# Patient Record
Sex: Female | Born: 1940 | Race: White | Hispanic: No | Marital: Married | Smoking: Former smoker
Health system: Southern US, Community
[De-identification: ages and names within clinical notes are randomized; demographics above are authoritative.]

## PROBLEM LIST (undated history)

## (undated) DIAGNOSIS — K219 Gastro-esophageal reflux disease without esophagitis: Secondary | ICD-10-CM

## (undated) DIAGNOSIS — I10 Essential (primary) hypertension: Secondary | ICD-10-CM

## (undated) DIAGNOSIS — J189 Pneumonia, unspecified organism: Secondary | ICD-10-CM

## (undated) DIAGNOSIS — M1990 Unspecified osteoarthritis, unspecified site: Secondary | ICD-10-CM

## (undated) DIAGNOSIS — D6859 Other primary thrombophilia: Secondary | ICD-10-CM

## (undated) DIAGNOSIS — K559 Vascular disorder of intestine, unspecified: Secondary | ICD-10-CM

## (undated) DIAGNOSIS — M818 Other osteoporosis without current pathological fracture: Principal | ICD-10-CM

## (undated) DIAGNOSIS — R112 Nausea with vomiting, unspecified: Secondary | ICD-10-CM

## (undated) DIAGNOSIS — T386X5A Adverse effect of antigonadotrophins, antiestrogens, antiandrogens, not elsewhere classified, initial encounter: Principal | ICD-10-CM

## (undated) DIAGNOSIS — S40029A Contusion of unspecified upper arm, initial encounter: Secondary | ICD-10-CM

## (undated) DIAGNOSIS — E785 Hyperlipidemia, unspecified: Secondary | ICD-10-CM

## (undated) DIAGNOSIS — R0600 Dyspnea, unspecified: Secondary | ICD-10-CM

## (undated) DIAGNOSIS — Z9889 Other specified postprocedural states: Secondary | ICD-10-CM

## (undated) DIAGNOSIS — Z86718 Personal history of other venous thrombosis and embolism: Secondary | ICD-10-CM

## (undated) DIAGNOSIS — Z7901 Long term (current) use of anticoagulants: Secondary | ICD-10-CM

## (undated) DIAGNOSIS — Z803 Family history of malignant neoplasm of breast: Secondary | ICD-10-CM

## (undated) DIAGNOSIS — C50919 Malignant neoplasm of unspecified site of unspecified female breast: Secondary | ICD-10-CM

## (undated) HISTORY — DX: Essential (primary) hypertension: I10

## (undated) HISTORY — DX: Personal history of other venous thrombosis and embolism: Z86.718

## (undated) HISTORY — PX: EYE SURGERY: SHX253

## (undated) HISTORY — DX: Dyspnea, unspecified: R06.00

## (undated) HISTORY — DX: Contusion of unspecified upper arm, initial encounter: S40.029A

## (undated) HISTORY — DX: Adverse effect of antigonadotrophins, antiestrogens, antiandrogens, not elsewhere classified, initial encounter: T38.6X5A

## (undated) HISTORY — DX: Hyperlipidemia, unspecified: E78.5

## (undated) HISTORY — PX: BREAST SURGERY: SHX581

## (undated) HISTORY — DX: Other primary thrombophilia: D68.59

## (undated) HISTORY — DX: Family history of malignant neoplasm of breast: Z80.3

## (undated) HISTORY — DX: Malignant neoplasm of unspecified site of unspecified female breast: C50.919

## (undated) HISTORY — DX: Long term (current) use of anticoagulants: Z79.01

## (undated) HISTORY — DX: Other osteoporosis without current pathological fracture: M81.8

---

## 1998-03-20 ENCOUNTER — Other Ambulatory Visit: Admission: RE | Admit: 1998-03-20 | Discharge: 1998-03-20 | Payer: Self-pay | Admitting: Obstetrics and Gynecology

## 1999-06-11 ENCOUNTER — Other Ambulatory Visit: Admission: RE | Admit: 1999-06-11 | Discharge: 1999-06-11 | Payer: Self-pay | Admitting: Obstetrics and Gynecology

## 2000-10-15 ENCOUNTER — Other Ambulatory Visit: Admission: RE | Admit: 2000-10-15 | Discharge: 2000-10-15 | Payer: Self-pay | Admitting: *Deleted

## 2001-10-18 ENCOUNTER — Other Ambulatory Visit: Admission: RE | Admit: 2001-10-18 | Discharge: 2001-10-18 | Payer: Self-pay | Admitting: *Deleted

## 2002-03-30 ENCOUNTER — Encounter: Admission: RE | Admit: 2002-03-30 | Discharge: 2002-03-30 | Payer: Self-pay | Admitting: Orthopaedic Surgery

## 2002-03-30 ENCOUNTER — Encounter: Payer: Self-pay | Admitting: Orthopaedic Surgery

## 2002-04-11 ENCOUNTER — Encounter: Admission: RE | Admit: 2002-04-11 | Discharge: 2002-04-11 | Payer: Self-pay | Admitting: Orthopaedic Surgery

## 2002-04-11 ENCOUNTER — Encounter: Payer: Self-pay | Admitting: Orthopaedic Surgery

## 2002-05-09 ENCOUNTER — Encounter: Admission: RE | Admit: 2002-05-09 | Discharge: 2002-05-09 | Payer: Self-pay | Admitting: Orthopaedic Surgery

## 2002-05-09 ENCOUNTER — Encounter: Payer: Self-pay | Admitting: Orthopaedic Surgery

## 2002-06-03 ENCOUNTER — Encounter (INDEPENDENT_AMBULATORY_CARE_PROVIDER_SITE_OTHER): Payer: Self-pay | Admitting: Specialist

## 2002-06-03 ENCOUNTER — Ambulatory Visit (HOSPITAL_COMMUNITY): Admission: RE | Admit: 2002-06-03 | Discharge: 2002-06-03 | Payer: Self-pay | Admitting: Gastroenterology

## 2002-10-24 ENCOUNTER — Other Ambulatory Visit: Admission: RE | Admit: 2002-10-24 | Discharge: 2002-10-24 | Payer: Self-pay | Admitting: *Deleted

## 2003-10-25 ENCOUNTER — Other Ambulatory Visit: Admission: RE | Admit: 2003-10-25 | Discharge: 2003-10-25 | Payer: Self-pay | Admitting: *Deleted

## 2004-03-31 HISTORY — PX: LUMBAR DISC SURGERY: SHX700

## 2005-08-15 ENCOUNTER — Ambulatory Visit (HOSPITAL_COMMUNITY): Admission: RE | Admit: 2005-08-15 | Discharge: 2005-08-16 | Payer: Self-pay | Admitting: Neurosurgery

## 2005-10-22 ENCOUNTER — Encounter: Admission: RE | Admit: 2005-10-22 | Discharge: 2005-10-22 | Payer: Self-pay | Admitting: Gastroenterology

## 2005-10-23 ENCOUNTER — Encounter: Admission: RE | Admit: 2005-10-23 | Discharge: 2005-10-23 | Payer: Self-pay | Admitting: Gastroenterology

## 2005-10-27 ENCOUNTER — Other Ambulatory Visit: Admission: RE | Admit: 2005-10-27 | Discharge: 2005-10-27 | Payer: Self-pay | Admitting: *Deleted

## 2006-11-03 ENCOUNTER — Other Ambulatory Visit: Admission: RE | Admit: 2006-11-03 | Discharge: 2006-11-03 | Payer: Self-pay | Admitting: *Deleted

## 2007-11-09 ENCOUNTER — Other Ambulatory Visit: Admission: RE | Admit: 2007-11-09 | Discharge: 2007-11-09 | Payer: Self-pay | Admitting: Gynecology

## 2008-04-07 ENCOUNTER — Encounter: Payer: Self-pay | Admitting: Neurosurgery

## 2008-04-08 ENCOUNTER — Inpatient Hospital Stay (HOSPITAL_COMMUNITY): Admission: EM | Admit: 2008-04-08 | Discharge: 2008-04-19 | Payer: Self-pay | Admitting: Emergency Medicine

## 2008-04-09 ENCOUNTER — Ambulatory Visit: Payer: Self-pay | Admitting: Infectious Diseases

## 2008-04-11 ENCOUNTER — Encounter (INDEPENDENT_AMBULATORY_CARE_PROVIDER_SITE_OTHER): Payer: Self-pay | Admitting: General Surgery

## 2008-04-11 HISTORY — PX: COLECTOMY: SHX59

## 2008-04-17 ENCOUNTER — Encounter (INDEPENDENT_AMBULATORY_CARE_PROVIDER_SITE_OTHER): Payer: Self-pay | Admitting: Internal Medicine

## 2008-04-18 ENCOUNTER — Ambulatory Visit: Payer: Self-pay | Admitting: Oncology

## 2008-04-20 ENCOUNTER — Ambulatory Visit: Payer: Self-pay | Admitting: Oncology

## 2008-05-08 ENCOUNTER — Emergency Department (HOSPITAL_COMMUNITY): Admission: EM | Admit: 2008-05-08 | Discharge: 2008-05-09 | Payer: Self-pay | Admitting: Emergency Medicine

## 2008-05-23 ENCOUNTER — Ambulatory Visit (HOSPITAL_COMMUNITY): Admission: RE | Admit: 2008-05-23 | Discharge: 2008-05-24 | Payer: Self-pay | Admitting: Neurosurgery

## 2008-05-23 HISTORY — PX: CERVICAL SPINE SURGERY: SHX589

## 2008-05-31 LAB — CBC WITH DIFFERENTIAL/PLATELET
BASO%: 0.6 % (ref 0.0–2.0)
Basophils Absolute: 0.1 10*3/uL (ref 0.0–0.1)
EOS%: 2.2 % (ref 0.0–7.0)
Eosinophils Absolute: 0.3 10*3/uL (ref 0.0–0.5)
HCT: 41.4 % (ref 34.8–46.6)
HGB: 14 g/dL (ref 11.6–15.9)
LYMPH%: 15.3 % (ref 14.0–49.7)
MCH: 28.1 pg (ref 25.1–34.0)
MCHC: 33.8 g/dL (ref 31.5–36.0)
MCV: 83.1 fL (ref 79.5–101.0)
MONO#: 0.3 10*3/uL (ref 0.1–0.9)
MONO%: 2.3 % (ref 0.0–14.0)
NEUT#: 9.7 10*3/uL — ABNORMAL HIGH (ref 1.5–6.5)
NEUT%: 79.6 % — ABNORMAL HIGH (ref 38.4–76.8)
Platelets: 589 10*3/uL — ABNORMAL HIGH (ref 145–400)
RBC: 4.98 10*6/uL (ref 3.70–5.45)
RDW: 14.8 % — ABNORMAL HIGH (ref 11.2–14.5)
WBC: 12.2 10*3/uL — ABNORMAL HIGH (ref 3.9–10.3)
lymph#: 1.9 10*3/uL (ref 0.9–3.3)

## 2008-05-31 LAB — MORPHOLOGY: PLT EST: INCREASED

## 2008-05-31 LAB — CHCC SMEAR

## 2008-05-31 LAB — ERYTHROCYTE SEDIMENTATION RATE: Sed Rate: 30 mm/hr (ref 0–30)

## 2008-06-02 LAB — PROTEIN S ACTIVITY: Protein S Activity: 108 % (ref 69–129)

## 2008-06-02 LAB — PROTEIN S, TOTAL: Protein S Ag, Total: 131 % (ref 70–140)

## 2008-06-02 LAB — PROTEIN S, ANTIGEN, FREE: Protein S Ag, Free: 109 % normal (ref 50–147)

## 2008-06-02 LAB — ANA: Anti Nuclear Antibody(ANA): NEGATIVE

## 2008-06-05 ENCOUNTER — Ambulatory Visit: Payer: Self-pay | Admitting: Oncology

## 2008-06-13 LAB — JAK2 GENOTYPR: JAK2 GenotypR: DETECTED

## 2008-06-19 LAB — CBC WITH DIFFERENTIAL/PLATELET
BASO%: 0.6 % (ref 0.0–2.0)
Basophils Absolute: 0.1 10*3/uL (ref 0.0–0.1)
EOS%: 2.2 % (ref 0.0–7.0)
Eosinophils Absolute: 0.2 10*3/uL (ref 0.0–0.5)
HCT: 40.6 % (ref 34.8–46.6)
HGB: 13.3 g/dL (ref 11.6–15.9)
LYMPH%: 24.9 % (ref 14.0–49.7)
MCH: 27 pg (ref 25.1–34.0)
MCHC: 32.8 g/dL (ref 31.5–36.0)
MCV: 82.4 fL (ref 79.5–101.0)
MONO#: 0.5 10*3/uL (ref 0.1–0.9)
MONO%: 5.9 % (ref 0.0–14.0)
NEUT#: 5.1 10*3/uL (ref 1.5–6.5)
NEUT%: 66.4 % (ref 38.4–76.8)
Platelets: 516 10*3/uL — ABNORMAL HIGH (ref 145–400)
RBC: 4.93 10*6/uL (ref 3.70–5.45)
RDW: 15.4 % — ABNORMAL HIGH (ref 11.2–14.5)
WBC: 7.8 10*3/uL (ref 3.9–10.3)
lymph#: 1.9 10*3/uL (ref 0.9–3.3)
nRBC: 0 % (ref 0–0)

## 2008-06-19 LAB — PROTIME-INR
INR: 2.2 (ref 2.00–3.50)
Protime: 26.4 Seconds — ABNORMAL HIGH (ref 10.6–13.4)

## 2008-06-26 LAB — PROTIME-INR
INR: 3.8 — ABNORMAL HIGH (ref 2.00–3.50)
Protime: 45.6 Seconds — ABNORMAL HIGH (ref 10.6–13.4)

## 2008-07-03 LAB — CBC WITH DIFFERENTIAL/PLATELET
BASO%: 0.9 % (ref 0.0–2.0)
Basophils Absolute: 0.1 10*3/uL (ref 0.0–0.1)
EOS%: 2.8 % (ref 0.0–7.0)
Eosinophils Absolute: 0.2 10*3/uL (ref 0.0–0.5)
HCT: 40.8 % (ref 34.8–46.6)
HGB: 13.7 g/dL (ref 11.6–15.9)
LYMPH%: 21.8 % (ref 14.0–49.7)
MCH: 28.1 pg (ref 25.1–34.0)
MCHC: 33.6 g/dL (ref 31.5–36.0)
MCV: 83.6 fL (ref 79.5–101.0)
MONO#: 0.5 10*3/uL (ref 0.1–0.9)
MONO%: 5.6 % (ref 0.0–14.0)
NEUT#: 5.5 10*3/uL (ref 1.5–6.5)
NEUT%: 68.9 % (ref 38.4–76.8)
Platelets: 439 10*3/uL — ABNORMAL HIGH (ref 145–400)
RBC: 4.88 10*6/uL (ref 3.70–5.45)
RDW: 15.5 % — ABNORMAL HIGH (ref 11.2–14.5)
WBC: 8 10*3/uL (ref 3.9–10.3)
lymph#: 1.7 10*3/uL (ref 0.9–3.3)
nRBC: 0 % (ref 0–0)

## 2008-07-03 LAB — PROTIME-INR
INR: 4.1 — ABNORMAL HIGH (ref 2.00–3.50)
Protime: 49.2 Seconds — ABNORMAL HIGH (ref 10.6–13.4)

## 2008-07-04 ENCOUNTER — Ambulatory Visit (HOSPITAL_COMMUNITY): Admission: RE | Admit: 2008-07-04 | Discharge: 2008-07-04 | Payer: Self-pay | Admitting: General Surgery

## 2008-07-07 LAB — PROTIME-INR
INR: 1.4 — ABNORMAL LOW (ref 2.00–3.50)
Protime: 16.8 Seconds — ABNORMAL HIGH (ref 10.6–13.4)

## 2008-07-11 LAB — PROTIME-INR
INR: 2.9 (ref 2.00–3.50)
Protime: 34.8 Seconds — ABNORMAL HIGH (ref 10.6–13.4)

## 2008-07-13 ENCOUNTER — Ambulatory Visit: Payer: Self-pay | Admitting: Oncology

## 2008-07-17 LAB — PROTIME-INR
INR: 1.7 — ABNORMAL LOW (ref 2.00–3.50)
Protime: 20.4 Seconds — ABNORMAL HIGH (ref 10.6–13.4)

## 2008-07-24 LAB — CBC WITH DIFFERENTIAL/PLATELET
BASO%: 0.4 % (ref 0.0–2.0)
Basophils Absolute: 0 10*3/uL (ref 0.0–0.1)
EOS%: 2.4 % (ref 0.0–7.0)
Eosinophils Absolute: 0.2 10*3/uL (ref 0.0–0.5)
HCT: 43 % (ref 34.8–46.6)
HGB: 14.4 g/dL (ref 11.6–15.9)
LYMPH%: 17.8 % (ref 14.0–49.7)
MCH: 28.1 pg (ref 25.1–34.0)
MCHC: 33.5 g/dL (ref 31.5–36.0)
MCV: 83.8 fL (ref 79.5–101.0)
MONO#: 0.3 10*3/uL (ref 0.1–0.9)
MONO%: 3.6 % (ref 0.0–14.0)
NEUT#: 6.1 10*3/uL (ref 1.5–6.5)
NEUT%: 75.8 % (ref 38.4–76.8)
Platelets: 494 10*3/uL — ABNORMAL HIGH (ref 145–400)
RBC: 5.13 10*6/uL (ref 3.70–5.45)
RDW: 14.6 % — ABNORMAL HIGH (ref 11.2–14.5)
WBC: 8.1 10*3/uL (ref 3.9–10.3)
lymph#: 1.4 10*3/uL (ref 0.9–3.3)

## 2008-07-24 LAB — MORPHOLOGY: PLT EST: ADEQUATE

## 2008-07-24 LAB — PROTIME-INR
INR: 1.8 — ABNORMAL LOW (ref 2.00–3.50)
Protime: 21.6 Seconds — ABNORMAL HIGH (ref 10.6–13.4)

## 2008-07-25 LAB — SEDIMENTATION RATE: Sed Rate: 3 mm/hr (ref 0–22)

## 2008-07-25 LAB — LEUKOCYTE ALKALINE PHOS: Leukocyte Alkaline  Phos Stain: 72 (ref 33–149)

## 2008-08-01 ENCOUNTER — Ambulatory Visit (HOSPITAL_COMMUNITY): Admission: RE | Admit: 2008-08-01 | Discharge: 2008-08-01 | Payer: Self-pay | Admitting: Gastroenterology

## 2008-08-07 LAB — PROTIME-INR
INR: 1.8 — ABNORMAL LOW (ref 2.00–3.50)
Protime: 21.6 Seconds — ABNORMAL HIGH (ref 10.6–13.4)

## 2008-08-11 LAB — CBC WITH DIFFERENTIAL/PLATELET
BASO%: 1.5 % (ref 0.0–2.0)
Basophils Absolute: 0.1 10*3/uL (ref 0.0–0.1)
EOS%: 3.6 % (ref 0.0–7.0)
Eosinophils Absolute: 0.2 10*3/uL (ref 0.0–0.5)
HCT: 42 % (ref 34.8–46.6)
HGB: 14 g/dL (ref 11.6–15.9)
LYMPH%: 27 % (ref 14.0–49.7)
MCH: 27.7 pg (ref 25.1–34.0)
MCHC: 33.3 g/dL (ref 31.5–36.0)
MCV: 83.2 fL (ref 79.5–101.0)
MONO#: 0.5 10*3/uL (ref 0.1–0.9)
MONO%: 7.4 % (ref 0.0–14.0)
NEUT#: 4 10*3/uL (ref 1.5–6.5)
NEUT%: 60.5 % (ref 38.4–76.8)
Platelets: 412 10*3/uL — ABNORMAL HIGH (ref 145–400)
RBC: 5.05 10*6/uL (ref 3.70–5.45)
RDW: 14.7 % — ABNORMAL HIGH (ref 11.2–14.5)
WBC: 6.6 10*3/uL (ref 3.9–10.3)
lymph#: 1.8 10*3/uL (ref 0.9–3.3)
nRBC: 0 % (ref 0–0)

## 2008-08-11 LAB — PROTIME-INR
INR: 1.3 — ABNORMAL LOW (ref 2.00–3.50)
Protime: 15.6 Seconds — ABNORMAL HIGH (ref 10.6–13.4)

## 2008-08-14 ENCOUNTER — Inpatient Hospital Stay (HOSPITAL_COMMUNITY): Admission: RE | Admit: 2008-08-14 | Discharge: 2008-08-22 | Payer: Self-pay | Admitting: General Surgery

## 2008-08-14 HISTORY — PX: COLOSTOMY TAKEDOWN: SHX5258

## 2008-08-15 ENCOUNTER — Ambulatory Visit: Payer: Self-pay | Admitting: Oncology

## 2008-08-25 LAB — CBC WITH DIFFERENTIAL/PLATELET
BASO%: 0.9 % (ref 0.0–2.0)
Basophils Absolute: 0.2 10*3/uL — ABNORMAL HIGH (ref 0.0–0.1)
EOS%: 2.5 % (ref 0.0–7.0)
Eosinophils Absolute: 0.4 10*3/uL (ref 0.0–0.5)
HCT: 37.1 % (ref 34.8–46.6)
HGB: 12.3 g/dL (ref 11.6–15.9)
LYMPH%: 13.2 % — ABNORMAL LOW (ref 14.0–49.7)
MCH: 27 pg (ref 25.1–34.0)
MCHC: 33.2 g/dL (ref 31.5–36.0)
MCV: 81.4 fL (ref 79.5–101.0)
MONO#: 0.9 10*3/uL (ref 0.1–0.9)
MONO%: 5.2 % (ref 0.0–14.0)
NEUT#: 13.4 10*3/uL — ABNORMAL HIGH (ref 1.5–6.5)
NEUT%: 78.2 % — ABNORMAL HIGH (ref 38.4–76.8)
Platelets: 728 10*3/uL — ABNORMAL HIGH (ref 145–400)
RBC: 4.56 10*6/uL (ref 3.70–5.45)
RDW: 14.3 % (ref 11.2–14.5)
WBC: 17.1 10*3/uL — ABNORMAL HIGH (ref 3.9–10.3)
lymph#: 2.3 10*3/uL (ref 0.9–3.3)
nRBC: 0 % (ref 0–0)

## 2008-08-25 LAB — PROTIME-INR
INR: 2.5 (ref 2.00–3.50)
Protime: 30 Seconds — ABNORMAL HIGH (ref 10.6–13.4)

## 2008-08-27 ENCOUNTER — Inpatient Hospital Stay (HOSPITAL_COMMUNITY): Admission: EM | Admit: 2008-08-27 | Discharge: 2008-08-28 | Payer: Self-pay | Admitting: Emergency Medicine

## 2008-08-29 ENCOUNTER — Ambulatory Visit: Payer: Self-pay | Admitting: Oncology

## 2008-08-29 LAB — CBC WITH DIFFERENTIAL/PLATELET
BASO%: 0.4 % (ref 0.0–2.0)
Basophils Absolute: 0.1 10*3/uL (ref 0.0–0.1)
EOS%: 3.7 % (ref 0.0–7.0)
Eosinophils Absolute: 0.4 10*3/uL (ref 0.0–0.5)
HCT: 33.4 % — ABNORMAL LOW (ref 34.8–46.6)
HGB: 11 g/dL — ABNORMAL LOW (ref 11.6–15.9)
LYMPH%: 16.7 % (ref 14.0–49.7)
MCH: 26.9 pg (ref 25.1–34.0)
MCHC: 32.9 g/dL (ref 31.5–36.0)
MCV: 81.7 fL (ref 79.5–101.0)
MONO#: 0.6 10*3/uL (ref 0.1–0.9)
MONO%: 5.7 % (ref 0.0–14.0)
NEUT#: 8.2 10*3/uL — ABNORMAL HIGH (ref 1.5–6.5)
NEUT%: 73.5 % (ref 38.4–76.8)
Platelets: 640 10*3/uL — ABNORMAL HIGH (ref 145–400)
RBC: 4.09 10*6/uL (ref 3.70–5.45)
RDW: 14.8 % — ABNORMAL HIGH (ref 11.2–14.5)
WBC: 11.2 10*3/uL — ABNORMAL HIGH (ref 3.9–10.3)
lymph#: 1.9 10*3/uL (ref 0.9–3.3)
nRBC: 0 % (ref 0–0)

## 2008-08-29 LAB — PROTIME-INR
INR: 3 (ref 2.00–3.50)
Protime: 36 Seconds — ABNORMAL HIGH (ref 10.6–13.4)

## 2008-09-01 LAB — PROTIME-INR
INR: 2.1 (ref 2.00–3.50)
Protime: 25.2 Seconds — ABNORMAL HIGH (ref 10.6–13.4)

## 2008-09-07 LAB — CBC WITH DIFFERENTIAL/PLATELET
BASO%: 0.6 % (ref 0.0–2.0)
Basophils Absolute: 0.1 10*3/uL (ref 0.0–0.1)
EOS%: 4.8 % (ref 0.0–7.0)
Eosinophils Absolute: 0.6 10*3/uL — ABNORMAL HIGH (ref 0.0–0.5)
HCT: 36.3 % (ref 34.8–46.6)
HGB: 11.8 g/dL (ref 11.6–15.9)
LYMPH%: 17.4 % (ref 14.0–49.7)
MCH: 27.3 pg (ref 25.1–34.0)
MCHC: 32.5 g/dL (ref 31.5–36.0)
MCV: 83.8 fL (ref 79.5–101.0)
MONO#: 0.7 10*3/uL (ref 0.1–0.9)
MONO%: 5.7 % (ref 0.0–14.0)
NEUT#: 8.4 10*3/uL — ABNORMAL HIGH (ref 1.5–6.5)
NEUT%: 71.5 % (ref 38.4–76.8)
Platelets: 570 10*3/uL — ABNORMAL HIGH (ref 145–400)
RBC: 4.33 10*6/uL (ref 3.70–5.45)
RDW: 15.5 % — ABNORMAL HIGH (ref 11.2–14.5)
WBC: 11.8 10*3/uL — ABNORMAL HIGH (ref 3.9–10.3)
lymph#: 2.1 10*3/uL (ref 0.9–3.3)
nRBC: 0 % (ref 0–0)

## 2008-09-07 LAB — PROTIME-INR
INR: 2.8 (ref 2.00–3.50)
Protime: 33.6 Seconds — ABNORMAL HIGH (ref 10.6–13.4)

## 2008-09-14 LAB — PROTIME-INR
INR: 3.5 (ref 2.00–3.50)
Protime: 42 Seconds — ABNORMAL HIGH (ref 10.6–13.4)

## 2008-09-25 LAB — PROTIME-INR
INR: 2 (ref 2.00–3.50)
Protime: 24 Seconds — ABNORMAL HIGH (ref 10.6–13.4)

## 2008-09-29 ENCOUNTER — Ambulatory Visit: Payer: Self-pay | Admitting: Oncology

## 2008-10-03 LAB — PROTIME-INR
INR: 2.2 (ref 2.00–3.50)
Protime: 26.4 Seconds — ABNORMAL HIGH (ref 10.6–13.4)

## 2008-10-12 LAB — PROTIME-INR
INR: 2.6 (ref 2.00–3.50)
Protime: 31.2 Seconds — ABNORMAL HIGH (ref 10.6–13.4)

## 2008-10-31 ENCOUNTER — Ambulatory Visit: Payer: Self-pay | Admitting: Oncology

## 2008-10-31 LAB — PROTIME-INR
INR: 2.9 (ref 2.00–3.50)
Protime: 34.8 Seconds — ABNORMAL HIGH (ref 10.6–13.4)

## 2008-11-30 ENCOUNTER — Ambulatory Visit: Payer: Self-pay | Admitting: Oncology

## 2008-11-30 LAB — CBC WITH DIFFERENTIAL/PLATELET
BASO%: 2 % (ref 0.0–2.0)
Basophils Absolute: 0.2 10*3/uL — ABNORMAL HIGH (ref 0.0–0.1)
EOS%: 2.2 % (ref 0.0–7.0)
Eosinophils Absolute: 0.2 10*3/uL (ref 0.0–0.5)
HCT: 38.5 % (ref 34.8–46.6)
HGB: 12.6 g/dL (ref 11.6–15.9)
LYMPH%: 24.4 % (ref 14.0–49.7)
MCH: 24.8 pg — ABNORMAL LOW (ref 25.1–34.0)
MCHC: 32.8 g/dL (ref 31.5–36.0)
MCV: 75.8 fL — ABNORMAL LOW (ref 79.5–101.0)
MONO#: 0.2 10*3/uL (ref 0.1–0.9)
MONO%: 2.5 % (ref 0.0–14.0)
NEUT#: 5.7 10*3/uL (ref 1.5–6.5)
NEUT%: 68.9 % (ref 38.4–76.8)
Platelets: 500 10*3/uL — ABNORMAL HIGH (ref 145–400)
RBC: 5.09 10*6/uL (ref 3.70–5.45)
RDW: 14.9 % — ABNORMAL HIGH (ref 11.2–14.5)
WBC: 8.3 10*3/uL (ref 3.9–10.3)
lymph#: 2 10*3/uL (ref 0.9–3.3)

## 2008-11-30 LAB — PROTIME-INR
INR: 1.7 — ABNORMAL LOW (ref 2.00–3.50)
Protime: 20.4 Seconds — ABNORMAL HIGH (ref 10.6–13.4)

## 2008-12-11 ENCOUNTER — Encounter: Admission: RE | Admit: 2008-12-11 | Discharge: 2008-12-11 | Payer: Self-pay | Admitting: General Surgery

## 2008-12-11 LAB — PROTIME-INR
INR: 2 (ref 2.00–3.50)
Protime: 24 Seconds — ABNORMAL HIGH (ref 10.6–13.4)

## 2008-12-12 ENCOUNTER — Ambulatory Visit: Payer: Self-pay | Admitting: Genetic Counselor

## 2008-12-13 LAB — CBC WITH DIFFERENTIAL/PLATELET
BASO%: 0.8 % (ref 0.0–2.0)
Basophils Absolute: 0.1 10*3/uL (ref 0.0–0.1)
EOS%: 2 % (ref 0.0–7.0)
Eosinophils Absolute: 0.2 10*3/uL (ref 0.0–0.5)
HCT: 39 % (ref 34.8–46.6)
HGB: 12.5 g/dL (ref 11.6–15.9)
LYMPH%: 26.3 % (ref 14.0–49.7)
MCH: 23.9 pg — ABNORMAL LOW (ref 25.1–34.0)
MCHC: 32.1 g/dL (ref 31.5–36.0)
MCV: 74.4 fL — ABNORMAL LOW (ref 79.5–101.0)
MONO#: 0.6 10*3/uL (ref 0.1–0.9)
MONO%: 7.9 % (ref 0.0–14.0)
NEUT#: 4.9 10*3/uL (ref 1.5–6.5)
NEUT%: 63 % (ref 38.4–76.8)
Platelets: 466 10*3/uL — ABNORMAL HIGH (ref 145–400)
RBC: 5.24 10*6/uL (ref 3.70–5.45)
RDW: 14.5 % (ref 11.2–14.5)
WBC: 7.7 10*3/uL (ref 3.9–10.3)
lymph#: 2 10*3/uL (ref 0.9–3.3)
nRBC: 0 % (ref 0–0)

## 2008-12-13 LAB — COMPREHENSIVE METABOLIC PANEL
ALT: 13 U/L (ref 0–35)
AST: 16 U/L (ref 0–37)
Albumin: 4 g/dL (ref 3.5–5.2)
Alkaline Phosphatase: 45 U/L (ref 39–117)
BUN: 19 mg/dL (ref 6–23)
CO2: 31 mEq/L (ref 19–32)
Calcium: 10.1 mg/dL (ref 8.4–10.5)
Chloride: 102 mEq/L (ref 96–112)
Creatinine, Ser: 0.9 mg/dL (ref 0.40–1.20)
Glucose, Bld: 81 mg/dL (ref 70–99)
Potassium: 3.2 mEq/L — ABNORMAL LOW (ref 3.5–5.3)
Sodium: 139 mEq/L (ref 135–145)
Total Bilirubin: 0.7 mg/dL (ref 0.3–1.2)
Total Protein: 6.8 g/dL (ref 6.0–8.3)

## 2008-12-13 LAB — ERYTHROCYTE SEDIMENTATION RATE: Sed Rate: 5 mm/hr (ref 0–30)

## 2008-12-13 LAB — LACTATE DEHYDROGENASE: LDH: 175 U/L (ref 94–250)

## 2008-12-13 LAB — PROTIME-INR
INR: 2.2 (ref 2.00–3.50)
Protime: 26.4 Seconds — ABNORMAL HIGH (ref 10.6–13.4)

## 2008-12-13 LAB — URIC ACID: Uric Acid, Serum: 5.4 mg/dL (ref 2.4–7.0)

## 2008-12-25 LAB — CBC WITH DIFFERENTIAL/PLATELET
BASO%: 1.4 % (ref 0.0–2.0)
Basophils Absolute: 0.1 10*3/uL (ref 0.0–0.1)
EOS%: 2.1 % (ref 0.0–7.0)
Eosinophils Absolute: 0.2 10*3/uL (ref 0.0–0.5)
HCT: 39.1 % (ref 34.8–46.6)
HGB: 12.7 g/dL (ref 11.6–15.9)
LYMPH%: 25.2 % (ref 14.0–49.7)
MCH: 23.8 pg — ABNORMAL LOW (ref 25.1–34.0)
MCHC: 32.5 g/dL (ref 31.5–36.0)
MCV: 73.2 fL — ABNORMAL LOW (ref 79.5–101.0)
MONO#: 0.6 10*3/uL (ref 0.1–0.9)
MONO%: 8.2 % (ref 0.0–14.0)
NEUT#: 4.8 10*3/uL (ref 1.5–6.5)
NEUT%: 63.1 % (ref 38.4–76.8)
Platelets: 487 10*3/uL — ABNORMAL HIGH (ref 145–400)
RBC: 5.34 10*6/uL (ref 3.70–5.45)
RDW: 14.9 % — ABNORMAL HIGH (ref 11.2–14.5)
WBC: 7.6 10*3/uL (ref 3.9–10.3)
lymph#: 1.9 10*3/uL (ref 0.9–3.3)
nRBC: 0 % (ref 0–0)

## 2008-12-25 LAB — PROTIME-INR
INR: 1.2 — ABNORMAL LOW (ref 2.00–3.50)
Protime: 14.4 Seconds — ABNORMAL HIGH (ref 10.6–13.4)

## 2008-12-26 ENCOUNTER — Ambulatory Visit (HOSPITAL_COMMUNITY): Admission: RE | Admit: 2008-12-26 | Discharge: 2008-12-26 | Payer: Self-pay | Admitting: General Surgery

## 2008-12-26 ENCOUNTER — Encounter (INDEPENDENT_AMBULATORY_CARE_PROVIDER_SITE_OTHER): Payer: Self-pay | Admitting: General Surgery

## 2008-12-28 LAB — CBC WITH DIFFERENTIAL/PLATELET
BASO%: 0.6 % (ref 0.0–2.0)
Basophils Absolute: 0.1 10*3/uL (ref 0.0–0.1)
EOS%: 4.3 % (ref 0.0–7.0)
Eosinophils Absolute: 0.4 10*3/uL (ref 0.0–0.5)
HCT: 38.1 % (ref 34.8–46.6)
HGB: 12.1 g/dL (ref 11.6–15.9)
LYMPH%: 20 % (ref 14.0–49.7)
MCH: 23.9 pg — ABNORMAL LOW (ref 25.1–34.0)
MCHC: 31.8 g/dL (ref 31.5–36.0)
MCV: 75.3 fL — ABNORMAL LOW (ref 79.5–101.0)
MONO#: 0.5 10*3/uL (ref 0.1–0.9)
MONO%: 5.5 % (ref 0.0–14.0)
NEUT#: 6 10*3/uL (ref 1.5–6.5)
NEUT%: 69.6 % (ref 38.4–76.8)
Platelets: 513 10*3/uL — ABNORMAL HIGH (ref 145–400)
RBC: 5.06 10*6/uL (ref 3.70–5.45)
RDW: 15 % — ABNORMAL HIGH (ref 11.2–14.5)
WBC: 8.6 10*3/uL (ref 3.9–10.3)
lymph#: 1.7 10*3/uL (ref 0.9–3.3)
nRBC: 0 % (ref 0–0)

## 2008-12-28 LAB — PROTIME-INR
INR: 1.2 — ABNORMAL LOW (ref 2.00–3.50)
Protime: 14.4 Seconds — ABNORMAL HIGH (ref 10.6–13.4)

## 2009-01-01 LAB — CBC WITH DIFFERENTIAL/PLATELET
BASO%: 0.8 % (ref 0.0–2.0)
Basophils Absolute: 0.1 10*3/uL (ref 0.0–0.1)
EOS%: 4.1 % (ref 0.0–7.0)
Eosinophils Absolute: 0.4 10*3/uL (ref 0.0–0.5)
HCT: 29.1 % — ABNORMAL LOW (ref 34.8–46.6)
HGB: 9.1 g/dL — ABNORMAL LOW (ref 11.6–15.9)
LYMPH%: 16.8 % (ref 14.0–49.7)
MCH: 23.6 pg — ABNORMAL LOW (ref 25.1–34.0)
MCHC: 31.3 g/dL — ABNORMAL LOW (ref 31.5–36.0)
MCV: 75.4 fL — ABNORMAL LOW (ref 79.5–101.0)
MONO#: 0.6 10*3/uL (ref 0.1–0.9)
MONO%: 5.7 % (ref 0.0–14.0)
NEUT#: 7.7 10*3/uL — ABNORMAL HIGH (ref 1.5–6.5)
NEUT%: 72.6 % (ref 38.4–76.8)
Platelets: 484 10*3/uL — ABNORMAL HIGH (ref 145–400)
RBC: 3.86 10*6/uL (ref 3.70–5.45)
RDW: 15.5 % — ABNORMAL HIGH (ref 11.2–14.5)
WBC: 10.6 10*3/uL — ABNORMAL HIGH (ref 3.9–10.3)
lymph#: 1.8 10*3/uL (ref 0.9–3.3)
nRBC: 0 % (ref 0–0)

## 2009-01-01 LAB — PROTIME-INR
INR: 1.8 — ABNORMAL LOW (ref 2.00–3.50)
Protime: 21.6 Seconds — ABNORMAL HIGH (ref 10.6–13.4)

## 2009-01-03 ENCOUNTER — Ambulatory Visit: Payer: Self-pay | Admitting: Genetic Counselor

## 2009-01-04 ENCOUNTER — Ambulatory Visit: Admission: RE | Admit: 2009-01-04 | Discharge: 2009-03-06 | Payer: Self-pay | Admitting: Radiation Oncology

## 2009-01-05 LAB — CBC WITH DIFFERENTIAL/PLATELET
BASO%: 1.1 % (ref 0.0–2.0)
Basophils Absolute: 0.1 10*3/uL (ref 0.0–0.1)
EOS%: 3.4 % (ref 0.0–7.0)
Eosinophils Absolute: 0.3 10*3/uL (ref 0.0–0.5)
HCT: 28.1 % — ABNORMAL LOW (ref 34.8–46.6)
HGB: 8.8 g/dL — ABNORMAL LOW (ref 11.6–15.9)
LYMPH%: 21.9 % (ref 14.0–49.7)
MCH: 23.7 pg — ABNORMAL LOW (ref 25.1–34.0)
MCHC: 31.3 g/dL — ABNORMAL LOW (ref 31.5–36.0)
MCV: 75.7 fL — ABNORMAL LOW (ref 79.5–101.0)
MONO#: 0.5 10*3/uL (ref 0.1–0.9)
MONO%: 5.7 % (ref 0.0–14.0)
NEUT#: 5.6 10*3/uL (ref 1.5–6.5)
NEUT%: 67.9 % (ref 38.4–76.8)
Platelets: 560 10*3/uL — ABNORMAL HIGH (ref 145–400)
RBC: 3.71 10*6/uL (ref 3.70–5.45)
RDW: 16.5 % — ABNORMAL HIGH (ref 11.2–14.5)
WBC: 8.3 10*3/uL (ref 3.9–10.3)
lymph#: 1.8 10*3/uL (ref 0.9–3.3)
nRBC: 0 % (ref 0–0)

## 2009-01-05 LAB — PROTIME-INR
INR: 1 — ABNORMAL LOW (ref 2.00–3.50)
Protime: 12 Seconds (ref 10.6–13.4)

## 2009-01-09 LAB — CBC WITH DIFFERENTIAL/PLATELET
BASO%: 0.9 % (ref 0.0–2.0)
Basophils Absolute: 0.1 10*3/uL (ref 0.0–0.1)
EOS%: 3.3 % (ref 0.0–7.0)
Eosinophils Absolute: 0.3 10*3/uL (ref 0.0–0.5)
HCT: 30.6 % — ABNORMAL LOW (ref 34.8–46.6)
HGB: 9.5 g/dL — ABNORMAL LOW (ref 11.6–15.9)
LYMPH%: 21.8 % (ref 14.0–49.7)
MCH: 24.1 pg — ABNORMAL LOW (ref 25.1–34.0)
MCHC: 31 g/dL — ABNORMAL LOW (ref 31.5–36.0)
MCV: 77.5 fL — ABNORMAL LOW (ref 79.5–101.0)
MONO#: 0.4 10*3/uL (ref 0.1–0.9)
MONO%: 4.4 % (ref 0.0–14.0)
NEUT#: 6.4 10*3/uL (ref 1.5–6.5)
NEUT%: 69.6 % (ref 38.4–76.8)
Platelets: 495 10*3/uL — ABNORMAL HIGH (ref 145–400)
RBC: 3.95 10*6/uL (ref 3.70–5.45)
RDW: 17.8 % — ABNORMAL HIGH (ref 11.2–14.5)
WBC: 9.2 10*3/uL (ref 3.9–10.3)
lymph#: 2 10*3/uL (ref 0.9–3.3)
nRBC: 0 % (ref 0–0)

## 2009-01-09 LAB — PROTIME-INR
INR: 1.4 — ABNORMAL LOW (ref 2.00–3.50)
Protime: 16.8 Seconds — ABNORMAL HIGH (ref 10.6–13.4)

## 2009-01-16 ENCOUNTER — Ambulatory Visit: Payer: Self-pay | Admitting: Oncology

## 2009-01-16 LAB — CBC WITH DIFFERENTIAL/PLATELET
BASO%: 1 % (ref 0.0–2.0)
Basophils Absolute: 0.1 10*3/uL (ref 0.0–0.1)
EOS%: 3.2 % (ref 0.0–7.0)
Eosinophils Absolute: 0.2 10*3/uL (ref 0.0–0.5)
HCT: 33.5 % — ABNORMAL LOW (ref 34.8–46.6)
HGB: 10.3 g/dL — ABNORMAL LOW (ref 11.6–15.9)
LYMPH%: 26.5 % (ref 14.0–49.7)
MCH: 24.3 pg — ABNORMAL LOW (ref 25.1–34.0)
MCHC: 30.7 g/dL — ABNORMAL LOW (ref 31.5–36.0)
MCV: 79 fL — ABNORMAL LOW (ref 79.5–101.0)
MONO#: 0.4 10*3/uL (ref 0.1–0.9)
MONO%: 5.8 % (ref 0.0–14.0)
NEUT#: 4.6 10*3/uL (ref 1.5–6.5)
NEUT%: 63.5 % (ref 38.4–76.8)
Platelets: 461 10*3/uL — ABNORMAL HIGH (ref 145–400)
RBC: 4.24 10*6/uL (ref 3.70–5.45)
RDW: 19.1 % — ABNORMAL HIGH (ref 11.2–14.5)
WBC: 7.3 10*3/uL (ref 3.9–10.3)
lymph#: 1.9 10*3/uL (ref 0.9–3.3)

## 2009-01-16 LAB — PROTIME-INR
INR: 2 (ref 2.00–3.50)
Protime: 24 Seconds — ABNORMAL HIGH (ref 10.6–13.4)

## 2009-01-16 LAB — BASIC METABOLIC PANEL
BUN: 25 mg/dL — ABNORMAL HIGH (ref 6–23)
CO2: 28 mEq/L (ref 19–32)
Calcium: 9.9 mg/dL (ref 8.4–10.5)
Chloride: 101 mEq/L (ref 96–112)
Creatinine, Ser: 0.88 mg/dL (ref 0.40–1.20)
Glucose, Bld: 86 mg/dL (ref 70–99)
Potassium: 3.6 mEq/L (ref 3.5–5.3)
Sodium: 137 mEq/L (ref 135–145)

## 2009-01-31 LAB — CBC WITH DIFFERENTIAL/PLATELET
BASO%: 1 % (ref 0.0–2.0)
Basophils Absolute: 0.1 10*3/uL (ref 0.0–0.1)
EOS%: 2.7 % (ref 0.0–7.0)
Eosinophils Absolute: 0.2 10*3/uL (ref 0.0–0.5)
HCT: 38.9 % (ref 34.8–46.6)
HGB: 12 g/dL (ref 11.6–15.9)
LYMPH%: 23.6 % (ref 14.0–49.7)
MCH: 24.1 pg — ABNORMAL LOW (ref 25.1–34.0)
MCHC: 30.8 g/dL — ABNORMAL LOW (ref 31.5–36.0)
MCV: 78.1 fL — ABNORMAL LOW (ref 79.5–101.0)
MONO#: 0.4 10*3/uL (ref 0.1–0.9)
MONO%: 5.4 % (ref 0.0–14.0)
NEUT#: 4.8 10*3/uL (ref 1.5–6.5)
NEUT%: 67.3 % (ref 38.4–76.8)
Platelets: 445 10*3/uL — ABNORMAL HIGH (ref 145–400)
RBC: 4.98 10*6/uL (ref 3.70–5.45)
RDW: 17.9 % — ABNORMAL HIGH (ref 11.2–14.5)
WBC: 7.1 10*3/uL (ref 3.9–10.3)
lymph#: 1.7 10*3/uL (ref 0.9–3.3)

## 2009-01-31 LAB — PROTIME-INR
INR: 2 (ref 2.00–3.50)
Protime: 24 Seconds — ABNORMAL HIGH (ref 10.6–13.4)

## 2009-02-16 ENCOUNTER — Ambulatory Visit: Payer: Self-pay | Admitting: Oncology

## 2009-02-16 LAB — PROTIME-INR
INR: 2 (ref 2.00–3.50)
Protime: 24 Seconds — ABNORMAL HIGH (ref 10.6–13.4)

## 2009-02-21 LAB — CBC WITH DIFFERENTIAL/PLATELET
BASO%: 2 % (ref 0.0–2.0)
Basophils Absolute: 0.1 10*3/uL (ref 0.0–0.1)
EOS%: 1.7 % (ref 0.0–7.0)
Eosinophils Absolute: 0.1 10*3/uL (ref 0.0–0.5)
HCT: 37.2 % (ref 34.8–46.6)
HGB: 11.7 g/dL (ref 11.6–15.9)
LYMPH%: 31.8 % (ref 14.0–49.7)
MCH: 23.6 pg — ABNORMAL LOW (ref 25.1–34.0)
MCHC: 31.5 g/dL (ref 31.5–36.0)
MCV: 75 fL — ABNORMAL LOW (ref 79.5–101.0)
MONO#: 0.3 10*3/uL (ref 0.1–0.9)
MONO%: 8.7 % (ref 0.0–14.0)
NEUT#: 2 10*3/uL (ref 1.5–6.5)
NEUT%: 55.8 % (ref 38.4–76.8)
Platelets: 327 10*3/uL (ref 145–400)
RBC: 4.96 10*6/uL (ref 3.70–5.45)
RDW: 16.1 % — ABNORMAL HIGH (ref 11.2–14.5)
WBC: 3.6 10*3/uL — ABNORMAL LOW (ref 3.9–10.3)
lymph#: 1.1 10*3/uL (ref 0.9–3.3)
nRBC: 0 % (ref 0–0)

## 2009-02-21 LAB — PROTIME-INR
INR: 2.6 (ref 2.00–3.50)
Protime: 31.2 Seconds — ABNORMAL HIGH (ref 10.6–13.4)

## 2009-03-09 LAB — COMPREHENSIVE METABOLIC PANEL
ALT: 17 U/L (ref 0–35)
AST: 19 U/L (ref 0–37)
Albumin: 3.9 g/dL (ref 3.5–5.2)
Alkaline Phosphatase: 66 U/L (ref 39–117)
BUN: 21 mg/dL (ref 6–23)
CO2: 27 mEq/L (ref 19–32)
Calcium: 10.2 mg/dL (ref 8.4–10.5)
Chloride: 102 mEq/L (ref 96–112)
Creatinine, Ser: 0.86 mg/dL (ref 0.40–1.20)
Glucose, Bld: 100 mg/dL — ABNORMAL HIGH (ref 70–99)
Potassium: 3.7 mEq/L (ref 3.5–5.3)
Sodium: 138 mEq/L (ref 135–145)
Total Bilirubin: 0.6 mg/dL (ref 0.3–1.2)
Total Protein: 7 g/dL (ref 6.0–8.3)

## 2009-03-09 LAB — CBC WITH DIFFERENTIAL/PLATELET
BASO%: 0.4 % (ref 0.0–2.0)
Basophils Absolute: 0 10*3/uL (ref 0.0–0.1)
EOS%: 0 % (ref 0.0–7.0)
Eosinophils Absolute: 0 10*3/uL (ref 0.0–0.5)
HCT: 33.8 % — ABNORMAL LOW (ref 34.8–46.6)
HGB: 10.7 g/dL — ABNORMAL LOW (ref 11.6–15.9)
LYMPH%: 11.1 % — ABNORMAL LOW (ref 14.0–49.7)
MCH: 23.9 pg — ABNORMAL LOW (ref 25.1–34.0)
MCHC: 31.7 g/dL (ref 31.5–36.0)
MCV: 75.4 fL — ABNORMAL LOW (ref 79.5–101.0)
MONO#: 0.7 10*3/uL (ref 0.1–0.9)
MONO%: 6.8 % (ref 0.0–14.0)
NEUT#: 8.4 10*3/uL — ABNORMAL HIGH (ref 1.5–6.5)
NEUT%: 81.7 % — ABNORMAL HIGH (ref 38.4–76.8)
Platelets: 647 10*3/uL — ABNORMAL HIGH (ref 145–400)
RBC: 4.48 10*6/uL (ref 3.70–5.45)
RDW: 18.9 % — ABNORMAL HIGH (ref 11.2–14.5)
WBC: 10.2 10*3/uL (ref 3.9–10.3)
lymph#: 1.1 10*3/uL (ref 0.9–3.3)
nRBC: 0 % (ref 0–0)

## 2009-03-09 LAB — PROTIME-INR
INR: 2.1 (ref 2.00–3.50)
Protime: 25.2 Seconds — ABNORMAL HIGH (ref 10.6–13.4)

## 2009-03-13 LAB — CBC WITH DIFFERENTIAL/PLATELET
BASO%: 2.4 % — ABNORMAL HIGH (ref 0.0–2.0)
Basophils Absolute: 0.3 10*3/uL — ABNORMAL HIGH (ref 0.0–0.1)
EOS%: 2.9 % (ref 0.0–7.0)
Eosinophils Absolute: 0.4 10*3/uL (ref 0.0–0.5)
HCT: 36.5 % (ref 34.8–46.6)
HGB: 11.5 g/dL — ABNORMAL LOW (ref 11.6–15.9)
LYMPH%: 9.6 % — ABNORMAL LOW (ref 14.0–49.7)
MCH: 24.3 pg — ABNORMAL LOW (ref 25.1–34.0)
MCHC: 31.5 g/dL (ref 31.5–36.0)
MCV: 77 fL — ABNORMAL LOW (ref 79.5–101.0)
MONO#: 0.2 10*3/uL (ref 0.1–0.9)
MONO%: 1.4 % (ref 0.0–14.0)
NEUT#: 11.1 10*3/uL — ABNORMAL HIGH (ref 1.5–6.5)
NEUT%: 83.7 % — ABNORMAL HIGH (ref 38.4–76.8)
Platelets: 503 10*3/uL — ABNORMAL HIGH (ref 145–400)
RBC: 4.74 10*6/uL (ref 3.70–5.45)
RDW: 19.2 % — ABNORMAL HIGH (ref 11.2–14.5)
WBC: 13.3 10*3/uL — ABNORMAL HIGH (ref 3.9–10.3)
lymph#: 1.3 10*3/uL (ref 0.9–3.3)

## 2009-03-13 LAB — PROTIME-INR
INR: 3.3 (ref 2.00–3.50)
Protime: 39.6 Seconds — ABNORMAL HIGH (ref 10.6–13.4)

## 2009-03-13 LAB — CHCC SMEAR

## 2009-03-19 ENCOUNTER — Ambulatory Visit: Payer: Self-pay | Admitting: Oncology

## 2009-03-19 LAB — COMPREHENSIVE METABOLIC PANEL
ALT: 42 U/L — ABNORMAL HIGH (ref 0–35)
AST: 28 U/L (ref 0–37)
Albumin: 3.8 g/dL (ref 3.5–5.2)
Alkaline Phosphatase: 172 U/L — ABNORMAL HIGH (ref 39–117)
BUN: 16 mg/dL (ref 6–23)
CO2: 27 mEq/L (ref 19–32)
Calcium: 9.3 mg/dL (ref 8.4–10.5)
Chloride: 100 mEq/L (ref 96–112)
Creatinine, Ser: 0.98 mg/dL (ref 0.40–1.20)
Glucose, Bld: 87 mg/dL (ref 70–99)
Potassium: 3.5 mEq/L (ref 3.5–5.3)
Sodium: 134 mEq/L — ABNORMAL LOW (ref 135–145)
Total Bilirubin: 0.5 mg/dL (ref 0.3–1.2)
Total Protein: 6 g/dL (ref 6.0–8.3)

## 2009-03-19 LAB — CBC WITH DIFFERENTIAL/PLATELET
BASO%: 0.9 % (ref 0.0–2.0)
Basophils Absolute: 0.5 10*3/uL — ABNORMAL HIGH (ref 0.0–0.1)
EOS%: 0.3 % (ref 0.0–7.0)
Eosinophils Absolute: 0.2 10*3/uL (ref 0.0–0.5)
HCT: 33.7 % — ABNORMAL LOW (ref 34.8–46.6)
HGB: 10.7 g/dL — ABNORMAL LOW (ref 11.6–15.9)
LYMPH%: 5.5 % — ABNORMAL LOW (ref 14.0–49.7)
MCH: 24.8 pg — ABNORMAL LOW (ref 25.1–34.0)
MCHC: 31.8 g/dL (ref 31.5–36.0)
MCV: 78 fL — ABNORMAL LOW (ref 79.5–101.0)
MONO#: 2.2 10*3/uL — ABNORMAL HIGH (ref 0.1–0.9)
MONO%: 4.2 % (ref 0.0–14.0)
NEUT#: 46.3 10*3/uL — ABNORMAL HIGH (ref 1.5–6.5)
NEUT%: 89.1 % — ABNORMAL HIGH (ref 38.4–76.8)
Platelets: 312 10*3/uL (ref 145–400)
RBC: 4.32 10*6/uL (ref 3.70–5.45)
RDW: 20.5 % — ABNORMAL HIGH (ref 11.2–14.5)
WBC: 51.9 10*3/uL (ref 3.9–10.3)
lymph#: 2.8 10*3/uL (ref 0.9–3.3)

## 2009-03-19 LAB — PROTIME-INR
INR: 4.4 — ABNORMAL HIGH (ref 2.00–3.50)
Protime: 52.8 Seconds — ABNORMAL HIGH (ref 10.6–13.4)

## 2009-03-22 LAB — PROTIME-INR
INR: 1.2 — ABNORMAL LOW (ref 2.00–3.50)
Protime: 14.4 Seconds — ABNORMAL HIGH (ref 10.6–13.4)

## 2009-03-29 LAB — COMPREHENSIVE METABOLIC PANEL
ALT: 15 U/L (ref 0–35)
AST: 13 U/L (ref 0–37)
Albumin: 4.6 g/dL (ref 3.5–5.2)
Alkaline Phosphatase: 71 U/L (ref 39–117)
BUN: 28 mg/dL — ABNORMAL HIGH (ref 6–23)
CO2: 21 mEq/L (ref 19–32)
Calcium: 9.9 mg/dL (ref 8.4–10.5)
Chloride: 102 mEq/L (ref 96–112)
Creatinine, Ser: 0.91 mg/dL (ref 0.40–1.20)
Glucose, Bld: 88 mg/dL (ref 70–99)
Potassium: 4 mEq/L (ref 3.5–5.3)
Sodium: 139 mEq/L (ref 135–145)
Total Bilirubin: 0.4 mg/dL (ref 0.3–1.2)
Total Protein: 6.8 g/dL (ref 6.0–8.3)

## 2009-03-29 LAB — CBC WITH DIFFERENTIAL/PLATELET
BASO%: 0.1 % (ref 0.0–2.0)
Basophils Absolute: 0 10*3/uL (ref 0.0–0.1)
EOS%: 0 % (ref 0.0–7.0)
Eosinophils Absolute: 0 10*3/uL (ref 0.0–0.5)
HCT: 31.8 % — ABNORMAL LOW (ref 34.8–46.6)
HGB: 10.2 g/dL — ABNORMAL LOW (ref 11.6–15.9)
LYMPH%: 8.1 % — ABNORMAL LOW (ref 14.0–49.7)
MCH: 24.8 pg — ABNORMAL LOW (ref 25.1–34.0)
MCHC: 32.1 g/dL (ref 31.5–36.0)
MCV: 77.2 fL — ABNORMAL LOW (ref 79.5–101.0)
MONO#: 0.3 10*3/uL (ref 0.1–0.9)
MONO%: 3.2 % (ref 0.0–14.0)
NEUT#: 8.5 10*3/uL — ABNORMAL HIGH (ref 1.5–6.5)
NEUT%: 88.6 % — ABNORMAL HIGH (ref 38.4–76.8)
Platelets: 332 10*3/uL (ref 145–400)
RBC: 4.12 10*6/uL (ref 3.70–5.45)
RDW: 21.5 % — ABNORMAL HIGH (ref 11.2–14.5)
WBC: 9.6 10*3/uL (ref 3.9–10.3)
lymph#: 0.8 10*3/uL — ABNORMAL LOW (ref 0.9–3.3)

## 2009-03-29 LAB — PROTIME-INR
INR: 1.9 — ABNORMAL LOW (ref 2.00–3.50)
Protime: 22.8 Seconds — ABNORMAL HIGH (ref 10.6–13.4)

## 2009-03-31 HISTORY — PX: HERNIA REPAIR: SHX51

## 2009-04-06 LAB — MANUAL DIFFERENTIAL
ALC: 4.4 10*3/uL — ABNORMAL HIGH (ref 0.9–3.3)
ANC (CHCC manual diff): 24.1 10*3/uL — ABNORMAL HIGH (ref 1.5–6.5)
Band Neutrophils: 34 % — ABNORMAL HIGH (ref 0–10)
Basophil: 1 % (ref 0–2)
Blasts: 0 % (ref 0–0)
EOS: 3 % (ref 0–7)
LYMPH: 14 % (ref 14–49)
MONO: 6 % (ref 0–14)
Metamyelocytes: 9 % — ABNORMAL HIGH (ref 0–0)
Myelocytes: 3 % — ABNORMAL HIGH (ref 0–0)
Other Cell: 0 % (ref 0–0)
PLT EST: ADEQUATE
PROMYELO: 0 % (ref 0–0)
SEG: 30 % — ABNORMAL LOW (ref 38–77)
Variant Lymph: 0 % (ref 0–0)
nRBC: 0 % (ref 0–0)

## 2009-04-06 LAB — CBC WITH DIFFERENTIAL/PLATELET
HCT: 31.4 % — ABNORMAL LOW (ref 34.8–46.6)
HGB: 10.1 g/dL — ABNORMAL LOW (ref 11.6–15.9)
MCH: 25.2 pg (ref 25.1–34.0)
MCHC: 32.2 g/dL (ref 31.5–36.0)
MCV: 78.3 fL — ABNORMAL LOW (ref 79.5–101.0)
Platelets: 311 10*3/uL (ref 145–400)
RBC: 4.01 10*6/uL (ref 3.70–5.45)
RDW: 22 % — ABNORMAL HIGH (ref 11.2–14.5)
WBC: 31.7 10*3/uL — ABNORMAL HIGH (ref 3.9–10.3)
nRBC: 0 % (ref 0–0)

## 2009-04-06 LAB — PROTIME-INR
INR: 2.7 (ref 2.00–3.50)
Protime: 32.4 Seconds — ABNORMAL HIGH (ref 10.6–13.4)

## 2009-04-20 ENCOUNTER — Ambulatory Visit: Payer: Self-pay | Admitting: Oncology

## 2009-04-20 LAB — COMPREHENSIVE METABOLIC PANEL
ALT: 14 U/L (ref 0–35)
AST: 15 U/L (ref 0–37)
Albumin: 4.1 g/dL (ref 3.5–5.2)
Alkaline Phosphatase: 59 U/L (ref 39–117)
BUN: 25 mg/dL — ABNORMAL HIGH (ref 6–23)
CO2: 29 mEq/L (ref 19–32)
Calcium: 10.4 mg/dL (ref 8.4–10.5)
Chloride: 101 mEq/L (ref 96–112)
Creatinine, Ser: 0.85 mg/dL (ref 0.40–1.20)
Glucose, Bld: 102 mg/dL — ABNORMAL HIGH (ref 70–99)
Potassium: 3.5 mEq/L (ref 3.5–5.3)
Sodium: 138 mEq/L (ref 135–145)
Total Bilirubin: 0.6 mg/dL (ref 0.3–1.2)
Total Protein: 7 g/dL (ref 6.0–8.3)

## 2009-04-20 LAB — CBC WITH DIFFERENTIAL/PLATELET
BASO%: 0.7 % (ref 0.0–2.0)
Basophils Absolute: 0 10*3/uL (ref 0.0–0.1)
EOS%: 0.1 % (ref 0.0–7.0)
Eosinophils Absolute: 0 10*3/uL (ref 0.0–0.5)
HCT: 30.2 % — ABNORMAL LOW (ref 34.8–46.6)
HGB: 10 g/dL — ABNORMAL LOW (ref 11.6–15.9)
LYMPH%: 11.7 % — ABNORMAL LOW (ref 14.0–49.7)
MCH: 26.4 pg (ref 25.1–34.0)
MCHC: 33.3 g/dL (ref 31.5–36.0)
MCV: 79.3 fL — ABNORMAL LOW (ref 79.5–101.0)
MONO#: 0.5 10*3/uL (ref 0.1–0.9)
MONO%: 7.1 % (ref 0.0–14.0)
NEUT#: 5.2 10*3/uL (ref 1.5–6.5)
NEUT%: 80.4 % — ABNORMAL HIGH (ref 38.4–76.8)
Platelets: 378 10*3/uL (ref 145–400)
RBC: 3.8 10*6/uL (ref 3.70–5.45)
RDW: 25.5 % — ABNORMAL HIGH (ref 11.2–14.5)
WBC: 6.5 10*3/uL (ref 3.9–10.3)
lymph#: 0.8 10*3/uL — ABNORMAL LOW (ref 0.9–3.3)

## 2009-04-20 LAB — PROTIME-INR
INR: 1.8 — ABNORMAL LOW (ref 2.00–3.50)
Protime: 21.6 Seconds — ABNORMAL HIGH (ref 10.6–13.4)

## 2009-04-26 ENCOUNTER — Ambulatory Visit: Admission: RE | Admit: 2009-04-26 | Discharge: 2009-07-03 | Payer: Self-pay | Admitting: Radiation Oncology

## 2009-04-26 LAB — COMPREHENSIVE METABOLIC PANEL
ALT: 22 U/L (ref 0–35)
AST: 17 U/L (ref 0–37)
Albumin: 4.1 g/dL (ref 3.5–5.2)
Alkaline Phosphatase: 73 U/L (ref 39–117)
BUN: 16 mg/dL (ref 6–23)
CO2: 29 mEq/L (ref 19–32)
Calcium: 9.7 mg/dL (ref 8.4–10.5)
Chloride: 100 mEq/L (ref 96–112)
Creatinine, Ser: 0.94 mg/dL (ref 0.40–1.20)
Glucose, Bld: 77 mg/dL (ref 70–99)
Potassium: 4 mEq/L (ref 3.5–5.3)
Sodium: 139 mEq/L (ref 135–145)
Total Bilirubin: 0.5 mg/dL (ref 0.3–1.2)
Total Protein: 6 g/dL (ref 6.0–8.3)

## 2009-04-26 LAB — CBC WITH DIFFERENTIAL/PLATELET
BASO%: 1.3 % (ref 0.0–2.0)
Basophils Absolute: 0.1 10*3/uL (ref 0.0–0.1)
EOS%: 3.8 % (ref 0.0–7.0)
Eosinophils Absolute: 0.1 10*3/uL (ref 0.0–0.5)
HCT: 32 % — ABNORMAL LOW (ref 34.8–46.6)
HGB: 10 g/dL — ABNORMAL LOW (ref 11.6–15.9)
LYMPH%: 14.8 % (ref 14.0–49.7)
MCH: 25.6 pg (ref 25.1–34.0)
MCHC: 31.3 g/dL — ABNORMAL LOW (ref 31.5–36.0)
MCV: 81.8 fL (ref 79.5–101.0)
MONO#: 0.4 10*3/uL (ref 0.1–0.9)
MONO%: 11.3 % (ref 0.0–14.0)
NEUT#: 2.6 10*3/uL (ref 1.5–6.5)
NEUT%: 68.8 % (ref 38.4–76.8)
Platelets: 260 10*3/uL (ref 145–400)
RBC: 3.91 10*6/uL (ref 3.70–5.45)
RDW: 22.6 % — ABNORMAL HIGH (ref 11.2–14.5)
WBC: 3.7 10*3/uL — ABNORMAL LOW (ref 3.9–10.3)
lymph#: 0.6 10*3/uL — ABNORMAL LOW (ref 0.9–3.3)
nRBC: 0 % (ref 0–0)

## 2009-04-26 LAB — PROTIME-INR
INR: 2 (ref 2.00–3.50)
Protime: 24 Seconds — ABNORMAL HIGH (ref 10.6–13.4)

## 2009-05-08 LAB — PROTIME-INR
INR: 2.2 (ref 2.00–3.50)
Protime: 26.4 Seconds — ABNORMAL HIGH (ref 10.6–13.4)

## 2009-06-01 ENCOUNTER — Ambulatory Visit: Payer: Self-pay | Admitting: Oncology

## 2009-06-05 LAB — PROTIME-INR
INR: 1.9 — ABNORMAL LOW (ref 2.00–3.50)
Protime: 22.8 Seconds — ABNORMAL HIGH (ref 10.6–13.4)

## 2009-06-13 LAB — PROTIME-INR
INR: 2 (ref 2.00–3.50)
Protime: 24 Seconds — ABNORMAL HIGH (ref 10.6–13.4)

## 2009-06-27 LAB — CBC WITH DIFFERENTIAL/PLATELET
BASO%: 0.8 % (ref 0.0–2.0)
Basophils Absolute: 0 10*3/uL (ref 0.0–0.1)
EOS%: 3 % (ref 0.0–7.0)
Eosinophils Absolute: 0.2 10*3/uL (ref 0.0–0.5)
HCT: 34.6 % — ABNORMAL LOW (ref 34.8–46.6)
HGB: 10.8 g/dL — ABNORMAL LOW (ref 11.6–15.9)
LYMPH%: 19.6 % (ref 14.0–49.7)
MCH: 24.9 pg — ABNORMAL LOW (ref 25.1–34.0)
MCHC: 31.2 g/dL — ABNORMAL LOW (ref 31.5–36.0)
MCV: 79.9 fL (ref 79.5–101.0)
MONO#: 0.4 10*3/uL (ref 0.1–0.9)
MONO%: 7.4 % (ref 0.0–14.0)
NEUT#: 3.5 10*3/uL (ref 1.5–6.5)
NEUT%: 69.2 % (ref 38.4–76.8)
Platelets: 322 10*3/uL (ref 145–400)
RBC: 4.33 10*6/uL (ref 3.70–5.45)
RDW: 14.5 % (ref 11.2–14.5)
WBC: 5 10*3/uL (ref 3.9–10.3)
lymph#: 1 10*3/uL (ref 0.9–3.3)

## 2009-06-27 LAB — COMPREHENSIVE METABOLIC PANEL
ALT: 11 U/L (ref 0–35)
AST: 13 U/L (ref 0–37)
Albumin: 4.3 g/dL (ref 3.5–5.2)
Alkaline Phosphatase: 56 U/L (ref 39–117)
BUN: 19 mg/dL (ref 6–23)
CO2: 28 mEq/L (ref 19–32)
Calcium: 9.5 mg/dL (ref 8.4–10.5)
Chloride: 100 mEq/L (ref 96–112)
Creatinine, Ser: 0.95 mg/dL (ref 0.40–1.20)
Glucose, Bld: 93 mg/dL (ref 70–99)
Potassium: 3.6 mEq/L (ref 3.5–5.3)
Sodium: 140 mEq/L (ref 135–145)
Total Bilirubin: 0.4 mg/dL (ref 0.3–1.2)
Total Protein: 6.5 g/dL (ref 6.0–8.3)

## 2009-07-06 ENCOUNTER — Ambulatory Visit: Payer: Self-pay | Admitting: Oncology

## 2009-07-10 LAB — CBC WITH DIFFERENTIAL/PLATELET
BASO%: 0.7 % (ref 0.0–2.0)
Basophils Absolute: 0 10*3/uL (ref 0.0–0.1)
EOS%: 2.2 % (ref 0.0–7.0)
Eosinophils Absolute: 0.1 10*3/uL (ref 0.0–0.5)
HCT: 35.2 % (ref 34.8–46.6)
HGB: 11 g/dL — ABNORMAL LOW (ref 11.6–15.9)
LYMPH%: 17.5 % (ref 14.0–49.7)
MCH: 24.4 pg — ABNORMAL LOW (ref 25.1–34.0)
MCHC: 31.3 g/dL — ABNORMAL LOW (ref 31.5–36.0)
MCV: 78.2 fL — ABNORMAL LOW (ref 79.5–101.0)
MONO#: 0.4 10*3/uL (ref 0.1–0.9)
MONO%: 5.9 % (ref 0.0–14.0)
NEUT#: 4.4 10*3/uL (ref 1.5–6.5)
NEUT%: 73.7 % (ref 38.4–76.8)
Platelets: 322 10*3/uL (ref 145–400)
RBC: 4.5 10*6/uL (ref 3.70–5.45)
RDW: 14.7 % — ABNORMAL HIGH (ref 11.2–14.5)
WBC: 6 10*3/uL (ref 3.9–10.3)
lymph#: 1 10*3/uL (ref 0.9–3.3)
nRBC: 0 % (ref 0–0)

## 2009-07-10 LAB — MORPHOLOGY: PLT EST: ADEQUATE

## 2009-07-10 LAB — PROTIME-INR
INR: 2 (ref 2.00–3.50)
Protime: 24 Seconds — ABNORMAL HIGH (ref 10.6–13.4)

## 2009-08-06 ENCOUNTER — Ambulatory Visit: Payer: Self-pay | Admitting: Oncology

## 2009-08-07 LAB — LACTATE DEHYDROGENASE: LDH: 165 U/L (ref 94–250)

## 2009-08-07 LAB — CBC WITH DIFFERENTIAL/PLATELET
BASO%: 0.8 % (ref 0.0–2.0)
Basophils Absolute: 0 10*3/uL (ref 0.0–0.1)
EOS%: 3 % (ref 0.0–7.0)
Eosinophils Absolute: 0.2 10*3/uL (ref 0.0–0.5)
HCT: 34.4 % — ABNORMAL LOW (ref 34.8–46.6)
HGB: 10.7 g/dL — ABNORMAL LOW (ref 11.6–15.9)
LYMPH%: 20.9 % (ref 14.0–49.7)
MCH: 23.4 pg — ABNORMAL LOW (ref 25.1–34.0)
MCHC: 31.1 g/dL — ABNORMAL LOW (ref 31.5–36.0)
MCV: 75.3 fL — ABNORMAL LOW (ref 79.5–101.0)
MONO#: 0.3 10*3/uL (ref 0.1–0.9)
MONO%: 5.7 % (ref 0.0–14.0)
NEUT#: 3.4 10*3/uL (ref 1.5–6.5)
NEUT%: 69.6 % (ref 38.4–76.8)
Platelets: 323 10*3/uL (ref 145–400)
RBC: 4.57 10*6/uL (ref 3.70–5.45)
RDW: 16.1 % — ABNORMAL HIGH (ref 11.2–14.5)
WBC: 4.9 10*3/uL (ref 3.9–10.3)
lymph#: 1 10*3/uL (ref 0.9–3.3)
nRBC: 0 % (ref 0–0)

## 2009-08-07 LAB — COMPREHENSIVE METABOLIC PANEL
ALT: 16 U/L (ref 0–35)
AST: 17 U/L (ref 0–37)
Albumin: 3.9 g/dL (ref 3.5–5.2)
Alkaline Phosphatase: 42 U/L (ref 39–117)
BUN: 16 mg/dL (ref 6–23)
CO2: 28 mEq/L (ref 19–32)
Calcium: 8.9 mg/dL (ref 8.4–10.5)
Chloride: 105 mEq/L (ref 96–112)
Creatinine, Ser: 0.93 mg/dL (ref 0.40–1.20)
Glucose, Bld: 143 mg/dL — ABNORMAL HIGH (ref 70–99)
Potassium: 3.6 mEq/L (ref 3.5–5.3)
Sodium: 138 mEq/L (ref 135–145)
Total Bilirubin: 0.7 mg/dL (ref 0.3–1.2)
Total Protein: 6.2 g/dL (ref 6.0–8.3)

## 2009-08-07 LAB — PROTIME-INR
INR: 2.7 (ref 2.00–3.50)
Protime: 32.4 Seconds — ABNORMAL HIGH (ref 10.6–13.4)

## 2009-08-07 LAB — VITAMIN D 25 HYDROXY (VIT D DEFICIENCY, FRACTURES): Vit D, 25-Hydroxy: 61 ng/mL (ref 30–89)

## 2009-08-07 LAB — CANCER ANTIGEN 27.29: CA 27.29: 41 U/mL — ABNORMAL HIGH (ref 0–39)

## 2009-08-17 LAB — PROTIME-INR
INR: 2.7 (ref 2.00–3.50)
Protime: 32.4 Seconds — ABNORMAL HIGH (ref 10.6–13.4)

## 2009-09-10 ENCOUNTER — Ambulatory Visit: Payer: Self-pay | Admitting: Oncology

## 2009-09-12 LAB — CBC WITH DIFFERENTIAL/PLATELET
BASO%: 1.7 % (ref 0.0–2.0)
Basophils Absolute: 0.1 10*3/uL (ref 0.0–0.1)
EOS%: 3.5 % (ref 0.0–7.0)
Eosinophils Absolute: 0.2 10*3/uL (ref 0.0–0.5)
HCT: 38 % (ref 34.8–46.6)
HGB: 11.6 g/dL (ref 11.6–15.9)
LYMPH%: 22.9 % (ref 14.0–49.7)
MCH: 23 pg — ABNORMAL LOW (ref 25.1–34.0)
MCHC: 30.5 g/dL — ABNORMAL LOW (ref 31.5–36.0)
MCV: 75.2 fL — ABNORMAL LOW (ref 79.5–101.0)
MONO#: 0.3 10*3/uL (ref 0.1–0.9)
MONO%: 5 % (ref 0.0–14.0)
NEUT#: 3.6 10*3/uL (ref 1.5–6.5)
NEUT%: 66.9 % (ref 38.4–76.8)
Platelets: 289 10*3/uL (ref 145–400)
RBC: 5.05 10*6/uL (ref 3.70–5.45)
RDW: 18.4 % — ABNORMAL HIGH (ref 11.2–14.5)
WBC: 5.4 10*3/uL (ref 3.9–10.3)
lymph#: 1.2 10*3/uL (ref 0.9–3.3)
nRBC: 0 % (ref 0–0)

## 2009-09-12 LAB — PROTIME-INR
INR: 1.2 — ABNORMAL LOW (ref 2.00–3.50)
Protime: 14.4 Seconds — ABNORMAL HIGH (ref 10.6–13.4)

## 2009-09-13 ENCOUNTER — Inpatient Hospital Stay (HOSPITAL_COMMUNITY): Admission: RE | Admit: 2009-09-13 | Discharge: 2009-09-18 | Payer: Self-pay | Admitting: General Surgery

## 2009-09-16 ENCOUNTER — Ambulatory Visit: Payer: Self-pay | Admitting: Oncology

## 2009-09-21 LAB — CBC WITH DIFFERENTIAL/PLATELET
BASO%: 0.9 % (ref 0.0–2.0)
Basophils Absolute: 0.1 10*3/uL (ref 0.0–0.1)
EOS%: 3.8 % (ref 0.0–7.0)
Eosinophils Absolute: 0.3 10*3/uL (ref 0.0–0.5)
HCT: 36.4 % (ref 34.8–46.6)
HGB: 11.5 g/dL — ABNORMAL LOW (ref 11.6–15.9)
LYMPH%: 15.9 % (ref 14.0–49.7)
MCH: 23.2 pg — ABNORMAL LOW (ref 25.1–34.0)
MCHC: 31.6 g/dL (ref 31.5–36.0)
MCV: 73.4 fL — ABNORMAL LOW (ref 79.5–101.0)
MONO#: 0.5 10*3/uL (ref 0.1–0.9)
MONO%: 5.9 % (ref 0.0–14.0)
NEUT#: 6.2 10*3/uL (ref 1.5–6.5)
NEUT%: 73.5 % (ref 38.4–76.8)
Platelets: 376 10*3/uL (ref 145–400)
RBC: 4.96 10*6/uL (ref 3.70–5.45)
RDW: 19 % — ABNORMAL HIGH (ref 11.2–14.5)
WBC: 8.4 10*3/uL (ref 3.9–10.3)
lymph#: 1.3 10*3/uL (ref 0.9–3.3)
nRBC: 0 % (ref 0–0)

## 2009-09-21 LAB — PROTIME-INR
INR: 2.5 (ref 2.00–3.50)
Protime: 30 Seconds — ABNORMAL HIGH (ref 10.6–13.4)

## 2009-09-28 LAB — CBC WITH DIFFERENTIAL/PLATELET
BASO%: 1.1 % (ref 0.0–2.0)
Basophils Absolute: 0.1 10*3/uL (ref 0.0–0.1)
EOS%: 5.9 % (ref 0.0–7.0)
Eosinophils Absolute: 0.5 10*3/uL (ref 0.0–0.5)
HCT: 36.8 % (ref 34.8–46.6)
HGB: 11.5 g/dL — ABNORMAL LOW (ref 11.6–15.9)
LYMPH%: 16.5 % (ref 14.0–49.7)
MCH: 23.4 pg — ABNORMAL LOW (ref 25.1–34.0)
MCHC: 31.3 g/dL — ABNORMAL LOW (ref 31.5–36.0)
MCV: 74.9 fL — ABNORMAL LOW (ref 79.5–101.0)
MONO#: 0.3 10*3/uL (ref 0.1–0.9)
MONO%: 4.2 % (ref 0.0–14.0)
NEUT#: 5.9 10*3/uL (ref 1.5–6.5)
NEUT%: 72.3 % (ref 38.4–76.8)
Platelets: 512 10*3/uL — ABNORMAL HIGH (ref 145–400)
RBC: 4.91 10*6/uL (ref 3.70–5.45)
RDW: 20 % — ABNORMAL HIGH (ref 11.2–14.5)
WBC: 8.2 10*3/uL (ref 3.9–10.3)
lymph#: 1.4 10*3/uL (ref 0.9–3.3)
nRBC: 0 % (ref 0–0)

## 2009-09-28 LAB — PROTIME-INR
INR: 2.4 (ref 2.00–3.50)
Protime: 28.8 Seconds — ABNORMAL HIGH (ref 10.6–13.4)

## 2009-10-11 ENCOUNTER — Ambulatory Visit: Payer: Self-pay | Admitting: Oncology

## 2009-10-11 LAB — PROTIME-INR
INR: 2.6 (ref 2.00–3.50)
Protime: 31.2 Seconds — ABNORMAL HIGH (ref 10.6–13.4)

## 2009-10-11 LAB — CBC WITH DIFFERENTIAL/PLATELET
BASO%: 1.3 % (ref 0.0–2.0)
Basophils Absolute: 0.1 10*3/uL (ref 0.0–0.1)
EOS%: 9.2 % — ABNORMAL HIGH (ref 0.0–7.0)
Eosinophils Absolute: 0.7 10*3/uL — ABNORMAL HIGH (ref 0.0–0.5)
HCT: 37.1 % (ref 34.8–46.6)
HGB: 11.7 g/dL (ref 11.6–15.9)
LYMPH%: 14.8 % (ref 14.0–49.7)
MCH: 24.2 pg — ABNORMAL LOW (ref 25.1–34.0)
MCHC: 31.5 g/dL (ref 31.5–36.0)
MCV: 76.7 fL — ABNORMAL LOW (ref 79.5–101.0)
MONO#: 0.5 10*3/uL (ref 0.1–0.9)
MONO%: 5.9 % (ref 0.0–14.0)
NEUT#: 5.2 10*3/uL (ref 1.5–6.5)
NEUT%: 68.8 % (ref 38.4–76.8)
Platelets: 302 10*3/uL (ref 145–400)
RBC: 4.84 10*6/uL (ref 3.70–5.45)
RDW: 20.6 % — ABNORMAL HIGH (ref 11.2–14.5)
WBC: 7.6 10*3/uL (ref 3.9–10.3)
lymph#: 1.1 10*3/uL (ref 0.9–3.3)

## 2009-11-09 LAB — PROTIME-INR
INR: 2.4 (ref 2.00–3.50)
Protime: 28.8 Seconds — ABNORMAL HIGH (ref 10.6–13.4)

## 2010-01-04 ENCOUNTER — Ambulatory Visit: Payer: Self-pay | Admitting: Oncology

## 2010-01-08 LAB — CBC WITH DIFFERENTIAL/PLATELET
BASO%: 0.6 % (ref 0.0–2.0)
Basophils Absolute: 0 10*3/uL (ref 0.0–0.1)
EOS%: 1.9 % (ref 0.0–7.0)
Eosinophils Absolute: 0.1 10*3/uL (ref 0.0–0.5)
HCT: 35.8 % (ref 34.8–46.6)
HGB: 11.7 g/dL (ref 11.6–15.9)
LYMPH%: 15.2 % (ref 14.0–49.7)
MCH: 24.8 pg — ABNORMAL LOW (ref 25.1–34.0)
MCHC: 32.5 g/dL (ref 31.5–36.0)
MCV: 76.2 fL — ABNORMAL LOW (ref 79.5–101.0)
MONO#: 0.3 10*3/uL (ref 0.1–0.9)
MONO%: 4.5 % (ref 0.0–14.0)
NEUT#: 5.6 10*3/uL (ref 1.5–6.5)
NEUT%: 77.8 % — ABNORMAL HIGH (ref 38.4–76.8)
Platelets: 386 10*3/uL (ref 145–400)
RBC: 4.71 10*6/uL (ref 3.70–5.45)
RDW: 16.6 % — ABNORMAL HIGH (ref 11.2–14.5)
WBC: 7.3 10*3/uL (ref 3.9–10.3)
lymph#: 1.1 10*3/uL (ref 0.9–3.3)

## 2010-01-08 LAB — PROTIME-INR
INR: 1.5 — ABNORMAL LOW (ref 2.00–3.50)
Protime: 18 Seconds — ABNORMAL HIGH (ref 10.6–13.4)

## 2010-01-08 LAB — COMPREHENSIVE METABOLIC PANEL
ALT: 17 U/L (ref 0–35)
AST: 16 U/L (ref 0–37)
Albumin: 4.2 g/dL (ref 3.5–5.2)
Alkaline Phosphatase: 49 U/L (ref 39–117)
BUN: 17 mg/dL (ref 6–23)
CO2: 33 mEq/L — ABNORMAL HIGH (ref 19–32)
Calcium: 9.8 mg/dL (ref 8.4–10.5)
Chloride: 104 mEq/L (ref 96–112)
Creatinine, Ser: 0.92 mg/dL (ref 0.40–1.20)
Glucose, Bld: 84 mg/dL (ref 70–99)
Potassium: 3.8 mEq/L (ref 3.5–5.3)
Sodium: 140 mEq/L (ref 135–145)
Total Bilirubin: 0.9 mg/dL (ref 0.3–1.2)
Total Protein: 6.7 g/dL (ref 6.0–8.3)

## 2010-01-08 LAB — MORPHOLOGY
PLT EST: ADEQUATE
RBC Comments: NORMAL

## 2010-01-08 LAB — LACTATE DEHYDROGENASE: LDH: 189 U/L (ref 94–250)

## 2010-01-16 LAB — PROTIME-INR
INR: 2.2 (ref 2.00–3.50)
Protime: 26.4 Seconds — ABNORMAL HIGH (ref 10.6–13.4)

## 2010-02-05 ENCOUNTER — Ambulatory Visit: Payer: Self-pay | Admitting: Oncology

## 2010-02-05 IMAGING — CT CT ANGIO CHEST
2 of 6 series · 19 of 46 positions shown · IV contrast (APPLIED)
Comparison: None

CLINICAL DATA: rule out pulmonary embolus

CT ANGIOGRAPHY CHEST WITH CONTRAST
TECHNIQUE: Multidetector CT imaging of the chest was performed
using the standard protocol during bolus administration of
intravenous contrast. Multiplanar CT image reconstructions
including MIPs were obtained to evaluate the vascular anatomy.
Contrast: 80 ml of Qmnipaque-9LL

[Series 5: pe thins @ 1mm · axial · 0.61mm/px · z∈[-172,+7]mm · 16 of 197 slices shown]
[im 9/197  lung]
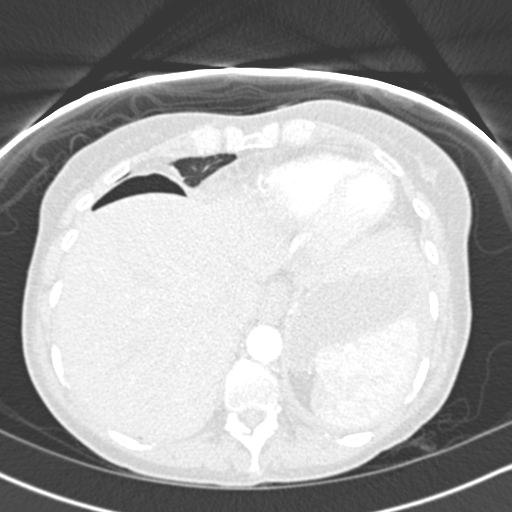
[im 26/197  soft-tissue]
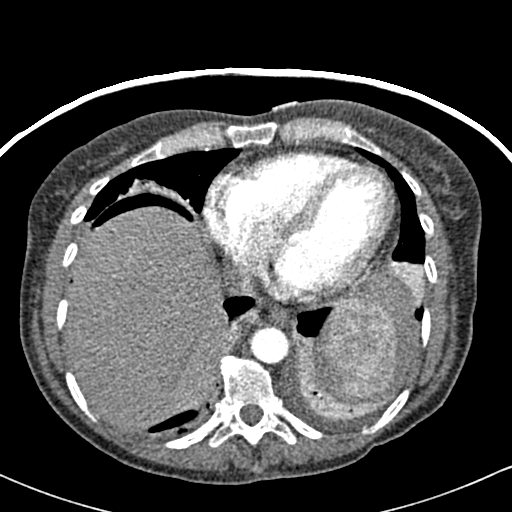
[im 35/197  lung]
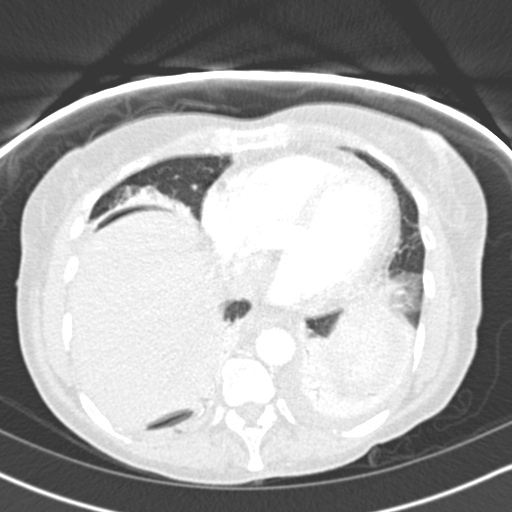
[im 43/197  soft-tissue]
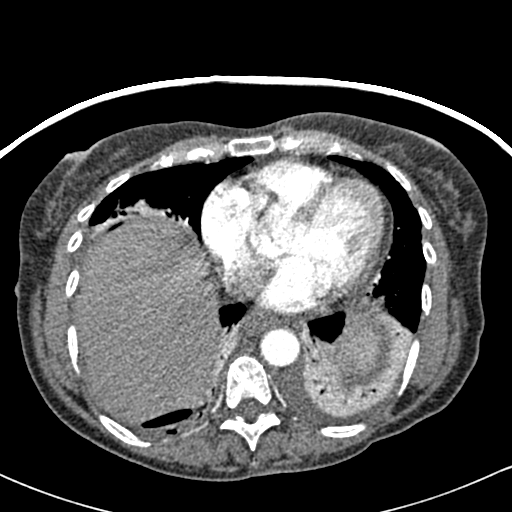
[im 60/197  lung]
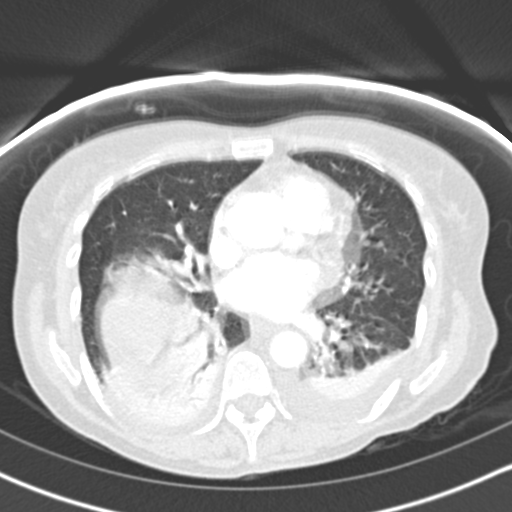
[im 69/197  soft-tissue]
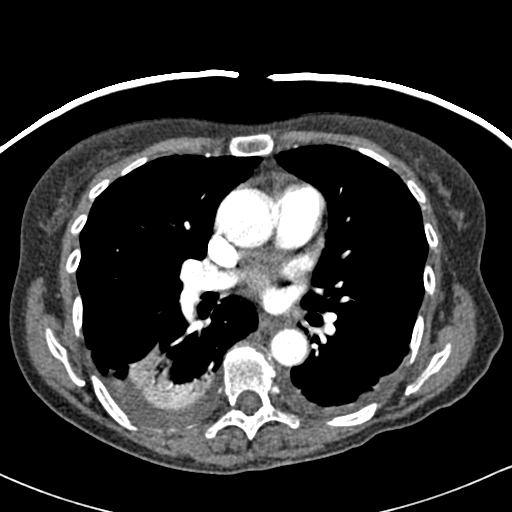
[im 77/197  lung]
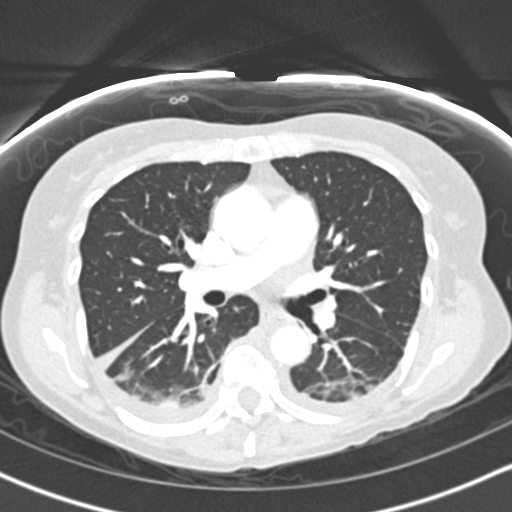
[im 94/197  soft-tissue]
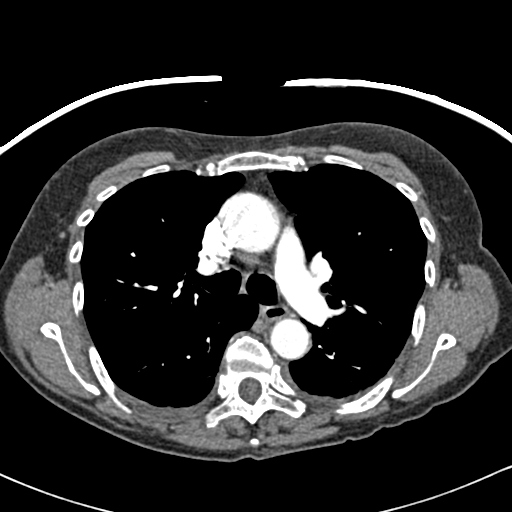
[im 103/197  lung]
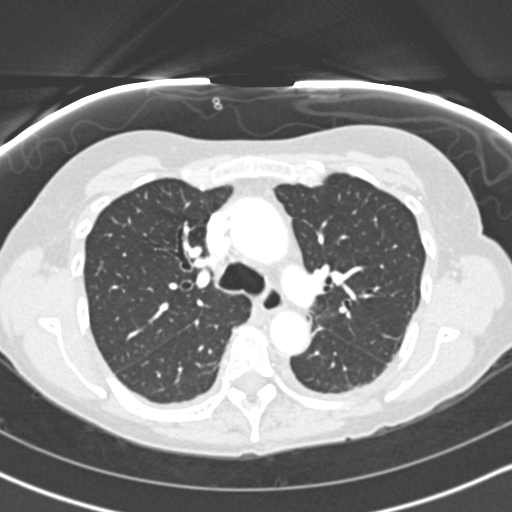
[im 120/197  soft-tissue]
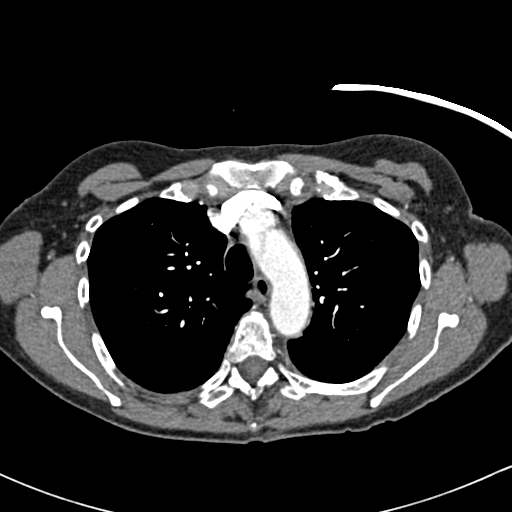
[im 128/197  lung]
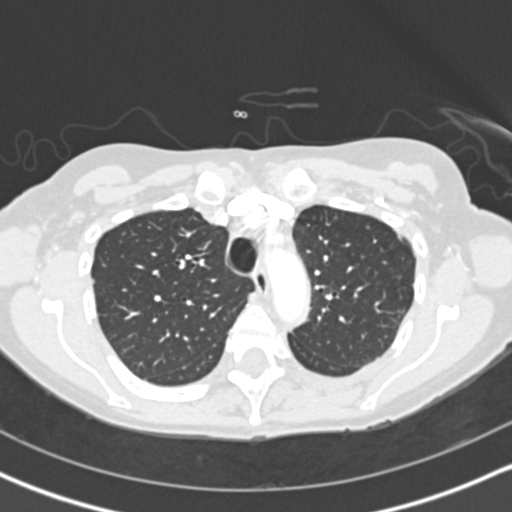
[im 137/197  soft-tissue]
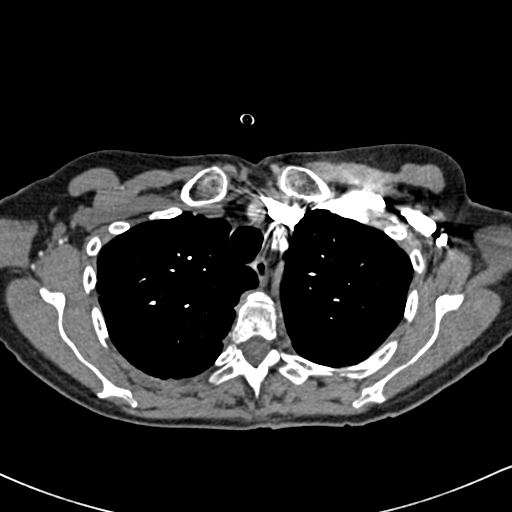
[im 154/197  lung]
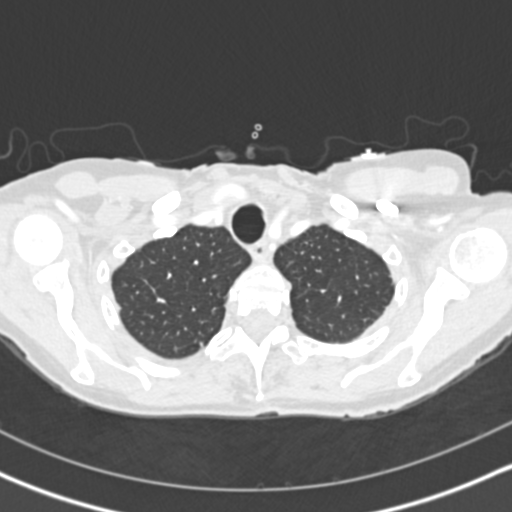
[im 162/197  soft-tissue]
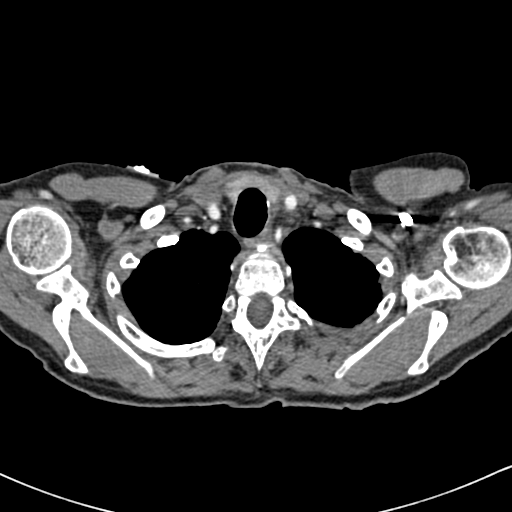
[im 171/197  lung]
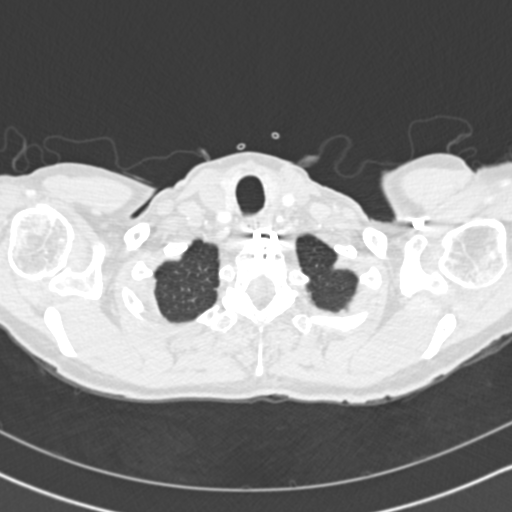
[im 188/197  soft-tissue]
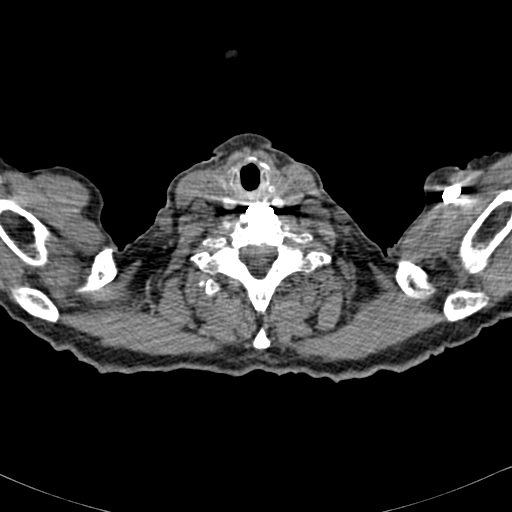

[Series 602: coronal mpr · coronal · 0.61mm/px · 3 of 103 slices shown]
[im 26/103  soft-tissue]
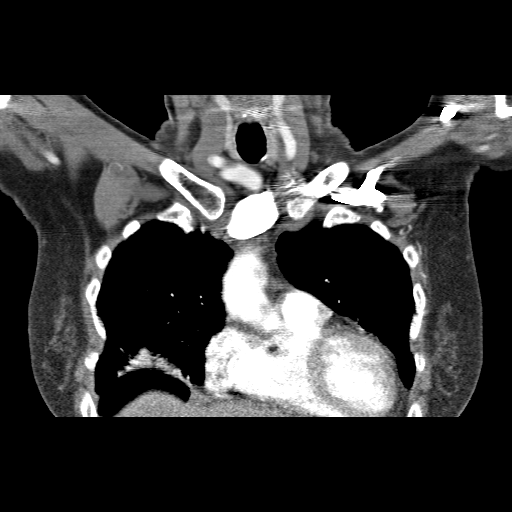
[im 52/103  soft-tissue]
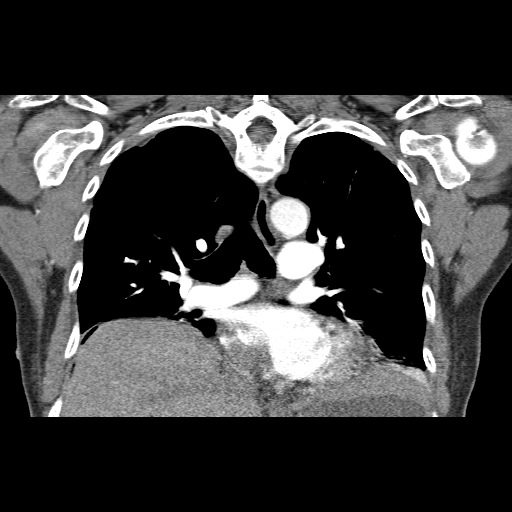
[im 77/103  soft-tissue]
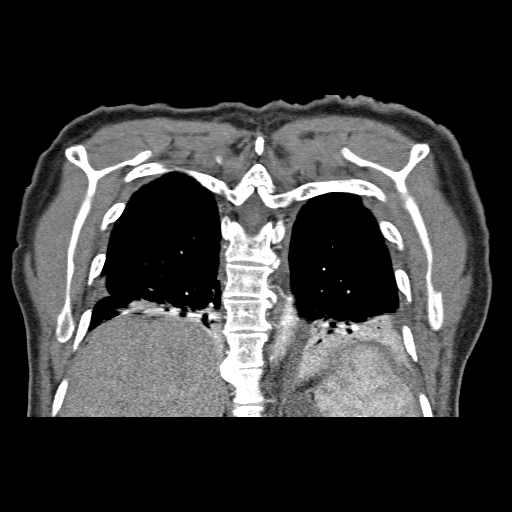

[19 of 46 positions shown; findings below may reference images not displayed]

FINDINGS: Postsurgical changes noted lower cervical spine.

Images of the thoracic inlet are unremarkable.

Small amount of free abdominal air noted anterior to the liver
probable postsurgical in nature.

Heart size is within normal limits.  No pericardial effusion.

The central airways are patent.  No adenopathy noted.  Ascending
aorta measures 3.3 x 3.4 cm in diameter.  Descending aorta measures
2.2 x 2.3 cm in diameter.  No aortic dissection.

No pulmonary embolus is noted.

Bilateral small pleural effusion noted.  Bilateral basilar
posterior consolidation with air bronchogram is noted.  This may be
due to infiltrate or atelectasis.

No pulmonary edema.  Computer reconstructed images shows no
evidence of pulmonary embolus.  Mild degenerative changes thoracic
spine are noted.  No destructive bony lesions are noted.

Review of the MIP images confirms the above findings.
IMPRESSION: 1.  No pulmonary embolus.
2.  Aneurysmal dilatation of the ascending aorta.
3.  Small bilateral pleural effusion.  Bilateral lower lobe
posterior consolidation with air bronchogram probable infiltrate or
atelectasis.
4.  Free abdominal air probable postsurgical in nature.

## 2010-02-07 LAB — CBC WITH DIFFERENTIAL/PLATELET
BASO%: 0.5 % (ref 0.0–2.0)
Basophils Absolute: 0 10*3/uL (ref 0.0–0.1)
EOS%: 2.9 % (ref 0.0–7.0)
Eosinophils Absolute: 0.2 10*3/uL (ref 0.0–0.5)
HCT: 36.9 % (ref 34.8–46.6)
HGB: 11.7 g/dL (ref 11.6–15.9)
LYMPH%: 17.8 % (ref 14.0–49.7)
MCH: 24.1 pg — ABNORMAL LOW (ref 25.1–34.0)
MCHC: 31.8 g/dL (ref 31.5–36.0)
MCV: 75.8 fL — ABNORMAL LOW (ref 79.5–101.0)
MONO#: 0.3 10*3/uL (ref 0.1–0.9)
MONO%: 5.2 % (ref 0.0–14.0)
NEUT#: 4.6 10*3/uL (ref 1.5–6.5)
NEUT%: 73.6 % (ref 38.4–76.8)
Platelets: 388 10*3/uL (ref 145–400)
RBC: 4.87 10*6/uL (ref 3.70–5.45)
RDW: 17.3 % — ABNORMAL HIGH (ref 11.2–14.5)
WBC: 6.3 10*3/uL (ref 3.9–10.3)
lymph#: 1.1 10*3/uL (ref 0.9–3.3)

## 2010-02-07 LAB — COMPREHENSIVE METABOLIC PANEL
ALT: 15 U/L (ref 0–35)
AST: 15 U/L (ref 0–37)
Albumin: 4 g/dL (ref 3.5–5.2)
Alkaline Phosphatase: 51 U/L (ref 39–117)
BUN: 21 mg/dL (ref 6–23)
CO2: 27 mEq/L (ref 19–32)
Calcium: 9.4 mg/dL (ref 8.4–10.5)
Chloride: 103 mEq/L (ref 96–112)
Creatinine, Ser: 0.91 mg/dL (ref 0.40–1.20)
Glucose, Bld: 88 mg/dL (ref 70–99)
Potassium: 4.1 mEq/L (ref 3.5–5.3)
Sodium: 140 mEq/L (ref 135–145)
Total Bilirubin: 0.6 mg/dL (ref 0.3–1.2)
Total Protein: 6.7 g/dL (ref 6.0–8.3)

## 2010-02-07 LAB — LACTATE DEHYDROGENASE: LDH: 194 U/L (ref 94–250)

## 2010-02-07 LAB — PROTIME-INR
INR: 2.3 (ref 2.00–3.50)
Protime: 27.6 Seconds — ABNORMAL HIGH (ref 10.6–13.4)

## 2010-02-08 LAB — CANCER ANTIGEN 27.29: CA 27.29: 37 U/mL (ref 0–39)

## 2010-02-08 LAB — VITAMIN D 25 HYDROXY (VIT D DEFICIENCY, FRACTURES): Vit D, 25-Hydroxy: 62 ng/mL (ref 30–89)

## 2010-03-07 ENCOUNTER — Ambulatory Visit: Payer: Self-pay | Admitting: Oncology

## 2010-03-07 LAB — PROTIME-INR
INR: 2.4 (ref 2.00–3.50)
Protime: 28.8 Seconds — ABNORMAL HIGH (ref 10.6–13.4)

## 2010-04-04 ENCOUNTER — Ambulatory Visit: Payer: Self-pay | Admitting: Oncology

## 2010-04-04 LAB — PROTIME-INR
INR: 2.8 (ref 2.00–3.50)
Protime: 33.6 Seconds — ABNORMAL HIGH (ref 10.6–13.4)

## 2010-04-21 ENCOUNTER — Encounter: Payer: Self-pay | Admitting: General Surgery

## 2010-04-21 ENCOUNTER — Encounter: Payer: Self-pay | Admitting: Family Medicine

## 2010-05-23 ENCOUNTER — Other Ambulatory Visit: Payer: Self-pay | Admitting: Oncology

## 2010-05-23 ENCOUNTER — Encounter (HOSPITAL_BASED_OUTPATIENT_CLINIC_OR_DEPARTMENT_OTHER): Payer: Managed Care, Other (non HMO) | Admitting: Oncology

## 2010-05-23 DIAGNOSIS — C50419 Malignant neoplasm of upper-outer quadrant of unspecified female breast: Secondary | ICD-10-CM

## 2010-05-23 DIAGNOSIS — D47Z9 Other specified neoplasms of uncertain behavior of lymphoid, hematopoietic and related tissue: Secondary | ICD-10-CM

## 2010-05-23 DIAGNOSIS — Z7901 Long term (current) use of anticoagulants: Secondary | ICD-10-CM

## 2010-05-23 DIAGNOSIS — D6859 Other primary thrombophilia: Secondary | ICD-10-CM

## 2010-05-23 LAB — PROTIME-INR
INR: 1.7 — ABNORMAL LOW (ref 2.00–3.50)
Protime: 20.4 Seconds — ABNORMAL HIGH (ref 10.6–13.4)

## 2010-05-29 ENCOUNTER — Encounter (HOSPITAL_BASED_OUTPATIENT_CLINIC_OR_DEPARTMENT_OTHER): Payer: Medicare Other | Admitting: Oncology

## 2010-05-29 ENCOUNTER — Other Ambulatory Visit: Payer: Self-pay | Admitting: Oncology

## 2010-05-29 DIAGNOSIS — D6859 Other primary thrombophilia: Secondary | ICD-10-CM

## 2010-05-29 DIAGNOSIS — Z7901 Long term (current) use of anticoagulants: Secondary | ICD-10-CM

## 2010-05-29 DIAGNOSIS — Z5181 Encounter for therapeutic drug level monitoring: Secondary | ICD-10-CM

## 2010-05-29 DIAGNOSIS — D47Z9 Other specified neoplasms of uncertain behavior of lymphoid, hematopoietic and related tissue: Secondary | ICD-10-CM

## 2010-05-29 DIAGNOSIS — Z86718 Personal history of other venous thrombosis and embolism: Secondary | ICD-10-CM

## 2010-05-29 LAB — PROTIME-INR
INR: 1.7 — ABNORMAL LOW (ref 2.00–3.50)
Protime: 20.4 Seconds — ABNORMAL HIGH (ref 10.6–13.4)

## 2010-06-11 ENCOUNTER — Encounter (HOSPITAL_BASED_OUTPATIENT_CLINIC_OR_DEPARTMENT_OTHER): Payer: Medicare Other | Admitting: Oncology

## 2010-06-11 ENCOUNTER — Other Ambulatory Visit: Payer: Self-pay | Admitting: Oncology

## 2010-06-11 DIAGNOSIS — Z7901 Long term (current) use of anticoagulants: Secondary | ICD-10-CM

## 2010-06-11 DIAGNOSIS — C50419 Malignant neoplasm of upper-outer quadrant of unspecified female breast: Secondary | ICD-10-CM

## 2010-06-11 DIAGNOSIS — D6859 Other primary thrombophilia: Secondary | ICD-10-CM

## 2010-06-11 DIAGNOSIS — D47Z9 Other specified neoplasms of uncertain behavior of lymphoid, hematopoietic and related tissue: Secondary | ICD-10-CM

## 2010-06-11 LAB — CBC WITH DIFFERENTIAL/PLATELET
BASO%: 1.3 % (ref 0.0–2.0)
Basophils Absolute: 0.1 10*3/uL (ref 0.0–0.1)
EOS%: 2 % (ref 0.0–7.0)
Eosinophils Absolute: 0.1 10*3/uL (ref 0.0–0.5)
HCT: 33.2 % — ABNORMAL LOW (ref 34.8–46.6)
HGB: 11 g/dL — ABNORMAL LOW (ref 11.6–15.9)
LYMPH%: 18.1 % (ref 14.0–49.7)
MCH: 23.9 pg — ABNORMAL LOW (ref 25.1–34.0)
MCHC: 33.2 g/dL (ref 31.5–36.0)
MCV: 72 fL — ABNORMAL LOW (ref 79.5–101.0)
MONO#: 0.2 10*3/uL (ref 0.1–0.9)
MONO%: 2.4 % (ref 0.0–14.0)
NEUT#: 5.6 10*3/uL (ref 1.5–6.5)
NEUT%: 76.2 % (ref 38.4–76.8)
Platelets: 409 10*3/uL — ABNORMAL HIGH (ref 145–400)
RBC: 4.6 10*6/uL (ref 3.70–5.45)
RDW: 17.1 % — ABNORMAL HIGH (ref 11.2–14.5)
WBC: 7.4 10*3/uL (ref 3.9–10.3)
lymph#: 1.3 10*3/uL (ref 0.9–3.3)

## 2010-06-11 LAB — PROTIME-INR
INR: 2.1 (ref 2.00–3.50)
Protime: 25.2 Seconds — ABNORMAL HIGH (ref 10.6–13.4)

## 2010-06-16 LAB — PROTIME-INR
INR: 1.08 (ref 0.00–1.49)
INR: 1.3 (ref 0.00–1.49)
INR: 1.49 (ref 0.00–1.49)
INR: 1.56 — ABNORMAL HIGH (ref 0.00–1.49)
INR: 1.76 — ABNORMAL HIGH (ref 0.00–1.49)
Prothrombin Time: 13.9 seconds (ref 11.6–15.2)
Prothrombin Time: 16.1 seconds — ABNORMAL HIGH (ref 11.6–15.2)
Prothrombin Time: 17.9 seconds — ABNORMAL HIGH (ref 11.6–15.2)
Prothrombin Time: 18.5 seconds — ABNORMAL HIGH (ref 11.6–15.2)
Prothrombin Time: 20.4 seconds — ABNORMAL HIGH (ref 11.6–15.2)

## 2010-06-16 LAB — CBC
HCT: 28.4 % — ABNORMAL LOW (ref 36.0–46.0)
HCT: 29.2 % — ABNORMAL LOW (ref 36.0–46.0)
Hemoglobin: 9 g/dL — ABNORMAL LOW (ref 12.0–15.0)
Hemoglobin: 9.1 g/dL — ABNORMAL LOW (ref 12.0–15.0)
MCHC: 31 g/dL (ref 30.0–36.0)
MCHC: 31.8 g/dL (ref 30.0–36.0)
MCV: 74.1 fL — ABNORMAL LOW (ref 78.0–100.0)
MCV: 74.7 fL — ABNORMAL LOW (ref 78.0–100.0)
Platelets: 214 10*3/uL (ref 150–400)
Platelets: 254 10*3/uL (ref 150–400)
RBC: 3.83 MIL/uL — ABNORMAL LOW (ref 3.87–5.11)
RBC: 3.92 MIL/uL (ref 3.87–5.11)
RDW: 19.9 % — ABNORMAL HIGH (ref 11.5–15.5)
RDW: 20.2 % — ABNORMAL HIGH (ref 11.5–15.5)
WBC: 3.7 10*3/uL — ABNORMAL LOW (ref 4.0–10.5)
WBC: 5.7 10*3/uL (ref 4.0–10.5)

## 2010-06-16 LAB — BASIC METABOLIC PANEL
BUN: 7 mg/dL (ref 6–23)
CO2: 29 mEq/L (ref 19–32)
Calcium: 8.4 mg/dL (ref 8.4–10.5)
Chloride: 106 mEq/L (ref 96–112)
Creatinine, Ser: 0.85 mg/dL (ref 0.4–1.2)
GFR calc Af Amer: >60 mL/min (ref >60)
GFR calc non Af Amer: >60 mL/min (ref >60)
Glucose, Bld: 83 mg/dL (ref 70–99)
Potassium: 3.4 mEq/L — ABNORMAL LOW (ref 3.5–5.1)
Sodium: 139 mEq/L (ref 135–145)

## 2010-06-16 LAB — POTASSIUM: Potassium: 3.7 mEq/L (ref 3.5–5.1)

## 2010-06-16 LAB — APTT: aPTT: 30 seconds (ref 24–37)

## 2010-06-17 LAB — BASIC METABOLIC PANEL
BUN: 15 mg/dL (ref 6–23)
CO2: 29 mEq/L (ref 19–32)
Calcium: 9.3 mg/dL (ref 8.4–10.5)
Chloride: 104 mEq/L (ref 96–112)
Creatinine, Ser: 0.99 mg/dL (ref 0.4–1.2)
GFR calc Af Amer: >60 mL/min (ref >60)
GFR calc non Af Amer: 56 mL/min — ABNORMAL LOW (ref >60)
Glucose, Bld: 72 mg/dL (ref 70–99)
Potassium: 3.9 mEq/L (ref 3.5–5.1)
Sodium: 139 mEq/L (ref 135–145)

## 2010-06-17 LAB — DIFFERENTIAL
Basophils Absolute: 0 10*3/uL (ref 0.0–0.1)
Basophils Relative: 1 % (ref 0–1)
Eosinophils Absolute: 0.1 10*3/uL (ref 0.0–0.7)
Eosinophils Relative: 2 % (ref 0–5)
Lymphocytes Relative: 16 % (ref 12–46)
Lymphs Abs: 1 10*3/uL (ref 0.7–4.0)
Monocytes Absolute: 0.3 10*3/uL (ref 0.1–1.0)
Monocytes Relative: 5 % (ref 3–12)
Neutro Abs: 4.7 10*3/uL (ref 1.7–7.7)
Neutrophils Relative %: 76 % (ref 43–77)

## 2010-06-17 LAB — CBC
HCT: 36.7 % (ref 36.0–46.0)
Hemoglobin: 11.6 g/dL — ABNORMAL LOW (ref 12.0–15.0)
MCHC: 31.6 g/dL (ref 30.0–36.0)
MCV: 74.5 fL — ABNORMAL LOW (ref 78.0–100.0)
Platelets: 337 10*3/uL (ref 150–400)
RBC: 4.93 MIL/uL (ref 3.87–5.11)
RDW: 19.4 % — ABNORMAL HIGH (ref 11.5–15.5)
WBC: 6.2 10*3/uL (ref 4.0–10.5)

## 2010-06-17 LAB — SURGICAL PCR SCREEN
MRSA, PCR: NEGATIVE
Staphylococcus aureus: NEGATIVE

## 2010-06-27 ENCOUNTER — Encounter (HOSPITAL_BASED_OUTPATIENT_CLINIC_OR_DEPARTMENT_OTHER): Payer: Medicare Other | Admitting: Oncology

## 2010-06-27 ENCOUNTER — Other Ambulatory Visit: Payer: Self-pay | Admitting: Oncology

## 2010-06-27 DIAGNOSIS — Z7901 Long term (current) use of anticoagulants: Secondary | ICD-10-CM

## 2010-06-27 DIAGNOSIS — D47Z9 Other specified neoplasms of uncertain behavior of lymphoid, hematopoietic and related tissue: Secondary | ICD-10-CM

## 2010-06-27 DIAGNOSIS — C50419 Malignant neoplasm of upper-outer quadrant of unspecified female breast: Secondary | ICD-10-CM

## 2010-06-27 DIAGNOSIS — D6859 Other primary thrombophilia: Secondary | ICD-10-CM

## 2010-06-27 LAB — PROTIME-INR
INR: 1.9 — ABNORMAL LOW (ref 2.00–3.50)
Protime: 22.8 Seconds — ABNORMAL HIGH (ref 10.6–13.4)

## 2010-07-05 LAB — DIFFERENTIAL
Basophils Absolute: 0.1 10*3/uL (ref 0.0–0.1)
Basophils Relative: 1 % (ref 0–1)
Eosinophils Absolute: 0.2 10*3/uL (ref 0.0–0.7)
Eosinophils Relative: 2 % (ref 0–5)
Lymphocytes Relative: 24 % (ref 12–46)
Lymphs Abs: 1.8 10*3/uL (ref 0.7–4.0)
Monocytes Absolute: 0.4 10*3/uL (ref 0.1–1.0)
Monocytes Relative: 6 % (ref 3–12)
Neutro Abs: 4.9 10*3/uL (ref 1.7–7.7)
Neutrophils Relative %: 67 % (ref 43–77)

## 2010-07-05 LAB — BASIC METABOLIC PANEL
BUN: 18 mg/dL (ref 6–23)
CO2: 32 mEq/L (ref 19–32)
Calcium: 10.8 mg/dL — ABNORMAL HIGH (ref 8.4–10.5)
Chloride: 101 mEq/L (ref 96–112)
Creatinine, Ser: 0.84 mg/dL (ref 0.4–1.2)
GFR calc Af Amer: >60 mL/min (ref >60)
GFR calc non Af Amer: >60 mL/min (ref >60)
Glucose, Bld: 76 mg/dL (ref 70–99)
Potassium: 3.3 mEq/L — ABNORMAL LOW (ref 3.5–5.1)
Sodium: 140 mEq/L (ref 135–145)

## 2010-07-05 LAB — CBC
HCT: 38.5 % (ref 36.0–46.0)
Hemoglobin: 12.5 g/dL (ref 12.0–15.0)
MCHC: 32.5 g/dL (ref 30.0–36.0)
MCV: 75.4 fL — ABNORMAL LOW (ref 78.0–100.0)
Platelets: 515 10*3/uL — ABNORMAL HIGH (ref 150–400)
RBC: 5.1 MIL/uL (ref 3.87–5.11)
RDW: 15.2 % (ref 11.5–15.5)
WBC: 7.4 10*3/uL (ref 4.0–10.5)

## 2010-07-05 LAB — PROTIME-INR
INR: 1.1 (ref 0.00–1.49)
Prothrombin Time: 13.7 seconds (ref 11.6–15.2)

## 2010-07-05 LAB — LACTATE DEHYDROGENASE: LDH: 177 U/L (ref 94–250)

## 2010-07-05 LAB — CEA: CEA: 1.2 ng/mL (ref 0.0–5.0)

## 2010-07-05 LAB — APTT: aPTT: 30 seconds (ref 24–37)

## 2010-07-05 LAB — CANCER ANTIGEN 27.29: CA 27.29: 45 U/mL — ABNORMAL HIGH (ref 0–39)

## 2010-07-08 ENCOUNTER — Encounter (HOSPITAL_BASED_OUTPATIENT_CLINIC_OR_DEPARTMENT_OTHER): Payer: Managed Care, Other (non HMO) | Admitting: Oncology

## 2010-07-08 ENCOUNTER — Other Ambulatory Visit: Payer: Self-pay | Admitting: Oncology

## 2010-07-08 ENCOUNTER — Ambulatory Visit (HOSPITAL_COMMUNITY)
Admission: RE | Admit: 2010-07-08 | Discharge: 2010-07-08 | Disposition: A | Discharge status: Home / Self Care | Payer: Managed Care, Other (non HMO) | Source: Ambulatory Visit | Attending: Oncology | Admitting: Oncology

## 2010-07-08 DIAGNOSIS — C50919 Malignant neoplasm of unspecified site of unspecified female breast: Secondary | ICD-10-CM | POA: Insufficient documentation

## 2010-07-08 DIAGNOSIS — Z853 Personal history of malignant neoplasm of breast: Secondary | ICD-10-CM

## 2010-07-08 DIAGNOSIS — R0609 Other forms of dyspnea: Secondary | ICD-10-CM | POA: Insufficient documentation

## 2010-07-08 DIAGNOSIS — C50419 Malignant neoplasm of upper-outer quadrant of unspecified female breast: Secondary | ICD-10-CM

## 2010-07-08 DIAGNOSIS — D6859 Other primary thrombophilia: Secondary | ICD-10-CM

## 2010-07-08 DIAGNOSIS — R0989 Other specified symptoms and signs involving the circulatory and respiratory systems: Secondary | ICD-10-CM | POA: Insufficient documentation

## 2010-07-08 DIAGNOSIS — D47Z9 Other specified neoplasms of uncertain behavior of lymphoid, hematopoietic and related tissue: Secondary | ICD-10-CM

## 2010-07-08 DIAGNOSIS — Z7901 Long term (current) use of anticoagulants: Secondary | ICD-10-CM

## 2010-07-08 LAB — CBC & DIFF AND RETIC
BASO%: 0.8 % (ref 0.0–2.0)
Basophils Absolute: 0.1 10*3/uL (ref 0.0–0.1)
EOS%: 2.9 % (ref 0.0–7.0)
Eosinophils Absolute: 0.2 10*3/uL (ref 0.0–0.5)
HCT: 36.6 % (ref 34.8–46.6)
HGB: 11.4 g/dL — ABNORMAL LOW (ref 11.6–15.9)
Immature Retic Fract: 10.2 % (ref 0.00–10.70)
LYMPH%: 20.5 % (ref 14.0–49.7)
MCH: 22.5 pg — ABNORMAL LOW (ref 25.1–34.0)
MCHC: 31.1 g/dL — ABNORMAL LOW (ref 31.5–36.0)
MCV: 72.3 fL — ABNORMAL LOW (ref 79.5–101.0)
MONO#: 0.5 10*3/uL (ref 0.1–0.9)
MONO%: 7.1 % (ref 0.0–14.0)
NEUT#: 4.5 10*3/uL (ref 1.5–6.5)
NEUT%: 68.7 % (ref 38.4–76.8)
Platelets: 404 10*3/uL — ABNORMAL HIGH (ref 145–400)
RBC: 5.06 10*6/uL (ref 3.70–5.45)
RDW: 16.7 % — ABNORMAL HIGH (ref 11.2–14.5)
Retic %: 0.94 % (ref 0.50–1.50)
Retic Ct Abs: 47.56 10*3/uL (ref 18.30–72.70)
WBC: 6.5 10*3/uL (ref 3.9–10.3)
lymph#: 1.3 10*3/uL (ref 0.9–3.3)
nRBC: 0 % (ref 0–0)

## 2010-07-08 LAB — IRON AND TIBC
%SAT: 6 % — ABNORMAL LOW (ref 20–55)
Iron: 27 ug/dL — ABNORMAL LOW (ref 42–145)
TIBC: 435 ug/dL (ref 250–470)
UIBC: 408 ug/dL

## 2010-07-08 LAB — FERRITIN: Ferritin: 8 ng/mL — ABNORMAL LOW (ref 10–291)

## 2010-07-08 LAB — PROTIME-INR
INR: 2.6 (ref 2.00–3.50)
Protime: 31.2 Seconds — ABNORMAL HIGH (ref 10.6–13.4)

## 2010-07-08 LAB — MORPHOLOGY: PLT EST: ADEQUATE

## 2010-07-09 LAB — CBC
HCT: 24.7 % — ABNORMAL LOW (ref 36.0–46.0)
HCT: 24.7 % — ABNORMAL LOW (ref 36.0–46.0)
HCT: 26.6 % — ABNORMAL LOW (ref 36.0–46.0)
HCT: 27.1 % — ABNORMAL LOW (ref 36.0–46.0)
HCT: 27.2 % — ABNORMAL LOW (ref 36.0–46.0)
HCT: 28.1 % — ABNORMAL LOW (ref 36.0–46.0)
HCT: 29.9 % — ABNORMAL LOW (ref 36.0–46.0)
HCT: 30.2 % — ABNORMAL LOW (ref 36.0–46.0)
HCT: 30.9 % — ABNORMAL LOW (ref 36.0–46.0)
HCT: 37.3 % (ref 36.0–46.0)
HCT: 42.5 % (ref 36.0–46.0)
Hemoglobin: 10 g/dL — ABNORMAL LOW (ref 12.0–15.0)
Hemoglobin: 10.2 g/dL — ABNORMAL LOW (ref 12.0–15.0)
Hemoglobin: 12.1 g/dL (ref 12.0–15.0)
Hemoglobin: 8.3 g/dL — ABNORMAL LOW (ref 12.0–15.0)
Hemoglobin: 8.4 g/dL — ABNORMAL LOW (ref 12.0–15.0)
Hemoglobin: 8.7 g/dL — ABNORMAL LOW (ref 12.0–15.0)
Hemoglobin: 9.1 g/dL — ABNORMAL LOW (ref 12.0–15.0)
Hemoglobin: 9.1 g/dL — ABNORMAL LOW (ref 12.0–15.0)
Hemoglobin: 9.3 g/dL — ABNORMAL LOW (ref 12.0–15.0)
Hemoglobin: 9.9 g/dL — ABNORMAL LOW (ref 12.0–15.0)
Hemoglobin: 14.6 g/dL (ref 12.0–15.0)
MCHC: 32.5 g/dL (ref 30.0–36.0)
MCHC: 32.8 g/dL (ref 30.0–36.0)
MCHC: 33 g/dL (ref 30.0–36.0)
MCHC: 33 g/dL (ref 30.0–36.0)
MCHC: 33.2 g/dL (ref 30.0–36.0)
MCHC: 33.3 g/dL (ref 30.0–36.0)
MCHC: 33.4 g/dL (ref 30.0–36.0)
MCHC: 33.5 g/dL (ref 30.0–36.0)
MCHC: 33.7 g/dL (ref 30.0–36.0)
MCHC: 33.9 g/dL (ref 30.0–36.0)
MCHC: 34.3 g/dL (ref 30.0–36.0)
MCV: 83.2 fL (ref 78.0–100.0)
MCV: 83.5 fL (ref 78.0–100.0)
MCV: 83.6 fL (ref 78.0–100.0)
MCV: 83.9 fL (ref 78.0–100.0)
MCV: 84 fL (ref 78.0–100.0)
MCV: 84.1 fL (ref 78.0–100.0)
MCV: 84.5 fL (ref 78.0–100.0)
MCV: 84.6 fL (ref 78.0–100.0)
MCV: 84.7 fL (ref 78.0–100.0)
MCV: 85 fL (ref 78.0–100.0)
MCV: 83.9 fL (ref 78.0–100.0)
Platelets: 372 10*3/uL (ref 150–400)
Platelets: 376 10*3/uL (ref 150–400)
Platelets: 420 10*3/uL — ABNORMAL HIGH (ref 150–400)
Platelets: 427 10*3/uL — ABNORMAL HIGH (ref 150–400)
Platelets: 440 10*3/uL — ABNORMAL HIGH (ref 150–400)
Platelets: 471 10*3/uL — ABNORMAL HIGH (ref 150–400)
Platelets: 476 10*3/uL — ABNORMAL HIGH (ref 150–400)
Platelets: 554 10*3/uL — ABNORMAL HIGH (ref 150–400)
Platelets: 569 10*3/uL — ABNORMAL HIGH (ref 150–400)
Platelets: 705 10*3/uL — ABNORMAL HIGH (ref 150–400)
Platelets: 449 10*3/uL — ABNORMAL HIGH (ref 150–400)
RBC: 2.92 MIL/uL — ABNORMAL LOW (ref 3.87–5.11)
RBC: 2.94 MIL/uL — ABNORMAL LOW (ref 3.87–5.11)
RBC: 3.14 MIL/uL — ABNORMAL LOW (ref 3.87–5.11)
RBC: 3.21 MIL/uL — ABNORMAL LOW (ref 3.87–5.11)
RBC: 3.26 MIL/uL — ABNORMAL LOW (ref 3.87–5.11)
RBC: 3.34 MIL/uL — ABNORMAL LOW (ref 3.87–5.11)
RBC: 3.55 MIL/uL — ABNORMAL LOW (ref 3.87–5.11)
RBC: 3.57 MIL/uL — ABNORMAL LOW (ref 3.87–5.11)
RBC: 3.67 MIL/uL — ABNORMAL LOW (ref 3.87–5.11)
RBC: 4.48 MIL/uL (ref 3.87–5.11)
RBC: 5.07 MIL/uL (ref 3.87–5.11)
RDW: 14.2 % (ref 11.5–15.5)
RDW: 14.3 % (ref 11.5–15.5)
RDW: 14.3 % (ref 11.5–15.5)
RDW: 14.3 % (ref 11.5–15.5)
RDW: 14.4 % (ref 11.5–15.5)
RDW: 14.6 % (ref 11.5–15.5)
RDW: 14.9 % (ref 11.5–15.5)
RDW: 14.9 % (ref 11.5–15.5)
RDW: 14.9 % (ref 11.5–15.5)
RDW: 15 % (ref 11.5–15.5)
RDW: 14.8 % (ref 11.5–15.5)
WBC: 11.9 10*3/uL — ABNORMAL HIGH (ref 4.0–10.5)
WBC: 19 10*3/uL — ABNORMAL HIGH (ref 4.0–10.5)
WBC: 6.4 10*3/uL (ref 4.0–10.5)
WBC: 6.5 10*3/uL (ref 4.0–10.5)
WBC: 7.2 10*3/uL (ref 4.0–10.5)
WBC: 7.7 10*3/uL (ref 4.0–10.5)
WBC: 7.8 10*3/uL (ref 4.0–10.5)
WBC: 8.5 10*3/uL (ref 4.0–10.5)
WBC: 8.8 10*3/uL (ref 4.0–10.5)
WBC: 9.5 10*3/uL (ref 4.0–10.5)
WBC: 7 10*3/uL (ref 4.0–10.5)

## 2010-07-09 LAB — BASIC METABOLIC PANEL
BUN: 14 mg/dL (ref 6–23)
BUN: 17 mg/dL (ref 6–23)
BUN: 20 mg/dL (ref 6–23)
BUN: 4 mg/dL — ABNORMAL LOW (ref 6–23)
BUN: 5 mg/dL — ABNORMAL LOW (ref 6–23)
BUN: 6 mg/dL (ref 6–23)
CO2: 28 mEq/L (ref 19–32)
CO2: 29 mEq/L (ref 19–32)
CO2: 30 mEq/L (ref 19–32)
CO2: 31 mEq/L (ref 19–32)
CO2: 31 mEq/L (ref 19–32)
CO2: 31 mEq/L (ref 19–32)
Calcium: 7.9 mg/dL — ABNORMAL LOW (ref 8.4–10.5)
Calcium: 8.4 mg/dL (ref 8.4–10.5)
Calcium: 8.9 mg/dL (ref 8.4–10.5)
Calcium: 9 mg/dL (ref 8.4–10.5)
Calcium: 9.2 mg/dL (ref 8.4–10.5)
Calcium: 9.9 mg/dL (ref 8.4–10.5)
Chloride: 100 mEq/L (ref 96–112)
Chloride: 103 mEq/L (ref 96–112)
Chloride: 103 mEq/L (ref 96–112)
Chloride: 99 mEq/L (ref 96–112)
Chloride: 99 mEq/L (ref 96–112)
Chloride: 99 mEq/L (ref 96–112)
Creatinine, Ser: 0.71 mg/dL (ref 0.4–1.2)
Creatinine, Ser: 0.75 mg/dL (ref 0.4–1.2)
Creatinine, Ser: 0.76 mg/dL (ref 0.4–1.2)
Creatinine, Ser: 1.03 mg/dL (ref 0.4–1.2)
Creatinine, Ser: 1.07 mg/dL (ref 0.4–1.2)
Creatinine, Ser: 1.1 mg/dL (ref 0.4–1.2)
GFR calc Af Amer: >60 mL/min (ref >60)
GFR calc Af Amer: >60 mL/min (ref >60)
GFR calc Af Amer: >60 mL/min (ref >60)
GFR calc Af Amer: >60 mL/min (ref >60)
GFR calc Af Amer: >60 mL/min (ref >60)
GFR calc Af Amer: >60 mL/min (ref >60)
GFR calc non Af Amer: 50 mL/min — ABNORMAL LOW (ref >60)
GFR calc non Af Amer: 51 mL/min — ABNORMAL LOW (ref >60)
GFR calc non Af Amer: 54 mL/min — ABNORMAL LOW (ref >60)
GFR calc non Af Amer: >60 mL/min (ref >60)
GFR calc non Af Amer: >60 mL/min (ref >60)
GFR calc non Af Amer: >60 mL/min (ref >60)
Glucose, Bld: 104 mg/dL — ABNORMAL HIGH (ref 70–99)
Glucose, Bld: 106 mg/dL — ABNORMAL HIGH (ref 70–99)
Glucose, Bld: 115 mg/dL — ABNORMAL HIGH (ref 70–99)
Glucose, Bld: 137 mg/dL — ABNORMAL HIGH (ref 70–99)
Glucose, Bld: 86 mg/dL (ref 70–99)
Glucose, Bld: 97 mg/dL (ref 70–99)
Potassium: 3.4 mEq/L — ABNORMAL LOW (ref 3.5–5.1)
Potassium: 3.5 mEq/L (ref 3.5–5.1)
Potassium: 3.7 mEq/L (ref 3.5–5.1)
Potassium: 4 mEq/L (ref 3.5–5.1)
Potassium: 4.1 mEq/L (ref 3.5–5.1)
Potassium: 4.4 mEq/L (ref 3.5–5.1)
Sodium: 135 mEq/L (ref 135–145)
Sodium: 136 mEq/L (ref 135–145)
Sodium: 137 mEq/L (ref 135–145)
Sodium: 137 mEq/L (ref 135–145)
Sodium: 137 mEq/L (ref 135–145)
Sodium: 138 mEq/L (ref 135–145)

## 2010-07-09 LAB — DIFFERENTIAL
Basophils Absolute: 0 10*3/uL (ref 0.0–0.1)
Basophils Absolute: 0.1 10*3/uL (ref 0.0–0.1)
Basophils Absolute: 0 10*3/uL (ref 0.0–0.1)
Basophils Relative: 0 % (ref 0–1)
Basophils Relative: 1 % (ref 0–1)
Basophils Relative: 1 % (ref 0–1)
Eosinophils Absolute: 0.1 10*3/uL (ref 0.0–0.7)
Eosinophils Absolute: 0.2 10*3/uL (ref 0.0–0.7)
Eosinophils Absolute: 0.2 10*3/uL (ref 0.0–0.7)
Eosinophils Relative: 1 % (ref 0–5)
Eosinophils Relative: 1 % (ref 0–5)
Eosinophils Relative: 3 % (ref 0–5)
Lymphocytes Relative: 10 % — ABNORMAL LOW (ref 12–46)
Lymphocytes Relative: 6 % — ABNORMAL LOW (ref 12–46)
Lymphocytes Relative: 21 % (ref 12–46)
Lymphs Abs: 0.7 10*3/uL (ref 0.7–4.0)
Lymphs Abs: 1.1 10*3/uL (ref 0.7–4.0)
Lymphs Abs: 1.4 10*3/uL (ref 0.7–4.0)
Monocytes Absolute: 0.3 10*3/uL (ref 0.1–1.0)
Monocytes Absolute: 0.4 10*3/uL (ref 0.1–1.0)
Monocytes Absolute: 0.4 10*3/uL (ref 0.1–1.0)
Monocytes Relative: 2 % — ABNORMAL LOW (ref 3–12)
Monocytes Relative: 4 % (ref 3–12)
Monocytes Relative: 5 % (ref 3–12)
Neutro Abs: 17.1 10*3/uL — ABNORMAL HIGH (ref 1.7–7.7)
Neutro Abs: 6.6 10*3/uL (ref 1.7–7.7)
Neutro Abs: 5 10*3/uL (ref 1.7–7.7)
Neutrophils Relative %: 85 % — ABNORMAL HIGH (ref 43–77)
Neutrophils Relative %: 90 % — ABNORMAL HIGH (ref 43–77)
Neutrophils Relative %: 71 % (ref 43–77)

## 2010-07-09 LAB — HEMOGLOBIN AND HEMATOCRIT, BLOOD
HCT: 26.2 % — ABNORMAL LOW (ref 36.0–46.0)
HCT: 28.3 % — ABNORMAL LOW (ref 36.0–46.0)
Hemoglobin: 8.7 g/dL — ABNORMAL LOW (ref 12.0–15.0)
Hemoglobin: 9.4 g/dL — ABNORMAL LOW (ref 12.0–15.0)

## 2010-07-09 LAB — TYPE AND SCREEN
ABO/RH(D): O POS
ABO/RH(D): O POS
Antibody Screen: NEGATIVE
Antibody Screen: NEGATIVE

## 2010-07-09 LAB — COMPREHENSIVE METABOLIC PANEL
ALT: 14 U/L (ref 0–35)
AST: 17 U/L (ref 0–37)
Albumin: 3.7 g/dL (ref 3.5–5.2)
Alkaline Phosphatase: 48 U/L (ref 39–117)
BUN: 19 mg/dL (ref 6–23)
CO2: 35 mEq/L — ABNORMAL HIGH (ref 19–32)
Calcium: 9.7 mg/dL (ref 8.4–10.5)
Chloride: 102 mEq/L (ref 96–112)
Creatinine, Ser: 0.95 mg/dL (ref 0.4–1.2)
GFR calc Af Amer: >60 mL/min (ref >60)
GFR calc non Af Amer: 59 mL/min — ABNORMAL LOW (ref >60)
Glucose, Bld: 62 mg/dL — ABNORMAL LOW (ref 70–99)
Potassium: 3.2 mEq/L — ABNORMAL LOW (ref 3.5–5.1)
Sodium: 142 mEq/L (ref 135–145)
Total Bilirubin: 0.8 mg/dL (ref 0.3–1.2)
Total Protein: 6.6 g/dL (ref 6.0–8.3)

## 2010-07-09 LAB — PROTIME-INR
INR: 1.1 (ref 0.00–1.49)
INR: 1.1 (ref 0.00–1.49)
INR: 1.2 (ref 0.00–1.49)
INR: 2.3 — ABNORMAL HIGH (ref 0.00–1.49)
INR: 3.3 — ABNORMAL HIGH (ref 0.00–1.49)
INR: 3.6 — ABNORMAL HIGH (ref 0.00–1.49)
Prothrombin Time: 14.1 seconds (ref 11.6–15.2)
Prothrombin Time: 14.3 seconds (ref 11.6–15.2)
Prothrombin Time: 15.1 seconds (ref 11.6–15.2)
Prothrombin Time: 26.8 seconds — ABNORMAL HIGH (ref 11.6–15.2)
Prothrombin Time: 36.2 seconds — ABNORMAL HIGH (ref 11.6–15.2)
Prothrombin Time: 38.7 seconds — ABNORMAL HIGH (ref 11.6–15.2)

## 2010-07-09 LAB — PREPARE RBC (CROSSMATCH)

## 2010-07-09 LAB — APTT
aPTT: 35 seconds (ref 24–37)
aPTT: 108 seconds — ABNORMAL HIGH (ref 24–37)

## 2010-07-09 LAB — ABO/RH: ABO/RH(D): O POS

## 2010-07-15 LAB — COMPREHENSIVE METABOLIC PANEL
ALT: 12 U/L (ref 0–35)
ALT: 22 U/L (ref 0–35)
ALT: 25 U/L (ref 0–35)
ALT: 35 U/L (ref 0–35)
AST: 14 U/L (ref 0–37)
AST: 17 U/L (ref 0–37)
AST: 20 U/L (ref 0–37)
AST: 30 U/L (ref 0–37)
Albumin: 1.9 g/dL — ABNORMAL LOW (ref 3.5–5.2)
Albumin: 3.8 g/dL (ref 3.5–5.2)
Albumin: 4.2 g/dL (ref 3.5–5.2)
Albumin: 2 g/dL — ABNORMAL LOW (ref 3.5–5.2)
Alkaline Phosphatase: 41 U/L (ref 39–117)
Alkaline Phosphatase: 41 U/L (ref 39–117)
Alkaline Phosphatase: 69 U/L (ref 39–117)
Alkaline Phosphatase: 78 U/L (ref 39–117)
BUN: 18 mg/dL (ref 6–23)
BUN: 21 mg/dL (ref 6–23)
BUN: 7 mg/dL (ref 6–23)
BUN: 13 mg/dL (ref 6–23)
CO2: 21 mEq/L (ref 19–32)
CO2: 26 mEq/L (ref 19–32)
CO2: 27 mEq/L (ref 19–32)
CO2: 27 mEq/L (ref 19–32)
Calcium: 7.4 mg/dL — ABNORMAL LOW (ref 8.4–10.5)
Calcium: 9.4 mg/dL (ref 8.4–10.5)
Calcium: 9.8 mg/dL (ref 8.4–10.5)
Calcium: 8.3 mg/dL — ABNORMAL LOW (ref 8.4–10.5)
Chloride: 101 mEq/L (ref 96–112)
Chloride: 98 mEq/L (ref 96–112)
Chloride: 99 mEq/L (ref 96–112)
Chloride: 102 mEq/L (ref 96–112)
Creatinine, Ser: 0.8 mg/dL (ref 0.4–1.2)
Creatinine, Ser: 1.08 mg/dL (ref 0.4–1.2)
Creatinine, Ser: 1.17 mg/dL (ref 0.4–1.2)
Creatinine, Ser: 0.69 mg/dL (ref 0.4–1.2)
GFR calc Af Amer: 56 mL/min — ABNORMAL LOW (ref >60)
GFR calc Af Amer: >60 mL/min (ref >60)
GFR calc Af Amer: >60 mL/min (ref >60)
GFR calc Af Amer: >60 mL/min (ref >60)
GFR calc non Af Amer: 46 mL/min — ABNORMAL LOW (ref >60)
GFR calc non Af Amer: 51 mL/min — ABNORMAL LOW (ref >60)
GFR calc non Af Amer: >60 mL/min (ref >60)
GFR calc non Af Amer: >60 mL/min (ref >60)
Glucose, Bld: 103 mg/dL — ABNORMAL HIGH (ref 70–99)
Glucose, Bld: 108 mg/dL — ABNORMAL HIGH (ref 70–99)
Glucose, Bld: 109 mg/dL — ABNORMAL HIGH (ref 70–99)
Glucose, Bld: 131 mg/dL — ABNORMAL HIGH (ref 70–99)
Potassium: 3 mEq/L — ABNORMAL LOW (ref 3.5–5.1)
Potassium: 3.1 mEq/L — ABNORMAL LOW (ref 3.5–5.1)
Potassium: 3.7 mEq/L (ref 3.5–5.1)
Potassium: 3.8 mEq/L (ref 3.5–5.1)
Sodium: 129 mEq/L — ABNORMAL LOW (ref 135–145)
Sodium: 136 mEq/L (ref 135–145)
Sodium: 138 mEq/L (ref 135–145)
Sodium: 136 mEq/L (ref 135–145)
Total Bilirubin: 0.9 mg/dL (ref 0.3–1.2)
Total Bilirubin: 1.1 mg/dL (ref 0.3–1.2)
Total Bilirubin: 1.1 mg/dL (ref 0.3–1.2)
Total Bilirubin: 0.6 mg/dL (ref 0.3–1.2)
Total Protein: 4.9 g/dL — ABNORMAL LOW (ref 6.0–8.3)
Total Protein: 6.6 g/dL (ref 6.0–8.3)
Total Protein: 6.8 g/dL (ref 6.0–8.3)
Total Protein: 4.9 g/dL — ABNORMAL LOW (ref 6.0–8.3)

## 2010-07-15 LAB — URINALYSIS, ROUTINE W REFLEX MICROSCOPIC
Bilirubin Urine: NEGATIVE
Bilirubin Urine: NEGATIVE
Glucose, UA: NEGATIVE mg/dL
Glucose, UA: NEGATIVE mg/dL
Hgb urine dipstick: NEGATIVE
Hgb urine dipstick: NEGATIVE
Ketones, ur: NEGATIVE mg/dL
Ketones, ur: NEGATIVE mg/dL
Nitrite: NEGATIVE
Nitrite: NEGATIVE
Protein, ur: NEGATIVE mg/dL
Protein, ur: NEGATIVE mg/dL
Specific Gravity, Urine: 1.019 (ref 1.005–1.030)
Specific Gravity, Urine: 1.012 (ref 1.005–1.030)
Urobilinogen, UA: 1 mg/dL (ref 0.0–1.0)
Urobilinogen, UA: 1 mg/dL (ref 0.0–1.0)
pH: 8 (ref 5.0–8.0)
pH: 7.5 (ref 5.0–8.0)

## 2010-07-15 LAB — GLUCOSE, CAPILLARY
Glucose-Capillary: 117 mg/dL — ABNORMAL HIGH (ref 70–99)
Glucose-Capillary: 118 mg/dL — ABNORMAL HIGH (ref 70–99)
Glucose-Capillary: 120 mg/dL — ABNORMAL HIGH (ref 70–99)
Glucose-Capillary: 126 mg/dL — ABNORMAL HIGH (ref 70–99)
Glucose-Capillary: 128 mg/dL — ABNORMAL HIGH (ref 70–99)
Glucose-Capillary: 132 mg/dL — ABNORMAL HIGH (ref 70–99)
Glucose-Capillary: 144 mg/dL — ABNORMAL HIGH (ref 70–99)
Glucose-Capillary: 148 mg/dL — ABNORMAL HIGH (ref 70–99)
Glucose-Capillary: 102 mg/dL — ABNORMAL HIGH (ref 70–99)
Glucose-Capillary: 102 mg/dL — ABNORMAL HIGH (ref 70–99)
Glucose-Capillary: 103 mg/dL — ABNORMAL HIGH (ref 70–99)
Glucose-Capillary: 103 mg/dL — ABNORMAL HIGH (ref 70–99)
Glucose-Capillary: 107 mg/dL — ABNORMAL HIGH (ref 70–99)
Glucose-Capillary: 110 mg/dL — ABNORMAL HIGH (ref 70–99)
Glucose-Capillary: 118 mg/dL — ABNORMAL HIGH (ref 70–99)
Glucose-Capillary: 118 mg/dL — ABNORMAL HIGH (ref 70–99)
Glucose-Capillary: 118 mg/dL — ABNORMAL HIGH (ref 70–99)
Glucose-Capillary: 120 mg/dL — ABNORMAL HIGH (ref 70–99)
Glucose-Capillary: 123 mg/dL — ABNORMAL HIGH (ref 70–99)
Glucose-Capillary: 128 mg/dL — ABNORMAL HIGH (ref 70–99)
Glucose-Capillary: 131 mg/dL — ABNORMAL HIGH (ref 70–99)
Glucose-Capillary: 133 mg/dL — ABNORMAL HIGH (ref 70–99)
Glucose-Capillary: 137 mg/dL — ABNORMAL HIGH (ref 70–99)
Glucose-Capillary: 87 mg/dL (ref 70–99)
Glucose-Capillary: 92 mg/dL (ref 70–99)

## 2010-07-15 LAB — TYPE AND SCREEN
ABO/RH(D): O POS
Antibody Screen: NEGATIVE

## 2010-07-15 LAB — DIFFERENTIAL
Basophils Absolute: 0 10*3/uL (ref 0.0–0.1)
Basophils Absolute: 0 10*3/uL (ref 0.0–0.1)
Basophils Absolute: 0.1 10*3/uL (ref 0.0–0.1)
Basophils Absolute: 0.1 10*3/uL (ref 0.0–0.1)
Basophils Relative: 0 % (ref 0–1)
Basophils Relative: 0 % (ref 0–1)
Basophils Relative: 0 % (ref 0–1)
Basophils Relative: 0 % (ref 0–1)
Eosinophils Absolute: 0.2 10*3/uL (ref 0.0–0.7)
Eosinophils Absolute: 0.2 10*3/uL (ref 0.0–0.7)
Eosinophils Absolute: 0.3 10*3/uL (ref 0.0–0.7)
Eosinophils Absolute: 0.4 10*3/uL (ref 0.0–0.7)
Eosinophils Relative: 1 % (ref 0–5)
Eosinophils Relative: 1 % (ref 0–5)
Eosinophils Relative: 1 % (ref 0–5)
Eosinophils Relative: 3 % (ref 0–5)
Lymphocytes Relative: 11 % — ABNORMAL LOW (ref 12–46)
Lymphocytes Relative: 3 % — ABNORMAL LOW (ref 12–46)
Lymphocytes Relative: 4 % — ABNORMAL LOW (ref 12–46)
Lymphocytes Relative: 5 % — ABNORMAL LOW (ref 12–46)
Lymphs Abs: 0.7 10*3/uL (ref 0.7–4.0)
Lymphs Abs: 0.7 10*3/uL (ref 0.7–4.0)
Lymphs Abs: 0.9 10*3/uL (ref 0.7–4.0)
Lymphs Abs: 2.6 10*3/uL (ref 0.7–4.0)
Monocytes Absolute: 0.2 10*3/uL (ref 0.1–1.0)
Monocytes Absolute: 0.2 10*3/uL (ref 0.1–1.0)
Monocytes Absolute: 0.8 10*3/uL (ref 0.1–1.0)
Monocytes Absolute: 0.9 10*3/uL (ref 0.1–1.0)
Monocytes Relative: 1 % — ABNORMAL LOW (ref 3–12)
Monocytes Relative: 1 % — ABNORMAL LOW (ref 3–12)
Monocytes Relative: 3 % (ref 3–12)
Monocytes Relative: 4 % (ref 3–12)
Neutro Abs: 12.1 10*3/uL — ABNORMAL HIGH (ref 1.7–7.7)
Neutro Abs: 20.1 10*3/uL — ABNORMAL HIGH (ref 1.7–7.7)
Neutro Abs: 21.4 10*3/uL — ABNORMAL HIGH (ref 1.7–7.7)
Neutro Abs: 21.7 10*3/uL — ABNORMAL HIGH (ref 1.7–7.7)
Neutrophils Relative %: 84 % — ABNORMAL HIGH (ref 43–77)
Neutrophils Relative %: 91 % — ABNORMAL HIGH (ref 43–77)
Neutrophils Relative %: 92 % — ABNORMAL HIGH (ref 43–77)
Neutrophils Relative %: 95 % — ABNORMAL HIGH (ref 43–77)
WBC Morphology: INCREASED

## 2010-07-15 LAB — LUPUS ANTICOAGULANT PANEL
DRVVT: 44 secs (ref 36.1–47.0)
Lupus Anticoagulant: NOT DETECTED
PTT Lupus Anticoagulant: 45.3 secs (ref 36.3–48.8)

## 2010-07-15 LAB — URINE CULTURE: Colony Count: 15000

## 2010-07-15 LAB — CBC
HCT: 31.9 % — ABNORMAL LOW (ref 36.0–46.0)
HCT: 35.6 % — ABNORMAL LOW (ref 36.0–46.0)
HCT: 38 % (ref 36.0–46.0)
HCT: 38.3 % (ref 36.0–46.0)
HCT: 39.1 % (ref 36.0–46.0)
HCT: 41 % (ref 36.0–46.0)
HCT: 43.5 % (ref 36.0–46.0)
HCT: 44.7 % (ref 36.0–46.0)
HCT: 49.9 % — ABNORMAL HIGH (ref 36.0–46.0)
HCT: 50.4 % — ABNORMAL HIGH (ref 36.0–46.0)
HCT: 34.1 % — ABNORMAL LOW (ref 36.0–46.0)
HCT: 35.4 % — ABNORMAL LOW (ref 36.0–46.0)
HCT: 38.3 % (ref 36.0–46.0)
Hemoglobin: 10.6 g/dL — ABNORMAL LOW (ref 12.0–15.0)
Hemoglobin: 11.8 g/dL — ABNORMAL LOW (ref 12.0–15.0)
Hemoglobin: 12.7 g/dL (ref 12.0–15.0)
Hemoglobin: 12.8 g/dL (ref 12.0–15.0)
Hemoglobin: 12.8 g/dL (ref 12.0–15.0)
Hemoglobin: 13.7 g/dL (ref 12.0–15.0)
Hemoglobin: 14.5 g/dL (ref 12.0–15.0)
Hemoglobin: 14.5 g/dL (ref 12.0–15.0)
Hemoglobin: 16.5 g/dL — ABNORMAL HIGH (ref 12.0–15.0)
Hemoglobin: 16.8 g/dL — ABNORMAL HIGH (ref 12.0–15.0)
Hemoglobin: 11.3 g/dL — ABNORMAL LOW (ref 12.0–15.0)
Hemoglobin: 11.5 g/dL — ABNORMAL LOW (ref 12.0–15.0)
Hemoglobin: 12.6 g/dL (ref 12.0–15.0)
MCHC: 32.4 g/dL (ref 30.0–36.0)
MCHC: 32.5 g/dL (ref 30.0–36.0)
MCHC: 32.8 g/dL (ref 30.0–36.0)
MCHC: 33.2 g/dL (ref 30.0–36.0)
MCHC: 33.3 g/dL (ref 30.0–36.0)
MCHC: 33.4 g/dL (ref 30.0–36.0)
MCHC: 33.4 g/dL (ref 30.0–36.0)
MCHC: 33.5 g/dL (ref 30.0–36.0)
MCHC: 33.7 g/dL (ref 30.0–36.0)
MCHC: 33.8 g/dL (ref 30.0–36.0)
MCHC: 32.4 g/dL (ref 30.0–36.0)
MCHC: 33 g/dL (ref 30.0–36.0)
MCHC: 33 g/dL (ref 30.0–36.0)
MCV: 83.9 fL (ref 78.0–100.0)
MCV: 84.2 fL (ref 78.0–100.0)
MCV: 84.3 fL (ref 78.0–100.0)
MCV: 84.5 fL (ref 78.0–100.0)
MCV: 84.5 fL (ref 78.0–100.0)
MCV: 84.6 fL (ref 78.0–100.0)
MCV: 84.7 fL (ref 78.0–100.0)
MCV: 85.2 fL (ref 78.0–100.0)
MCV: 85.7 fL (ref 78.0–100.0)
MCV: 86.8 fL (ref 78.0–100.0)
MCV: 84.7 fL (ref 78.0–100.0)
MCV: 84.9 fL (ref 78.0–100.0)
MCV: 85.2 fL (ref 78.0–100.0)
Platelets: 316 10*3/uL (ref 150–400)
Platelets: 325 10*3/uL (ref 150–400)
Platelets: 335 10*3/uL (ref 150–400)
Platelets: 346 10*3/uL (ref 150–400)
Platelets: 363 10*3/uL (ref 150–400)
Platelets: 388 10*3/uL (ref 150–400)
Platelets: 402 10*3/uL — ABNORMAL HIGH (ref 150–400)
Platelets: 438 10*3/uL — ABNORMAL HIGH (ref 150–400)
Platelets: 555 10*3/uL — ABNORMAL HIGH (ref 150–400)
Platelets: 560 10*3/uL — ABNORMAL HIGH (ref 150–400)
Platelets: 342 10*3/uL (ref 150–400)
Platelets: 425 10*3/uL — ABNORMAL HIGH (ref 150–400)
Platelets: 512 10*3/uL — ABNORMAL HIGH (ref 150–400)
RBC: 3.78 MIL/uL — ABNORMAL LOW (ref 3.87–5.11)
RBC: 4.21 MIL/uL (ref 3.87–5.11)
RBC: 4.5 MIL/uL (ref 3.87–5.11)
RBC: 4.52 MIL/uL (ref 3.87–5.11)
RBC: 4.53 MIL/uL (ref 3.87–5.11)
RBC: 4.82 MIL/uL (ref 3.87–5.11)
RBC: 5.15 MIL/uL — ABNORMAL HIGH (ref 3.87–5.11)
RBC: 5.22 MIL/uL — ABNORMAL HIGH (ref 3.87–5.11)
RBC: 5.95 MIL/uL — ABNORMAL HIGH (ref 3.87–5.11)
RBC: 5.96 MIL/uL — ABNORMAL HIGH (ref 3.87–5.11)
RBC: 4.01 MIL/uL (ref 3.87–5.11)
RBC: 4.17 MIL/uL (ref 3.87–5.11)
RBC: 4.52 MIL/uL (ref 3.87–5.11)
RDW: 14.4 % (ref 11.5–15.5)
RDW: 14.4 % (ref 11.5–15.5)
RDW: 14.5 % (ref 11.5–15.5)
RDW: 14.6 % (ref 11.5–15.5)
RDW: 14.6 % (ref 11.5–15.5)
RDW: 14.6 % (ref 11.5–15.5)
RDW: 14.7 % (ref 11.5–15.5)
RDW: 14.7 % (ref 11.5–15.5)
RDW: 14.7 % (ref 11.5–15.5)
RDW: 14.9 % (ref 11.5–15.5)
RDW: 14.3 % (ref 11.5–15.5)
RDW: 14.6 % (ref 11.5–15.5)
RDW: 14.8 % (ref 11.5–15.5)
WBC: 13.4 10*3/uL — ABNORMAL HIGH (ref 4.0–10.5)
WBC: 18.1 10*3/uL — ABNORMAL HIGH (ref 4.0–10.5)
WBC: 19.8 10*3/uL — ABNORMAL HIGH (ref 4.0–10.5)
WBC: 20.9 10*3/uL — ABNORMAL HIGH (ref 4.0–10.5)
WBC: 22.5 10*3/uL — ABNORMAL HIGH (ref 4.0–10.5)
WBC: 22.7 10*3/uL — ABNORMAL HIGH (ref 4.0–10.5)
WBC: 23.6 10*3/uL — ABNORMAL HIGH (ref 4.0–10.5)
WBC: 24 10*3/uL — ABNORMAL HIGH (ref 4.0–10.5)
WBC: 24.2 10*3/uL — ABNORMAL HIGH (ref 4.0–10.5)
WBC: 9.1 10*3/uL (ref 4.0–10.5)
WBC: 10.4 10*3/uL (ref 4.0–10.5)
WBC: 13.2 10*3/uL — ABNORMAL HIGH (ref 4.0–10.5)
WBC: 15.4 10*3/uL — ABNORMAL HIGH (ref 4.0–10.5)

## 2010-07-15 LAB — BASIC METABOLIC PANEL
BUN: 11 mg/dL (ref 6–23)
BUN: 12 mg/dL (ref 6–23)
BUN: 14 mg/dL (ref 6–23)
BUN: 17 mg/dL (ref 6–23)
BUN: 8 mg/dL (ref 6–23)
BUN: 9 mg/dL (ref 6–23)
BUN: 12 mg/dL (ref 6–23)
CO2: 22 mEq/L (ref 19–32)
CO2: 22 mEq/L (ref 19–32)
CO2: 25 mEq/L (ref 19–32)
CO2: 25 mEq/L (ref 19–32)
CO2: 26 mEq/L (ref 19–32)
CO2: 30 mEq/L (ref 19–32)
CO2: 30 mEq/L (ref 19–32)
Calcium: 7.2 mg/dL — ABNORMAL LOW (ref 8.4–10.5)
Calcium: 7.5 mg/dL — ABNORMAL LOW (ref 8.4–10.5)
Calcium: 7.8 mg/dL — ABNORMAL LOW (ref 8.4–10.5)
Calcium: 7.9 mg/dL — ABNORMAL LOW (ref 8.4–10.5)
Calcium: 8 mg/dL — ABNORMAL LOW (ref 8.4–10.5)
Calcium: 8.3 mg/dL — ABNORMAL LOW (ref 8.4–10.5)
Calcium: 8.3 mg/dL — ABNORMAL LOW (ref 8.4–10.5)
Chloride: 101 mEq/L (ref 96–112)
Chloride: 101 mEq/L (ref 96–112)
Chloride: 101 mEq/L (ref 96–112)
Chloride: 102 mEq/L (ref 96–112)
Chloride: 98 mEq/L (ref 96–112)
Chloride: 98 mEq/L (ref 96–112)
Chloride: 102 mEq/L (ref 96–112)
Creatinine, Ser: 0.71 mg/dL (ref 0.4–1.2)
Creatinine, Ser: 0.75 mg/dL (ref 0.4–1.2)
Creatinine, Ser: 0.86 mg/dL (ref 0.4–1.2)
Creatinine, Ser: 0.94 mg/dL (ref 0.4–1.2)
Creatinine, Ser: 0.99 mg/dL (ref 0.4–1.2)
Creatinine, Ser: 1.04 mg/dL (ref 0.4–1.2)
Creatinine, Ser: 0.61 mg/dL (ref 0.4–1.2)
GFR calc Af Amer: >60 mL/min (ref >60)
GFR calc Af Amer: >60 mL/min (ref >60)
GFR calc Af Amer: >60 mL/min (ref >60)
GFR calc Af Amer: >60 mL/min (ref >60)
GFR calc Af Amer: >60 mL/min (ref >60)
GFR calc Af Amer: >60 mL/min (ref >60)
GFR calc Af Amer: >60 mL/min (ref >60)
GFR calc non Af Amer: 53 mL/min — ABNORMAL LOW (ref >60)
GFR calc non Af Amer: 56 mL/min — ABNORMAL LOW (ref >60)
GFR calc non Af Amer: 60 mL/min — ABNORMAL LOW (ref >60)
GFR calc non Af Amer: >60 mL/min (ref >60)
GFR calc non Af Amer: >60 mL/min (ref >60)
GFR calc non Af Amer: >60 mL/min (ref >60)
GFR calc non Af Amer: >60 mL/min (ref >60)
Glucose, Bld: 103 mg/dL — ABNORMAL HIGH (ref 70–99)
Glucose, Bld: 113 mg/dL — ABNORMAL HIGH (ref 70–99)
Glucose, Bld: 114 mg/dL — ABNORMAL HIGH (ref 70–99)
Glucose, Bld: 148 mg/dL — ABNORMAL HIGH (ref 70–99)
Glucose, Bld: 148 mg/dL — ABNORMAL HIGH (ref 70–99)
Glucose, Bld: 88 mg/dL (ref 70–99)
Glucose, Bld: 113 mg/dL — ABNORMAL HIGH (ref 70–99)
Potassium: 3.2 mEq/L — ABNORMAL LOW (ref 3.5–5.1)
Potassium: 3.3 mEq/L — ABNORMAL LOW (ref 3.5–5.1)
Potassium: 3.7 mEq/L (ref 3.5–5.1)
Potassium: 3.8 mEq/L (ref 3.5–5.1)
Potassium: 4 mEq/L (ref 3.5–5.1)
Potassium: 4.2 mEq/L (ref 3.5–5.1)
Potassium: 3.8 mEq/L (ref 3.5–5.1)
Sodium: 128 mEq/L — ABNORMAL LOW (ref 135–145)
Sodium: 130 mEq/L — ABNORMAL LOW (ref 135–145)
Sodium: 132 mEq/L — ABNORMAL LOW (ref 135–145)
Sodium: 133 mEq/L — ABNORMAL LOW (ref 135–145)
Sodium: 136 mEq/L (ref 135–145)
Sodium: 137 mEq/L (ref 135–145)
Sodium: 137 mEq/L (ref 135–145)

## 2010-07-15 LAB — BETA-2-GLYCOPROTEIN I ABS, IGG/M/A
Beta-2 Glyco I IgG: 4 U/mL (ref <20)
Beta-2-Glycoprotein I IgA: <4 U/mL (ref <10)
Beta-2-Glycoprotein I IgM: <4 U/mL (ref <10)

## 2010-07-15 LAB — CULTURE, BLOOD (ROUTINE X 2)
Culture: NO GROWTH
Culture: NO GROWTH

## 2010-07-15 LAB — TRIGLYCERIDES
Triglycerides: 109 mg/dL (ref <150)
Triglycerides: 140 mg/dL (ref <150)

## 2010-07-15 LAB — PROTHROMBIN GENE MUTATION

## 2010-07-15 LAB — ANTITHROMBIN III: AntiThromb III Func: 221 % — ABNORMAL HIGH (ref 76–126)

## 2010-07-15 LAB — PROTEIN S, TOTAL: Protein S Ag, Total: 91 % (ref 70–140)

## 2010-07-15 LAB — LIPASE, BLOOD: Lipase: 25 U/L (ref 11–59)

## 2010-07-15 LAB — PROTEIN C ACTIVITY: Protein C Activity: 111 % (ref 75–133)

## 2010-07-15 LAB — MAGNESIUM
Magnesium: 1.9 mg/dL (ref 1.5–2.5)
Magnesium: 1.9 mg/dL (ref 1.5–2.5)

## 2010-07-15 LAB — ABO/RH: ABO/RH(D): O POS

## 2010-07-15 LAB — SAVE SMEAR

## 2010-07-15 LAB — TSH: TSH: 2.034 u[IU]/mL (ref 0.350–4.500)

## 2010-07-15 LAB — PHOSPHORUS
Phosphorus: 2.8 mg/dL (ref 2.3–4.6)
Phosphorus: 2.9 mg/dL (ref 2.3–4.6)

## 2010-07-15 LAB — PROTEIN C, TOTAL: Protein C, Total: 74 % (ref 70–140)

## 2010-07-15 LAB — CARDIOLIPIN ANTIBODIES, IGG, IGM, IGA
Anticardiolipin IgA: <7 [APL'U] — ABNORMAL LOW (ref <13)
Anticardiolipin IgG: <7 [GPL'U] — ABNORMAL LOW (ref <11)
Anticardiolipin IgM: <7 [MPL'U] — ABNORMAL LOW (ref <10)

## 2010-07-15 LAB — FACTOR 5 LEIDEN

## 2010-07-15 LAB — PREALBUMIN
Prealbumin: 16.8 mg/dL — ABNORMAL LOW (ref 18.0–45.0)
Prealbumin: 21.9 mg/dL (ref 18.0–45.0)

## 2010-07-15 LAB — HOMOCYSTEINE: Homocysteine: 4.5 umol/L (ref 4.0–15.4)

## 2010-07-15 LAB — PROTEIN S ACTIVITY: Protein S Activity: 58 % — ABNORMAL LOW (ref 69–129)

## 2010-07-16 LAB — CBC
HCT: 40.3 % (ref 36.0–46.0)
HCT: 39.6 % (ref 36.0–46.0)
HCT: 44.2 % (ref 36.0–46.0)
Hemoglobin: 13.4 g/dL (ref 12.0–15.0)
Hemoglobin: 13.6 g/dL (ref 12.0–15.0)
Hemoglobin: 15.1 g/dL — ABNORMAL HIGH (ref 12.0–15.0)
MCHC: 33.2 g/dL (ref 30.0–36.0)
MCHC: 34.1 g/dL (ref 30.0–36.0)
MCHC: 34.4 g/dL (ref 30.0–36.0)
MCV: 84.4 fL (ref 78.0–100.0)
MCV: 83.7 fL (ref 78.0–100.0)
MCV: 83.8 fL (ref 78.0–100.0)
Platelets: 513 10*3/uL — ABNORMAL HIGH (ref 150–400)
Platelets: 488 10*3/uL — ABNORMAL HIGH (ref 150–400)
Platelets: 500 10*3/uL — ABNORMAL HIGH (ref 150–400)
RBC: 4.78 MIL/uL (ref 3.87–5.11)
RBC: 4.73 MIL/uL (ref 3.87–5.11)
RBC: 5.28 MIL/uL — ABNORMAL HIGH (ref 3.87–5.11)
RDW: 14.7 % (ref 11.5–15.5)
RDW: 14.8 % (ref 11.5–15.5)
RDW: 15 % (ref 11.5–15.5)
WBC: 9.5 10*3/uL (ref 4.0–10.5)
WBC: 12.5 10*3/uL — ABNORMAL HIGH (ref 4.0–10.5)
WBC: 9.1 10*3/uL (ref 4.0–10.5)

## 2010-07-16 LAB — DIFFERENTIAL
Basophils Absolute: 0 10*3/uL (ref 0.0–0.1)
Basophils Absolute: 0 10*3/uL (ref 0.0–0.1)
Basophils Relative: 1 % (ref 0–1)
Basophils Relative: 1 % (ref 0–1)
Eosinophils Absolute: 0.2 10*3/uL (ref 0.0–0.7)
Eosinophils Absolute: 0.2 10*3/uL (ref 0.0–0.7)
Eosinophils Relative: 2 % (ref 0–5)
Eosinophils Relative: 3 % (ref 0–5)
Lymphocytes Relative: 19 % (ref 12–46)
Lymphocytes Relative: 20 % (ref 12–46)
Lymphs Abs: 1.8 10*3/uL (ref 0.7–4.0)
Lymphs Abs: 1.8 10*3/uL (ref 0.7–4.0)
Monocytes Absolute: 0.5 10*3/uL (ref 0.1–1.0)
Monocytes Absolute: 0.5 10*3/uL (ref 0.1–1.0)
Monocytes Relative: 5 % (ref 3–12)
Monocytes Relative: 6 % (ref 3–12)
Neutro Abs: 7 10*3/uL (ref 1.7–7.7)
Neutro Abs: 6.5 10*3/uL (ref 1.7–7.7)
Neutrophils Relative %: 74 % (ref 43–77)
Neutrophils Relative %: 71 % (ref 43–77)

## 2010-07-16 LAB — URINALYSIS, ROUTINE W REFLEX MICROSCOPIC
Bilirubin Urine: NEGATIVE
Glucose, UA: NEGATIVE mg/dL
Hgb urine dipstick: NEGATIVE
Ketones, ur: NEGATIVE mg/dL
Nitrite: NEGATIVE
Protein, ur: NEGATIVE mg/dL
Specific Gravity, Urine: 1.003 — ABNORMAL LOW (ref 1.005–1.030)
Urobilinogen, UA: 0.2 mg/dL (ref 0.0–1.0)
pH: 7 (ref 5.0–8.0)

## 2010-07-16 LAB — COMPREHENSIVE METABOLIC PANEL
ALT: 15 U/L (ref 0–35)
AST: 15 U/L (ref 0–37)
Albumin: 3.3 g/dL — ABNORMAL LOW (ref 3.5–5.2)
Alkaline Phosphatase: 48 U/L (ref 39–117)
BUN: 11 mg/dL (ref 6–23)
CO2: 26 mEq/L (ref 19–32)
Calcium: 9.7 mg/dL (ref 8.4–10.5)
Chloride: 105 mEq/L (ref 96–112)
Creatinine, Ser: 0.88 mg/dL (ref 0.4–1.2)
GFR calc Af Amer: >60 mL/min (ref >60)
GFR calc non Af Amer: >60 mL/min (ref >60)
Glucose, Bld: 119 mg/dL — ABNORMAL HIGH (ref 70–99)
Potassium: 3.8 mEq/L (ref 3.5–5.1)
Sodium: 140 mEq/L (ref 135–145)
Total Bilirubin: 0.7 mg/dL (ref 0.3–1.2)
Total Protein: 6.7 g/dL (ref 6.0–8.3)

## 2010-07-16 LAB — BASIC METABOLIC PANEL
BUN: 17 mg/dL (ref 6–23)
CO2: 28 mEq/L (ref 19–32)
Calcium: 9.9 mg/dL (ref 8.4–10.5)
Chloride: 100 mEq/L (ref 96–112)
Creatinine, Ser: 0.87 mg/dL (ref 0.4–1.2)
GFR calc Af Amer: >60 mL/min (ref >60)
GFR calc non Af Amer: >60 mL/min (ref >60)
Glucose, Bld: 83 mg/dL (ref 70–99)
Potassium: 4.3 mEq/L (ref 3.5–5.1)
Sodium: 138 mEq/L (ref 135–145)

## 2010-07-16 LAB — LIPASE, BLOOD: Lipase: 27 U/L (ref 11–59)

## 2010-07-23 ENCOUNTER — Encounter (HOSPITAL_BASED_OUTPATIENT_CLINIC_OR_DEPARTMENT_OTHER): Payer: Managed Care, Other (non HMO) | Admitting: Oncology

## 2010-07-23 ENCOUNTER — Other Ambulatory Visit: Payer: Self-pay | Admitting: Oncology

## 2010-07-23 DIAGNOSIS — D6859 Other primary thrombophilia: Secondary | ICD-10-CM

## 2010-07-23 DIAGNOSIS — Z7901 Long term (current) use of anticoagulants: Secondary | ICD-10-CM

## 2010-07-23 DIAGNOSIS — C50419 Malignant neoplasm of upper-outer quadrant of unspecified female breast: Secondary | ICD-10-CM

## 2010-07-23 DIAGNOSIS — D47Z9 Other specified neoplasms of uncertain behavior of lymphoid, hematopoietic and related tissue: Secondary | ICD-10-CM

## 2010-07-23 LAB — CBC & DIFF AND RETIC
BASO%: 0.5 % (ref 0.0–2.0)
Basophils Absolute: 0 10*3/uL (ref 0.0–0.1)
EOS%: 2.2 % (ref 0.0–7.0)
Eosinophils Absolute: 0.2 10*3/uL (ref 0.0–0.5)
HCT: 35.6 % (ref 34.8–46.6)
HGB: 11.2 g/dL — ABNORMAL LOW (ref 11.6–15.9)
Immature Retic Fract: 6.4 % (ref 0.00–10.70)
LYMPH%: 19.1 % (ref 14.0–49.7)
MCH: 22.7 pg — ABNORMAL LOW (ref 25.1–34.0)
MCHC: 31.5 g/dL (ref 31.5–36.0)
MCV: 72.1 fL — ABNORMAL LOW (ref 79.5–101.0)
MONO#: 0.4 10*3/uL (ref 0.1–0.9)
MONO%: 5.3 % (ref 0.0–14.0)
NEUT#: 5.8 10*3/uL (ref 1.5–6.5)
NEUT%: 72.9 % (ref 38.4–76.8)
Platelets: 448 10*3/uL — ABNORMAL HIGH (ref 145–400)
RBC: 4.94 10*6/uL (ref 3.70–5.45)
RDW: 17.5 % — ABNORMAL HIGH (ref 11.2–14.5)
Retic %: 1.08 % (ref 0.50–1.50)
Retic Ct Abs: 53.35 10*3/uL (ref 18.30–72.70)
WBC: 7.9 10*3/uL (ref 3.9–10.3)
lymph#: 1.5 10*3/uL (ref 0.9–3.3)
nRBC: 0 % (ref 0–0)

## 2010-07-23 LAB — PROTIME-INR
INR: 2.6 (ref 2.00–3.50)
Protime: 31.2 Seconds — ABNORMAL HIGH (ref 10.6–13.4)

## 2010-08-13 NOTE — H&P (Signed)
Gwendolyn Williams, Gwendolyn Williams                ACCOUNT NO.:  [id number]   MEDICAL RECORD NO.:  [id number]          PATIENT TYPE:  INP   LOCATION:  1530                         FACILITY:  [facility]   PHYSICIAN:  Juanetta Gosling, MDDATE OF BIRTH:  1940-10-24   DATE OF ADMISSION:  08/27/2008  DATE OF DISCHARGE:  08/28/2008                              HISTORY & PHYSICAL   CHIEF COMPLAINT:  Colostomy status post left colectomy.   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  She has done well since then.  She has since undergone  cervical spine surgery by Dr. Venetia Maxon which she recovered well from.  She  has also been diagnosed with a myeloproliferative disorder by Dr. Riley Churches which was likely the source of her ischemia given that she  had been off of her aspirin.  She is now on chronic Coumadin for which  she is going to be on lifetime.  She underwent a barium enema  preoperatively which appeared normal as well as undergoing a colonoscopy  by Dr. Dorena Cookey which was also normal.  She then approximately 4  months after her initial surgery returned to discuss a colostomy  takedown with no real complaints except for the fact that she has had an  enlarging peristomal hernia over that time frame as well.   PAST MEDICAL HISTORY:  1. Myeloproliferative disorder.  2. Chronic irritable bowel syndrome.  3. History of diverticulosis.  4. Hypertension.  5. Hyperlipidemia.  6. 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   MEDICATIONS:  1. Coumadin 5 mg daily.  2. Singulair 10 mg q.h.s.  3. Pravachol 40 mg q.h.s.  4. Folic acid 1 mg daily.  5. Maxzide 37.5/25 mg daily.  6. Nexium 40 mg daily.  7. Aspirin 81 mg daily.   ALLERGIES:  1. SULFA.  2. PENICILLIN.   FAMILY HISTORY:  Negative.   SOCIAL HISTORY:  She is an Tourist information centre manager.  She is a  nonsmoker, occasional alcohol.   She lives at home with her husband who  is a Teacher, early years/pre.   PHYSICAL EXAMINATION:  She was noted to be afebrile.  GENERAL:  She is a well-appearing female in no distress.  NECK:  Supple without adenopathy.  HEART:  Regular rate and rhythm.  LUNGS:  Clear bilaterally.  ABDOMEN:  Soft, nontender.  She has a pink stoma with some mild prolapse  and a reducible peristomal hernia.  EXTREMITIES:  Show no edema.   ASSESSMENT:  A colostomy status post ischemic colitis and left colectomy  with a peristomal hernia.   PLAN:  We discussed open takedown of her colostomy as well as her  peristomal hernia likely with biologic mesh.  I did discuss that I might  use a human or animal product on her peristomal hernia, and she was  agreeable to this.  Today we did discuss the risks to be but not limited  to bleeding, infection, anastomotic leakage, reoperation, DVT, PE,  pneumonia, myocardial infarction, arrhythmias.  She understands all of  these risks, and we will proceed through a Lovenox window that is being  managed by Dr. Riley Churches.      Juanetta Gosling, MD  Electronically Signed     MCW/MEDQ  D:  08/31/2008  T:  08/31/2008  Job:  [id number]

## 2010-08-13 NOTE — Op Note (Signed)
NAMESPARROW, SIRACUSA                ACCOUNT NO.:  [id number]   MEDICAL RECORD NO.:  [id number]          PATIENT TYPE:  INP   LOCATION:  1401                         FACILITY:  [facility]   PHYSICIAN:  Juanetta Gosling, MDDATE OF BIRTH:  Feb 17, 1941   DATE OF PROCEDURE:  08/14/2008  DATE OF DISCHARGE:                               OPERATIVE REPORT   PREOPERATIVE DIAGNOSES:  1. 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  2. Peristomal hernia.   POSTOPERATIVE DIAGNOSES:  1. 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  2. Peristomal hernia.   PROCEDURE:  1. Exploratory laparotomy with lysis of adhesions times 30 minutes.  2. Colostomy takedown with a side-to-side colocolonic stapled      anastomosis.  3. Peristomal hernia repair primary was suture buttressed with 6 x 12-      cm intraperitoneal Flex HD.   SURGEON:  Troy Sine. Dwain Sarna, M.D.   ASSISTANT:  Anselm Pancoast. Zachery Dakins, M.D.   ANESTHESIA:  General.   SPECIMENS:  None.   DRAINS:  None.   ESTIMATED BLOOD LOSS:  100 mL.   COMPLICATIONS:  None.   SUPERVISING ANESTHESIOLOGIST:  Quentin Cornwall. Inocente Salles, M.D.   DISPOSITION:  To recovery room in stable condition.   INDICATIONS:  [name] is a 70 year old female who on April 11, 2008  underwent a left colectomy for ischemic colitis.  She had a colostomy at  that time as well.  Since then she has also undergone cervical spine  surgery with Dr. Venetia Maxon, which she has recovered well from.  She has also  been diagnosed with a myeloproliferative disorder for which she is on  chronic Coumadin with Dr. Cephas Darby.  She returned in April 2010  to discuss a colostomy takedown.  She also underwent a colonoscopy by  Dr. Dorena Cookey that showed a few scattered diverticula, but otherwise  normal colonoscopy proximal to her stoma.  She underwent a barium enema  as well that was normal-appearing, with no abnormality of the rectum,  sigmoid colon, or mid to  distal descending colon.  She and I then  discussed a colostomy takedown with repair of a peristomal hernia that  she had as well at the same time.  In discussion with Dr. Cyndie Chime,  this will need to be done through a Lovenox or heparin window, and we  have everything planned to do that in the correct fashion.  She and I as  well as her family had a long talk about the risks and benefits of the  operation and we will proceed.   PROCEDURE:  After informed consent was obtained, the patient first  administered her bowel prep the day prior to surgery at home.  This was  off of her Coumadin and then took a weight-based dose of Lovenox at 8  a.m. on Sunday morning.  She then presented to the hospital on Monday  morning.  Her PT/INR was normal as well as her potassium, which had been  low previously.  She was administered 1 g of intravenous Invanz and also  had sequential compression devices placed on  the lower extremities.  She  was then placed under general endotracheal anesthesia without  complication.  A Foley catheter was then placed.  She was placed in low  lithotomy and appropriately padded, just in case this was going to be  necessary.  Her colostomy was sewn shut with a 2-0 silk suture.  She was  then prepped and draped in the standard sterile surgical fashion.  Surgical time-out was then performed.   Our prior scar was then excised.  Dissection was carried down to the  level of the fascia.  Her old suture was then also removed.  I entered  her abdomen inferiorly in a virgin area of her abdomen without  difficulty.  I then proceeded to open the rest of her midline, lysing  adhesions from the omentum along the way.  There were some omental  adhesions to the right upper quadrant which were lysed sharply.  There  were multiple adhesions that took me about 30 minutes to release these  in her left lower quadrant including her colostomy.  Once I had her  colon free, we then made an  incision around her colostomy externally,  dissected down to the level of the colon and then internalized her  colon.  She did have about a 3 x 2-cm defect when this was complete.  When the colon was internalized, there were multiple other adhesions  that were lysed in between the bowel, and then following this we  identified the descending colon.  I had tacked this to the sidewall and  this was actually up in the left upper quadrant at that point.  This was  all freed.  The duodenum was also scarred to the colon.  This was also  freed.  All of these adhesions were lysed.  We then removed about 2 cm  of each end of the colon.  These were then brought together and 3-0 silk  was placed at the apex.  The colon was then entered and a 75 GIA stapler  was then inserted and fired.  This was open and hemostatic upon  completion.  A TA stapler was then used to close the common enterotomy.  A 3-0 Vicryl suture was then used to close the mesenteric defect.  This  again appeared open and the staple lines were hemostatic.  This appeared  also to lie in good position and the mesenteric defect was appropriately  repaired.  Following this, we then ran the small bowel as well as the  large bowel, looking for any enterotomies, and this was all negative.  Irrigation was then performed.  There were some multiple areas of  bleeding in her retroperitoneum, likely from just the lysis of adhesions  and her Lovenox that she has been on later.  This was packed with a  sponge.  The hernia defect was then closed with #1 Prolene sutures  externally.  This came together without any tension.  I did place a 6 x  12-cm piece of Flex HD intraperitoneally and used the Endoclose device  to secure this into position with transfascial sutures.  I also used  several other #1 Prolene sutures to secure this intraperitoneally as  well.  This covered her defect adequately.  Following this the sponge  was then removed.  There was 1  area of hemorrhage in her mesentery which  was controlled with 3-0 GI silk suture.  Following this, everything  appeared hemostatic.  Irrigation was then performed.  I closed her wound  with a #1 looped PDS closed.  I closed her colostomy  through the dermis of her colostomy with a 4-0 Vicryl suture.  Staples  were placed in these wounds.  Sterile dressings were then placed.  She  tolerated this well, was transferred to the recovery room in stable  condition.      Juanetta Gosling, MD  Electronically Signed     MCW/MEDQ  D:  08/14/2008  T:  08/14/2008  Job:  [id number]   cc:   Genene Churn. Cyndie Chime, M.D.  Fax: [phone]   Everardo All. Madilyn Fireman, M.D.  Fax: [phone]

## 2010-08-13 NOTE — Op Note (Signed)
Gwendolyn Williams, Gwendolyn Williams                ACCOUNT NO.:  [id number]   MEDICAL RECORD NO.:  [id number]          PATIENT TYPE:  INP   LOCATION:  2926                         FACILITY:  [facility]   PHYSICIAN:  John C. Madilyn Fireman, M.D.    DATE OF BIRTH:  06-13-1940   DATE OF PROCEDURE:  DATE OF DISCHARGE:                               OPERATIVE REPORT   INDICATION FOR PROCEDURE:  Inflammatory process on serial CT scans  involving the left colon to splenic flexure area, etiology not clear and  failing to respond clinically to 3 days of broad-spectrum antibiotics  with persistent pain, tenderness, and leukocytosis.   DESCRIPTION OF PROCEDURE:  The patient was placed in the left lateral  decubitus position and placed on the pulse monitor with continuous low-  flow oxygen delivered by nasal cannula.  She was sedated with 50 mcg IV  fentanyl and 5 mg IV Versed.  The Olympus video colonoscope was inserted  into the rectum and advanced ultimately to about 70-80 cm.  The colon  was somewhat tortuous which was known from a previous procedure.  The  distal 40 cm or so appeared essentially normal and was well prepped and  in particular, I did not see any definite diverticula.  From about 40-55  cm, there was more stool that impeded the view somewhat.  At about 60  cm, there was a circumferential inflamed area narrowing the lumen  slightly.  Beyond and proximal to that, there was an abrupt appearance  in the lumen with dilatation, flattening of haustral folds and a general  dusky snake skin appearance without any granularity or visible  submucosal hemorrhages.  This appeared strikingly abnormal, but in a  nonspecific way i.e., the colon was not dark blue and some submucosal  vessels could not be seen.  However, it appeared quite abnormal to me  and I suspected was indicative of some sort of ischemic process.  Fortunately, I was able to call for a surgeon, Dr. Dwain Sarna for his  assessment of the endoscopic  appearance and he agreed that it was quite  abnormal.  I carefully explored the segment for about 10 cm, but the  prep was particularly poor in this area and there was a lot of solid  stool and I could not maintain good enough visualization and I was  afraid of proceeding further due to causing damage to possibly  devitalized or severely ischemic bowel.  Both Dr. Dwain Sarna and I agreed  that it would probably not be helpful in relative the risk involved to  biopsy this area.  The scope was withdrawn back to the circumferential  inflamed erythematous pink mucosa just beyond the severely abnormal  part, and [place] ink  was injected in two sites just below it.  The scope  was then withdrawn and carefully inspected for any further inflammatory  changes and none were seen.  She tolerated the procedure well and there  were no immediate complications.   IMPRESSION:  Severely abnormal appearing colon in the descending colon  then to the splenic flexure area, I suspect ischemic.   PLAN:  Surgical consult, broaden antibiotic coverage, and clinical  judgment regarding possible surgical exploration and resection per  Surgery's judgment.           ______________________________  Everardo All. Madilyn Fireman, M.D.     JCH/MEDQ  D:  04/10/2008  T:  04/11/2008  Job:  [id number]

## 2010-08-13 NOTE — Consult Note (Signed)
Gwendolyn Williams, Gwendolyn Williams                ACCOUNT NO.:  [id number]   MEDICAL RECORD NO.:  [id number]           PATIENT TYPE:   LOCATION:                                 FACILITY:   PHYSICIAN:  Genene Churn. [place], M.D.DATE OF BIRTH:  11/29/1940   DATE OF CONSULTATION:  04/18/2008  DATE OF DISCHARGE:                                 CONSULTATION   This is a hematology consultation requested to evaluate this patient for  abnormal laboratory results in the setting of an episode of acute  ischemic colitis.   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  She has  had chronic upper GI symptoms for many years with epigastric pain and  reflux.  She is on chronic PPI treatment with Nexium.  She has known  diverticulosis and a tortuous colon on a colonoscopy done in 2004.  She  has had no clinical episodes of diverticulitis in the past.   She had planned elective cervical disk surgery on April 07, 2008.  On  her way to the hospital on the day of the procedure, she developed acute  abdominal pain.  Surgery was cancelled and she was sent to the emergency  department for evaluation.  The abdomen was diffusely tender.  She was  afebrile at 98.5.  Regular x-rays were normal except for significant  stool throughout the colon.  A CT scan of the abdomen and pelvis was  done which showed some wall thickening and surrounding mesenteric  stranding in the descending colon.  Lab profile showed leukocytosis with  white blood count 19,800.  She was admitted for further evaluation.   A gastroenterology consultation was obtained.  She underwent colonoscopy  by Dr. Dorena Cookey on April 10, 2008.  Findings were a grossly abnormal  left colon which appeared to be ischemic.  General Surgery consultation  was obtained.  She was taken to Exploratory Surgery on April 11, 2008.  Operative findings were a necrotic left colon.  No obvious major  vascular occlusion.  No obvious torsion.  No  intussusception.   The pathologic specimen showed segmental ischemia with associated  ulceration and inflammation.  Small vessel thrombi were noted in  mesenteric adipose tissue, but no major vessel thrombi noted.   Past medical history includes mild hypertension, elevated cholesterol,  and degenerative disease of the spine.  Chronic epigastric pain.  GERD.  No other major surgical procedures in the past.  Prior tubal ligation.  No documented ulcers.  No known inflammatory arthritis condition.  No  prior history of diabetes, MI, renal disease, hepatitis, yellow  jaundice, kidney stones, thyroid disease, seizure, stroke, blood clots.   Prehospital medications included:  1. Prednisone 60 mg daily, started about 10 days prior to her planned      cervical spine surgery.  2. Nexium 40 mg daily.  3. Dyazide.  4. Zocor 40 mg daily.  5. Singulair 1 puff at bedtime.  6. Fosamax weekly.  7. Glycosamine p.r.n.  8. Aspirin 81 mg daily.  9. Multivitamins daily.  10.Chromagen.  11.Iron daily.   FAMILY HISTORY:  Both her  parents are deceased.  Her mother had breast  cancer at age 42, subsequently died at age 30 with lung cancer, not  clear if this was metastatic from the original breast cancer.  Father  died in his 54s.  She has 1 sister who is healthy.  No family history of  any bleeding or clotting disorders or collagen vascular disease.   FAMILY HISTORY:  She is originally from [place], married with 2  children, a son and a daughter, who are both healthy.  She is a  nonsmoker.  No alcohol.  She is an Tourist information centre manager at [employer].   REVIEW OF SYSTEMS:  She recently had some thinning of her hair.  Her  dermatologist checked a blood count and told her she was anemic and  started her on iron.  She had no constitutional symptoms prior to this  admission and was active.  No weight loss that was intentional from an  exercise program at [employer].  No small joint arthritis or  arthralgias.   PHYSICAL EXAMINATION:  GENERAL:  Well-nourished Caucasian woman.  VITAL SIGNS:  Current blood pressure 130/77, pulse 61 and regular,  respirations 18, temperature 98, weight 50 kg.  HEENT:  Head and neck are normal.  Pharynx, no erythema, exudate, or  mass.  NECK:  Supple.  No thyromegaly or thyroid mass.  Carotids 2+.  No  bruits.  LUNGS:  Clear.  Resonant to percussion.  HEART:  Regular cardiac rhythm.  No murmur.  ABDOMEN:  Soft.  Mildly and diffusely tender.  She has a colostomy.  No  abdominal, carotid, or femoral bruits heard.  EXTREMITIES:  No edema.  No calf tenderness.  Dorsalis pedis pulse is 1+  on the right, 2+ on the left.  Posterior tibial pulse not palpable on  the right, 2+ on the left.  Radial pulses are 2+ symmetric.  Ulnar  pulses are absent, but symmetric.   Admission CBC with hemoglobin 16.5, hematocrit 50, MCV 84.5, white count  19,800 with 84 neutrophils, 11 lymphocytes, 4 monocytes, 1 eosinophil,  platelet count 560,000.  Initial chemistry profile with BUN 21,  creatinine 1.1.  Normal liver chemistries.  Total protein 6.6, albumin  3.8.  An urinalysis with no blood or protein.  Serum lipase 25.   Most recent CBC done on April 18, 2008, hemoglobin 12.6, hematocrit  38, white count 15,400, platelets 512,000.   A hypercoagulation profile done on April 14, 2008, shows antithrombin  III 121% of control (75-120), protein C total 74% of control (70-140),  protein C functional 111% (75-0.33), functional protein S 58 (69-129), a  lupus type anticoagulant was not detected, antibodies against of beta 2-  glycoprotein I not detected, plasma homocysteine normal at 4.5 (4-15.4),  neither the factor V Leiden mutation or prothrombin gene mutations are  detected.   Review of the peripheral blood film is pending.   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   No obvious major vascular compromise noted at time of laparotomy.   I wonder  whether there was transient torsion of her redundant colon to  explain this event?   Antithrombin III is a natural anticoagulant protein.  Elevation in this  case is likely due to an acute phase reactant and not pathologic.   Functional protein S is slightly decreased.  I doubt that this  represents a true protein S deficiency, but this should be repeated  again in 4-[redacted] weeks along with a free  protein S level.  I can do this as  an outpatient in my office.   She has persistent leukocytosis and thrombocytosis.  This is most likely  reactive and there may also be an element of iron deficiency to explain  the elevated platelet count.  This should also be repeated when she is  at a steady state to rule out a developing myeloproliferative disorder  which would also be in the differential for idiopathic thrombosis,  although once again there is no evidence for any major vascular  thrombosis at this time.   At this point, I do not feel that she needs to be on full-dose chronic  anticoagulation.  I agree with prophylactic dose anticoagulation while  she is hospitalized.   Thank you for this consultation.      Genene Churn. Cyndie Chime, M.D.  Electronically Signed     JMG/MEDQ  D:  04/18/2008  T:  04/19/2008  Job:  [id number]   cc:   Juanetta Gosling, MD  Everardo All. Madilyn Fireman, M.D.  Bryan Lemma. Manus Gunning, M.D.  Mick Sell, MD

## 2010-08-13 NOTE — Consult Note (Signed)
Gwendolyn Williams, GARVEY NO.:  [id number]   MEDICAL RECORD NO.:  [id number]          PATIENT TYPE:  INP   LOCATION:  2926                         FACILITY:  [facility]   PHYSICIAN:  Juanetta Gosling, MDDATE OF BIRTH:  [birth year]   DATE OF CONSULTATION:  DATE OF DISCHARGE:                                 CONSULTATION   CONSULTATION REQUESTED BY:  John C. Madilyn Fireman, MD   She is a 70 year old female who is scheduled for C-spine surgery on  Friday with Dr. Venetia Maxon.  She began to have severe abdominal pain acutely  prior to the surgery.  Surgery was cancelled, and she was sent to the  emergency room.  She had been on prednisone for her C-spine disease 60  mg daily, tapered to 40 mg as of April 08, 2008.  In the ER, her white  blood cell count was 20.  She had a  CT that showed possible  diverticulitis on Friday.  She was admitted, placed on Cipro and Flagyl  at that time.  She failed to progress on the Cipro and Flagyl.  Her  white count remained elevated.  She was rescanned today with an abnormal  splenic flexure with some wall thickening.  I was consulted by Dr. Madilyn Fireman  while he was doing a colonoscopy with the finding of ischemic mucosa of  her splenic flexure, which he did not want to traverse due to the risk  of perforation.  He did place ink at the distal margin of this.  I was  present for that.   PAST MEDICAL HISTORY:  Significant for:  1. Cervical disk disease.  2. GERD.  3. Increased cholesterol.   ALLERGIES:  Allergic to PENICILLIN and ZOFRAN.   PAST SURGICAL HISTORY:  No prior surgeries.   MEDICATIONS:  Includes recent prednisone, aspirin, Nexium, Dyazide,  simvastatin, Singulair, and Fosamax.   SOCIAL HISTORY:  She is a Runner, broadcasting/film/video.  She does drink occasional wine.  She  does not smoke tobacco.   PHYSICAL EXAMINATION:  VITAL SIGNS:  99.2, 95, 109/73, 20, 95 on 2 L.  GENERAL:  She is a female with some occasional abdominal pains.  CARDIOVASCULAR:  Regular rate  and rhythm.  LUNGS:  Clear are bilaterally.  ABDOMEN:  Mild distention.  Bowel sounds present.  Tender left upper  quadrant, and diffusely she is mildly tender.   LABORATORY DATA:  Her white blood cell count is 24.2, hematocrit is  35.3, platelets 325.  Her sodium is 128, potassium is 3.7, 98, 22, BUN  and creatinine are 8 and 0.94.  CT scan is as above.   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  She has no real risk factors for  embolic or thrombotic disease and no real episode of low-flow state, but  certainly it does appear she has a splenic flexure ischemia.  This has  not really progressed over the past several days.  She has not improved  with the standard treatment in the ischemic area.  I discussed a left  colectomy, possible ostomy.  I discussed doing this tonight or in the  morning.  She would like to have this done in the morning.  There is no  immediate indication to take her to the operating room right now, but I  certainly do think that she needs to go to the operating room in the  very near future for this, although I did discuss the chance of needing  something tonight if things certainly change.  I discussed the risks and  indications of the surgery extensively with her and her family.  We will  plan on proceeding first thing tomorrow morning.      Juanetta Gosling, MD  Electronically Signed     MCW/MEDQ  D:  04/10/2008  T:  04/11/2008  Job:  [id number]   cc:   Everardo All. Madilyn Fireman, M.D.  Danae Orleans. Venetia Maxon, M.D.

## 2010-08-13 NOTE — Consult Note (Signed)
NAMESHERREY, NORTH                ACCOUNT NO.:  [id number]   MEDICAL RECORD NO.:  [id number]          PATIENT TYPE:  INP   LOCATION:  1401                         FACILITY:  [facility]   PHYSICIAN:  Genene Churn. Granfortuna, M.D.DATE OF BIRTH:  [birth year]   DATE OF CONSULTATION:  08/15/2008  DATE OF DISCHARGE:                                 CONSULTATION   REASON FOR CONSULTATION:  This is a hematology consultation requested to  assist in perioperative anticoagulation management in this 70 year old  woman with a recently diagnosed myeloproliferative disorder.   HISTORY:  [name] is known to me from initial consultation done while  she was hospitalized back in January of this year.  She presented with  acute abdominal pain and hematochezia with findings of ischemic left  colitis requiring emergency left hemicolectomy and temporary colostomy.  Further evaluation of persistent leukocytosis and thrombocytosis reveals  that she likely has a myeloproliferative disorder.  Outpatient studies  done through my office included a JAK-2 analysis which returned positive  for this mutation.  This is a kinase gene which is over expressed in 50-  85% of myeloproliferative disorders including polycythemia vera and  essential thrombocythemia.  In absence of any other known acquired or  family risk factors for her initial acute ischemic event, I felt it was  highly likely that the event was related to this previously undiagnosed  blood disorder which is one of the few hematologic disorders that can  present with arterial thrombosis in unusual locations.  Screening  evaluations for additional acquired or congenital coagulopathies  returned negative.  Antithrombin level was normal at 221% of control (75-  120), protein C total normal at 74% (70-140), protein C functional  normal 111% (75-133, protein S total 91 (70-140), functional protein S  58 (81-191 which when repeated through my office returned mid-range  normal.  A lupus type anticoagulant was not detected.  Antibodies  against the anti-cardiolipin and beta-2 glycoprotein 1 were  undetectable.  A plasma homocysteine was normal at 4.5 (4-15.4).  The  prothrombin gene and factor V Leiden gene mutations were not detected.  I elected to start her on full-dose Coumadin anticoagulation plus low-  dose aspirin.   She is admitted at this time for elective closure of her colostomy.  Of  note over time, her white blood count has normalized and platelet count  has come down to the high normal range running in the 400,000-450,000  range.  A preop CBC done on Aug 08, 2008 with hemoglobin 14.6,  hematocrit 42.5, MCV 84, white count 71% neutrophils, platelets 449,000.   Coumadin was stopped on Aug 11, 2008 and substituted with therapeutic  doses of Lovenox.  Last dose of Lovenox given 24 hours prior to planned  surgery on Aug 13, 2008.   She underwent uncomplicated surgery yesterday on Aug 14, 2008.  Although  estimated blood loss was only 100 mL, the surgeon noted oozing from  multiple surfaces at time of lysis of adhesions.  Hemoglobin fell from  preop value of 14.6 to today's value of 10.0.   PAST MEDICAL HISTORY:  1. Remarkable for chronic irritable bowel syndrome.  2. Known diverticulosis.  3. Mild hypertension.  4. Hyperlipidemia.  5. Degenerative arthritis of the spine with recent cervical disk      surgery done on May 23, 2008 by Dr. Venetia Maxon with a decompression      laminectomy and fusion at C6-C7 and C7-T1.   ADMISSION MEDICATIONS:  1. Coumadin 5 mg daily.  2. Singulair 10 mg h.s.  3. Pravastatin 40 mg h.s.  4. Folic acid 1 mg daily.  5. Maxzide 37.5/25 mg one daily.  6. Nexium 40 mg daily.  7. Aspirin 81 mg daily.   ALLERGIES:  1. SULFA.  2. PENICILLIN.   FAMILY HISTORY:  Unremarkable for any blood disorders.   SOCIAL HISTORY:  She is an Tourist information centre manager.  Nonsmoker.  One  healthy son.  Husband is a  Teacher, early years/pre.   PHYSICAL EXAMINATION:  VITAL SIGNS:  Postop 24 hours, blood pressure has  fallen from a preop value of 125/85 to 96/61, pulse is 94 and regular,  respirations 16, temperature 98.2, oxygen saturation 98% on room air.  HEAD/NECK:  Normal.  LUNGS:  Clear.  HEART:  Regular cardiac rhythm.  No murmur.  ABDOMEN:  Soft, tender, midline surgical incision.  current wound  dressing is dry with no extension of initial bleeding which is visible  under the dressing.  EXTREMITIES:  No edema.  No calf tenderness.  She has pneumatic cuffs in  place.   IMPRESSION:  Myeloproliferative disorder, likely resulting in acute  ischemic colitis in January of this year.  She is now 24 hours post-  closure of colostomy, lysis of adhesions and repair of  peristomal  hernia.   She is overall stable.  Blood pressure is running low and she was just  given a fluid challenge.  She appears to have lost more blood than what  was obvious at time of surgery with fall in hemoglobin from 14.6 down to  10 gm.  Platelet count is stable.   RECOMMENDATIONS:  I agree with using just low-dose prophylactic heparin  5000 units q.8 h. until she is hemodynamically stable and there are no  further signs of ongoing bleeding.  We will monitor her hematocrit over  the course of the next 24-48 hours.  When hemoglobin is stable, we can  resume her Coumadin.  Resume low-dose aspirin when she is able to take  p.o.   Of note, 5 mg daily of Coumadin as an outpatient lead to  supratherapeutic levels, so her dose will need to be less than that.  I  would start at 4 mg daily and then adjust based on PT/INR assays.   Thank you for this consultation.      Genene Churn. Cyndie Chime, M.D.  Electronically Signed     JMG/MEDQ  D:  08/15/2008  T:  08/15/2008  Job:  [id number]   cc:   Everardo All. Madilyn Fireman, M.D.  Fax: [phone]   Juanetta Gosling, MD  [address]  [place] [address]   Danae Orleans. Venetia Maxon, M.D.  Fax:  [phone]   Tiney Rouge, MD   Mick Sell, MD

## 2010-08-13 NOTE — Discharge Summary (Signed)
Gwendolyn Williams, Gwendolyn Williams                ACCOUNT NO.:  [id number]   MEDICAL RECORD NO.:  [id number]          PATIENT TYPE:  INP   LOCATION:  1401                         FACILITY:  [facility]   PHYSICIAN:  Juanetta Gosling, MDDATE OF BIRTH:  1940/04/02   DATE OF ADMISSION:  08/14/2008  DATE OF DISCHARGE:  08/22/2008                               DISCHARGE SUMMARY   ADMISSION DIAGNOSES:  1. 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  2. Myeloproliferative disorder.  3. Chronic irritable bowel syndrome.  4. Hypertension.  5. Hyperlipidemia.  6. Degenerative arthritis of the cervical spine.   ADMISSION MEDICATIONS:  1. Coumadin 5 mg daily.  2. Singulair 10 mg q.h.s.  3. Pravachol 40 mg q.h.s.  4. Folic acid 1 mg daily.  5. Maxzide 37.5/25 daily.  6. Nexium 40 daily.  7. Aspirin 81 mg daily.   DISCHARGE DIAGNOSES:  1. Status post exploratory laparotomy with lysis of adhesions,      colostomy takedown and peristomal hernia repair with biologic mesh.  2. Myeloproliferative disorder.  3. Chronic irritable bowel syndrome.  4. Hypertension.  5. Hyperlipidemia.  6. Degenerative arthritis of the cervical spine.   DISCHARGE MEDICATIONS:  1. Coumadin 4 mg daily.  2. Singulair 10 mg q.h.s.  3. Pravachol 40 mg q.h.s.  4. Folic acid 1 mg daily.  5. Maxzide 37.5/25 daily.  6. Nexium 40 daily.  7. Aspirin 81 mg daily.  8. Lovenox 80 mg daily.  9. Percocet 1-2 tablets as needed every 4 hours for pain.   HOSPITAL COURSE:  Gwendolyn Williams is a 70 year old female who underwent a  left colectomy and an end-descending colostomy for ischemic colitis  likely secondary to an undiagnosed myeloproliferative disorder while she  was off her aspirin awaiting C-spine surgery.  She has done well since  then.  She has had her C-spine repaired.  She is now on chronic aspirin  and Coumadin therapy.  Dr. Cephas Darby has recommended a Lovenox  window for her surgery.  Her Coumadin was taken  off.  She had a dose of  Lovenox 24 hours prior to beginning her surgery to cover her.  She  remained on her aspirin during the surgery.  She was prepped at home on  the morning of surgery.  PT/INR were acceptable for surgery.  She then  went to the operating room and underwent lysis of adhesions for 30  minutes, a colostomy takedown and a peristomal hernia repair that was  primary and buttressed with FlexHD.  Postoperatively, she did well.  I  held her anticoagulation due to some oozing postoperatively.  Her  hematocrit eventually drifted down to about 24.7.  We held on a  transfusion and as she returned her third-space fluids, her hematocrit  returned to the 27 range.  Upon discharge, her hemoglobin was 9.9.  She  began to have bowel function with some passage of flatus as well as some  small stools.  Her nausea resolved after a couple of days and her diet  was slowly advanced at that point.  Her aspirin was restarted on postop  day 3.  I restarted her Lovenox on postoperative day 5 and began her  Coumadin the following day.  She was discharged home on Aug 22, 2008  after passing a fair amount of flatus.  Her INR was 1.2 and her  hemoglobin was stable at 10.2.  Her platelets were 569,000.   FOLLOW UP:  1. Follow up was with Dr. Dwain Sarna at [facility] in 1      week.  2. Dr. Cephas Darby as he had directed her to continue to adjust      her Coumadin.   DIAGNOSTICS:  1. She had a CT angio of the chest to rule out a pulmonary embolus due      to some shortness of breath postoperatively that was negative.  2. She also had a chest x-ray which showed a small effusion and some      free air postoperatively.   DISCHARGE INSTRUCTIONS:  She was warned that if she developed a fever of  100.5, nausea, vomiting, inability to pass flatus or she was unable to  pass a stool over the next week as well as any other problems, to call  the [facility] at  [phone].      Juanetta Gosling, MD  Electronically Signed     MCW/MEDQ  D:  08/31/2008  T:  08/31/2008  Job:  [id number]

## 2010-08-13 NOTE — Discharge Summary (Signed)
NAMEHELINA, Gwendolyn Williams                ACCOUNT NO.:  [id number]   MEDICAL RECORD NO.:  [id number]          PATIENT TYPE:  INP   LOCATION:  5124                         FACILITY:  [facility]   PHYSICIAN:  Juanetta Gosling, MDDATE OF BIRTH:  08/06/1940   DATE OF ADMISSION:  04/07/2008  DATE OF DISCHARGE:  04/19/2008                               DISCHARGE SUMMARY   CONSULTATIONS:  1. Genene Churn. Cyndie Chime, MD  2. John C. Madilyn Fireman, MD   CHIEF COMPLAINT AND REASON FOR ADMISSION:  [name] is a 70 year old  female patient with history of spinal disk disease with intermittent  chronic abdominal pain, irritable bowel syndrome, and dyslipidemia who  was admitted to the hospital on April 08, 2008 for C-spine surgery for  cervical radiculopathy.  She developed acute onset of left lower  quadrant abdominal pain that became very severe while waiting in the  Short-Stay Unit.  This was very abrupt in nature, not typical of her  usual pain.  She says that 24 hours prior she had some crampiness after  eating breakfast with radiating to the back and apparently had been  having dry heaving while on the Short-Stay Unit.  She does have reported  history of constipation and had not been on a steroid taper 7 days ago.  Upon evaluation by the admitting physician, the patient's abdomen was  soft with bowel sounds present, very very tender to palpation in left  lower quadrant but without rebounding.  Her white count was 24,000,  hemoglobin 16.8, and platelets 560,000.  Potassium 3.1 and creatinine  1.15.  An acute CT scan was ordered that showed minimal wall thickening  in the descending colon with mesenteric stranding with stool throughout  the colon.  The patient was admitted by the medicine team of the  following diagnoses:  Left lower quadrant abdominal pain and associated  obstipation, rule out colitis versus worsening constipation, irritable  bowel versus other.   HOSPITAL COURSE:  The patient was  admitted, started on IV fluids, bowel  rest, IV Cipro and Flagyl for empiric coverage of possible diverticular  disease or colitis and was started on MiraLax and Colace to help with  bowel evacuation of obstipation seen on CT.  She was given Zofran for  nausea control and Dilaudid for pain with plans to monitor carefully and  if she did not improve, ask Gastroenterology to evaluate the patient.  Unfortunately, during the same day of admission, the patient developed  quite severe abdominal pain and an emergent GI consultation was  obtained.  She was seen by Dr. Dorena Cookey.  On exam again, she was  having mild tenderness in left abdomen and mid abdomen and was felt she  had some sort of inflammatory process involving the left colon, so plans  were continue home Cipro and Flagyl and if her white count did not drop  quickly, may need to repeat CT scan versus other evaluation.   On April 10, 2008, the patient underwent an extended sigmoidoscopy  that showed inflamed circumferential rigid about 60 cm of flattened  dusky very abnormal perimucosa.  There  was no granularity, friability,  or submucosal hemorrhage, but overall appearance consistent with focal  ischemic bowel.  Emergent consultation was obtained with Dr. Dwain Sarna  from Surgery.  He was also shown the actual endoscopic films and agreed  that there was a focal area of ischemia.  Based on these findings, there  was some consideration that the patient would not need surgical  intervention within the next 12-24 hours if her symptoms did not  improve.   By April 11, 2008, the patient's abdomen was still quite tender.  Her  vital signs were stable but her white count increased to 22,500.  Therefore, she was taken to the OR by Dr. Dwain Sarna on April 11, 2008  where she underwent exploratory laparotomy and left colectomy and end  colostomy for ischemic colitis.   In the postoperative period, the patient had expected postoperative   ileus, difficulty with pain management, and some difficulty with nausea  and vomiting.  Her ostomy remained stable with the stoma being pink  without any further evidence of evolving ischemic process.  Her white  count slowly decreased because of poor IV access and suspected prolonged  ileus.  PICC line was placed and parenteral nutrition was initiated.  The patient did have difficulty with pain management as previously  mentioned and this was better controlled with PCA Dilaudid.  Toradol was  tried with this that caused the patient increasing gastrointestinal  symptoms.  She has a history of GERD and has not tolerated NSAIDs in the  past.   The patient's pathology subsequently revealed a segmental ischemia with  ulceration and inflammation extending to the serosal surface.  The  mesentery showed inflammatory changes and blood vessel thrombi.  In  addition to routine postop care and slow advancement of diet once flatus  and bowel sounds returned, wound ostomy care nurse was consulted to  assist the patient with management of her new colostomy.   By postoperative day #4, the patient was started on a clear liquid diet  with slow advancement of her diet.  Because of the abnormal pathology  findings, Dr. Dwain Sarna opted to do a 2-D echocardiogram to rule out an  embolic source.  This showed no evidence of cardiac emboli.  A  coagulopathy panel was also ordered to make sure the patient did not  have an underlying coagulopathy.  Her antithrombin III was mildly  elevated at 221.  Protein C was normal and protein S was slightly low at  58.  These were all presumed to be more or less related to the acute  inflammatory change and thrombotic process from the ischemia but as  precaution a Hematology consult was obtained.  Dr. Cyndie Chime evaluated  the patient and agreed that the patient had idiopathic acute ischemia of  the left colon.  No obvious major vascular compromise.  He was  questioning  whether there was a transient torsion of the redundant  colon.  After reviewing her CT, he agreed again that the slight  abnormalities and the coagulopathy workup panels were normal reactive  processes to her acute ischemic event and did not feel she had a  developing mildly proliferative disorder.  He did not feel like she  needed full-dose anticoagulation at that time.  Please refer to his note  for additional details.   Otherwise by postop day #8, April 19, 2008, the patient was tolerating  a solid diet.  She felt relatively comfortable with ostomy care.  The  patient's family had also been  very proactive in the patient's care and  were at the patient side for most of the hospitalization.  The patient  did have some small openings in the abdominal wound which did require  packing which were clean.  The stoma remained pink and she was having  normal stool at time of discharge.  Her parenteral nutrition had been  discontinued and on date of discharge, her white count was 13,200,  hemoglobin 1.5, and platelets 428,000.  Creatinine was stable at 0.69.  Plain x-rays were done on date of discharge that showed no acute process  and she was deemed appropriate for discharge home per Dr. Dwain Sarna.   FINAL DISCHARGE DIAGNOSES:  1. Acute abdominal pain secondary to acute focal colonic ischemia,      idiopathic in nature.  2. Status post exploratory laparotomy with colectomy and colostomy.  3. Mildly abnormal coagulopathy pattern, acute phase reactant to acute      ischemic event.  4. Cervical spine disk disease with surgery pending.  5. Gastroesophageal reflux disease, stable.  6. Dyslipidemia.  7. Hypertension.   DISCHARGE MEDICATIONS:  1. The patient is to hold her normally ordered Dyazide due to volume      depletion and variable p.o. intake including fluids at the time of      discharge.  2. Nexium 40 mg daily.  3. Simvastatin 40 mg at bedtime.  4. Singulair at bedtime.  5.  Fosamax 35 mg every week.  6. CoQ10 100 mg daily.  7. Centrum Silver daily.  8. Chromagen FA daily.  9. Aleve 220 mg 2 tablets daily.  10.Colace 100 mg daily.  11.Aspirin 81 mg daily basis.  12.Hyoscyamine as needed.   NEW MEDICATIONS:  1. Percocet 1-2 tablets every 4-6 hours as needed for pain.  2. Ciprofloxacin as directed on bottle.  3. Flagyl as directed on bottle.  4. Norvasc 5 mg daily.   DIET:  Low-sodium, heart-healthy.   RETURN TO WORK:  Nonapplicable.   ACTIVITY:  Increase activity slowly.  May walk up steps.  May shower.  Remove wound packing first.  No lifting greater than 10 pounds for 5  weeks.  No driving for 2 weeks.   WOUND CARE:  Normal saline packing to abdominal wound b.i.d., Home  Health RN to assist.  Routine colostomy care, Home Health RN to assist.   FOLLOWUP APPOINTMENTS:  The patient has an appointment with Dr.  Dwain Sarna at his office on Tuesday, April 25, 2008.  The patient also  had been given instructions that she is to call to talk to Dr.  Doreen Salvage nurse Helmut Muster regarding the need for labs to be drawn prior  to appointment with Dr. Dwain Sarna.  Please note that Home Health Team is  [facility].      Allison L. Marya Landry, MD  Electronically Signed    ALE/MEDQ  D:  06/05/2008  T:  06/05/2008  Job:  [id number]   cc:   Everardo All. Madilyn Fireman, M.D.  Bryan Lemma. Manus Gunning, M.D.

## 2010-08-13 NOTE — Op Note (Signed)
NAMEYAHIRA, TIMBERMAN NO.:  [id number]   MEDICAL RECORD NO.:  [id number]          PATIENT TYPE:  INP   LOCATION:  2926                         FACILITY:  [facility]   PHYSICIAN:  Juanetta Gosling, MDDATE OF BIRTH:  [birth year]   DATE OF PROCEDURE:  04/11/2008  DATE OF DISCHARGE:                               OPERATIVE REPORT   PREOPERATIVE DIAGNOSIS:  Ischemic colitis.   POSTOPERATIVE DIAGNOSIS:  Ischemic colitis.   PROCEDURE PERFORMED:  1. Exploratory laparotomy with mobilization of splenic flexure.  2. Left colectomy.  3. End colostomy.   SURGEON:  Juanetta Gosling, MD   ASSISTANT:  Gabrielle Dare. Janee Morn, M.D.   ANESTHESIA:  General.   FINDINGS:  Necrotic left colon.   SPECIMENS:  Left colon to pathology.   ESTIMATED BLOOD LOSS:  120 mL.   COMPLICATIONS:  None.   DISPOSITION:  To recovery room in stable condition.   INDICATIONS:  Ms. Calandra is a 70 year old female consulted on by Dr.  Dorena Cookey for abdominal pain.  She was treated for presumed  diverticulitis then did not improve, underwent a repeat CT scan, which  showed  wall thickening in her splenic flexure.  On colonoscopy, her  mucosa appeared to be nonviable.  She continued to have pain as well as  an elevated white count.  I discussed with her laparotomy with a likely  left colectomy from what appears to be ischemic colitis.   PROCEDURE IN DETAIL:  After informed consent was obtained, the patient  was taken to the operating room.  She was on Primaxin on the floor and  had been given this.  Sequential compression devices were placed on her  lower extremities prior to induction.  She underwent a general  endotracheal anesthesia without complication.  A Foley catheter was then  placed.  Her abdomen was then prepped and draped in a standard sterile  surgical fashion.  Surgical time-out was then performed.   An upper midline incision to just below her umbilicus was then made.  There was no  free fluid present in the abdomen upon entering.  The colon  was noted to be very redundant.  The small bowel was then packed away  and the colon was mobilized.  It was very quickly noted that her left  colon from her splenic flexure down to the beginning of her sigmoid  colon was necrotic and dead.  There was no obvious perforation in this  region.  The left colon was then rolled medially with some difficulty.  There was no perforation that occurred during this portion of the  procedure.  The splenic flexure was then mobilized from the spleen and  the transverse colon separated from the stomach by dividing the  gastrocolic omentum.  Following this, there was a very clear demarcation  of the necrotic colon.  A site in the sigmoid colon was chosen and the  GI was used to divide this.  A site in the mid transverse colon was also  identified.  This was divided with a GI as well.  The LigaSure as well  as  silk ties for the vessels were then used to come across the entire  mesentery.  I did identify the ureter during this portion of procedure  and this was preserved.  There was a small amount of bleeding when I did  this from a small vein that was controlled with a 3-0 silk suture.  There was a lot of inflammation and still some necrotic tissue present  in the left gutter as much as it was possible, was removed.  Copious  irrigation was performed.  Her bowel was very dilated up to this point  and due to her albumin being 1.2 in the findings at her operation, I  along with Dr. Janee Morn, believed it to be the safest plan is to give  her a colostomy.  I was also concerned about her primary process  potentially being a vascular event and did not want to place an  anastomosis at this time.  She had a very long Hartmann, which extends  up near to her spleen.  I did tack this to the abdominal wall with a 3-0  Prolene above the level of her ostomy.  I then mobilized her colon  enough and planned to do  a colostomy in her left lower quadrant.  An  incision was then made overlying this.  Dissection carried out down to  the fascia.  Cruciate incision was then made, 2 fingers were inserted  into this.  The colon was then brought through with a Babcock clamp.  I  tacked the colon on this side with a 3-0 Vicryl.  Following this, the  abdomen was then irrigated further.  Hemostasis was observed.  I then  closed the abdomen with a #1 PDS running suture with approximately every  4 cm with a #1 Prolene interrupted suture.  I stapled this in a few  positions.  After this was completed, I then cleaned off some fat from  the end of the colostomy, opened this, controlled 2 areas of bleeding  and then matured the colostomy with a 4-0 Vicryl suture.  A bag was  placed.  Sterile dressings were placed.  She tolerated this well.  She  will be transferred to the recovery room in a stable condition.      Juanetta Gosling, MD  Electronically Signed     MCW/MEDQ  D:  04/11/2008  T:  04/12/2008  Job:  [id number]   cc:   Everardo All. Madilyn Fireman, M.D.  Danae Orleans. Venetia Maxon, M.D.

## 2010-08-13 NOTE — H&P (Signed)
NAMEVANILLA, HEATHERINGTON NO.:  [id number]   MEDICAL RECORD NO.:  [id number]          PATIENT TYPE:  INP   LOCATION:  6735                         FACILITY:  [facility]   PHYSICIAN:  Mick Sell, MD DATE OF BIRTH:  Sep 04, 1940   DATE OF ADMISSION:  04/08/2008  DATE OF DISCHARGE:                              HISTORY & PHYSICAL   REASON FOR ADMISSION:  Abdominal pain.   PRIMARY CARE PHYSICIAN:  Bryan Lemma. Manus Gunning, M.D. with [facility].   HISTORY OF PRESENT ILLNESS:  This is a 70 year old white female with  history of spinal and disk disease as well as intermittent chronic  abdominal pain, irritable bowel syndrome and hyperlipidemia, who was set  to undergo a C-spine surgery today for cervical radiculopathy when she  developed rather acute onset of left lower quadrant abdominal pain that  became severe while waiting in the short stay unit.  This seems to have  come out of the blue, although she does say that this morning prior to  admission, she had a little bit of crampy belly pain after eating  breakfast.  She says the pain radiates to her back.  She was also having  impressive dry heaves, but no emesis in the short stay unit.  She does  relate that she has been having constipation over the last week or so  with very small bowel movements.  There has been no blood in her stool.  She denies any fevers, chills or night sweats.  She has not had any  weight loss.  She was started on steroid taper approximately 7 days ago  for her neck pain and she believes this has been making her quite  constipated with increased straining.   PAST MEDICAL HISTORY:  1. C-spine disk disease.  2. Lumbar disk disease with surgery 3 years ago.  3. GERD.  4. Chronic abdominal pain with a prior colonoscopy and barium enema      showing a torturous colon.  She carries the diagnosis of irritable      bowel syndrome.  5. Hyperlipidemia.   ALLERGIES:  1. PENICILLIN.  2. SULFA.    MEDICATIONS:  1. Nexium 40 once a day.  2. Dyazide.  3. Simvastatin.  4. Singulair.  5. Fosamax.  6. Aspirin.  7. Hyoscine.  8. Oxycodone  9. Aleve.  10.Prednisone taper starting at 60 mg once a day, now down to 20 mg      once a day.   SOCIAL HISTORY:  The patient works as a Runner, broadcasting/film/video.  She lives with her  husband.  She denies any tobacco, alcohol or drugs.  She occasionally  does drink wine.   FAMILY HISTORY:  Noncontributory.   REVIEW OF SYSTEMS:  Eleven systems reviewed and negative except as per  HPI.   PHYSICAL EXAMINATION:  VITAL SIGNS:  Temperature 98.1, pulse 63, blood  pressure 155/87, respirations 16, satting 96% on room air.  GENERAL:  She is a pleasant white female in no acute distress except for  some mild abdominal discomfort.  HEENT:  Pupils are equal, round, reactive to light and  accommodation.  Extraocular movements are intact.  Sclerae are anicteric.  HEART:  Regular.  No murmurs, rubs or gallops.  LUNGS:  Clear to auscultation.  ABDOMEN:  Soft.  She has positive bowel sounds.  She is quite tender to  palpation in the left lower quadrant, but there is no rebound.  EXTREMITIES:  No clubbing, cyanosis or edema.  NEURO:  She is nonfocal.   LABORATORY DATA:  The patient had a white blood count of 24.0 with an  ANC of 20.0, hemoglobin 16.8, platelets 560, sodium 134, potassium 3.1,  chloride 98, bicarb 26, BUN 18, creatinine 1.15, glucose 103, lipase 25.  LFTs within normal limits.   DIAGNOSTICS:  CAT scan, she had a CT showing minimal wall thickening in  the descending colon with mesenteric stranding.  There was also stool  throughout her colon.   IMPRESSION:  This is a 70 year old white female with a history of  irritable bowel who was about to undergo neck surgery for cervical  radiculopathy when she developed left lower quadrant abdominal pain and  retching.  Of note, she has been on prednisone for the last week or so  and has become quite constipated  from it she relates.  CAT scan shows  some inflammation in the descending colon as well as stool in her colon.  She did have a high white count, but no fevers.  I believe the white  count is probably elevated secondary to the prednisone taper she is on.  Differential diagnosis for abdominal pain and CT findings include a  focal colitis, severe constipation, diverticulitis, malignancy or a new  diagnosis of new presentation of inflammatory bowel disease.   PLAN:  1. We will admit for IV fluids and cover her with antibiotics with IV      Cipro and Flagyl.  We have given her MiraLax and Colace to help      move her bowels to help rule out constipation as the etiology of      her pain.  We will do pain control and nausea control with Dilaudid      and Zofran.  When she has a bowel movement, we will send it for      stool cultures as well as C. diff, although these are unlikely to      be revealing.  If she has no improvement, I think she would benefit      from a GI consult and a scope in the next few days.  2. I will continue her outpatient blood pressure and lipid meds,      however, we will hold her prednisone for now.      Mick Sell, MD  Electronically Signed     DPF/MEDQ  D:  04/08/2008  T:  04/08/2008  Job:  [id number]

## 2010-08-13 NOTE — H&P (Signed)
NAMECHESNEY, SUARES NO.:  [id number]   MEDICAL RECORD NO.:  [id number]          PATIENT TYPE:  EMS   LOCATION:  ED                           [facility]ACILITY[facility]  [facility]   PHYSICIAN:  Juanetta Gosling, MDDATE OF BIRTH:  01-Feb-1941   DATE OF ADMISSION:  08/27/2008  DATE OF DISCHARGE:                              HISTORY & PHYSICAL   CHIEF COMPLAINT:  Unable to pass stool.   HISTORY OF PRESENT ILLNESS:  This is a 70 year old female who I know  from an emergent left colectomy in January for ischemic colitis  secondary to newly diagnosed myeloproliferative disorder.  She underwent  a colostomy takedown on Aug 14, 2008, after which she did well  afterwards.  She was passing gas and has had several small stools since  then.  She was discharged home about 5 days ago.  She has had some  nausea and some vomiting recently.  Denies any fevers and she is unable  to pass anything but a small stool.  She feels like she has a lot of  stool present and feels mildly distended.   PAST MEDICAL HISTORY:  Significant for:  1. Chronic constipation.  2. Hypertension.  3. Myeloproliferative disorder, now on chronic Coumadin and aspirin.  4. GERD.   PAST SURGERIES:  1. Left colectomy.  2. C-spine per Surgery.   SOCIAL HISTORY:  Drinks occasional wine.  She is a nonsmoker.  Works as  an Tourist information centre manager.   DRUG ALLERGIES:  1. SULFA.  2. PENICILLIN.   MEDICATIONS:  1. Coumadin 4 mg p.o. nightly.  2. Singulair 10 mg p.o. nightly.  3. Pravastatin 40 mg nightly.  4. Folate.  5. Maxzide 37.5/25 daily.  6. Nexium 40 mg daily.  7. Aspirin 81 mg daily.   REVIEW OF SYSTEMS:  Otherwise negative.   PHYSICAL EXAMINATION:  Shows her to be afebrile with stable vital signs.  GENERAL:  She is a well-appearing female in no distress currently.  NECK:  Supple.  HEART:  Regular rate and rhythm.  LUNGS:  Clear bilaterally.  ABDOMEN:  Mildly distended.  She has some drainage from the  upper  portion of her wound but no erythema present.  She does have some bowel  sounds.  EXTREMITIES:  Show no edema.   LABORATORY EXAMINATION:  Shows sodium 135, potassium 3.5, BUN 20,  creatinine 1.03, glucose 115.  PT is 26.8 INR is 2.3.  White blood cell  count is 19.  Her hematocrit is 37.3, platelets are 705.  She has an  acute abdominal series, shows no free air, stool throughout the colon,  including in the rectum, as well as a fair amount distally, and no small  bowel obstruction.   IMPRESSION:  Constipation status post colostomy takedown and superficial  site infection.   PLAN OF ACTION:  1. Open wound and pack this with no further drainage.  2. Admission for IV fluids due to some dehydration, as well as      attempts to clean her out with some enemas and oral laxatives.  We  discussed disimpaction and a rectal examination but she would not      like to pursue this at this time.  I do not think she has any evidence of an intra-abdominal abscess (her  wbc is chronically elevated from myeloproliferative disorder so is not a  good measure- she is afebrile) or an anastomotic problem due to her  passing flatus and her xray.  will attempt to get her to pass stool and  on a bowel regimen.  if this fails to improve her , then we will  consider further evaluation with a ct scan.      Juanetta Gosling, MD  Electronically Signed     MCW/MEDQ  D:  08/27/2008  T:  08/27/2008  Job:  [id number]

## 2010-08-13 NOTE — Op Note (Signed)
NAMEAISHANI, Gwendolyn                ACCOUNT NO.:  [id number]   MEDICAL RECORD NO.:  [id number]          PATIENT TYPE:  OIB   LOCATION:  3526                         FACILITY:  [facility]   PHYSICIAN:  Danae Orleans. Venetia Maxon, M.D.  DATE OF BIRTH:  1941/03/03   DATE OF PROCEDURE:  05/23/2008  DATE OF DISCHARGE:                               OPERATIVE REPORT   PREOPERATIVE DIAGNOSIS:  Herniated cervical disk with spondylolisthesis,  spondylosis, stenosis, degenerative disk disease, and radiculopathy at  C6-7 and C7-T1 levels.   POSTOPERATIVE DIAGNOSIS:  Herniated cervical disk with  spondylolisthesis, spondylosis, stenosis, degenerative disk disease, and  radiculopathy at C6-7 and C7-T1 levels.   PROCEDURE:  Anterior cervical decompression and fusion at C6-7 and C7-T1  levels with morselized bone autograft, allograft, EquivaBone, and  anterior cervical plate.   SURGEON:  Danae Orleans. Venetia Maxon, MD   ASSISTANT:  1. Stefani Dama, MD  2. Georgiann Cocker, RN   ANESTHESIA:  General endotracheal anesthesia.   ESTIMATED BLOOD LOSS:  Minimal.   COMPLICATIONS:  None.   DISPOSITION:  Recovery.   INDICATIONS:  Gwendolyn Williams is a 70 year old woman with fairly profound  left hand weakness and C8 radiculopathy.  She has significant  spondylosis and degenerative disk disease at C6-7 and spondylolisthesis  of C7 on T1 with severe foraminal stenosis at this level on the left.  It was elected to take her to Surgery for anterior cervical  decompression and fusion at C6-T1 levels.   PROCEDURE:  [name] was brought to the operating room.  Following  satisfactory and uncomplicated induction of general endotracheal  anesthesia and placement of intravenous lines, she was placed in the  supine position on the operating table.  The neck was placed in slight  extension.  She was placed in 5 pounds of halter traction.  Her anterior  neck was then prepped and draped in the usual sterile fashion.  Area of  planned  incision was infiltrated with local lidocaine.  An incision was  made in the left side of midline from midline to the anterior border of  the sternocleidomastoid muscle and carried sharply through the platysma  layer.  Subplatysmal dissection was performed exposing the anterior  border of the sternocleidomastoid muscle using blunt dissection.  The  carotid sheath was kept lateral and trachea and esophagus kept medial  exposing the anterior cervical spine.  A bent spinal needle was placed  in what was felt to the C5-6 and C6-7 levels and this was confirmed on  intraoperative x-ray.  Subsequently, the longus colli muscles were taken  down from anterior cervical spine from C6T1 bilaterally using  electrocautery and Key elevator.  A self-retaining shadow line retractor  was placed along with up and down retractors.  Large ventral osteophyte  at C6-7 was removed and the interspaces at each level were decompressed  and disk material was removed.  Endplates were stripped of residual disk  material using distraction pins initially at C7 and T1.  The interspace  was gently distracted and high-speed drill was used to decorticate  endplates.  Uncinate spurs were drilled  down with a smaller drill bit  and bone autograft saved for later use of bone grafting.  The  spondylitic material was removed and both the C8 nerve roots were  decompressed particularly on the left where large overlying osteophytes  were removed.  Spinal cord dura was decompressed after trial sizing and  confirmation of hemostasis.  A 6-mm allograft bone wedge was selected,  fashioned with high-speed drill, packed with morcellized bone autograft,  and EquivaBone was inserted in the interspace and countersunk  appropriately.  The inferior most distraction was moved to C6 and  thorough diskectomy was performed at this level.  Both the spinal cord  dura and C7 nerve roots were decompressed widely and a similarly sized  bone graft was  fashioned with high-speed drill, packed with morcellized  bone autograft, and EquivaBone was inserted in the interspace and  countersunk appropriately.  A Trestle anterior cervical plate was then  affixed to the anterior cervical spine using variable angle of the 14-mm  screws and 30-mm plate.  Two screws were placed at C6, two screws at C7,  and two screws at T1.  All screws had excellent purchase and locking  mechanisms were engaged.  Traction weight was removed prior to placing  plate.  Hemostasis was assured.  Final x-ray demonstrated well-  positioned interbody grafts and anterior cervical plate.  The wound was  irrigated.  Soft tissues were inspected and found to be in good repair.  The platysma layer was closed with 3-0 Vicryl sutures and the skin edges  were reapproximated with 3-0 Vicryl subcuticular stitch.  The wound was  dressed with Dermabond.  The patient was extubated in the operating room  and taken to the recovery room in stable satisfactory condition, having  tolerated the operation well.  Counts were correct at the end of the  case.      Danae Orleans. Venetia Maxon, M.D.  Electronically Signed     JDS/MEDQ  D:  05/23/2008  T:  05/23/2008  Job:  [id number]

## 2010-08-13 NOTE — Op Note (Signed)
Gwendolyn Williams, PAUL                ACCOUNT NO.:  [id number]   MEDICAL RECORD NO.:  [id number]          PATIENT TYPE:  AMB   LOCATION:  ENDO                         FAC[facility]   PHYSICIAN:  John C. Madilyn Fireman, M.D.    DATE OF BIRTH:  1940/07/23   DATE OF PROCEDURE:  08/01/2008  DATE OF DISCHARGE:                               OPERATIVE REPORT   PROCEDURE:  Colonoscopy.   INDICATIONS FOR PROCEDURE:  This is a patient who suffered ischemic  bowel requiring surgery and temporary colostomy.  The procedure is to  assess for any neoplastic or ischemic changes prior to planned  reanastomosis and colostomy takedown.   DESCRIPTION OF PROCEDURE:  The patient was placed in the left lateral  decubitus position and placed on the pulse monitor with continuous low-  flow oxygen delivered by nasal cannula.  She was sedated with 100 mcg IV  fentanyl and 7 mg IV Versed.  The Olympus video colonoscope was inserted  into the ostomy and advanced to cecum, confirmed by transillumination of  McBurney's point and visualization of the ileocecal valve and  appendiceal orifice.  The prep was excellent.  The cecum, ascending, and  transverse colon appeared normal with the exception of some diverticula  scattered throughout.  No polyps, masses, or ischemic changes were seen.  The ostomy site itself was pink and healthy and the mucosa appeared  normal all the way to the stoma.  The scope was then withdrawn and the  patient returned to the recovery room in stable condition.  She  tolerated the procedure well, and there were no immediate complications.   IMPRESSION:  Few scattered diverticula, otherwise normal colonoscopy  proximal to the stoma.           ______________________________  Everardo All. Madilyn Fireman, M.D.     JCH/MEDQ  D:  08/01/2008  T:  08/01/2008  Job:  [id number]   cc:   Juanetta Gosling, MD  [address]  [place] [address]   Genene Churn. Cyndie Chime, M.D.  Fax: [phone]

## 2010-08-13 NOTE — Consult Note (Signed)
Gwendolyn Williams, DESCH                ACCOUNT NO.:  [id number]   MEDICAL RECORD NO.:  [id number]          PATIENT TYPE:  INP   LOCATION:  6735                         FACILITY:  [facility]   PHYSICIAN:  John C. Madilyn Williams, M.D.    DATE OF BIRTH:  04-15-1940   DATE OF CONSULTATION:  04/08/2008  DATE OF DISCHARGE:                                 CONSULTATION   HISTORY OF PRESENT ILLNESS:  The patient is a 70 year old white female  who presented for C-spine surgery yesterday and shortly after arriving  around noon, she began having some crampy abdominal pain with urgency  and a few small bowel movements, but progressed having more severe  generalized abdominal pain.  She had never experienced any pain like  this before and was sent to the emergency room and her surgery  cancelled.  She had been on prednisone 60 mg a day, taper down to 40 mg  2 days ago.  When seen in the emergency room, she was noted to have a  white blood cell count of 20,000 and a CT scan of abdomen showed  thickening of the descending sigmoid colon with some mild mesenteric fat  stranding.  The patient had a very tortuous colon with a lot of stool.  She had a colonoscopy by me in 2004 which was unsuccessful due to the  tortuosity.  A followup barium enema in 2007 did show widespread  diverticulosis.   PAST MEDICAL HISTORY:  Cervical spine disease, gastroesophageal reflux,  acid reflux, and hyperlipidemia.   ALLERGIES:  PENICILLIN and SULFA.   MEDICATIONS:  Nexium, Dyazide, simvastatin, Singulair, Fosamax, aspirin,  oxycodone, Aleve, and also recently on prednisone as mentioned.   SOCIAL HISTORY:  The patient is a Runner, broadcasting/film/video.  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  She occasionally drinks wine.  She denies tobacco use.   PHYSICAL EXAMINATION:  GENERAL:  A pleasant, somewhat somnolent white  female in mild abdominal pain.  HEART:  Regular rate and rhythm without murmur.  ABDOMEN:  Soft, slightly distended with normoactive bowel sounds.   There  is mild tenderness in the left and midabdomen, I did not palpate deeply  given the results of previous abdominal exam.  I did not test for  rebound tenderness at the moment.   IMPRESSION:  Inflammatory process involving the left colon, likely  diverticulitis based on her known diverticulosis and presentation  without diarrhea, rectal bleeding, etc. to suggest an infectious or  ischemic colitis.   PLAN:  We will continue Cipro and Flagyl and serial examinations for  now.  We would expect that WBC count should drop soon.  On antibiotics,  the infection was successfully treated.  If she does not improve in the  course of a day or two, may need a repeat CT scan to rule out developing  abscess since her initial scan was so soon after the onset of acute  symptoms.           ______________________________  Gwendolyn Williams, M.D.     JCH/MEDQ  D:  04/08/2008  T:  04/08/2008  Job:  [id number]  cc:   Bryan Lemma. Manus Gunning, M.D.

## 2010-08-20 ENCOUNTER — Encounter (HOSPITAL_BASED_OUTPATIENT_CLINIC_OR_DEPARTMENT_OTHER): Payer: Managed Care, Other (non HMO) | Admitting: Oncology

## 2010-08-20 ENCOUNTER — Other Ambulatory Visit: Payer: Self-pay | Admitting: Oncology

## 2010-08-20 DIAGNOSIS — D6859 Other primary thrombophilia: Secondary | ICD-10-CM

## 2010-08-20 DIAGNOSIS — Z7901 Long term (current) use of anticoagulants: Secondary | ICD-10-CM

## 2010-08-20 LAB — CBC & DIFF AND RETIC
BASO%: 0.7 % (ref 0.0–2.0)
Basophils Absolute: 0.1 10*3/uL (ref 0.0–0.1)
EOS%: 2.9 % (ref 0.0–7.0)
Eosinophils Absolute: 0.2 10*3/uL (ref 0.0–0.5)
HCT: 35.7 % (ref 34.8–46.6)
HGB: 11.1 g/dL — ABNORMAL LOW (ref 11.6–15.9)
Immature Retic Fract: 6.2 % (ref 0.00–10.70)
LYMPH%: 21.1 % (ref 14.0–49.7)
MCH: 22.4 pg — ABNORMAL LOW (ref 25.1–34.0)
MCHC: 31.1 g/dL — ABNORMAL LOW (ref 31.5–36.0)
MCV: 72.1 fL — ABNORMAL LOW (ref 79.5–101.0)
MONO#: 0.4 10*3/uL (ref 0.1–0.9)
MONO%: 5.7 % (ref 0.0–14.0)
NEUT#: 5 10*3/uL (ref 1.5–6.5)
NEUT%: 69.6 % (ref 38.4–76.8)
Platelets: 422 10*3/uL — ABNORMAL HIGH (ref 145–400)
RBC: 4.95 10*6/uL (ref 3.70–5.45)
RDW: 17.9 % — ABNORMAL HIGH (ref 11.2–14.5)
Retic %: 0.9 % (ref 0.50–1.50)
Retic Ct Abs: 44.55 10*3/uL (ref 18.30–72.70)
WBC: 7.2 10*3/uL (ref 3.9–10.3)
lymph#: 1.5 10*3/uL (ref 0.9–3.3)

## 2010-08-20 LAB — FERRITIN: Ferritin: 7 ng/mL — ABNORMAL LOW (ref 10–291)

## 2010-08-20 LAB — PROTIME-INR
INR: 2.1 (ref 2.00–3.50)
Protime: 25.2 Seconds — ABNORMAL HIGH (ref 10.6–13.4)

## 2010-08-20 LAB — IRON AND TIBC
%SAT: 5 % — ABNORMAL LOW (ref 20–55)
Iron: 23 ug/dL — ABNORMAL LOW (ref 42–145)
TIBC: 446 ug/dL (ref 250–470)
UIBC: 423 ug/dL

## 2010-08-30 ENCOUNTER — Encounter (INDEPENDENT_AMBULATORY_CARE_PROVIDER_SITE_OTHER): Payer: Self-pay | Admitting: General Surgery

## 2010-09-17 ENCOUNTER — Encounter (HOSPITAL_BASED_OUTPATIENT_CLINIC_OR_DEPARTMENT_OTHER): Payer: Medicare Other | Admitting: Oncology

## 2010-09-17 ENCOUNTER — Other Ambulatory Visit: Payer: Self-pay | Admitting: Oncology

## 2010-09-17 DIAGNOSIS — Z7901 Long term (current) use of anticoagulants: Secondary | ICD-10-CM

## 2010-09-17 DIAGNOSIS — D6859 Other primary thrombophilia: Secondary | ICD-10-CM

## 2010-09-17 LAB — CBC WITH DIFFERENTIAL/PLATELET
BASO%: 0.8 % (ref 0.0–2.0)
Basophils Absolute: 0.1 10*3/uL (ref 0.0–0.1)
EOS%: 2.6 % (ref 0.0–7.0)
Eosinophils Absolute: 0.2 10*3/uL (ref 0.0–0.5)
HCT: 40.8 % (ref 34.8–46.6)
HGB: 12.9 g/dL (ref 11.6–15.9)
LYMPH%: 16.1 % (ref 14.0–49.7)
MCH: 23.8 pg — ABNORMAL LOW (ref 25.1–34.0)
MCHC: 31.6 g/dL (ref 31.5–36.0)
MCV: 75.1 fL — ABNORMAL LOW (ref 79.5–101.0)
MONO#: 0.4 10*3/uL (ref 0.1–0.9)
MONO%: 5.4 % (ref 0.0–14.0)
NEUT#: 5.5 10*3/uL (ref 1.5–6.5)
NEUT%: 75.1 % (ref 38.4–76.8)
Platelets: 407 10*3/uL — ABNORMAL HIGH (ref 145–400)
RBC: 5.43 10*6/uL (ref 3.70–5.45)
RDW: 21.2 % — ABNORMAL HIGH (ref 11.2–14.5)
WBC: 7.4 10*3/uL (ref 3.9–10.3)
lymph#: 1.2 10*3/uL (ref 0.9–3.3)
nRBC: 0 % (ref 0–0)

## 2010-09-17 LAB — PROTIME-INR
INR: 2 (ref 2.00–3.50)
Protime: 24 Seconds — ABNORMAL HIGH (ref 10.6–13.4)

## 2010-10-15 ENCOUNTER — Other Ambulatory Visit: Payer: Self-pay | Admitting: Oncology

## 2010-10-15 ENCOUNTER — Encounter (HOSPITAL_BASED_OUTPATIENT_CLINIC_OR_DEPARTMENT_OTHER): Payer: Medicare Other | Admitting: Oncology

## 2010-10-15 DIAGNOSIS — Z86718 Personal history of other venous thrombosis and embolism: Secondary | ICD-10-CM

## 2010-10-15 DIAGNOSIS — Z7901 Long term (current) use of anticoagulants: Secondary | ICD-10-CM

## 2010-10-15 DIAGNOSIS — D6859 Other primary thrombophilia: Secondary | ICD-10-CM

## 2010-10-15 LAB — PROTIME-INR
INR: 2.2 (ref 2.00–3.50)
Protime: 26.4 Seconds — ABNORMAL HIGH (ref 10.6–13.4)

## 2010-11-26 ENCOUNTER — Encounter (HOSPITAL_BASED_OUTPATIENT_CLINIC_OR_DEPARTMENT_OTHER): Payer: Medicare Other | Admitting: Oncology

## 2010-11-26 ENCOUNTER — Other Ambulatory Visit: Payer: Self-pay | Admitting: Oncology

## 2010-11-26 DIAGNOSIS — Z86718 Personal history of other venous thrombosis and embolism: Secondary | ICD-10-CM

## 2010-11-26 DIAGNOSIS — D6859 Other primary thrombophilia: Secondary | ICD-10-CM

## 2010-11-26 DIAGNOSIS — Z7901 Long term (current) use of anticoagulants: Secondary | ICD-10-CM

## 2010-11-26 LAB — CBC WITH DIFFERENTIAL/PLATELET
BASO%: 0.2 % (ref 0.0–2.0)
Basophils Absolute: 0 10*3/uL (ref 0.0–0.1)
EOS%: 1.8 % (ref 0.0–7.0)
Eosinophils Absolute: 0.2 10*3/uL (ref 0.0–0.5)
HCT: 42.3 % (ref 34.8–46.6)
HGB: 14.4 g/dL (ref 11.6–15.9)
LYMPH%: 13.9 % — ABNORMAL LOW (ref 14.0–49.7)
MCH: 27.6 pg (ref 25.1–34.0)
MCHC: 34 g/dL (ref 31.5–36.0)
MCV: 81.2 fL (ref 79.5–101.0)
MONO#: 0.2 10*3/uL (ref 0.1–0.9)
MONO%: 2.3 % (ref 0.0–14.0)
NEUT#: 7.1 10*3/uL — ABNORMAL HIGH (ref 1.5–6.5)
NEUT%: 81.8 % — ABNORMAL HIGH (ref 38.4–76.8)
Platelets: 386 10*3/uL (ref 145–400)
RBC: 5.22 10*6/uL (ref 3.70–5.45)
RDW: 19.3 % — ABNORMAL HIGH (ref 11.2–14.5)
WBC: 8.7 10*3/uL (ref 3.9–10.3)
lymph#: 1.2 10*3/uL (ref 0.9–3.3)

## 2010-11-26 LAB — PROTIME-INR
INR: 2.1 (ref 2.00–3.50)
Protime: 25.2 Seconds — ABNORMAL HIGH (ref 10.6–13.4)

## 2010-12-26 ENCOUNTER — Encounter (HOSPITAL_BASED_OUTPATIENT_CLINIC_OR_DEPARTMENT_OTHER): Payer: Medicare Other | Admitting: Oncology

## 2010-12-26 ENCOUNTER — Other Ambulatory Visit: Payer: Self-pay | Admitting: Oncology

## 2010-12-26 DIAGNOSIS — Z86718 Personal history of other venous thrombosis and embolism: Secondary | ICD-10-CM

## 2010-12-26 DIAGNOSIS — D6859 Other primary thrombophilia: Secondary | ICD-10-CM

## 2010-12-26 DIAGNOSIS — Z7901 Long term (current) use of anticoagulants: Secondary | ICD-10-CM

## 2010-12-26 LAB — CBC WITH DIFFERENTIAL/PLATELET
BASO%: 0.8 % (ref 0.0–2.0)
Basophils Absolute: 0.1 10*3/uL (ref 0.0–0.1)
EOS%: 2.1 % (ref 0.0–7.0)
Eosinophils Absolute: 0.2 10*3/uL (ref 0.0–0.5)
HCT: 43.6 % (ref 34.8–46.6)
HGB: 14.7 g/dL (ref 11.6–15.9)
LYMPH%: 18.6 % (ref 14.0–49.7)
MCH: 27.8 pg (ref 25.1–34.0)
MCHC: 33.7 g/dL (ref 31.5–36.0)
MCV: 82.6 fL (ref 79.5–101.0)
MONO#: 0.5 10*3/uL (ref 0.1–0.9)
MONO%: 5.9 % (ref 0.0–14.0)
NEUT#: 5.6 10*3/uL (ref 1.5–6.5)
NEUT%: 72.6 % (ref 38.4–76.8)
Platelets: 400 10*3/uL (ref 145–400)
RBC: 5.28 10*6/uL (ref 3.70–5.45)
RDW: 16 % — ABNORMAL HIGH (ref 11.2–14.5)
WBC: 7.7 10*3/uL (ref 3.9–10.3)
lymph#: 1.4 10*3/uL (ref 0.9–3.3)
nRBC: 0 % (ref 0–0)

## 2010-12-26 LAB — PROTIME-INR
INR: 2 (ref 2.00–3.50)
Protime: 24 Seconds — ABNORMAL HIGH (ref 10.6–13.4)

## 2011-01-06 ENCOUNTER — Encounter (INDEPENDENT_AMBULATORY_CARE_PROVIDER_SITE_OTHER): Payer: Self-pay | Admitting: General Surgery

## 2011-01-06 ENCOUNTER — Ambulatory Visit (INDEPENDENT_AMBULATORY_CARE_PROVIDER_SITE_OTHER): Payer: Medicare Other | Admitting: General Surgery

## 2011-01-06 VITALS — BP 130/82 | HR 76 | Temp 96.8°F | Ht 60.0 in | Wt 103.0 lb

## 2011-01-06 DIAGNOSIS — D47Z9 Other specified neoplasms of uncertain behavior of lymphoid, hematopoietic and related tissue: Secondary | ICD-10-CM

## 2011-01-06 DIAGNOSIS — D471 Chronic myeloproliferative disease: Secondary | ICD-10-CM

## 2011-01-06 DIAGNOSIS — Z853 Personal history of malignant neoplasm of breast: Secondary | ICD-10-CM | POA: Insufficient documentation

## 2011-01-06 NOTE — Progress Notes (Signed)
Subjective:     Patient ID: Gwendolyn Williams, female   DOB: June 30, 1940, 70 y.o.   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 She was treated with a left breast lumpectomy as well as a sentinel node biopsy. She underwent 4 cycles of TC and then underwent radiation therapy as well. I saw her about 6 months or so ago now. She's really done well since then and complains only some bloating in her right lower quadrant on her abdominal exam. She has no complaints referable to her breasts. She has a normal mammogram that was done a [facility] last week and she was recommended to follow up in one year.  Review of Systems     Objective:   Physical Exam  Constitutional: She appears well-developed and well-nourished.  Pulmonary/Chest: Right breast exhibits no inverted nipple, no mass, no nipple discharge, no skin change and no tenderness. Left breast exhibits no inverted nipple, no mass, no nipple discharge, no skin change and no tenderness. Breasts are asymmetrical (right breast with indentation at area of scar and hardened breast throughout consistent with radiation changes).    Abdominal: There is no tenderness. No hernia. Hernia confirmed negative in the ventral area.    Lymphadenopathy:    She has no cervical adenopathy.    She has no axillary adenopathy.       Right: No supraclavicular adenopathy present.       Left: No supraclavicular adenopathy present.       Assessment:     History stage I left breast cancer S/p colectomy, ostomy takedown, LVH    Plan:     She is really doing well with no recurrence of her breast cancer. The one area she was concerned about on her abdominal examination I do not feel a hernia present I think her abdominal wall is all intact. She is going to  continue all of her normal activities. 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 She is seeing Dr. Cyndie Chime every 6 months and she's going to continue that. I offered her that I did not need to see her back I will be happy to. She has elected to see me probably once a year. I'm and have her come back and see me in about 9 months.

## 2011-01-06 NOTE — Patient Instructions (Signed)
Breast Problems and Self Exam Completing monthly breast exams may pick up problems early and save lives. There can be numerous causes of swelling, tenderness or lumps in the breasts. Some of these causes are:   Fibrocystic breast syndrome (noncancerous lumps). This is the most common cause of lumps in the breast.   Fibroadenoma breast tumors of unknown cause. These are noncancerous (benign) lumps.   Benign fatty tumors (lipomas).   Cancer of the breast.  By doing monthly breast exams, you get to know how your breasts feel and how they can change from month to month. This allows you to notice changes early. It can also offer you some reassurance that your breast health is good.  BREAST SELF EXAM There are a few points to follow when doing a thorough breast exam. The best time to examine your breasts is 5 to 7 days after your menstrual period is over. During menstruation, the breasts are lumpier, and it may be more difficult to pick up changes. If you do not menstruate, have reached menopause, or had a hysterectomy (uterus removal), examine your breasts the first day of every month. After 3 to 4 months, you will become more familiar with the variations of your breasts and more comfortable with the exam.  Perform your breast exam monthly. Keep a written record with breast changes or normal findings for each breast. This makes it easier to be sure of changes, so you do not need to depend only on memory for size, tenderness, or location. Try to do the exam at the same time each month, and write down where you are in your menstrual cycle, if you are still menstruating.   Look at your breasts. Stand in front of a mirror with your hands clasped behind your head. Tighten your chest muscles and look for asymmetry. This means a difference in shape or contour from one breast to the other, such as puckers, dips or bumps. Also, look for skin changes.    Lean forward with your hands on your hips. Again, look for  symmetry and skin changes.   While showering, soap the breasts. Then, carefully feel the breasts with your fingertips, while holding the other arm (on the side of the breast you are examining) over your head. Do this with each breast, carefully feeling for lumps or changes. Typically, a circular motion with moderate fingertip pressure should be used.    Repeat this exam while lying on your back. Put your arm behind your head and a pillow under your shoulders. Again, use your fingertips to examine both breasts, feeling for lumps and thickening. Begin at the top of your breast, and go clockwise around the whole breast.   At the end of your exam, gently squeeze each nipple to see if there is any drainage of fluids. Look for nipple changes, dimpling, or redness.   Lastly, examine the upper chest and collarbone (clavicle) areas, and in your armpits.  It is not necessary to be alarmed if you find a breast lump. Most of them are not cancerous. However, it is necessary to see your caregiver if a lump is found, in order to have it looked at. Document Released: 03/17/2005 Document Re-Released: 04/08/2009 ExitCare Patient Information 2011 [employer]. 

## 2011-01-28 ENCOUNTER — Other Ambulatory Visit: Payer: Self-pay | Admitting: Oncology

## 2011-01-28 ENCOUNTER — Ambulatory Visit (HOSPITAL_BASED_OUTPATIENT_CLINIC_OR_DEPARTMENT_OTHER): Payer: Medicare Other | Admitting: Oncology

## 2011-01-28 DIAGNOSIS — D6859 Other primary thrombophilia: Secondary | ICD-10-CM

## 2011-01-28 DIAGNOSIS — Z7901 Long term (current) use of anticoagulants: Secondary | ICD-10-CM

## 2011-01-28 DIAGNOSIS — K55059 Acute (reversible) ischemia of intestine, part and extent unspecified: Secondary | ICD-10-CM

## 2011-01-28 DIAGNOSIS — C50919 Malignant neoplasm of unspecified site of unspecified female breast: Secondary | ICD-10-CM

## 2011-01-28 DIAGNOSIS — Z170 Estrogen receptor positive status [ER+]: Secondary | ICD-10-CM

## 2011-01-28 DIAGNOSIS — D47Z9 Other specified neoplasms of uncertain behavior of lymphoid, hematopoietic and related tissue: Secondary | ICD-10-CM

## 2011-01-28 LAB — CBC WITH DIFFERENTIAL/PLATELET
BASO%: 0.1 % (ref 0.0–2.0)
Basophils Absolute: 0 10*3/uL (ref 0.0–0.1)
EOS%: 1.7 % (ref 0.0–7.0)
Eosinophils Absolute: 0.1 10*3/uL (ref 0.0–0.5)
HCT: 41.3 % (ref 34.8–46.6)
HGB: 13.8 g/dL (ref 11.6–15.9)
LYMPH%: 14.7 % (ref 14.0–49.7)
MCH: 28.2 pg (ref 25.1–34.0)
MCHC: 33.4 g/dL (ref 31.5–36.0)
MCV: 84.4 fL (ref 79.5–101.0)
MONO#: 0.3 10*3/uL (ref 0.1–0.9)
MONO%: 3.6 % (ref 0.0–14.0)
NEUT#: 6.4 10*3/uL (ref 1.5–6.5)
NEUT%: 79.9 % — ABNORMAL HIGH (ref 38.4–76.8)
Platelets: 400 10*3/uL (ref 145–400)
RBC: 4.89 10*6/uL (ref 3.70–5.45)
RDW: 15.5 % — ABNORMAL HIGH (ref 11.2–14.5)
WBC: 8 10*3/uL (ref 3.9–10.3)
lymph#: 1.2 10*3/uL (ref 0.9–3.3)

## 2011-01-28 LAB — PROTIME-INR
INR: 2.6 (ref 2.00–3.50)
Protime: 31.2 s — ABNORMAL HIGH (ref 10.6–13.4)

## 2011-01-28 LAB — IRON AND TIBC
%SAT: 18 % — ABNORMAL LOW (ref 20–55)
Iron: 56 ug/dL (ref 42–145)
TIBC: 312 ug/dL (ref 250–470)
UIBC: 256 ug/dL (ref 125–400)

## 2011-01-28 LAB — FERRITIN: Ferritin: 45 ng/mL (ref 10–291)

## 2011-02-04 DIAGNOSIS — K55069 Acute infarction of intestine, part and extent unspecified: Secondary | ICD-10-CM | POA: Insufficient documentation

## 2011-03-03 ENCOUNTER — Telehealth: Payer: Self-pay | Admitting: Pharmacist

## 2011-03-11 ENCOUNTER — Other Ambulatory Visit: Payer: Self-pay | Admitting: Pharmacist

## 2011-03-11 DIAGNOSIS — K55069 Acute infarction of intestine, part and extent unspecified: Secondary | ICD-10-CM

## 2011-03-18 ENCOUNTER — Ambulatory Visit: Payer: Medicare Other

## 2011-03-18 ENCOUNTER — Other Ambulatory Visit: Payer: Medicare Other | Admitting: Lab

## 2011-03-20 ENCOUNTER — Ambulatory Visit: Payer: Medicare Other

## 2011-03-20 ENCOUNTER — Ambulatory Visit: Payer: Self-pay | Admitting: Oncology

## 2011-03-20 ENCOUNTER — Other Ambulatory Visit (HOSPITAL_BASED_OUTPATIENT_CLINIC_OR_DEPARTMENT_OTHER): Payer: Medicare Other

## 2011-03-20 DIAGNOSIS — K55069 Acute infarction of intestine, part and extent unspecified: Secondary | ICD-10-CM

## 2011-03-20 DIAGNOSIS — K55059 Acute (reversible) ischemia of intestine, part and extent unspecified: Secondary | ICD-10-CM

## 2011-03-20 LAB — PROTIME-INR
INR: 2.1 (ref 2.00–3.50)
Protime: 25.2 Seconds — ABNORMAL HIGH (ref 10.6–13.4)

## 2011-03-20 LAB — POCT INR: INR: 2.1

## 2011-03-21 ENCOUNTER — Ambulatory Visit: Payer: Medicare Other

## 2011-03-21 ENCOUNTER — Other Ambulatory Visit: Payer: Medicare Other | Admitting: Lab

## 2011-04-03 ENCOUNTER — Other Ambulatory Visit: Payer: Self-pay

## 2011-04-03 DIAGNOSIS — C50919 Malignant neoplasm of unspecified site of unspecified female breast: Secondary | ICD-10-CM

## 2011-04-03 MED ORDER — WARFARIN SODIUM 5 MG PO TABS: 5.0000 mg | ORAL_TABLET | Freq: Every day | ORAL | Status: DC

## 2011-04-03 MED ORDER — ANASTROZOLE 1 MG PO TABS: 1.0000 mg | ORAL_TABLET | Freq: Every day | ORAL | Status: DC

## 2011-04-03 NOTE — Progress Notes (Signed)
Scripts mailed to pt's home for arimidex & coumadin for mail order pharmacy.  dph

## 2011-04-18 ENCOUNTER — Telehealth: Payer: Self-pay | Admitting: *Deleted

## 2011-04-18 NOTE — Telephone Encounter (Signed)
Received call from pts husband asking if we had received anything from [facility] re: pts coumadin.  They need a statement for tier exeption.  Returned call & spoke with pt & Mr. Shader who reports that pt uses brand name coumadin & [employer] needs a letter of exception in order to get the drug at a lesser cost.  He has ordered some from [place] but this may take a while.  She has enough coumadin for a few wks but may need script for local pharmacy before mail order comes in.  He was informed that I have not seen anything from [facility] but will check on this & ask Dr. Cyndie Chime about a letter on his return next week.  They were OK with this.   Husband can be reached at [phone] Aneta Mins)

## 2011-04-24 ENCOUNTER — Telehealth: Payer: Self-pay | Admitting: *Deleted

## 2011-04-24 NOTE — Telephone Encounter (Signed)
Drug Prior Auth request form received & filled out by Dr. Cyndie Chime & fax returned to [phone] & also to [phone].  Form to be scanned.

## 2011-04-24 NOTE — Telephone Encounter (Signed)
Received vm call from [employer] [facility] stating they had faxed a form 04/09/11 & made a f/u call 04/22/11 & haven't received anything yet.  She reports that this is the 3rd & final request & if nothing received in next 24 h claim will be denied.  She can be reached by phone @ [phone] or fax [phone].  Message left with [facility] & message will be left with Dr. Cyndie Chime.

## 2011-04-25 ENCOUNTER — Telehealth: Payer: Self-pay | Admitting: *Deleted

## 2011-04-25 NOTE — Telephone Encounter (Signed)
Receoved vm call from someone from [facility] stating that coumadin has been approved for 1 year at a reduced cost.  The ph # that showed up on my ph was [phone].

## 2011-05-12 ENCOUNTER — Other Ambulatory Visit: Payer: Self-pay | Admitting: Pharmacist

## 2011-05-12 DIAGNOSIS — K55069 Acute infarction of intestine, part and extent unspecified: Secondary | ICD-10-CM

## 2011-05-14 ENCOUNTER — Ambulatory Visit (HOSPITAL_BASED_OUTPATIENT_CLINIC_OR_DEPARTMENT_OTHER): Payer: Medicare Other | Admitting: Pharmacist

## 2011-05-14 ENCOUNTER — Other Ambulatory Visit: Payer: Medicare Other | Admitting: Lab

## 2011-05-14 DIAGNOSIS — K55069 Acute infarction of intestine, part and extent unspecified: Secondary | ICD-10-CM

## 2011-05-14 DIAGNOSIS — Z7901 Long term (current) use of anticoagulants: Secondary | ICD-10-CM

## 2011-05-14 DIAGNOSIS — K55059 Acute (reversible) ischemia of intestine, part and extent unspecified: Secondary | ICD-10-CM

## 2011-05-14 LAB — PROTIME-INR
INR: 3.1 (ref 2.00–3.50)
Protime: 37.2 Seconds — ABNORMAL HIGH (ref 10.6–13.4)

## 2011-05-14 LAB — POCT INR: INR: 3.1

## 2011-05-14 NOTE — Progress Notes (Signed)
INR a little over range.  However pt has been on this dose for 1 year (5 mg/day). Pt brought in a bottle of Coumadin 5mg  that she & her husband purchased from [place] since they have lost prescription insurance.  The Coumadin tablet is green & is manufactured by [facility] [employer] in [place]. Pt takes brand Coumadin.  We have plenty of samples of brand Coumadin 5 mg (these are peach tabs) so I gave her 60 tablets today & instructed her to put the green Coumadin tablets away since we can't be 100% sure that it really is Coumadin 5 mg.  She feels better about this decision & appreciated the samples. We will keep Fannie Knee on the same dose of Coumadin even with an INR = 3.1 today.   She requested to be seen in 2 weeks to make sure her INR falls back between 2-3. Marily Lente, Pharm.D.

## 2011-05-29 ENCOUNTER — Telehealth: Payer: Self-pay | Admitting: Pharmacist

## 2011-05-29 NOTE — Telephone Encounter (Signed)
Spoke with husband to let him know we moved the appmt 15 min for lab at 3:45 and cc at 4p per his request for Fri, Mar 1 appmt. Left message with Rose in scheduling as well.

## 2011-05-30 ENCOUNTER — Ambulatory Visit (HOSPITAL_BASED_OUTPATIENT_CLINIC_OR_DEPARTMENT_OTHER): Payer: Medicare Other | Admitting: Pharmacist

## 2011-05-30 ENCOUNTER — Other Ambulatory Visit: Payer: Medicare Other | Admitting: Lab

## 2011-05-30 DIAGNOSIS — K55069 Acute infarction of intestine, part and extent unspecified: Secondary | ICD-10-CM

## 2011-05-30 DIAGNOSIS — K55059 Acute (reversible) ischemia of intestine, part and extent unspecified: Secondary | ICD-10-CM

## 2011-05-30 DIAGNOSIS — C50919 Malignant neoplasm of unspecified site of unspecified female breast: Secondary | ICD-10-CM

## 2011-05-30 LAB — PROTIME-INR
INR: 1.6 — ABNORMAL LOW (ref 2.00–3.50)
Protime: 19.2 Seconds — ABNORMAL HIGH (ref 10.6–13.4)

## 2011-05-30 LAB — POCT INR: INR: 1.6

## 2011-05-30 MED ORDER — WARFARIN SODIUM 5 MG PO TABS: 5.0000 mg | ORAL_TABLET | Freq: Every day | ORAL | Status: DC

## 2011-05-30 NOTE — Progress Notes (Signed)
INR now subtherapeutic.   No missed doses. The only possible cause is that she has eaten cabbage soup 2 times this week.   Take 7.5 mg tonight only then back to 5 mg/day. Return in 1 week. Marily Lente, Pharm.D.

## 2011-06-06 ENCOUNTER — Other Ambulatory Visit (HOSPITAL_BASED_OUTPATIENT_CLINIC_OR_DEPARTMENT_OTHER): Payer: Medicare Other | Admitting: Lab

## 2011-06-06 ENCOUNTER — Ambulatory Visit (HOSPITAL_BASED_OUTPATIENT_CLINIC_OR_DEPARTMENT_OTHER): Payer: Medicare Other | Admitting: Pharmacist

## 2011-06-06 DIAGNOSIS — Z7901 Long term (current) use of anticoagulants: Secondary | ICD-10-CM

## 2011-06-06 DIAGNOSIS — K55069 Acute infarction of intestine, part and extent unspecified: Secondary | ICD-10-CM

## 2011-06-06 DIAGNOSIS — Z86718 Personal history of other venous thrombosis and embolism: Secondary | ICD-10-CM

## 2011-06-06 DIAGNOSIS — Z5181 Encounter for therapeutic drug level monitoring: Secondary | ICD-10-CM

## 2011-06-06 DIAGNOSIS — K55059 Acute (reversible) ischemia of intestine, part and extent unspecified: Secondary | ICD-10-CM

## 2011-06-06 LAB — PROTIME-INR
INR: 2.3 (ref 2.00–3.50)
Protime: 27.6 Seconds — ABNORMAL HIGH (ref 10.6–13.4)

## 2011-06-06 LAB — POCT INR: INR: 2.3

## 2011-06-06 NOTE — Progress Notes (Signed)
INR back within goal this visit.  Probably the dietary intake of Vit K that contributed to the incidental low INR last time. Back to 5 mg/day. Return in 2 weeks to see how she is tracking once back on her maintenance dose. Marily Lente, Pharm.D.

## 2011-06-16 ENCOUNTER — Telehealth: Payer: Self-pay | Admitting: Oncology

## 2011-06-16 NOTE — Telephone Encounter (Signed)
S/w the pt and she is aware of her r/s appt from 08/01/2011 to 08/04/2011 due to the md is out of the office

## 2011-06-19 ENCOUNTER — Other Ambulatory Visit: Payer: Medicare Other | Admitting: Lab

## 2011-06-19 ENCOUNTER — Ambulatory Visit (HOSPITAL_BASED_OUTPATIENT_CLINIC_OR_DEPARTMENT_OTHER): Payer: Medicare Other | Admitting: Pharmacist

## 2011-06-19 DIAGNOSIS — K55069 Acute infarction of intestine, part and extent unspecified: Secondary | ICD-10-CM

## 2011-06-19 DIAGNOSIS — K55059 Acute (reversible) ischemia of intestine, part and extent unspecified: Secondary | ICD-10-CM

## 2011-06-19 LAB — PROTIME-INR
INR: 2.4 (ref 2.00–3.50)
Protime: 28.8 Seconds — ABNORMAL HIGH (ref 10.6–13.4)

## 2011-06-19 LAB — POCT INR: INR: 2.4

## 2011-06-19 NOTE — Patient Instructions (Signed)
Continue same dose of 5 mg daily.  No changes or complaints.  Asked to see [place] in CC one more time before her May 6 appmt with MD.  Although, we would have anyway, she is scheduled to come back in 3 weeks on April 11.

## 2011-07-10 ENCOUNTER — Other Ambulatory Visit: Payer: Medicare Other | Admitting: Lab

## 2011-07-10 ENCOUNTER — Ambulatory Visit (HOSPITAL_BASED_OUTPATIENT_CLINIC_OR_DEPARTMENT_OTHER): Payer: Medicare Other | Admitting: Pharmacist

## 2011-07-10 DIAGNOSIS — K55069 Acute infarction of intestine, part and extent unspecified: Secondary | ICD-10-CM

## 2011-07-10 DIAGNOSIS — K55059 Acute (reversible) ischemia of intestine, part and extent unspecified: Secondary | ICD-10-CM

## 2011-07-10 LAB — PROTIME-INR
INR: 2.5 (ref 2.00–3.50)
Protime: 30 Seconds — ABNORMAL HIGH (ref 10.6–13.4)

## 2011-07-10 NOTE — Progress Notes (Signed)
INR therapeutic (2.5).  No changes to meds, diet.  No problems.  Continue current dose of 5mg  daily.  Recheck INR in 4 weeks with next lab/MD appt.

## 2011-08-01 ENCOUNTER — Other Ambulatory Visit: Payer: Medicare Other | Admitting: Lab

## 2011-08-01 ENCOUNTER — Ambulatory Visit: Payer: Medicare Other | Admitting: Oncology

## 2011-08-04 ENCOUNTER — Ambulatory Visit: Payer: Medicare Other | Admitting: Pharmacist

## 2011-08-04 ENCOUNTER — Ambulatory Visit (HOSPITAL_BASED_OUTPATIENT_CLINIC_OR_DEPARTMENT_OTHER): Payer: Medicare Other | Admitting: Oncology

## 2011-08-04 ENCOUNTER — Other Ambulatory Visit (HOSPITAL_BASED_OUTPATIENT_CLINIC_OR_DEPARTMENT_OTHER): Payer: Medicare Other | Admitting: Lab

## 2011-08-04 VITALS — BP 134/79 | HR 67 | Temp 97.0°F | Ht 60.0 in | Wt 104.9 lb

## 2011-08-04 DIAGNOSIS — K55069 Acute infarction of intestine, part and extent unspecified: Secondary | ICD-10-CM

## 2011-08-04 DIAGNOSIS — D471 Chronic myeloproliferative disease: Secondary | ICD-10-CM

## 2011-08-04 DIAGNOSIS — Z86718 Personal history of other venous thrombosis and embolism: Secondary | ICD-10-CM

## 2011-08-04 DIAGNOSIS — Z170 Estrogen receptor positive status [ER+]: Secondary | ICD-10-CM

## 2011-08-04 DIAGNOSIS — C50919 Malignant neoplasm of unspecified site of unspecified female breast: Secondary | ICD-10-CM

## 2011-08-04 DIAGNOSIS — Z7901 Long term (current) use of anticoagulants: Secondary | ICD-10-CM

## 2011-08-04 DIAGNOSIS — D6859 Other primary thrombophilia: Secondary | ICD-10-CM

## 2011-08-04 DIAGNOSIS — D47Z9 Other specified neoplasms of uncertain behavior of lymphoid, hematopoietic and related tissue: Secondary | ICD-10-CM

## 2011-08-04 LAB — CBC WITH DIFFERENTIAL/PLATELET
BASO%: 0.9 % (ref 0.0–2.0)
Basophils Absolute: 0.1 10*3/uL (ref 0.0–0.1)
EOS%: 2.1 % (ref 0.0–7.0)
Eosinophils Absolute: 0.2 10*3/uL (ref 0.0–0.5)
HCT: 43.4 % (ref 34.8–46.6)
HGB: 14 g/dL (ref 11.6–15.9)
LYMPH%: 16.3 % (ref 14.0–49.7)
MCH: 27.7 pg (ref 25.1–34.0)
MCHC: 32.4 g/dL (ref 31.5–36.0)
MCV: 85.6 fL (ref 79.5–101.0)
MONO#: 0.5 10*3/uL (ref 0.1–0.9)
MONO%: 6.4 % (ref 0.0–14.0)
NEUT#: 5.6 10*3/uL (ref 1.5–6.5)
NEUT%: 74.3 % (ref 38.4–76.8)
Platelets: 406 10*3/uL — ABNORMAL HIGH (ref 145–400)
RBC: 5.06 10*6/uL (ref 3.70–5.45)
RDW: 15 % — ABNORMAL HIGH (ref 11.2–14.5)
WBC: 7.6 10*3/uL (ref 3.9–10.3)
lymph#: 1.2 10*3/uL (ref 0.9–3.3)
nRBC: 0 % (ref 0–0)

## 2011-08-04 LAB — PROTIME-INR
INR: 2.1 (ref 2.00–3.50)
Protime: 25.2 Seconds — ABNORMAL HIGH (ref 10.6–13.4)

## 2011-08-04 LAB — COMPREHENSIVE METABOLIC PANEL
ALT: 16 U/L (ref 0–35)
AST: 17 U/L (ref 0–37)
Albumin: 4 g/dL (ref 3.5–5.2)
Alkaline Phosphatase: 49 U/L (ref 39–117)
BUN: 16 mg/dL (ref 6–23)
CO2: 24 mEq/L (ref 19–32)
Calcium: 9.7 mg/dL (ref 8.4–10.5)
Chloride: 103 mEq/L (ref 96–112)
Creatinine, Ser: 0.88 mg/dL (ref 0.50–1.10)
Glucose, Bld: 82 mg/dL (ref 70–99)
Potassium: 3.7 mEq/L (ref 3.5–5.3)
Sodium: 138 mEq/L (ref 135–145)
Total Bilirubin: 0.4 mg/dL (ref 0.3–1.2)
Total Protein: 6.7 g/dL (ref 6.0–8.3)

## 2011-08-04 LAB — POCT INR: INR: 2.1

## 2011-08-04 LAB — LACTATE DEHYDROGENASE: LDH: 265 U/L — ABNORMAL HIGH (ref 94–250)

## 2011-08-04 NOTE — Patient Instructions (Signed)
Continue taking 5mg  daily. Recheck INR in 1 month. Call us with any medication changes or if you have any questions/concerns.

## 2011-08-04 NOTE — Progress Notes (Signed)
Continue taking 5mg  daily. Recheck INR in 1 month.

## 2011-08-05 ENCOUNTER — Telehealth: Payer: Self-pay | Admitting: Oncology

## 2011-08-05 NOTE — Telephone Encounter (Signed)
Called pt, left message regarding Bone Density @ [facility] on 12/11/11 0900am. Pt will draw lab on 01/30/12 and see MD on 11/4th afternoon appt.

## 2011-08-05 NOTE — Progress Notes (Signed)
Hematology and Oncology Follow Up Visit  Gwendolyn Williams [id number] 16-Feb-1941 71 y.o. 08/05/2011 7:34 PM   Principle Diagnosis: Encounter Diagnoses  Name Primary?  . Myeloproliferative disorder   . Thrombosis of superior mesenteric artery   . Breast cancer, stage 1, estrogen receptor positive Yes  . 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  She is on chronic Coumadin and low-dose aspirin.  She was diagnosed with a stage I estrogen and progesterone receptor positive HER-2 negative cancer of  the left breast in August 2010. The tumor had a high proliferation rate and a  high-risk Oncotype DX score of 33. She was treated with lumpectomy on 12/26/2008,  adjuvant chemotherapy, with 4 cycles of Taxotere Cytoxan at doses of 75 mg per meter squared and 600 mg per meter squared respectively between 02/16/2009 and 04/20/2009, radiation with 50.4 gray between February 14 and 07/02/2009, and is currently on hormonal therapy with Arimidex. She tested negative for the presence of breast cancer gene 1 and BRCA2  She is doing well at this time. She has had no interim medical problems.   Medications: reviewed  Allergies:  Allergies  Allergen Reactions  . Penicillins   . Sulfa Antibiotics     Unable to recall    Review of Systems: Constitutional:   No constitutional symptoms Respiratory: No cough; she continues to have mild dyspnea at rest Cardiovascular:  No chest pain or palpitations Gastrointestinal: No change in bowel habit. No hematochezia. Genito-Urinary: No urinary tract symptoms. Musculoskeletal: No muscle or bone pain. Neurologic: No headache or change in vision, no slurred speech, no focal weakness. Skin: No rash Remaining ROS negative.  Physical Exam: Blood pressure 134/79, pulse 67, temperature 97 F (36.1 C), temperature  source Oral, height 5' (1.524 m), weight 104 lb 14.4 oz (47.582 kg). Wt Readings from Last 3 Encounters:  08/04/11 104 lb 14.4 oz (47.582 kg)  01/06/11 103 lb (46.72 kg)     General appearance: Thin Caucasian woman [name]: Pharynx no erythema or exudate Lymph nodes: No cervical supraclavicular or axillary adenopathy Breasts: Surgical changes left breast, deviation of the nipple, no dominant mass in either breast Lungs: Clear to auscultation resonant to percussion Heart: Regular rhythm no murmur Abdomen: Soft nontender no mass no organomegaly Extremities: No edema no calf tenderness Vascular: No carotid bruits, no cyanosis Neurologic: Mental status intact, cranial nerves intact, pupils equal round reactive to light, optic discs sharp, motor strength 5 over 5, reflexes 1+ symmetric. Skin: No rash or ecchymoses  Lab Results: Lab Results  Component Value Date   WBC 7.6 08/04/2011   HGB 14.0 08/04/2011   HCT 43.4 08/04/2011   MCV 85.6 08/04/2011   PLT 406* 08/04/2011     Chemistry      Component Value Date/Time   NA 138 08/04/2011 1537   K 3.7 08/04/2011 1537   CL 103 08/04/2011 1537   CO2 24 08/04/2011 1537   BUN 16 08/04/2011 1537   CREATININE 0.88 08/04/2011 1537      Component Value Date/Time   CALCIUM 9.7 08/04/2011 1537   ALKPHOS 49 08/04/2011 1537   AST 17 08/04/2011 1537   ALT 16 08/04/2011 1537   BILITOT 0.4 08/04/2011 1537       Radiological Studies: Most recent mammogram done at [facility] 12/30/2010 shows postsurgical changes left breast but no signs of new disease.  Impression and Plan: 1. JAK2-positive myeloproliferative disorder.  Stable on low-dose aspirin and Coumadin. Continue the same. 2. Status post mesenteric vascular thrombosis presenting with acute onset abdominal pain and hematochezia as first sign of her myeloproliferative disorder  in January 2010. 3. Stage I ER-positive HER-2 negative cancer of the left breast diagnosed August 2010, treated with lumpectomy,  radiation, followed by 4 cycles of Cytoxan and Taxotere chemotherapy and currently on Arimidex hormonal therapy. Continue the Arimidex to complete 5 years of treatment. 4. Essential hypertension.      CC:. Dr. Blair Heys. Dr. Emelia Loron; Dr. Dorothy Puffer   Levert Feinstein, MD 5/7/20137:34 PM

## 2011-09-05 ENCOUNTER — Ambulatory Visit (HOSPITAL_BASED_OUTPATIENT_CLINIC_OR_DEPARTMENT_OTHER): Payer: Medicare Other | Admitting: Pharmacist

## 2011-09-05 ENCOUNTER — Other Ambulatory Visit (HOSPITAL_BASED_OUTPATIENT_CLINIC_OR_DEPARTMENT_OTHER): Payer: Medicare Other

## 2011-09-05 DIAGNOSIS — Z86718 Personal history of other venous thrombosis and embolism: Secondary | ICD-10-CM

## 2011-09-05 DIAGNOSIS — Z7901 Long term (current) use of anticoagulants: Secondary | ICD-10-CM

## 2011-09-05 DIAGNOSIS — K55069 Acute infarction of intestine, part and extent unspecified: Secondary | ICD-10-CM

## 2011-09-05 DIAGNOSIS — K55059 Acute (reversible) ischemia of intestine, part and extent unspecified: Secondary | ICD-10-CM

## 2011-09-05 LAB — PROTIME-INR
INR: 2.5 (ref 2.00–3.50)
Protime: 30 Seconds — ABNORMAL HIGH (ref 10.6–13.4)

## 2011-09-05 NOTE — Progress Notes (Signed)
INR therapeutic today (2.5).  No problems.  No changes.  Will continue current dose of 5mg  daily, and recheck INR in 1 month.

## 2011-10-06 ENCOUNTER — Other Ambulatory Visit (HOSPITAL_BASED_OUTPATIENT_CLINIC_OR_DEPARTMENT_OTHER): Payer: Medicare Other | Admitting: Lab

## 2011-10-06 ENCOUNTER — Ambulatory Visit (HOSPITAL_BASED_OUTPATIENT_CLINIC_OR_DEPARTMENT_OTHER): Payer: Medicare Other | Admitting: Pharmacist

## 2011-10-06 DIAGNOSIS — K55059 Acute (reversible) ischemia of intestine, part and extent unspecified: Secondary | ICD-10-CM

## 2011-10-06 DIAGNOSIS — K55069 Acute infarction of intestine, part and extent unspecified: Secondary | ICD-10-CM

## 2011-10-06 LAB — PROTIME-INR
INR: 2.2 (ref 2.00–3.50)
Protime: 26.4 Seconds — ABNORMAL HIGH (ref 10.6–13.4)

## 2011-10-06 LAB — POCT INR: INR: 2.2

## 2011-10-06 NOTE — Progress Notes (Signed)
INR = 2.2 on Coumadin 5 mg/day Pt takes "occasional" Aleve for neck pain.  Otherwise, no med changes. Traveling this coming week to [place] to [facility]. INR therapeutic. No change to dose. Return 1 month. I supplied her w/ 60 tabs of Coumadin 5 mg samples today. Marily Lente, Pharm.D.

## 2011-11-06 ENCOUNTER — Ambulatory Visit (HOSPITAL_BASED_OUTPATIENT_CLINIC_OR_DEPARTMENT_OTHER): Payer: Medicare Other | Admitting: Pharmacist

## 2011-11-06 ENCOUNTER — Other Ambulatory Visit (HOSPITAL_BASED_OUTPATIENT_CLINIC_OR_DEPARTMENT_OTHER): Payer: Medicare Other | Admitting: Lab

## 2011-11-06 DIAGNOSIS — K55059 Acute (reversible) ischemia of intestine, part and extent unspecified: Secondary | ICD-10-CM

## 2011-11-06 DIAGNOSIS — K55069 Acute infarction of intestine, part and extent unspecified: Secondary | ICD-10-CM

## 2011-11-06 LAB — PROTIME-INR
INR: 2.8 (ref 2.00–3.50)
Protime: 33.6 Seconds — ABNORMAL HIGH (ref 10.6–13.4)

## 2011-11-06 NOTE — Progress Notes (Signed)
INR therapeutic (2.8) on 5mg  daily. No changes.  No complaints.  Continue current dose.  Recheck INR in 5 weeks.

## 2011-12-12 ENCOUNTER — Ambulatory Visit (HOSPITAL_BASED_OUTPATIENT_CLINIC_OR_DEPARTMENT_OTHER): Payer: Medicare Other | Admitting: Pharmacist

## 2011-12-12 ENCOUNTER — Other Ambulatory Visit (HOSPITAL_BASED_OUTPATIENT_CLINIC_OR_DEPARTMENT_OTHER): Payer: Medicare Other

## 2011-12-12 DIAGNOSIS — K55059 Acute (reversible) ischemia of intestine, part and extent unspecified: Secondary | ICD-10-CM

## 2011-12-12 DIAGNOSIS — K55069 Acute infarction of intestine, part and extent unspecified: Secondary | ICD-10-CM

## 2011-12-12 LAB — PROTIME-INR
INR: 1.5 — ABNORMAL LOW (ref 2.00–3.50)
Protime: 18 Seconds — ABNORMAL HIGH (ref 10.6–13.4)

## 2011-12-12 LAB — POCT INR: INR: 1.5

## 2011-12-12 NOTE — Patient Instructions (Signed)
Take 7.5mg  today.  Resume 5mg  daily on 12/13/11.  Recheck INR in 1 week; 12/19/11 @ 11 am for lab and 11:15 am for coumadin clinic.

## 2011-12-12 NOTE — Progress Notes (Signed)
Unexplained decrease in INR. Pt had 3 pieces of broccoli last night. This is unlikely the cause of her decreased INR. No changes in medications, diet, etc.  No missed doses.  Pt is not experiencing any problems. No signs or symptoms of a clot. Pt has been stable on this dose of Coumadin for over a year.  The last time her INR was below goal was in March 2013.  At that time, pt took 7.5mg  that day and then resumed to 5mg  daily the next day. She has been stable on 5mg  daily since that time. (She has been on 5mg  daily since March 2012). She will take 7.5mg  today.  Resume 5mg  daily on 12/13/11.  Recheck INR in 1 week; 12/19/11 @ 11 am for lab and 11:15 am for coumadin clinic.

## 2011-12-18 ENCOUNTER — Ambulatory Visit (HOSPITAL_BASED_OUTPATIENT_CLINIC_OR_DEPARTMENT_OTHER): Payer: Medicare Other | Admitting: Pharmacist

## 2011-12-18 ENCOUNTER — Other Ambulatory Visit (HOSPITAL_BASED_OUTPATIENT_CLINIC_OR_DEPARTMENT_OTHER): Payer: Medicare Other | Admitting: Lab

## 2011-12-18 ENCOUNTER — Other Ambulatory Visit: Payer: Medicare Other | Admitting: Lab

## 2011-12-18 ENCOUNTER — Ambulatory Visit: Payer: Medicare Other

## 2011-12-18 DIAGNOSIS — K55059 Acute (reversible) ischemia of intestine, part and extent unspecified: Secondary | ICD-10-CM

## 2011-12-18 DIAGNOSIS — K55069 Acute infarction of intestine, part and extent unspecified: Secondary | ICD-10-CM

## 2011-12-18 DIAGNOSIS — Z7901 Long term (current) use of anticoagulants: Secondary | ICD-10-CM

## 2011-12-18 LAB — PROTIME-INR
INR: 1.6 — ABNORMAL LOW (ref 2.00–3.50)
Protime: 19.2 Seconds — ABNORMAL HIGH (ref 10.6–13.4)

## 2011-12-18 LAB — POCT INR: INR: 1.6

## 2011-12-18 MED ORDER — WARFARIN SODIUM 1 MG PO TABS: 1.0000 mg | ORAL_TABLET | ORAL | Status: DC

## 2011-12-18 NOTE — Patient Instructions (Addendum)
Increase Coumadin to 6mg  daily. Recheck INR in 1 week on 12/25/11.  Lab @ 3pm and Coumadin clinic @ 3:15pm.

## 2011-12-18 NOTE — Progress Notes (Signed)
INR unchanged despite slight dose increase last week. Will increase Coumadin to 6mg  daily. Recheck INR in 1 week on 12/25/11.  Lab @ 3pm and Coumadin clinic @ 3:15pm.  No Lovenox needed at this time. Discussed with Dr. Cyndie Chime.  Coumadin 1mg  prescription called to the [facility] on [address], Phone = [phone] Patient will take one 1mg  tablet daily + one 5mg  tablet daily to = 6mg  daily.

## 2011-12-19 ENCOUNTER — Other Ambulatory Visit: Payer: Medicare Other | Admitting: Lab

## 2011-12-19 ENCOUNTER — Ambulatory Visit: Payer: Medicare Other

## 2011-12-25 ENCOUNTER — Other Ambulatory Visit (HOSPITAL_BASED_OUTPATIENT_CLINIC_OR_DEPARTMENT_OTHER): Payer: Medicare Other | Admitting: Lab

## 2011-12-25 ENCOUNTER — Ambulatory Visit (HOSPITAL_BASED_OUTPATIENT_CLINIC_OR_DEPARTMENT_OTHER): Payer: Medicare Other | Admitting: Pharmacist

## 2011-12-25 DIAGNOSIS — K55059 Acute (reversible) ischemia of intestine, part and extent unspecified: Secondary | ICD-10-CM

## 2011-12-25 DIAGNOSIS — K55069 Acute infarction of intestine, part and extent unspecified: Secondary | ICD-10-CM

## 2011-12-25 LAB — PROTIME-INR
INR: 2.1 (ref 2.00–3.50)
Protime: 25.2 Seconds — ABNORMAL HIGH (ref 10.6–13.4)

## 2011-12-25 NOTE — Progress Notes (Signed)
INR therapeutic today (2.1) after increasing dose to 6mg  daily. No problems with bleeding or bruising.  Started back on Dyazide for her HTN (med list updated). No changes in diet.   Will have pt continue current dose, and recheck INR in 1 week.

## 2011-12-29 ENCOUNTER — Telehealth: Payer: Self-pay | Admitting: *Deleted

## 2011-12-29 NOTE — Telephone Encounter (Signed)
Patient called [id number] density results.  They are not currently scanned into system.  She has a mammogram Wednesday and will ask for copy of results then and will bring them with her to visit with Dr. Cyndie Chime in October.

## 2012-01-01 ENCOUNTER — Other Ambulatory Visit (HOSPITAL_BASED_OUTPATIENT_CLINIC_OR_DEPARTMENT_OTHER): Payer: Medicare Other

## 2012-01-01 ENCOUNTER — Ambulatory Visit (HOSPITAL_BASED_OUTPATIENT_CLINIC_OR_DEPARTMENT_OTHER): Payer: Medicare Other | Admitting: Pharmacist

## 2012-01-01 DIAGNOSIS — K55069 Acute infarction of intestine, part and extent unspecified: Secondary | ICD-10-CM

## 2012-01-01 DIAGNOSIS — K55059 Acute (reversible) ischemia of intestine, part and extent unspecified: Secondary | ICD-10-CM

## 2012-01-01 LAB — PROTIME-INR
INR: 2.5 (ref 2.00–3.50)
Protime: 30 Seconds — ABNORMAL HIGH (ref 10.6–13.4)

## 2012-01-01 LAB — POCT INR: INR: 2.5

## 2012-01-01 NOTE — Progress Notes (Signed)
INR stable on new Coumadin dose of 6mg  daily.  Will check PT/INR in 2 weeks per pt preference.

## 2012-01-16 ENCOUNTER — Other Ambulatory Visit (HOSPITAL_BASED_OUTPATIENT_CLINIC_OR_DEPARTMENT_OTHER): Payer: Medicare Other | Admitting: Lab

## 2012-01-16 ENCOUNTER — Ambulatory Visit (HOSPITAL_BASED_OUTPATIENT_CLINIC_OR_DEPARTMENT_OTHER): Payer: Medicare Other | Admitting: Pharmacist

## 2012-01-16 DIAGNOSIS — K55069 Acute infarction of intestine, part and extent unspecified: Secondary | ICD-10-CM

## 2012-01-16 DIAGNOSIS — K55059 Acute (reversible) ischemia of intestine, part and extent unspecified: Secondary | ICD-10-CM

## 2012-01-16 DIAGNOSIS — Z7901 Long term (current) use of anticoagulants: Secondary | ICD-10-CM

## 2012-01-16 LAB — PROTIME-INR
INR: 2.8 (ref 2.00–3.50)
Protime: 33.6 Seconds — ABNORMAL HIGH (ref 10.6–13.4)

## 2012-01-16 NOTE — Progress Notes (Signed)
INR therapeutic today (2.8) on 6mg  daily. No changes in meds or diet.  No missed doses.  No problems with bleeding or bruising. Will continue current dose, and recheck INR in 2 weeks with existing lab appt.

## 2012-01-30 ENCOUNTER — Other Ambulatory Visit (HOSPITAL_BASED_OUTPATIENT_CLINIC_OR_DEPARTMENT_OTHER): Payer: Medicare Other | Admitting: Lab

## 2012-01-30 ENCOUNTER — Ambulatory Visit (HOSPITAL_BASED_OUTPATIENT_CLINIC_OR_DEPARTMENT_OTHER): Payer: Medicare Other | Admitting: Pharmacist

## 2012-01-30 DIAGNOSIS — K55059 Acute (reversible) ischemia of intestine, part and extent unspecified: Secondary | ICD-10-CM

## 2012-01-30 DIAGNOSIS — K55069 Acute infarction of intestine, part and extent unspecified: Secondary | ICD-10-CM

## 2012-01-30 DIAGNOSIS — Z170 Estrogen receptor positive status [ER+]: Secondary | ICD-10-CM

## 2012-01-30 DIAGNOSIS — D47Z9 Other specified neoplasms of uncertain behavior of lymphoid, hematopoietic and related tissue: Secondary | ICD-10-CM

## 2012-01-30 DIAGNOSIS — C50919 Malignant neoplasm of unspecified site of unspecified female breast: Secondary | ICD-10-CM

## 2012-01-30 DIAGNOSIS — Z7901 Long term (current) use of anticoagulants: Secondary | ICD-10-CM

## 2012-01-30 DIAGNOSIS — D471 Chronic myeloproliferative disease: Secondary | ICD-10-CM

## 2012-01-30 LAB — COMPREHENSIVE METABOLIC PANEL (CC13)
ALT: 18 U/L (ref 0–55)
AST: 14 U/L (ref 5–34)
Albumin: 4 g/dL (ref 3.5–5.0)
Alkaline Phosphatase: 48 U/L (ref 40–150)
BUN: 24 mg/dL (ref 7.0–26.0)
CO2: 28 mEq/L (ref 22–29)
Calcium: 10 mg/dL (ref 8.4–10.4)
Chloride: 103 mEq/L (ref 98–107)
Creatinine: 1 mg/dL (ref 0.6–1.1)
Glucose: 72 mg/dl (ref 70–99)
Potassium: 3.6 mEq/L (ref 3.5–5.1)
Sodium: 139 mEq/L (ref 136–145)
Total Bilirubin: 0.58 mg/dL (ref 0.20–1.20)
Total Protein: 6.5 g/dL (ref 6.4–8.3)

## 2012-01-30 LAB — CBC WITH DIFFERENTIAL/PLATELET
BASO%: 1 % (ref 0.0–2.0)
Basophils Absolute: 0.1 10*3/uL (ref 0.0–0.1)
EOS%: 2.2 % (ref 0.0–7.0)
Eosinophils Absolute: 0.2 10*3/uL (ref 0.0–0.5)
HCT: 42.7 % (ref 34.8–46.6)
HGB: 14.2 g/dL (ref 11.6–15.9)
LYMPH%: 11 % — ABNORMAL LOW (ref 14.0–49.7)
MCH: 28.1 pg (ref 25.1–34.0)
MCHC: 33.2 g/dL (ref 31.5–36.0)
MCV: 84.6 fL (ref 79.5–101.0)
MONO#: 0.6 10*3/uL (ref 0.1–0.9)
MONO%: 7.2 % (ref 0.0–14.0)
NEUT#: 6.3 10*3/uL (ref 1.5–6.5)
NEUT%: 78.6 % — ABNORMAL HIGH (ref 38.4–76.8)
Platelets: 416 10*3/uL — ABNORMAL HIGH (ref 145–400)
RBC: 5.05 10*6/uL (ref 3.70–5.45)
RDW: 14.5 % (ref 11.2–14.5)
WBC: 8.1 10*3/uL (ref 3.9–10.3)
lymph#: 0.9 10*3/uL (ref 0.9–3.3)

## 2012-01-30 LAB — PROTIME-INR
INR: 3.8 — ABNORMAL HIGH (ref 2.00–3.50)
Protime: 45.6 Seconds — ABNORMAL HIGH (ref 10.6–13.4)

## 2012-01-30 LAB — LACTATE DEHYDROGENASE (CC13): LDH: 270 U/L — ABNORMAL HIGH (ref 125–220)

## 2012-01-30 LAB — POCT INR: INR: 3.8

## 2012-01-30 NOTE — Progress Notes (Signed)
INR = 3.8 on 6 mg/day Pt had a glass of wine last night but shouldn't have effected INR. 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  No bleeding/bruising. INR has slowly been rising on 6 mg/day.  We are now supratherapeutic. Pt was on 5 mg/day & suddenly had an unexplainable drop in INR.   I will go in the middle of these doses & have her take 6 mg/day; 5 mg MWF. Return in 10 days. Marily Lente, Pharm.D.

## 2012-02-02 ENCOUNTER — Ambulatory Visit (HOSPITAL_BASED_OUTPATIENT_CLINIC_OR_DEPARTMENT_OTHER): Payer: Medicare Other | Admitting: Oncology

## 2012-02-02 ENCOUNTER — Encounter: Payer: Self-pay | Admitting: Oncology

## 2012-02-02 VITALS — BP 123/81 | HR 73 | Temp 97.5°F | Resp 20 | Ht 60.0 in | Wt 101.3 lb

## 2012-02-02 DIAGNOSIS — D471 Chronic myeloproliferative disease: Secondary | ICD-10-CM

## 2012-02-02 DIAGNOSIS — K55069 Acute infarction of intestine, part and extent unspecified: Secondary | ICD-10-CM

## 2012-02-02 DIAGNOSIS — M818 Other osteoporosis without current pathological fracture: Secondary | ICD-10-CM | POA: Insufficient documentation

## 2012-02-02 DIAGNOSIS — Z853 Personal history of malignant neoplasm of breast: Secondary | ICD-10-CM

## 2012-02-02 DIAGNOSIS — C50919 Malignant neoplasm of unspecified site of unspecified female breast: Secondary | ICD-10-CM

## 2012-02-02 DIAGNOSIS — D47Z9 Other specified neoplasms of uncertain behavior of lymphoid, hematopoietic and related tissue: Secondary | ICD-10-CM

## 2012-02-02 DIAGNOSIS — Z86718 Personal history of other venous thrombosis and embolism: Secondary | ICD-10-CM

## 2012-02-02 DIAGNOSIS — M810 Age-related osteoporosis without current pathological fracture: Secondary | ICD-10-CM

## 2012-02-02 HISTORY — DX: Other osteoporosis without current pathological fracture: M81.8

## 2012-02-02 NOTE — Progress Notes (Signed)
Hematology and Oncology Follow Up Visit  Gwendolyn Williams [id number] 08-16-1940 71 y.o. 02/02/2012 6:30 PM   Principle Diagnosis: Encounter Diagnoses  Name Primary?  . History of breast cancer in female   . Mesenteric thrombosis   . Myeloproliferative disorder   . 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 She is on chronic Coumadin and low-dose aspirin. She was diagnosed with a stage I estrogen and progesterone receptor positive HER-2 negative cancer of the left breast in August 2010. The tumor had a high proliferation rate and a high-risk Oncotype DX score of 33. She was treated with lumpectomy on 12/26/2008, adjuvant chemotherapy, with 4 cycles of Taxotere Cytoxan at doses of 75 mg per meter squared and 600 mg per meter squared respectively between 02/16/2009 and 04/20/2009, radiation with 50.4 gray between February 14 and 07/02/2009, and is currently on hormonal therapy with Arimidex. She tested negative for the presence of BRCA1 and BRCA2.  She is doing well at this time. She has had no interim medical problems. She had a mammogram on September 18. This was done at [facility] but report not scanned into our system yet. She received a letter that said that the study was normal except for dense breast tissue.  She had a followup bone density study and compared with the study done in 2011 there has been decrease in bone density now at the osteoporosis level. She is on calcium and vitamin D supplements. Vitamin D level was normal but last checked here 2 years ago. Calcium was borderline high at 10 so increasing calcium is not an option.   Medications: reviewed  Allergies:  Allergies  Allergen Reactions  . Penicillins   . Sulfa Antibiotics     Unable to recall    Review of Systems: Constitutional:  No constitutional  symptoms  Respiratory: No cough or dyspnea Cardiovascular:  No chest pain or palpitations Gastrointestinal: No abdominal pain or cramps, no hematochezia or melena Genito-Urinary: Not questioned Musculoskeletal: She has some mild polyarthralgia Neurologic: No headache or change in vision Skin: No rash Remaining ROS negative.  Physical Exam: Blood pressure 123/81, pulse 73, temperature 97.5 F (36.4 C), temperature source Oral, resp. rate 20, height 5' (1.524 m), weight 101 lb 4.8 oz (45.949 kg). Wt Readings from Last 3 Encounters:  02/02/12 101 lb 4.8 oz (45.949 kg)  08/04/11 104 lb 14.4 oz (47.582 kg)  01/06/11 103 lb (46.72 kg)     General appearance: Thin Caucasian woman [name]: Pharynx no erythema or exudate Lymph nodes: No cervical, supraclavicular, or axillary adenopathy Breasts: Surgical changes in the left breast with significant deformity. No dominant mass in either breast. Lungs: Clear to auscultation resonant to percussion Heart: Regular rhythm no murmur Abdomen: Soft, nontender, no mass, no organomegaly Extremities: No edema, no calf tenderness Vascular: No cyanosis Neurologic: Mental status intact, PERRLA, optic disc sharp, motor strength 5 over 5, reflexes 2+ symmetric, Skin: No rash or ecchymosis  Lab Results: Lab Results  Component Value Date   WBC 8.1 01/30/2012   HGB 14.2 01/30/2012   HCT 42.7 01/30/2012   MCV 84.6 01/30/2012   PLT 416* 01/30/2012     Chemistry      Component Value Date/Time   NA 139 01/30/2012 1101   NA 138 08/04/2011 1537   K 3.6 01/30/2012 1101   K 3.7 08/04/2011 1537  CL 103 01/30/2012 1101   CL 103 08/04/2011 1537   CO2 28 01/30/2012 1101   CO2 24 08/04/2011 1537   BUN 24.0 01/30/2012 1101   BUN 16 08/04/2011 1537   CREATININE 1.0 01/30/2012 1101   CREATININE 0.88 08/04/2011 1537      Component Value Date/Time   CALCIUM 10.0 01/30/2012 1101   CALCIUM 9.7 08/04/2011 1537   ALKPHOS 48 01/30/2012 1101   ALKPHOS 49 08/04/2011 1537   AST 14 01/30/2012  1101   AST 17 08/04/2011 1537   ALT 18 01/30/2012 1101   ALT 16 08/04/2011 1537   BILITOT 0.58 01/30/2012 1101   BILITOT 0.4 08/04/2011 1537    INR 3.8 today and Coumadin dose decreased to 5 mg Monday Wednesday Friday with 6 mg on the other days of the week.   Radiological Studies: See discussion above .  Impression and Plan:  1. JAK2-positive myeloproliferative disorder. Stable on low-dose aspirin and Coumadin. Continue the same. 2. Status post mesenteric vascular thrombosis presenting with acute onset abdominal pain and hematochezia as first sign of her myeloproliferative disorder in January 2010. 3. Stage I ER-positive HER-2 negative cancer of the left breast diagnosed August 2010, treated with lumpectomy, radiation, followed by 4 cycles of Cytoxan and Taxotere chemotherapy and currently on Arimidex hormonal therapy. Continue the Arimidex to complete 5 years of treatment. 4. Essential hypertension.  5. Osteoporosis.  We discussed risk versus benefit of a bisphosphonate. She did take Fosamax in the past. I offered a alternative in the form of parenteral Zometa infusions to be given at 6 month intervals. We discussed potential side effects including polyarthralgias and the rare complication of osteonecrosis of the jaw. She is advised that as long as her teeth are in good repair and she sees a dentist on a regular basis that this latter complication is quite rare. did go ahead and schedule her for her first dose next week. 6. Essential hypertension controlled on medication 7. Hyperlipidemia controlled on medication 8. GERD   CC:. Dr. Blair Heys; Teodora Medici; Emelia Loron; Dorothy Puffer   Levert Feinstein, MD 11/4/20136:30 PM

## 2012-02-03 ENCOUNTER — Telehealth: Payer: Self-pay | Admitting: *Deleted

## 2012-02-03 NOTE — Telephone Encounter (Signed)
Pt's husband who is a pharmacist would like to talk to Dr. Cyndie Chime about zometa & reason of choice over an oral drug.  He can be reached at [phone].  Note to Dr Cyndie Chime.

## 2012-02-05 ENCOUNTER — Telehealth: Payer: Self-pay | Admitting: Oncology

## 2012-02-05 NOTE — Telephone Encounter (Signed)
Called patient and left message regarding Zometa on 02/10/12, advised patient to get appt calendar when she gets here next week

## 2012-02-09 ENCOUNTER — Other Ambulatory Visit (HOSPITAL_BASED_OUTPATIENT_CLINIC_OR_DEPARTMENT_OTHER): Payer: Medicare Other

## 2012-02-09 ENCOUNTER — Telehealth: Payer: Self-pay | Admitting: Oncology

## 2012-02-09 ENCOUNTER — Ambulatory Visit (HOSPITAL_BASED_OUTPATIENT_CLINIC_OR_DEPARTMENT_OTHER): Payer: Medicare Other | Admitting: Pharmacist

## 2012-02-09 DIAGNOSIS — K55069 Acute infarction of intestine, part and extent unspecified: Secondary | ICD-10-CM

## 2012-02-09 DIAGNOSIS — K55059 Acute (reversible) ischemia of intestine, part and extent unspecified: Secondary | ICD-10-CM

## 2012-02-09 LAB — POCT INR: INR: 2.3

## 2012-02-09 LAB — PROTIME-INR
INR: 2.3 (ref 2.00–3.50)
Protime: 27.6 Seconds — ABNORMAL HIGH (ref 10.6–13.4)

## 2012-02-09 NOTE — Progress Notes (Signed)
No changes to report.  Pt states she is cancelling her infusion appmt for tomorrow for zometa as Dr. Lorenso Courier does not agree with osteoporosis diagnosis and he does not want her to get zometa. Continue coumadin 6mg  daily except 5 mg on MWF.   Return to Coumadin clinic on 02/23/12 at 2:30 pm for lab and 2:45 pm for Coumadin clinic.

## 2012-02-09 NOTE — Telephone Encounter (Signed)
Pt came in and stated that she would like to cx her zometa for tomorrow.  States that Dr Chevis Pretty should be talking to Dr Cyndie Chime.

## 2012-02-09 NOTE — Patient Instructions (Signed)
Continue coumadin 6mg  daily except 5 mg on MWF.   Return to Coumadin clinic on 02/23/12 at 2:30 pm for lab and 2:45 pm for Coumadin clinic.

## 2012-02-10 ENCOUNTER — Ambulatory Visit: Payer: Medicare Other

## 2012-02-23 ENCOUNTER — Other Ambulatory Visit (HOSPITAL_BASED_OUTPATIENT_CLINIC_OR_DEPARTMENT_OTHER): Payer: Medicare Other | Admitting: Lab

## 2012-02-23 ENCOUNTER — Ambulatory Visit (HOSPITAL_BASED_OUTPATIENT_CLINIC_OR_DEPARTMENT_OTHER): Payer: Medicare Other | Admitting: Pharmacist

## 2012-02-23 DIAGNOSIS — K55059 Acute (reversible) ischemia of intestine, part and extent unspecified: Secondary | ICD-10-CM

## 2012-02-23 DIAGNOSIS — K55069 Acute infarction of intestine, part and extent unspecified: Secondary | ICD-10-CM

## 2012-02-23 LAB — PROTIME-INR
INR: 1.9 — ABNORMAL LOW (ref 2.00–3.50)
Protime: 22.8 Seconds — ABNORMAL HIGH (ref 10.6–13.4)

## 2012-02-23 LAB — POCT INR: INR: 1.9

## 2012-02-23 NOTE — Progress Notes (Signed)
Pt's INR slightly subtherapeutic at 1.9 today. Pt was therapeutic on this same regimen last time (5mg  MWF, 6mg  all other days) with an INR of 2.3. She states that there hasn't been any diet or medication changes to her regimen. I have increased the Monday dose to 6mg . Pt will now be taking 5mg  on WF and 6mg  all other days. Will follow up in Coumadin clinic in 11 days on Friday, Dec 06.  Thank you, Franchot Erichsen, Pharm.D. Clinical Pharmacist   02/23/2012 3:00 PM

## 2012-03-05 ENCOUNTER — Other Ambulatory Visit: Payer: Medicare Other | Admitting: Lab

## 2012-03-05 ENCOUNTER — Ambulatory Visit: Payer: Medicare Other | Admitting: Pharmacist

## 2012-03-05 DIAGNOSIS — Z5181 Encounter for therapeutic drug level monitoring: Secondary | ICD-10-CM

## 2012-03-05 DIAGNOSIS — K55069 Acute infarction of intestine, part and extent unspecified: Secondary | ICD-10-CM

## 2012-03-05 LAB — POCT INR: INR: 2.5

## 2012-03-05 LAB — PROTIME-INR
INR: 2.5 (ref 2.00–3.50)
Protime: 30 Seconds — ABNORMAL HIGH (ref 10.6–13.4)

## 2012-03-05 NOTE — Progress Notes (Signed)
INR = 2.5 on 6 mg/day; 5 mg W/F Only change is that she has increased her Iron supplement to 300 mg/day.  She noticed at the lower dose of Iron, she became more SOB.  She is much better now. INR at goal. No change to dose. Return in 3 weeks. Marily Lente, Pharm.D.

## 2012-03-23 ENCOUNTER — Other Ambulatory Visit: Payer: Self-pay | Admitting: *Deleted

## 2012-03-23 DIAGNOSIS — C50919 Malignant neoplasm of unspecified site of unspecified female breast: Secondary | ICD-10-CM

## 2012-03-23 DIAGNOSIS — Z853 Personal history of malignant neoplasm of breast: Secondary | ICD-10-CM

## 2012-03-23 MED ORDER — ANASTROZOLE 1 MG PO TABS: 1.0000 mg | ORAL_TABLET | Freq: Every day | ORAL | Status: DC

## 2012-03-26 ENCOUNTER — Other Ambulatory Visit (HOSPITAL_BASED_OUTPATIENT_CLINIC_OR_DEPARTMENT_OTHER): Payer: Medicare Other | Admitting: Lab

## 2012-03-26 ENCOUNTER — Ambulatory Visit (HOSPITAL_BASED_OUTPATIENT_CLINIC_OR_DEPARTMENT_OTHER): Payer: Medicare Other | Admitting: Pharmacist

## 2012-03-26 DIAGNOSIS — K55059 Acute (reversible) ischemia of intestine, part and extent unspecified: Secondary | ICD-10-CM

## 2012-03-26 LAB — PROTIME-INR
INR: 1.9 — ABNORMAL LOW (ref 2.00–3.50)
Protime: 22.8 Seconds — ABNORMAL HIGH (ref 10.6–13.4)

## 2012-03-26 NOTE — Progress Notes (Signed)
INR slightly subtherapeutic today (1.9) on 6mg  daily except 5mg  on WF Pt did have increased Vit K intake with her serving of broccoli last night.   No changes in meds.  No complaints.  Will have pt take 6mg  today, then resume usual dose of 6mg  daily except 5mg  on WF. Recheck INR in 2 weeks

## 2012-04-09 ENCOUNTER — Ambulatory Visit (HOSPITAL_BASED_OUTPATIENT_CLINIC_OR_DEPARTMENT_OTHER): Payer: Medicare Other | Admitting: Pharmacist

## 2012-04-09 ENCOUNTER — Other Ambulatory Visit (HOSPITAL_BASED_OUTPATIENT_CLINIC_OR_DEPARTMENT_OTHER): Payer: Medicare Other | Admitting: Lab

## 2012-04-09 DIAGNOSIS — K55059 Acute (reversible) ischemia of intestine, part and extent unspecified: Secondary | ICD-10-CM

## 2012-04-09 DIAGNOSIS — K55069 Acute infarction of intestine, part and extent unspecified: Secondary | ICD-10-CM

## 2012-04-09 DIAGNOSIS — Z7901 Long term (current) use of anticoagulants: Secondary | ICD-10-CM

## 2012-04-09 LAB — PROTIME-INR
INR: 2.1 (ref 2.00–3.50)
Protime: 25.2 s — ABNORMAL HIGH (ref 10.6–13.4)

## 2012-04-09 LAB — POCT INR: INR: 2.1

## 2012-04-09 NOTE — Patient Instructions (Signed)
Take 6mg  today, then resume 6mg  daily except 5mg  on Wed. Recheck INR in 3 weeks to ensure we stay in therapeutic range.  04/30/12 2:30 lab and 2:45 CC

## 2012-04-09 NOTE — Progress Notes (Signed)
Pt returned to therapeutic range today, but states she will feel more comfortable in the higher normal range than lower.  Request to only do one day a week at 5 mg and 6mg  other days of the week.  I agreed, she will take 6mg  today, then resume 6mg  daily except 5mg  on Wed. Recheck INR in 3 weeks (her request over 4 weeks) to ensure we stay in therapeutic range.  04/30/12 2:30 lab and 2:45 CC

## 2012-04-30 ENCOUNTER — Ambulatory Visit (HOSPITAL_BASED_OUTPATIENT_CLINIC_OR_DEPARTMENT_OTHER): Payer: Medicare Other | Admitting: Pharmacist

## 2012-04-30 ENCOUNTER — Other Ambulatory Visit: Payer: Medicare Other | Admitting: Lab

## 2012-04-30 DIAGNOSIS — K55069 Acute infarction of intestine, part and extent unspecified: Secondary | ICD-10-CM

## 2012-04-30 DIAGNOSIS — K55059 Acute (reversible) ischemia of intestine, part and extent unspecified: Secondary | ICD-10-CM

## 2012-04-30 LAB — PROTIME-INR
INR: 3.1 (ref 2.00–3.50)
Protime: 37.2 Seconds — ABNORMAL HIGH (ref 10.6–13.4)

## 2012-04-30 LAB — POCT INR: INR: 3.1

## 2012-04-30 NOTE — Patient Instructions (Signed)
Continue taking 6mg  daily except 5mg  on Wednesdays.  Recheck INR in 1 week on 05/07/12; lab at 3:45pm and coumadin clinic at 4pm.

## 2012-04-30 NOTE — Progress Notes (Signed)
INR slightly above goal. Pt prefers INR to be on the thinner side. No changes in diet, medications, etc. No problems to report. Continue taking 6mg  daily except 5mg  on Wednesdays.  Recheck INR in 1 week to evaluate INR trend on 05/07/12; lab at 3:45pm and coumadin clinic at 4pm.

## 2012-05-07 ENCOUNTER — Other Ambulatory Visit (HOSPITAL_BASED_OUTPATIENT_CLINIC_OR_DEPARTMENT_OTHER): Payer: Medicare Other | Admitting: Lab

## 2012-05-07 ENCOUNTER — Ambulatory Visit (HOSPITAL_BASED_OUTPATIENT_CLINIC_OR_DEPARTMENT_OTHER): Payer: Medicare Other | Admitting: Pharmacist

## 2012-05-07 DIAGNOSIS — K55069 Acute infarction of intestine, part and extent unspecified: Secondary | ICD-10-CM

## 2012-05-07 DIAGNOSIS — Z86718 Personal history of other venous thrombosis and embolism: Secondary | ICD-10-CM

## 2012-05-07 DIAGNOSIS — K55059 Acute (reversible) ischemia of intestine, part and extent unspecified: Secondary | ICD-10-CM

## 2012-05-07 DIAGNOSIS — Z7901 Long term (current) use of anticoagulants: Secondary | ICD-10-CM

## 2012-05-07 LAB — PROTIME-INR
INR: 2.5 (ref 2.00–3.50)
Protime: 30 Seconds — ABNORMAL HIGH (ref 10.6–13.4)

## 2012-05-07 LAB — POCT INR: INR: 2.5

## 2012-05-07 NOTE — Progress Notes (Signed)
INR = 2.5 on 6 mg/day; 5 mg Wed No complaints re: anticoag. INR at goal.  Cont same dose. Return in 3 weeks.

## 2012-05-21 ENCOUNTER — Ambulatory Visit (HOSPITAL_BASED_OUTPATIENT_CLINIC_OR_DEPARTMENT_OTHER): Payer: Medicare Other | Admitting: Pharmacist

## 2012-05-21 ENCOUNTER — Other Ambulatory Visit: Payer: Medicare Other | Admitting: Lab

## 2012-05-21 ENCOUNTER — Other Ambulatory Visit: Payer: Self-pay | Admitting: Pharmacist

## 2012-05-21 ENCOUNTER — Telehealth: Payer: Self-pay | Admitting: Pharmacist

## 2012-05-21 DIAGNOSIS — I82409 Acute embolism and thrombosis of unspecified deep veins of unspecified lower extremity: Secondary | ICD-10-CM

## 2012-05-21 DIAGNOSIS — K55059 Acute (reversible) ischemia of intestine, part and extent unspecified: Secondary | ICD-10-CM

## 2012-05-21 DIAGNOSIS — K55069 Acute infarction of intestine, part and extent unspecified: Secondary | ICD-10-CM

## 2012-05-21 DIAGNOSIS — Z86718 Personal history of other venous thrombosis and embolism: Secondary | ICD-10-CM

## 2012-05-21 LAB — PROTIME-INR
INR: 2.3 (ref 2.00–3.50)
Protime: 27.6 Seconds — ABNORMAL HIGH (ref 10.6–13.4)

## 2012-05-21 LAB — POCT INR: INR: 2.3

## 2012-05-21 NOTE — Telephone Encounter (Signed)
Pt called to see if she could be worked in for lab and CC today. She noticed abnormal bleeding on her arm when she casually bumped it getting out of her car.  She is worried that her INR could be too high, and isn't scheduled for another INR until next Friday.  After speaking with schedulers, I have been able to work her in for 2:30 PM today for lab, and CC afterward.  Pt communicated understanding.

## 2012-05-21 NOTE — Progress Notes (Signed)
Pt came in today for unscheduled INR check/clinic visit. Pt is happy to know that her INR is within goal. She wanted to make sure that her INR wasn't supratherapeutic. She casually bumped her arm on the car yesterday as she was getting out of the car. She does have a bruise about the size of a large avocado on her left forearm. It is tender to touch and slightly swollen. It has started to turn purple, yellowish and greenish colors. She thinks the area is slightly smaller than last night.  She will continue to monitor and will let us know if it becomes painful, larger or turns more red. Continue taking 6mg  daily except 5mg  on Wednesdays.  Recheck INR in 3 weeks on 06/11/12; lab at 2:30pm and coumadin clinic at 2:45pm. Pt is comfortable with plan.

## 2012-05-21 NOTE — Patient Instructions (Signed)
Continue taking 6mg  daily except 5mg  on Wednesdays.  Recheck INR in 3 weeks on 06/11/12; lab at 2:30pm and coumadin clinic at 2:45pm.

## 2012-05-28 ENCOUNTER — Ambulatory Visit: Payer: Medicare Other

## 2012-05-28 ENCOUNTER — Other Ambulatory Visit: Payer: Medicare Other | Admitting: Lab

## 2012-06-09 ENCOUNTER — Other Ambulatory Visit (HOSPITAL_BASED_OUTPATIENT_CLINIC_OR_DEPARTMENT_OTHER): Payer: Medicare Other | Admitting: Lab

## 2012-06-09 ENCOUNTER — Ambulatory Visit (HOSPITAL_BASED_OUTPATIENT_CLINIC_OR_DEPARTMENT_OTHER): Payer: Medicare Other | Admitting: Pharmacist

## 2012-06-09 DIAGNOSIS — K55059 Acute (reversible) ischemia of intestine, part and extent unspecified: Secondary | ICD-10-CM

## 2012-06-09 DIAGNOSIS — K55069 Acute infarction of intestine, part and extent unspecified: Secondary | ICD-10-CM

## 2012-06-09 DIAGNOSIS — I82409 Acute embolism and thrombosis of unspecified deep veins of unspecified lower extremity: Secondary | ICD-10-CM

## 2012-06-09 LAB — PROTIME-INR
INR: 3.9 — ABNORMAL HIGH (ref 2.00–3.50)
Protime: 46.8 Seconds — ABNORMAL HIGH (ref 10.6–13.4)

## 2012-06-09 LAB — POCT INR: INR: 3.9

## 2012-06-09 NOTE — Progress Notes (Signed)
INR = 3.9 on Coumadin 6 mg/day; 5 mg Wed Pt has no idea why her INR is a little high today. Small bruise L shin that is resolving. We will hold today's dose (5 mg) only then go back to previous dose since she was therapeutic on current dose. Return 3/21 for protime. [name], Pharm.D., CPP 06/09/2012@4 :19 PM

## 2012-06-11 ENCOUNTER — Ambulatory Visit: Payer: Medicare Other

## 2012-06-11 ENCOUNTER — Other Ambulatory Visit: Payer: Medicare Other | Admitting: Lab

## 2012-06-18 ENCOUNTER — Other Ambulatory Visit: Payer: Medicare Other | Admitting: Lab

## 2012-06-18 ENCOUNTER — Ambulatory Visit (HOSPITAL_BASED_OUTPATIENT_CLINIC_OR_DEPARTMENT_OTHER): Payer: Medicare Other | Admitting: Pharmacist

## 2012-06-18 DIAGNOSIS — I82409 Acute embolism and thrombosis of unspecified deep veins of unspecified lower extremity: Secondary | ICD-10-CM

## 2012-06-18 DIAGNOSIS — K55059 Acute (reversible) ischemia of intestine, part and extent unspecified: Secondary | ICD-10-CM

## 2012-06-18 DIAGNOSIS — K55069 Acute infarction of intestine, part and extent unspecified: Secondary | ICD-10-CM

## 2012-06-18 LAB — PROTIME-INR
INR: 1.7 — ABNORMAL LOW (ref 2.00–3.50)
Protime: 20.4 Seconds — ABNORMAL HIGH (ref 10.6–13.4)

## 2012-06-18 LAB — POCT INR: INR: 1.7

## 2012-06-18 NOTE — Patient Instructions (Addendum)
Continue taking 6mg  daily except 5mg  on Wednesdays.  Recheck INR on 06/24/12; lab at 3:15pm and coumadin clinic at 3:30pm.

## 2012-06-18 NOTE — Progress Notes (Signed)
INR below goal today. No problems regarding anticoagulation to report. No s/s of clotting. Pt held her dose as instructed on 06/09/12 due to elevated INR. Pt believes she fell asleep without taking her coumadin on 06/15/12. She found her tablet on the floor (near where she takes her medications at night) the next day. She then took 6mg  on 06/16/12 instead of 5mg  (since it was a Wednesday). Subtherapeutic INR is likely due to missing an additional dose of coumadin on 06/16/12. (She missed a total of 2 doses of coumadin in 2 weeks.) Continue taking 6mg  daily except 5mg  on Wednesdays.  Recheck INR on 06/24/12; lab at 3:15pm and coumadin clinic at 3:30pm.

## 2012-06-24 ENCOUNTER — Ambulatory Visit (HOSPITAL_BASED_OUTPATIENT_CLINIC_OR_DEPARTMENT_OTHER): Payer: Medicare Other | Admitting: Pharmacist

## 2012-06-24 ENCOUNTER — Other Ambulatory Visit (HOSPITAL_BASED_OUTPATIENT_CLINIC_OR_DEPARTMENT_OTHER): Payer: Medicare Other | Admitting: Lab

## 2012-06-24 DIAGNOSIS — Z5181 Encounter for therapeutic drug level monitoring: Secondary | ICD-10-CM

## 2012-06-24 DIAGNOSIS — K55059 Acute (reversible) ischemia of intestine, part and extent unspecified: Secondary | ICD-10-CM

## 2012-06-24 DIAGNOSIS — K55069 Acute infarction of intestine, part and extent unspecified: Secondary | ICD-10-CM

## 2012-06-24 DIAGNOSIS — I82409 Acute embolism and thrombosis of unspecified deep veins of unspecified lower extremity: Secondary | ICD-10-CM

## 2012-06-24 DIAGNOSIS — Z7901 Long term (current) use of anticoagulants: Secondary | ICD-10-CM

## 2012-06-24 LAB — POCT INR: INR: 3.4

## 2012-06-24 LAB — PROTIME-INR
INR: 3.4 (ref 2.00–3.50)
Protime: 40.8 Seconds — ABNORMAL HIGH (ref 10.6–13.4)

## 2012-06-24 NOTE — Patient Instructions (Signed)
Continue 5mg  today and on Wednesdays, then 6 mg daily.  Recheck INR on 07/02/12; lab at 2:30pm and coumadin clinic at 2:45pm

## 2012-06-24 NOTE — Progress Notes (Signed)
Pt supratherapeutic at 3.4 on 6mg  daily with 5 mg on Wed. We agreed to change 5 mg on Thur as well. She request to recheck her INR on next Fri.   Pt request Fri appmts. New dose is 5 mg on Wed and Thur and 6 mg other days. RTC on 07/02/12 at 2:30.

## 2012-07-02 ENCOUNTER — Ambulatory Visit (HOSPITAL_BASED_OUTPATIENT_CLINIC_OR_DEPARTMENT_OTHER): Payer: Medicare Other | Admitting: Pharmacist

## 2012-07-02 ENCOUNTER — Other Ambulatory Visit: Payer: Medicare Other | Admitting: Lab

## 2012-07-02 DIAGNOSIS — I82409 Acute embolism and thrombosis of unspecified deep veins of unspecified lower extremity: Secondary | ICD-10-CM

## 2012-07-02 DIAGNOSIS — K55069 Acute infarction of intestine, part and extent unspecified: Secondary | ICD-10-CM

## 2012-07-02 DIAGNOSIS — K55059 Acute (reversible) ischemia of intestine, part and extent unspecified: Secondary | ICD-10-CM

## 2012-07-02 LAB — PROTIME-INR
INR: 3.3 (ref 2.00–3.50)
Protime: 39.6 Seconds — ABNORMAL HIGH (ref 10.6–13.4)

## 2012-07-02 LAB — POCT INR: INR: 3.3

## 2012-07-02 NOTE — Patient Instructions (Signed)
Slightly decrease coumadin to 6mg  daily except 5mg  on MWF. Recheck INR on 07/16/12; lab at 11am and coumadin clinic at 11:15am.

## 2012-07-02 NOTE — Progress Notes (Signed)
INR still slightly above goal. Pt took 6mg  daily except 5mg  on Wed and Thu the week of 06/25/12. She resumed 6mg  daily except 5mg  on Wed for the past week. No problems regarding anticoagulation to report. Pt didn't take her Iron for a few days when her stomach wasn't feeling "the best". No other changes to report. Will slightly decrease coumadin to 6mg  daily except 5mg  on MWF. Recheck INR on 07/16/12; lab at 11am and coumadin clinic at 11:15am. Pt knows to call us sooner if she has any problems or questions before then.

## 2012-07-09 ENCOUNTER — Telehealth: Payer: Self-pay | Admitting: *Deleted

## 2012-07-09 NOTE — Telephone Encounter (Signed)
Pt left vm stating that she fell & has a goose egg on her arm & has been putting ice on it off & on.  She reports having an appt with her GP @ 4:30pm & she is concerned that she is on coumadin & wonders if she needs to come in for a PT/INR.  Discussed with Lars Pinks D & will let GP handle this.  Called & spoke to pt's husband & they were in the GP office & will address with the GP.

## 2012-07-13 ENCOUNTER — Other Ambulatory Visit: Payer: Self-pay | Admitting: Pharmacist

## 2012-07-13 ENCOUNTER — Other Ambulatory Visit: Payer: Self-pay | Admitting: *Deleted

## 2012-07-13 ENCOUNTER — Other Ambulatory Visit: Payer: Self-pay | Admitting: Oncology

## 2012-07-13 ENCOUNTER — Telehealth: Payer: Self-pay | Admitting: *Deleted

## 2012-07-13 ENCOUNTER — Ambulatory Visit (HOSPITAL_BASED_OUTPATIENT_CLINIC_OR_DEPARTMENT_OTHER): Payer: Worker's Compensation | Admitting: Lab

## 2012-07-13 ENCOUNTER — Ambulatory Visit (HOSPITAL_BASED_OUTPATIENT_CLINIC_OR_DEPARTMENT_OTHER): Payer: Worker's Compensation | Admitting: Pharmacist

## 2012-07-13 DIAGNOSIS — Z7901 Long term (current) use of anticoagulants: Secondary | ICD-10-CM

## 2012-07-13 DIAGNOSIS — K55069 Acute infarction of intestine, part and extent unspecified: Secondary | ICD-10-CM

## 2012-07-13 DIAGNOSIS — I82409 Acute embolism and thrombosis of unspecified deep veins of unspecified lower extremity: Secondary | ICD-10-CM

## 2012-07-13 DIAGNOSIS — D471 Chronic myeloproliferative disease: Secondary | ICD-10-CM

## 2012-07-13 DIAGNOSIS — K55059 Acute (reversible) ischemia of intestine, part and extent unspecified: Secondary | ICD-10-CM

## 2012-07-13 LAB — CBC WITH DIFFERENTIAL/PLATELET
BASO%: 0.9 % (ref 0.0–2.0)
Basophils Absolute: 0.1 10*3/uL (ref 0.0–0.1)
EOS%: 1.9 % (ref 0.0–7.0)
Eosinophils Absolute: 0.2 10*3/uL (ref 0.0–0.5)
HCT: 41.1 % (ref 34.8–46.6)
HGB: 13.8 g/dL (ref 11.6–15.9)
LYMPH%: 13.4 % — ABNORMAL LOW (ref 14.0–49.7)
MCH: 27.7 pg (ref 25.1–34.0)
MCHC: 33.6 g/dL (ref 31.5–36.0)
MCV: 82.6 fL (ref 79.5–101.0)
MONO#: 0.5 10*3/uL (ref 0.1–0.9)
MONO%: 5.7 % (ref 0.0–14.0)
NEUT#: 7.3 10*3/uL — ABNORMAL HIGH (ref 1.5–6.5)
NEUT%: 78.1 % — ABNORMAL HIGH (ref 38.4–76.8)
Platelets: 491 10*3/uL — ABNORMAL HIGH (ref 145–400)
RBC: 4.97 10*6/uL (ref 3.70–5.45)
RDW: 14.6 % — ABNORMAL HIGH (ref 11.2–14.5)
WBC: 9.4 10*3/uL (ref 3.9–10.3)
lymph#: 1.3 10*3/uL (ref 0.9–3.3)

## 2012-07-13 LAB — PROTHROMBIN TIME
INR: 4.56 — ABNORMAL HIGH (ref <1.50)
Prothrombin Time: 40.3 seconds — ABNORMAL HIGH (ref 11.6–15.2)

## 2012-07-13 LAB — PROTIME-INR

## 2012-07-13 LAB — POCT INR: INR: 4.56

## 2012-07-13 NOTE — Telephone Encounter (Signed)
, °

## 2012-07-13 NOTE — Patient Instructions (Signed)
Hold coumadin today (4/15) and tomorrow (4/16).  Take Coumadin 5mg  on 4/17. Repeat INR on 4/18;  Lab at 11am and coumadin clinic at 11:15am.

## 2012-07-13 NOTE — Progress Notes (Addendum)
INR above goal today. INR is increasing despite decreasing doses of coumadin over the last 10 days. 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 The area is about 3 inches long, 1 and 1/2 inches wide and raised from her arm by ~2 inches. She had 8mL of fluid aspirated on 07/12/12 by her PCP. No other problems to report regarding anticoagulation. She had one glass of wine last night. She has not missed any doses of coumadin. She has been taking about 2000mg  of Tylenol per day to help with the pain.  This may have slightly increased her INR. No other medication changes. Dr. Cyndie Chime examined pt today. Instructed pt cover the area with a 4x4 gauze to help prevent infection. Continue warm & wet compress to the area. Informed pt it may take ~6-8 weeks to resolve. Instructed pt to hold coumadin x 2 days on 4/15 and 4/16.  Resume coumadin at a decreased dose of 5mg  daily on 4/17. Recheck INR in 1 week on 07/20/12. Pt wanted to keep her already scheduled lab and coumadin clinic appt on 07/16/12 to see where her INR is at before the weekend.

## 2012-07-13 NOTE — Telephone Encounter (Signed)
Patient called.  She fell last Friday and has a huge hematoma on her arm.  She saw her GP on Friday.  She returned to GP's office yesterday and saw a different doctor who aspirated 8ml from hematoma.  It was some better, however today it is just as large and "hard as a brick."  She did speak to [name]/pharmacist last Friday in coumadin clinic.  She is on Coumadin 6mg  for 4days of the week and 5mg  the other 3 days. She is due a PT/INR this Friday.  She is very concerned about her arm and would like to be seen or at least would like input from Dr. Cyndie Chime..  Call back is [phone] or cell is [phone].

## 2012-07-14 ENCOUNTER — Telehealth: Payer: Self-pay | Admitting: *Deleted

## 2012-07-14 NOTE — Telephone Encounter (Signed)
Received call from pt stating that she was here yest & Dr Cyndie Chime looked at her arm/hematoma.  She has been using moist heat per Dr Cyndie Chime & was told that it would take a while to heal.  She reports that she has had some light colored blood on the gauze & noticed a few puss colored spots on abraised area.  She wants to know if she needs an ATB.  Discussed with Dr Cyndie Chime & informed pt that ATB not needed unless she is running a fever or having purulent drainage.  She will cont. moist heat & keep clean & dry after moist heat applied.

## 2012-07-16 ENCOUNTER — Ambulatory Visit (HOSPITAL_BASED_OUTPATIENT_CLINIC_OR_DEPARTMENT_OTHER): Payer: Worker's Compensation | Admitting: Pharmacist

## 2012-07-16 ENCOUNTER — Other Ambulatory Visit (HOSPITAL_BASED_OUTPATIENT_CLINIC_OR_DEPARTMENT_OTHER): Payer: Worker's Compensation | Admitting: Lab

## 2012-07-16 DIAGNOSIS — I82409 Acute embolism and thrombosis of unspecified deep veins of unspecified lower extremity: Secondary | ICD-10-CM

## 2012-07-16 DIAGNOSIS — K55059 Acute (reversible) ischemia of intestine, part and extent unspecified: Secondary | ICD-10-CM

## 2012-07-16 DIAGNOSIS — K55069 Acute infarction of intestine, part and extent unspecified: Secondary | ICD-10-CM

## 2012-07-16 LAB — PROTIME-INR
INR: 1.3 — ABNORMAL LOW (ref 2.00–3.50)
Protime: 15.6 Seconds — ABNORMAL HIGH (ref 10.6–13.4)

## 2012-07-16 NOTE — Patient Instructions (Signed)
INR below goal due to holding coumadin  Arm wound is improving and pain improving which is a great sign  Continue with dressing changes and gauze. Will take a few weeks to heal  Continue coumadin 5 mg daily   Return with scheduled appointment on Tuesday 07/20/12. Lab at 3:15 and coumadin at 3:30

## 2012-07-16 NOTE — Progress Notes (Signed)
Pt returns after holding coumadin x 2 days (restarted 5 mg last night on 4/17). Arm appears to be improving per patient and pain has gone down. See last note for anticoag. Instructed pt cover the area with a 4x4 gauze to help prevent infection.  Continue warm & wet compress to the area. Informed pt it may take ~6-8 weeks to resolve. INR is low at 1.3. Instructed to continue 5 mg daily and return with scheduled appointment for next Tuesday 07/20/12 at 3:15pm lab and 3:30pm coumadin clinic

## 2012-07-20 ENCOUNTER — Other Ambulatory Visit (HOSPITAL_BASED_OUTPATIENT_CLINIC_OR_DEPARTMENT_OTHER): Payer: Worker's Compensation | Admitting: Lab

## 2012-07-20 ENCOUNTER — Ambulatory Visit (HOSPITAL_BASED_OUTPATIENT_CLINIC_OR_DEPARTMENT_OTHER): Payer: Worker's Compensation | Admitting: Pharmacist

## 2012-07-20 DIAGNOSIS — K55069 Acute infarction of intestine, part and extent unspecified: Secondary | ICD-10-CM

## 2012-07-20 DIAGNOSIS — K55059 Acute (reversible) ischemia of intestine, part and extent unspecified: Secondary | ICD-10-CM

## 2012-07-20 DIAGNOSIS — I82409 Acute embolism and thrombosis of unspecified deep veins of unspecified lower extremity: Secondary | ICD-10-CM

## 2012-07-20 LAB — PROTIME-INR
INR: 3.2 (ref 2.00–3.50)
Protime: 38.4 Seconds — ABNORMAL HIGH (ref 10.6–13.4)

## 2012-07-20 LAB — POCT INR: INR: 3.2

## 2012-07-20 NOTE — Patient Instructions (Signed)
Decrease coumadin to 4mg  daily starting today.  Recheck INR on 07/23/12; lab at 10:45am and coumadin clinic at 11am.

## 2012-07-20 NOTE — Progress Notes (Signed)
INR slightly above goal today. INR has increased quickly in 4 days. INR on 07/16/12 = 1.3 (after holding coumadin for 2 days). Coumadin 5mg  daily resumed on 07/15/12. Right arm with continued bruising above and below the elbow. Bruises are healing. Hematoma is decreasing in size. Small amounts of bleeding from hematoma noted when removing the gauze. Pt range of motion is slowly improving. Pt is keeping the hematoma covered with Neosporin and gauze pads. She continues with the warm/wet compresses. She washes the area daily with soap and water with her shower. She remains on Tylenol for pain. She uses between 500mg  - 1500mg /day for the last 4 days. No other changes in diet, medications, etc. No other problems to report regarding anticoagulation. With significant increase in INR in 4 days, will slightly decrease coumadin to 4mg  daily starting today. Pt feels comfortable to recheck INR before the weekend on 07/23/12: lab at 10:30am and coumadin clinic at 10:45am. By checking the INR before the weekend, we can verify if the INR is still increasing or falling too quickly.

## 2012-07-23 ENCOUNTER — Ambulatory Visit: Payer: Medicare Other | Admitting: Pharmacist

## 2012-07-23 ENCOUNTER — Other Ambulatory Visit (HOSPITAL_BASED_OUTPATIENT_CLINIC_OR_DEPARTMENT_OTHER): Payer: Worker's Compensation

## 2012-07-23 DIAGNOSIS — I82409 Acute embolism and thrombosis of unspecified deep veins of unspecified lower extremity: Secondary | ICD-10-CM

## 2012-07-23 DIAGNOSIS — Z7901 Long term (current) use of anticoagulants: Secondary | ICD-10-CM

## 2012-07-23 DIAGNOSIS — K55059 Acute (reversible) ischemia of intestine, part and extent unspecified: Secondary | ICD-10-CM

## 2012-07-23 DIAGNOSIS — K55069 Acute infarction of intestine, part and extent unspecified: Secondary | ICD-10-CM

## 2012-07-23 LAB — PROTIME-INR
INR: 3.1 (ref 2.00–3.50)
Protime: 37.2 Seconds — ABNORMAL HIGH (ref 10.6–13.4)

## 2012-07-23 LAB — POCT INR: INR: 3.1

## 2012-07-23 NOTE — Progress Notes (Signed)
INR = 3.1 currently on Coumadin 4 mg daily since 07/20/12 R forearm still w/ significant hematoma but pt reports it is better.  The bruising on her entire arm has overall improved also.  There is a small amt of blood on her gauze covering.  The hematoma has a scab on top. INR has not risen since Tuesday which is good.  It really hasn't gone down much either.  We are stable at this point. Pt has been using moist heat for 20 minutes TID as instructed by her PCP.  She also has been keeping it covered w/ gauze. Her APAP use is minimal, enough to "knock the edge off." I am leaving pts dose the same since there has been improvement in the bruising/hematoma. I gave pt 20 tabs of Coumadin 4 mg strength today as samples. Repeat INR on Tues 07/27/12. [name], Pharm.D., CPP 07/23/2012@12 :01 PM

## 2012-07-27 ENCOUNTER — Other Ambulatory Visit (HOSPITAL_BASED_OUTPATIENT_CLINIC_OR_DEPARTMENT_OTHER): Payer: Worker's Compensation | Admitting: Lab

## 2012-07-27 ENCOUNTER — Ambulatory Visit (HOSPITAL_BASED_OUTPATIENT_CLINIC_OR_DEPARTMENT_OTHER): Payer: Worker's Compensation | Admitting: Pharmacist

## 2012-07-27 DIAGNOSIS — K55059 Acute (reversible) ischemia of intestine, part and extent unspecified: Secondary | ICD-10-CM

## 2012-07-27 DIAGNOSIS — K55069 Acute infarction of intestine, part and extent unspecified: Secondary | ICD-10-CM

## 2012-07-27 DIAGNOSIS — D471 Chronic myeloproliferative disease: Secondary | ICD-10-CM

## 2012-07-27 DIAGNOSIS — Z7901 Long term (current) use of anticoagulants: Secondary | ICD-10-CM

## 2012-07-27 DIAGNOSIS — Z5181 Encounter for therapeutic drug level monitoring: Secondary | ICD-10-CM

## 2012-07-27 LAB — PROTIME-INR
INR: 2 (ref 2.00–3.50)
Protime: 24 Seconds — ABNORMAL HIGH (ref 10.6–13.4)

## 2012-07-27 LAB — POCT INR: INR: 2

## 2012-07-27 NOTE — Progress Notes (Signed)
INR at goal of 2-3 on 4mg  daily.  Dr. Cyndie Chime examined hematoma on Gwendolyn Williams's arm and suggested she see a surgeon to have drained.  She will contact surgeon and keep [place] updated on this.  Will keep INR at lower end of goal range and continue 4mg  daily will check PT/INR in 1 week.

## 2012-07-28 ENCOUNTER — Telehealth (INDEPENDENT_AMBULATORY_CARE_PROVIDER_SITE_OTHER): Payer: Self-pay

## 2012-07-28 NOTE — Telephone Encounter (Signed)
Returned pt's call. The pt is wanting to be seen by only Dr Dwain Sarna b/c she fell on 4/11 and ended up with a hematoma. The pt saw Dr Cyndie Chime and he told her this area needed to be surgically drained b/c with the pt on Coumadin it will never reabsorb back into her arm. I made the pt an appt to see Dr Dwain Sarna on 5/1//14 at 10:15. The pt understands.

## 2012-07-28 NOTE — Telephone Encounter (Signed)
LMOM for this pt to call me back so I can see what she is talking about with a hematoma on her arm.

## 2012-07-29 ENCOUNTER — Encounter (INDEPENDENT_AMBULATORY_CARE_PROVIDER_SITE_OTHER): Payer: Self-pay | Admitting: General Surgery

## 2012-07-29 ENCOUNTER — Ambulatory Visit (INDEPENDENT_AMBULATORY_CARE_PROVIDER_SITE_OTHER): Payer: Worker's Compensation | Admitting: General Surgery

## 2012-07-29 VITALS — BP 108/62 | HR 74 | Resp 18 | Ht 60.0 in | Wt 103.0 lb

## 2012-07-29 DIAGNOSIS — T148XXA Other injury of unspecified body region, initial encounter: Secondary | ICD-10-CM

## 2012-08-01 NOTE — Progress Notes (Signed)
Subjective:     Patient ID: Gwendolyn Williams, female   DOB: 1940/10/21, 72 y.o.   MRN: [id number]  HPI 13 yof who I know very well after multiple abdominal surgeries and care for her breast cancer.  She is maintained on coumadin now. Recently while she was at work she had a fall on her right forearm and developed a large hematoma. This has been present for several weeks. There has been some drainage of old blood from it and this is at least half of what is was before.  It is still present thought but not causing a lot of symptoms except it is there.  Her INR was very high when it occurred and is better now.  She comes in as med onc recommended having this drained.  She is otherwise doing well.  Review of Systems     Objective:   Physical Exam Right forearm with 3x3 soft hematoma, no infection, open area that appears it drained from, able to move arm just fine, distally nvi    Assessment:     Right forearm hematoma    Plan:     This area has gotten better and I think best course would be to monitor for now.  She is agreeable to that also. I will see back in about 3 weeks to reassess.  If it gets worse or has any other trouble before then she will call me.

## 2012-08-02 ENCOUNTER — Other Ambulatory Visit: Payer: Medicare Other

## 2012-08-03 ENCOUNTER — Other Ambulatory Visit (HOSPITAL_BASED_OUTPATIENT_CLINIC_OR_DEPARTMENT_OTHER): Payer: Worker's Compensation | Admitting: Lab

## 2012-08-03 ENCOUNTER — Ambulatory Visit: Payer: Medicare Other | Admitting: Pharmacist

## 2012-08-03 DIAGNOSIS — K55069 Acute infarction of intestine, part and extent unspecified: Secondary | ICD-10-CM

## 2012-08-03 DIAGNOSIS — Z853 Personal history of malignant neoplasm of breast: Secondary | ICD-10-CM

## 2012-08-03 DIAGNOSIS — D47Z9 Other specified neoplasms of uncertain behavior of lymphoid, hematopoietic and related tissue: Secondary | ICD-10-CM

## 2012-08-03 DIAGNOSIS — M818 Other osteoporosis without current pathological fracture: Secondary | ICD-10-CM

## 2012-08-03 DIAGNOSIS — D471 Chronic myeloproliferative disease: Secondary | ICD-10-CM

## 2012-08-03 DIAGNOSIS — I82409 Acute embolism and thrombosis of unspecified deep veins of unspecified lower extremity: Secondary | ICD-10-CM

## 2012-08-03 LAB — CBC WITH DIFFERENTIAL/PLATELET
BASO%: 1 % (ref 0.0–2.0)
Basophils Absolute: 0.1 10*3/uL (ref 0.0–0.1)
EOS%: 2.5 % (ref 0.0–7.0)
Eosinophils Absolute: 0.2 10*3/uL (ref 0.0–0.5)
HCT: 38.9 % (ref 34.8–46.6)
HGB: 13.2 g/dL (ref 11.6–15.9)
LYMPH%: 14.2 % (ref 14.0–49.7)
MCH: 28.1 pg (ref 25.1–34.0)
MCHC: 33.8 g/dL (ref 31.5–36.0)
MCV: 83.2 fL (ref 79.5–101.0)
MONO#: 0.6 10*3/uL (ref 0.1–0.9)
MONO%: 6.2 % (ref 0.0–14.0)
NEUT#: 7.2 10*3/uL — ABNORMAL HIGH (ref 1.5–6.5)
NEUT%: 76.1 % (ref 38.4–76.8)
Platelets: 443 10*3/uL — ABNORMAL HIGH (ref 145–400)
RBC: 4.67 10*6/uL (ref 3.70–5.45)
RDW: 15.3 % — ABNORMAL HIGH (ref 11.2–14.5)
WBC: 9.5 10*3/uL (ref 3.9–10.3)
lymph#: 1.3 10*3/uL (ref 0.9–3.3)

## 2012-08-03 LAB — PROTIME-INR
INR: 2 (ref 2.00–3.50)
Protime: 24 Seconds — ABNORMAL HIGH (ref 10.6–13.4)

## 2012-08-03 LAB — COMPREHENSIVE METABOLIC PANEL (CC13)
ALT: 13 U/L (ref 0–55)
AST: 12 U/L (ref 5–34)
Albumin: 3.6 g/dL (ref 3.5–5.0)
Alkaline Phosphatase: 45 U/L (ref 40–150)
BUN: 22 mg/dL (ref 7.0–26.0)
CO2: 27 mEq/L (ref 22–29)
Calcium: 9.4 mg/dL (ref 8.4–10.4)
Chloride: 105 mEq/L (ref 98–107)
Creatinine: 0.9 mg/dL (ref 0.6–1.1)
Glucose: 84 mg/dl (ref 70–99)
Potassium: 3.9 mEq/L (ref 3.5–5.1)
Sodium: 141 mEq/L (ref 136–145)
Total Bilirubin: 0.41 mg/dL (ref 0.20–1.20)
Total Protein: 6.7 g/dL (ref 6.4–8.3)

## 2012-08-03 LAB — POCT INR: INR: 2

## 2012-08-03 NOTE — Progress Notes (Signed)
INR continues to stay right at 2.  Gwendolyn Williams had appt with Dr. Dwain Sarna last week.  Dr. Dwain Sarna decided not to drain hematoma and Gwendolyn Williams has a f/u with him on 5/12 along with appt with Dr. Cyndie Chime on same day.  Gwendolyn Williams stated that the size of hematoma has decreased.  She did have some bleeding out of the hematoma 1 day in the past week.  Will continue to try to maintain INR around 2 while hematoma healing, so will continue Coumdin 4mg  daily.  We have scheduled PT/INR in 2 weeks but will see her sooner if needed.  Will leave coumadin 4mg  samples and a prescription for Gwendolyn Williams to pick up at [facility].

## 2012-08-04 LAB — VITAMIN D 25 HYDROXY (VIT D DEFICIENCY, FRACTURES): Vit D, 25-Hydroxy: 65 ng/mL (ref 30–89)

## 2012-08-09 ENCOUNTER — Ambulatory Visit (INDEPENDENT_AMBULATORY_CARE_PROVIDER_SITE_OTHER): Payer: Worker's Compensation | Admitting: General Surgery

## 2012-08-09 ENCOUNTER — Telehealth: Payer: Self-pay | Admitting: Oncology

## 2012-08-09 ENCOUNTER — Encounter: Payer: Self-pay | Admitting: Oncology

## 2012-08-09 ENCOUNTER — Encounter (INDEPENDENT_AMBULATORY_CARE_PROVIDER_SITE_OTHER): Payer: Self-pay | Admitting: General Surgery

## 2012-08-09 ENCOUNTER — Ambulatory Visit (HOSPITAL_BASED_OUTPATIENT_CLINIC_OR_DEPARTMENT_OTHER): Payer: Worker's Compensation | Admitting: Oncology

## 2012-08-09 VITALS — BP 123/81 | HR 61 | Temp 97.0°F | Resp 18 | Ht 60.0 in | Wt 104.2 lb

## 2012-08-09 VITALS — BP 118/70 | HR 72 | Resp 18 | Ht 60.0 in | Wt 104.4 lb

## 2012-08-09 DIAGNOSIS — D471 Chronic myeloproliferative disease: Secondary | ICD-10-CM

## 2012-08-09 DIAGNOSIS — I10 Essential (primary) hypertension: Secondary | ICD-10-CM

## 2012-08-09 DIAGNOSIS — K55059 Acute (reversible) ischemia of intestine, part and extent unspecified: Secondary | ICD-10-CM

## 2012-08-09 DIAGNOSIS — S40021D Contusion of right upper arm, subsequent encounter: Secondary | ICD-10-CM

## 2012-08-09 DIAGNOSIS — D47Z9 Other specified neoplasms of uncertain behavior of lymphoid, hematopoietic and related tissue: Secondary | ICD-10-CM

## 2012-08-09 DIAGNOSIS — S51809A Unspecified open wound of unspecified forearm, initial encounter: Secondary | ICD-10-CM

## 2012-08-09 DIAGNOSIS — Z7901 Long term (current) use of anticoagulants: Secondary | ICD-10-CM

## 2012-08-09 DIAGNOSIS — K55069 Acute infarction of intestine, part and extent unspecified: Secondary | ICD-10-CM

## 2012-08-09 DIAGNOSIS — C50919 Malignant neoplasm of unspecified site of unspecified female breast: Secondary | ICD-10-CM

## 2012-08-09 DIAGNOSIS — S40029A Contusion of unspecified upper arm, initial encounter: Secondary | ICD-10-CM

## 2012-08-09 DIAGNOSIS — Z853 Personal history of malignant neoplasm of breast: Secondary | ICD-10-CM

## 2012-08-09 HISTORY — DX: Contusion of unspecified upper arm, initial encounter: S40.029A

## 2012-08-09 NOTE — Progress Notes (Signed)
Hematology and Oncology Follow Up Visit  Gwendolyn Williams [id number] [birth year] 72 y.o. 08/09/2012 8:31 PM   Principle Diagnosis: Encounter Diagnoses  Name Primary?  . Hematoma of arm, right, subsequent encounter   . History of breast cancer in female Yes  . Mesenteric thrombosis   . Myeloproliferative disorder   . 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 She had an elevated platelet count which initially was felt to be due to inflammation associated with the bowel infarction but which did not resolve. JAK-2 gene analysis study was positive. She was started on anticoagulation with Coumadin and aspirin.  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 High risk" type the scar. Treated with lumpectomy 12/26/2008 followed by adjuvant chemotherapy with 4 cycles of Taxotere plus Cytoxan between 02/16/2009 and 04/20/2009. She then had a course of radiation between February 14 and 07/02/2009. She was started on hormonal therapy with Arimidex which she continues at this time. BRCA1 and BRCA2 gene testing was negative. She developed a large chest wall hematoma at time of the surgery related to tight anticoagulation.  She fell down about 3 weeks ago and struck her right forearm and developed a large hematoma. I have monitored this unofficially when she comes in for her Coumadin testing. She did see her general Careers adviser. He did not feel that the hematoma needed to be aspirated. She is monitoring it along with local care. It has improved significantly.  Medications: reviewed  Allergies:  Allergies  Allergen Reactions  . Penicillins   . Sulfa Antibiotics    Unable to recall    Review of Systems: Constitutional:   No constitutional symptoms although she fatigues easily Respiratory: No cough or dyspnea Cardiovascular:  No chest pain or palpitations Gastrointestinal: No change in bowel habit, no abdominal pain Genito-Urinary: No urinary tract symptoms Musculoskeletal: No muscle or bone pain Neurologic: No headache or change in vision Skin: Right arm traumatic hematoma as noted Remaining ROS negative.  Physical Exam: Blood pressure 123/81, pulse 61, temperature 97 F (36.1 C), temperature source Oral, resp. rate 18, height 5' (1.524 m), weight 104 lb 3.2 oz (47.265 kg). Wt Readings from Last 3 Encounters:  08/09/12 104 lb 6.4 oz (47.356 kg)  08/09/12 104 lb 3.2 oz (47.265 kg)  07/29/12 103 lb (46.72 kg)     General appearance: Petite Caucasian woman [name]: Pharynx no erythema or exudate Lymph nodes: No adenopathy Breasts: Surgical changes left breast. No dominant mass in either breast Lungs: Clear to auscultation resonant to percussion Heart: Regular rhythm no murmur Abdomen: Soft, nontender, no mass, no organomegaly Extremities: No edema, no calf tenderness. 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, motor strength 5 over 5, reflexes 1+ symmetric Skin: Hematoma right arm  Lab Results: Lab Results  Component Value Date   WBC 9.5 08/03/2012   HGB 13.2 08/03/2012   HCT 38.9 08/03/2012   MCV 83.2 08/03/2012   PLT 443* 08/03/2012     Chemistry      Component Value Date/Time   NA 141 08/03/2012 1529   NA 138 08/04/2011 1537   K 3.9 08/03/2012 1529  K 3.7 08/04/2011 1537   CL 105 08/03/2012 1529   CL 103 08/04/2011 1537   CO2 27 08/03/2012 1529   CO2 24 08/04/2011 1537   BUN 22.0 08/03/2012 1529   BUN 16 08/04/2011 1537   CREATININE 0.9 08/03/2012 1529   CREATININE 0.88 08/04/2011 1537      Component Value Date/Time    CALCIUM 9.4 08/03/2012 1529   CALCIUM 9.7 08/04/2011 1537   ALKPHOS 45 08/03/2012 1529   ALKPHOS 49 08/04/2011 1537   AST 12 08/03/2012 1529   AST 17 08/04/2011 1537   ALT 13 08/03/2012 1529   ALT 16 08/04/2011 1537   BILITOT 0.41 08/03/2012 1529   BILITOT 0.4 08/04/2011 1537       Radiological Studies: No results found.  Impression: #1. JAK2-positive myeloproliferative disorder. Stable on low-dose aspirin and Coumadin. Continue the same.  #2. Status post mesenteric vascular thrombosis presenting with acute onset abdominal pain and hematochezia as first sign of her myeloproliferative disorder in January 2010.   #3. Stage I ER-positive HER-2 negative cancer of the left breast diagnosed August 2010, treated with lumpectomy, radiation, followed by 4 cycles of Cytoxan and Taxotere chemotherapy and currently on Arimidex hormonal therapy. Continue the Arimidex to complete 5 years of treatment.   #4. Essential hypertension.  #5. Traumatic hematoma right arm complicated by anticoagulant and aspirin therapy. Now resolving.      CC:. Dr. Blair Heys; Dr. Emelia Loron; Dr. Dorothy Puffer   Levert Feinstein, MD 5/12/20148:31 PM

## 2012-08-09 NOTE — Progress Notes (Signed)
Subjective:     Patient ID: Gwendolyn Williams, female   DOB: 05-01-1940, 72 y.o.   MRN: [id number]  HPI 10 yof who I know very well after multiple abdominal surgeries and care for her breast cancer. She is maintained on coumadin now. Recently while she was at work she had a fall on her right forearm and developed a large hematoma. This has been present for several weeks and I saw recently and it was slowly getting better.  Today she returns and this has drained a little more and feels significantly better.  She is happy with progress   Review of Systems     Objective:   Physical Exam 2x2.5 cm softer hematoma right forearm, no infection    Assessment:     Hematoma right forearm     Plan:     I think this should still be treated conservatively. I told her we might still need to do something but would trade the area for open wound while doing on coumadin. Will continue to follow until resolves but no urgency of drainage right now.  She is going to [place] later this summer and I think that will be fine.

## 2012-08-09 NOTE — Telephone Encounter (Signed)
gv pt appt schedule for May thru November. Also s/w [facility] and pt already on schedule for mammo 12/31/12 @ 9am. Pt aware.

## 2012-08-16 ENCOUNTER — Ambulatory Visit (HOSPITAL_BASED_OUTPATIENT_CLINIC_OR_DEPARTMENT_OTHER): Payer: Medicare Other | Admitting: Pharmacist

## 2012-08-16 ENCOUNTER — Other Ambulatory Visit (HOSPITAL_BASED_OUTPATIENT_CLINIC_OR_DEPARTMENT_OTHER): Payer: Worker's Compensation | Admitting: Lab

## 2012-08-16 DIAGNOSIS — K55069 Acute infarction of intestine, part and extent unspecified: Secondary | ICD-10-CM

## 2012-08-16 DIAGNOSIS — K55059 Acute (reversible) ischemia of intestine, part and extent unspecified: Secondary | ICD-10-CM

## 2012-08-16 DIAGNOSIS — I82409 Acute embolism and thrombosis of unspecified deep veins of unspecified lower extremity: Secondary | ICD-10-CM

## 2012-08-16 LAB — PROTIME-INR
INR: 1.6 — ABNORMAL LOW (ref 2.00–3.50)
Protime: 19.2 Seconds — ABNORMAL HIGH (ref 10.6–13.4)

## 2012-08-16 LAB — POCT INR: INR: 1.6

## 2012-08-16 NOTE — Patient Instructions (Signed)
Increase coumadin to 5mg  daily. Recheck INR on 08/26/12; lab at 3:30 pm and coumadin clinic at 3:45 pm

## 2012-08-16 NOTE — Progress Notes (Signed)
Pt seen in clinic today with INR=1.6 Hematoma is resolving.  She and Dr Dwain Sarna were keeping her INR on low end of normal in the event he needed to do surgery.   She feels she is out of that window and feels better increasing her dose to 5 mg daily and rechecking it in 10 days. Pt requested Rx for Coumadin 4mg  tablets, given at #30 UAD with 2 refills.  No other changes to report.

## 2012-08-17 ENCOUNTER — Other Ambulatory Visit: Payer: Medicare Other | Admitting: Lab

## 2012-08-17 ENCOUNTER — Ambulatory Visit: Payer: Medicare Other

## 2012-08-26 ENCOUNTER — Ambulatory Visit: Payer: Medicare Other | Admitting: Pharmacist

## 2012-08-26 ENCOUNTER — Other Ambulatory Visit (HOSPITAL_BASED_OUTPATIENT_CLINIC_OR_DEPARTMENT_OTHER): Payer: Worker's Compensation | Admitting: Lab

## 2012-08-26 DIAGNOSIS — I82409 Acute embolism and thrombosis of unspecified deep veins of unspecified lower extremity: Secondary | ICD-10-CM

## 2012-08-26 DIAGNOSIS — K55069 Acute infarction of intestine, part and extent unspecified: Secondary | ICD-10-CM

## 2012-08-26 LAB — PROTIME-INR
INR: 2.3 (ref 2.00–3.50)
Protime: 27.6 s — ABNORMAL HIGH (ref 10.6–13.4)

## 2012-08-26 LAB — POCT INR: INR: 2.3

## 2012-08-26 NOTE — Progress Notes (Signed)
Pt seen in clinic today  with her son. INR=2.3 on 5 mg daily She is going to [place] on June 21.  She request to be seen June 13 to make sure she remains therapuetic prior to her trip. No other changes or symptoms to report. Coumadin 5mg  daily. Recheck INR on 09/10/12; lab at 3:00 pm and coumadin clinic at 3:15 pm

## 2012-08-26 NOTE — Patient Instructions (Signed)
Coumadin 5mg  daily. Recheck INR on 09/10/12; lab at 3:00 pm and coumadin clinic at 3:15 pm

## 2012-08-30 ENCOUNTER — Ambulatory Visit (INDEPENDENT_AMBULATORY_CARE_PROVIDER_SITE_OTHER): Payer: Medicare Other | Admitting: General Surgery

## 2012-08-30 ENCOUNTER — Encounter (INDEPENDENT_AMBULATORY_CARE_PROVIDER_SITE_OTHER): Payer: Self-pay | Admitting: General Surgery

## 2012-08-30 VITALS — BP 118/62 | HR 66 | Temp 98.6°F | Resp 18 | Ht 60.0 in | Wt 106.4 lb

## 2012-08-30 DIAGNOSIS — S40021D Contusion of right upper arm, subsequent encounter: Secondary | ICD-10-CM

## 2012-08-30 DIAGNOSIS — Z5189 Encounter for other specified aftercare: Secondary | ICD-10-CM

## 2012-08-30 NOTE — Progress Notes (Signed)
Subjective:     Patient ID: Gwendolyn Williams, female   DOB: [birth year], 72 y.o.   MRN: [id number]  HPI  33 yof who I know very well after multiple abdominal surgeries and care for her breast cancer. She is maintained on coumadin now. Recently while she was at work she had a fall on her right forearm and developed a large hematoma. This has been present for several weeks and I saw recently and it was slowly getting better. This continues to be much better and softer.  It is not draining.  She has no real complaints today.   Review of Systems     Objective:   Physical Exam 2x2.5 cm softer hematoma right forearm, no infection      Assessment:     Hematoma right forearm, resolving     Plan:     I still think draining this would cause more problems than anything.  Will continue to let this resolve.  I told her to call in next couple months if she wants me to look again.  Doesn't need to cover this area.

## 2012-09-10 ENCOUNTER — Other Ambulatory Visit (HOSPITAL_BASED_OUTPATIENT_CLINIC_OR_DEPARTMENT_OTHER): Payer: Worker's Compensation | Admitting: Lab

## 2012-09-10 ENCOUNTER — Ambulatory Visit: Payer: Medicare Other | Admitting: Pharmacist

## 2012-09-10 DIAGNOSIS — K55069 Acute infarction of intestine, part and extent unspecified: Secondary | ICD-10-CM

## 2012-09-10 DIAGNOSIS — Z5181 Encounter for therapeutic drug level monitoring: Secondary | ICD-10-CM

## 2012-09-10 DIAGNOSIS — Z7901 Long term (current) use of anticoagulants: Secondary | ICD-10-CM

## 2012-09-10 DIAGNOSIS — I82409 Acute embolism and thrombosis of unspecified deep veins of unspecified lower extremity: Secondary | ICD-10-CM

## 2012-09-10 LAB — PROTIME-INR
INR: 1.3 — ABNORMAL LOW (ref 2.00–3.50)
Protime: 15.6 Seconds — ABNORMAL HIGH (ref 10.6–13.4)

## 2012-09-10 LAB — POCT INR: INR: 1.3

## 2012-09-10 NOTE — Progress Notes (Signed)
INR below goal today.  Gwendolyn Williams forgot to take Coumadin 2 days ago.  The INR result today is pretty dramatic if just from missing one dose, but Gwendolyn Williams's INR has been inconsistent over the past couple months.  Will take 7.5mg  today than resume 5mg  daily and will check PT/INR on 09/15/12.  Gwendolyn Williams will be leaving on 09/18/12 for a 10day trip to [place].

## 2012-09-15 ENCOUNTER — Ambulatory Visit (HOSPITAL_BASED_OUTPATIENT_CLINIC_OR_DEPARTMENT_OTHER): Payer: Medicare Other | Admitting: Pharmacist

## 2012-09-15 ENCOUNTER — Other Ambulatory Visit: Payer: Medicare Other | Admitting: Lab

## 2012-09-15 DIAGNOSIS — I82409 Acute embolism and thrombosis of unspecified deep veins of unspecified lower extremity: Secondary | ICD-10-CM

## 2012-09-15 DIAGNOSIS — K55069 Acute infarction of intestine, part and extent unspecified: Secondary | ICD-10-CM

## 2012-09-15 DIAGNOSIS — K55059 Acute (reversible) ischemia of intestine, part and extent unspecified: Secondary | ICD-10-CM

## 2012-09-15 LAB — POCT INR: INR: 1.9

## 2012-09-15 LAB — PROTIME-INR
INR: 1.9 — ABNORMAL LOW (ref 2.00–3.50)
Protime: 22.8 Seconds — ABNORMAL HIGH (ref 10.6–13.4)

## 2012-09-15 NOTE — Progress Notes (Signed)
INR nearly at goal after taking 7.5mg  x 1 on 09/10/12. No problems to report regarding anticoagulation. No s/s of clotting. No changes in diet, medications, etc. No missed doses. Will increase coumadin to 5mg  daily except 6mg  on Wed & Sat. Pt will continue this dose while she is in [place] Recheck INR on 09/17/12; lab at 11:30 am and coumadin clinic at 11:45 am. Pt will be leaving for [place] on 09/18/12.  She feels more comfortable knowing her INR result as close to leaving for [place] as possible (since INR borderline subtherapeutic/therapeutic). Pt knows to get up frequently to walk around while on the plane and to do leg/calf exercises while sitting.

## 2012-09-15 NOTE — Patient Instructions (Signed)
Increase coumadin to 5mg  daily except 6mg  on Wed & Sat.. Recheck INR on 09/17/12; lab at 11:30 am and coumadin clinic at 11:45 am. Pt will be leaving for [place] on 09/18/12.

## 2012-09-17 ENCOUNTER — Other Ambulatory Visit (HOSPITAL_BASED_OUTPATIENT_CLINIC_OR_DEPARTMENT_OTHER): Payer: Medicare Other | Admitting: Lab

## 2012-09-17 ENCOUNTER — Ambulatory Visit: Payer: Medicare Other | Admitting: Pharmacist

## 2012-09-17 DIAGNOSIS — K55069 Acute infarction of intestine, part and extent unspecified: Secondary | ICD-10-CM

## 2012-09-17 DIAGNOSIS — I82409 Acute embolism and thrombosis of unspecified deep veins of unspecified lower extremity: Secondary | ICD-10-CM

## 2012-09-17 LAB — PROTIME-INR
INR: 2.4 (ref 2.00–3.50)
Protime: 28.8 Seconds — ABNORMAL HIGH (ref 10.6–13.4)

## 2012-09-17 LAB — POCT INR: INR: 2.4

## 2012-09-17 NOTE — Patient Instructions (Signed)
Coumadin to 5mg  daily except 6mg  on Wed & Sat.. Recheck INR on 09/29/12; lab at 3:00 pm and coumadin clinic at 3:15 pm.

## 2012-09-17 NOTE — Progress Notes (Signed)
Pt seen per her request today, as she is leaving for a trip to [place] and [employer]. INR=2.4 Continue Coumadin 5mg  daily except 6mg  on Wed & Sat.. Recheck INR on 09/29/12; lab at 3:00 pm and coumadin clinic at 3:15 pm.

## 2012-09-29 ENCOUNTER — Ambulatory Visit: Payer: Medicare Other | Admitting: Pharmacist

## 2012-09-29 ENCOUNTER — Other Ambulatory Visit (HOSPITAL_BASED_OUTPATIENT_CLINIC_OR_DEPARTMENT_OTHER): Payer: Medicare Other | Admitting: Lab

## 2012-09-29 DIAGNOSIS — I82409 Acute embolism and thrombosis of unspecified deep veins of unspecified lower extremity: Secondary | ICD-10-CM

## 2012-09-29 DIAGNOSIS — K55069 Acute infarction of intestine, part and extent unspecified: Secondary | ICD-10-CM

## 2012-09-29 LAB — PROTIME-INR
INR: 2.9 (ref 2.00–3.50)
Protime: 34.8 Seconds — ABNORMAL HIGH (ref 10.6–13.4)

## 2012-09-29 LAB — POCT INR: INR: 2.9

## 2012-09-29 NOTE — Progress Notes (Signed)
INR at goal on 5mg  daily with 6mg  on Wed and Fri.  Gwendolyn Williams enjoyed her trip to [place] and [place].  No complaints.  Will continue current coumadin dose and check PT/INR in 2 weeks.

## 2012-10-13 ENCOUNTER — Ambulatory Visit (HOSPITAL_BASED_OUTPATIENT_CLINIC_OR_DEPARTMENT_OTHER): Payer: Medicare Other | Admitting: Pharmacist

## 2012-10-13 ENCOUNTER — Other Ambulatory Visit (HOSPITAL_BASED_OUTPATIENT_CLINIC_OR_DEPARTMENT_OTHER): Payer: Medicare Other | Admitting: Lab

## 2012-10-13 DIAGNOSIS — I82409 Acute embolism and thrombosis of unspecified deep veins of unspecified lower extremity: Secondary | ICD-10-CM

## 2012-10-13 DIAGNOSIS — K55059 Acute (reversible) ischemia of intestine, part and extent unspecified: Secondary | ICD-10-CM

## 2012-10-13 DIAGNOSIS — K55069 Acute infarction of intestine, part and extent unspecified: Secondary | ICD-10-CM

## 2012-10-13 DIAGNOSIS — Z5181 Encounter for therapeutic drug level monitoring: Secondary | ICD-10-CM

## 2012-10-13 DIAGNOSIS — Z7901 Long term (current) use of anticoagulants: Secondary | ICD-10-CM

## 2012-10-13 LAB — PROTIME-INR
INR: 2.4 (ref 2.00–3.50)
Protime: 28.8 Seconds — ABNORMAL HIGH (ref 10.6–13.4)

## 2012-10-13 LAB — POCT INR: INR: 2.4

## 2012-10-13 NOTE — Progress Notes (Signed)
INR at goal on 6mg  W/Sat and 5mg  other days.  No medication changes.  Ms Gwendolyn Williams continues to be doing well.  She has been on current coumadin dose for over 1 month and will continue.  Will check PT/INR in 1 month.

## 2012-10-14 ENCOUNTER — Encounter: Payer: Self-pay | Admitting: Pharmacist

## 2012-10-14 NOTE — Progress Notes (Signed)
Gwendolyn Williams will be having CMET, uric acid and LDH checked on 11/05/12 along with PT/INR per Dr. Patsy Lager orders.

## 2012-11-05 ENCOUNTER — Ambulatory Visit (HOSPITAL_BASED_OUTPATIENT_CLINIC_OR_DEPARTMENT_OTHER): Payer: Medicare Other | Admitting: Pharmacist

## 2012-11-05 ENCOUNTER — Other Ambulatory Visit (HOSPITAL_BASED_OUTPATIENT_CLINIC_OR_DEPARTMENT_OTHER): Payer: Medicare Other

## 2012-11-05 DIAGNOSIS — S40021D Contusion of right upper arm, subsequent encounter: Secondary | ICD-10-CM

## 2012-11-05 DIAGNOSIS — K55069 Acute infarction of intestine, part and extent unspecified: Secondary | ICD-10-CM

## 2012-11-05 DIAGNOSIS — D471 Chronic myeloproliferative disease: Secondary | ICD-10-CM

## 2012-11-05 DIAGNOSIS — K55059 Acute (reversible) ischemia of intestine, part and extent unspecified: Secondary | ICD-10-CM

## 2012-11-05 DIAGNOSIS — Z7901 Long term (current) use of anticoagulants: Secondary | ICD-10-CM

## 2012-11-05 DIAGNOSIS — I82409 Acute embolism and thrombosis of unspecified deep veins of unspecified lower extremity: Secondary | ICD-10-CM

## 2012-11-05 DIAGNOSIS — D47Z9 Other specified neoplasms of uncertain behavior of lymphoid, hematopoietic and related tissue: Secondary | ICD-10-CM

## 2012-11-05 DIAGNOSIS — Z853 Personal history of malignant neoplasm of breast: Secondary | ICD-10-CM

## 2012-11-05 LAB — CBC WITH DIFFERENTIAL/PLATELET
BASO%: 0.6 % (ref 0.0–2.0)
Basophils Absolute: 0.1 10*3/uL (ref 0.0–0.1)
EOS%: 2.1 % (ref 0.0–7.0)
Eosinophils Absolute: 0.2 10*3/uL (ref 0.0–0.5)
HCT: 39.9 % (ref 34.8–46.6)
HGB: 13.4 g/dL (ref 11.6–15.9)
LYMPH%: 14.7 % (ref 14.0–49.7)
MCH: 27.9 pg (ref 25.1–34.0)
MCHC: 33.5 g/dL (ref 31.5–36.0)
MCV: 83.3 fL (ref 79.5–101.0)
MONO#: 0.5 10*3/uL (ref 0.1–0.9)
MONO%: 5.8 % (ref 0.0–14.0)
NEUT#: 6.5 10*3/uL (ref 1.5–6.5)
NEUT%: 76.8 % (ref 38.4–76.8)
Platelets: 416 10*3/uL — ABNORMAL HIGH (ref 145–400)
RBC: 4.79 10*6/uL (ref 3.70–5.45)
RDW: 14.8 % — ABNORMAL HIGH (ref 11.2–14.5)
WBC: 8.5 10*3/uL (ref 3.9–10.3)
lymph#: 1.3 10*3/uL (ref 0.9–3.3)

## 2012-11-05 LAB — MORPHOLOGY
PLT EST: INCREASED
White Cell Comments: 3

## 2012-11-05 LAB — PROTIME-INR
INR: 2.5 (ref 2.00–3.50)
Protime: 30 Seconds — ABNORMAL HIGH (ref 10.6–13.4)

## 2012-11-05 LAB — COMPREHENSIVE METABOLIC PANEL (CC13)
ALT: 13 U/L (ref 0–55)
AST: 13 U/L (ref 5–34)
Albumin: 3.5 g/dL (ref 3.5–5.0)
Alkaline Phosphatase: 46 U/L (ref 40–150)
BUN: 25 mg/dL (ref 7.0–26.0)
CO2: 28 mEq/L (ref 22–29)
Calcium: 9.5 mg/dL (ref 8.4–10.4)
Chloride: 103 mEq/L (ref 98–109)
Creatinine: 1 mg/dL (ref 0.6–1.1)
Glucose: 82 mg/dl (ref 70–140)
Potassium: 4 mEq/L (ref 3.5–5.1)
Sodium: 141 mEq/L (ref 136–145)
Total Bilirubin: 0.47 mg/dL (ref 0.20–1.20)
Total Protein: 6.8 g/dL (ref 6.4–8.3)

## 2012-11-05 LAB — POCT INR: INR: 2.5

## 2012-11-05 LAB — LACTATE DEHYDROGENASE (CC13): LDH: 244 U/L (ref 125–245)

## 2012-11-05 LAB — URIC ACID (CC13): Uric Acid, Serum: 5.3 mg/dl (ref 2.6–7.4)

## 2012-11-05 NOTE — Patient Instructions (Addendum)
INR at goal  No changes  Coumadin to 5mg  daily except 6mg  on Wed & Sat..  Recheck INR on 12/03/12; lab at 3:30 pm and coumadin clinic at 3:45 pm.

## 2012-11-05 NOTE — Progress Notes (Signed)
INR at goal today.  Pt is doing well with no issues. No missed doses or extra doses No issues regarding bleeding or bruising Plan to continue Coumadin 5mg  daily except 6mg  on Wed & Sat. Recheck INR on 12/03/12; lab at 3:30 pm and coumadin clinic at 3:45 pm.

## 2012-11-08 ENCOUNTER — Other Ambulatory Visit: Payer: Medicare Other

## 2012-12-03 ENCOUNTER — Other Ambulatory Visit (HOSPITAL_BASED_OUTPATIENT_CLINIC_OR_DEPARTMENT_OTHER): Payer: Medicare Other | Admitting: Lab

## 2012-12-03 ENCOUNTER — Ambulatory Visit (HOSPITAL_BASED_OUTPATIENT_CLINIC_OR_DEPARTMENT_OTHER): Payer: Medicare Other | Admitting: Pharmacist

## 2012-12-03 DIAGNOSIS — K55059 Acute (reversible) ischemia of intestine, part and extent unspecified: Secondary | ICD-10-CM

## 2012-12-03 DIAGNOSIS — K55069 Acute infarction of intestine, part and extent unspecified: Secondary | ICD-10-CM

## 2012-12-03 DIAGNOSIS — I82409 Acute embolism and thrombosis of unspecified deep veins of unspecified lower extremity: Secondary | ICD-10-CM

## 2012-12-03 LAB — PROTIME-INR
INR: 2.5 (ref 2.00–3.50)
Protime: 30 Seconds — ABNORMAL HIGH (ref 10.6–13.4)

## 2012-12-03 NOTE — Patient Instructions (Addendum)
Coumadin to 5mg  daily except 6mg  on Wed & Sat.. Recheck INR on 01/07/13; lab at 3:30 pm and coumadin clinic at 3:45 pm

## 2012-12-03 NOTE — Progress Notes (Signed)
Pt request to be seen in 5 weeks. No changes to report will continue same dose Coumadin to 5mg  daily except 6mg  on Wed & Sat.. Recheck INR on 01/07/13; lab at 3:30 pm and coumadin clinic at 3:45 pm

## 2012-12-31 DIAGNOSIS — Z853 Personal history of malignant neoplasm of breast: Secondary | ICD-10-CM | POA: Diagnosis not present

## 2013-01-05 ENCOUNTER — Encounter (INDEPENDENT_AMBULATORY_CARE_PROVIDER_SITE_OTHER): Payer: Self-pay | Admitting: General Surgery

## 2013-01-07 ENCOUNTER — Other Ambulatory Visit (HOSPITAL_BASED_OUTPATIENT_CLINIC_OR_DEPARTMENT_OTHER): Payer: Medicare Other | Admitting: Lab

## 2013-01-07 ENCOUNTER — Ambulatory Visit (HOSPITAL_BASED_OUTPATIENT_CLINIC_OR_DEPARTMENT_OTHER): Payer: Medicare Other | Admitting: Pharmacist

## 2013-01-07 DIAGNOSIS — I82409 Acute embolism and thrombosis of unspecified deep veins of unspecified lower extremity: Secondary | ICD-10-CM

## 2013-01-07 DIAGNOSIS — K55059 Acute (reversible) ischemia of intestine, part and extent unspecified: Secondary | ICD-10-CM

## 2013-01-07 DIAGNOSIS — K55069 Acute infarction of intestine, part and extent unspecified: Secondary | ICD-10-CM

## 2013-01-07 LAB — PROTIME-INR
INR: 2.3 (ref 2.00–3.50)
Protime: 27.6 Seconds — ABNORMAL HIGH (ref 10.6–13.4)

## 2013-01-07 LAB — POCT INR: INR: 2.3

## 2013-01-07 NOTE — Patient Instructions (Signed)
INR at goal Continue coumadin 5mg  daily except 6mg  on Wed & Sat.. Recheck INR in 1 month on 02/07/13; lab at 2:00 pm and coumadin clinic at 2:15 pm. And Dr. Reece Agar appointment at 2:30pm

## 2013-01-07 NOTE — Progress Notes (Signed)
INR at goal No issues to report No missed doses or extra doses No bleeding with some minor bruising that is healing Pt has been stable on this dose for about 4 months now Will continue 5 mg daily except for 6 mg on Wednesdays and Saturdays Return in 1 month for follow up in the coumadin clinic on 02/07/13 with scheduled lab and MD visit

## 2013-02-07 ENCOUNTER — Ambulatory Visit (HOSPITAL_BASED_OUTPATIENT_CLINIC_OR_DEPARTMENT_OTHER): Payer: Medicare Other | Admitting: Pharmacist

## 2013-02-07 ENCOUNTER — Other Ambulatory Visit (HOSPITAL_BASED_OUTPATIENT_CLINIC_OR_DEPARTMENT_OTHER): Payer: Medicare Other | Admitting: Lab

## 2013-02-07 ENCOUNTER — Telehealth: Payer: Self-pay | Admitting: Oncology

## 2013-02-07 ENCOUNTER — Ambulatory Visit (HOSPITAL_BASED_OUTPATIENT_CLINIC_OR_DEPARTMENT_OTHER): Payer: Medicare Other | Admitting: Oncology

## 2013-02-07 VITALS — BP 109/73 | HR 69 | Temp 97.9°F | Resp 18 | Ht 60.0 in | Wt 105.5 lb

## 2013-02-07 DIAGNOSIS — D471 Chronic myeloproliferative disease: Secondary | ICD-10-CM

## 2013-02-07 DIAGNOSIS — D47Z9 Other specified neoplasms of uncertain behavior of lymphoid, hematopoietic and related tissue: Secondary | ICD-10-CM

## 2013-02-07 DIAGNOSIS — K55059 Acute (reversible) ischemia of intestine, part and extent unspecified: Secondary | ICD-10-CM

## 2013-02-07 DIAGNOSIS — Z86718 Personal history of other venous thrombosis and embolism: Secondary | ICD-10-CM

## 2013-02-07 DIAGNOSIS — Z853 Personal history of malignant neoplasm of breast: Secondary | ICD-10-CM

## 2013-02-07 DIAGNOSIS — K55069 Acute infarction of intestine, part and extent unspecified: Secondary | ICD-10-CM

## 2013-02-07 DIAGNOSIS — C50919 Malignant neoplasm of unspecified site of unspecified female breast: Secondary | ICD-10-CM

## 2013-02-07 DIAGNOSIS — S40021D Contusion of right upper arm, subsequent encounter: Secondary | ICD-10-CM

## 2013-02-07 DIAGNOSIS — I82409 Acute embolism and thrombosis of unspecified deep veins of unspecified lower extremity: Secondary | ICD-10-CM

## 2013-02-07 DIAGNOSIS — Z7901 Long term (current) use of anticoagulants: Secondary | ICD-10-CM

## 2013-02-07 LAB — POCT INR: INR: 2.1

## 2013-02-07 LAB — CBC WITH DIFFERENTIAL/PLATELET
BASO%: 0.5 % (ref 0.0–2.0)
Basophils Absolute: 0.1 10*3/uL (ref 0.0–0.1)
EOS%: 2.4 % (ref 0.0–7.0)
Eosinophils Absolute: 0.3 10*3/uL (ref 0.0–0.5)
HCT: 44.4 % (ref 34.8–46.6)
HGB: 14.6 g/dL (ref 11.6–15.9)
LYMPH%: 12.5 % — ABNORMAL LOW (ref 14.0–49.7)
MCH: 27.3 pg (ref 25.1–34.0)
MCHC: 32.9 g/dL (ref 31.5–36.0)
MCV: 83 fL (ref 79.5–101.0)
MONO#: 0.6 10*3/uL (ref 0.1–0.9)
MONO%: 5.2 % (ref 0.0–14.0)
NEUT#: 9 10*3/uL — ABNORMAL HIGH (ref 1.5–6.5)
NEUT%: 79.4 % — ABNORMAL HIGH (ref 38.4–76.8)
Platelets: 450 10*3/uL — ABNORMAL HIGH (ref 145–400)
RBC: 5.35 10*6/uL (ref 3.70–5.45)
RDW: 15.1 % — ABNORMAL HIGH (ref 11.2–14.5)
WBC: 11.3 10*3/uL — ABNORMAL HIGH (ref 3.9–10.3)
lymph#: 1.4 10*3/uL (ref 0.9–3.3)
nRBC: 0 % (ref 0–0)

## 2013-02-07 LAB — PROTIME-INR
INR: 2.1 (ref 2.00–3.50)
Protime: 25.2 Seconds — ABNORMAL HIGH (ref 10.6–13.4)

## 2013-02-07 LAB — MORPHOLOGY: PLT EST: INCREASED

## 2013-02-07 NOTE — Telephone Encounter (Signed)
gv and printed appt sched and avs for pt for Feb 2015 °

## 2013-02-07 NOTE — Progress Notes (Signed)
INR = 2.1 on Coumadin 5 mg/day; 6 mg on Wed/Sat No issues to report re: anticoag. No med changes in last 1 month. Pt will be having cataract surgery on 03/21/13. INR at goal.  No change necessary to Coumadin dose. Return in 5 weeks (before cataract removal). Samples: Coumadin 5 mg x 30 tabs (lot # [id number]; exp 05/2014) Pt sees Dr. Cyndie Chime today. [name], Pharm.D., CPP 02/07/2013@2 :50 PM

## 2013-02-08 NOTE — Progress Notes (Signed)
Hematology and Oncology Follow Up Visit  Gwendolyn Williams [id number] [birth year] 72 y.o. 02/08/2013 4:07 PM   Principle Diagnosis: Encounter Diagnoses  Name Primary?  . History of breast cancer in female   . Myeloproliferative disorder Yes  . 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 She had an elevated platelet count which initially was felt to be due to inflammation associated with the bowel infarction but which did not resolve. JAK-2 gene analysis study was positive. She was started on anticoagulation with Coumadin and aspirin.  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 High risk  Oncotype Dx score. Treated with lumpectomy 12/26/2008 followed by adjuvant chemotherapy with 4 cycles of Taxotere plus Cytoxan between 02/16/2009 and 04/20/2009. She then had a course of radiation between February 14 and 07/02/2009. She was started on hormonal therapy with Arimidex which she continues at this time. BRCA1 and BRCA2 gene testing was negative.  She developed a large chest wall hematoma at time of the surgery related to tight anticoagulation.  No interim medical problems.   Medications: reviewed  Allergies:  Allergies  Allergen Reactions  . Penicillins   . Sulfa Antibiotics     Unable to recall    Review of Systems: Hematology: See above ENT ROS: No sore throat Breast ROS: See above Respiratory ROS: No cough or dyspnea Cardiovascular ROS:   No chest pain or palpitations Gastrointestinal ROS:   No abdominal pain, no hematochezia or melena Genito-Urinary ROS: No urinary tract symptoms  Musculoskeletal ROS: No muscle bone or joint pain Neurological ROS: No headache or change in vision, no focal  weakness, no paresthesias Dermatological ROS: No rash or ecchymosis. Remaining ROS negative.  Physical Exam: Blood pressure 109/73, pulse 69, temperature 97.9 F (36.6 C), temperature source Oral, resp. rate 18, height 5' (1.524 m), weight 105 lb 8 oz (47.854 kg). Wt Readings from Last 3 Encounters:  02/07/13 105 lb 8 oz (47.854 kg)  08/30/12 106 lb 6.4 oz (48.263 kg)  08/09/12 104 lb 6.4 oz (47.356 kg)     General appearance: Thin Caucasian woman [name]: Pharynx no erythema, exudate, mass, or ulcer. No thyromegaly or thyroid nodules Lymph nodes: No cervical, supraclavicular, lymphadenopathy. Questionable small subcentimeter lymph node intermittently palpable in the left axilla Breasts: No abnormal skin changes, no dominant mass in either breast, extensive surgical changes in the left breast with folding of the skin and deviation of the nipple Lungs: Clear to auscultation, resonant to percussion throughout Heart: Regular rhythm, no murmur, no gallop, no rub, no click, no edema Abdomen: Soft, nontender, normal bowel sounds, no mass, no organomegaly Extremities: No edema, no calf tenderness Musculoskeletal: no joint deformities GU:  Vascular: Carotid pulses 2+, no bruits, distal pulses: Dorsalis pedis 1+ symmetric Neurologic: Alert, oriented, PERRLA, optic disc sharp and vessels normal on the left not well visualized on the right, no hemorrhage or exudate, cranial nerves grossly normal, motor strength 5 over 5, reflexes 1+ symmetric, upper body coordination normal, gait normal, Skin: No rash or ecchymosis  Lab Results: CBC W/Diff    Component Value Date/Time   WBC 11.3* 02/07/2013 1417   WBC 3.7* 09/18/2009 0457   RBC 5.35 02/07/2013 1417   RBC 3.92 09/18/2009 0457   HGB 14.6 02/07/2013 1417  HGB 9.1* 09/18/2009 0457   HCT 44.4 02/07/2013 1417   HCT 29.2* 09/18/2009 0457   PLT 450* 02/07/2013 1417   PLT 254 09/18/2009 0457   MCV 83.0 02/07/2013 1417   MCV 74.7* 09/18/2009 0457    MCH 27.3 02/07/2013 1417   MCH 24.8* 01/08/2010 1506   MCHC 32.9 02/07/2013 1417   MCHC 31.0 09/18/2009 0457   RDW 15.1* 02/07/2013 1417   RDW 20.2* 09/18/2009 0457   LYMPHSABS 1.4 02/07/2013 1417   LYMPHSABS 1.0 09/06/2009 1400   MONOABS 0.6 02/07/2013 1417   MONOABS 0.3 09/06/2009 1400   EOSABS 0.3 02/07/2013 1417   EOSABS 0.1 09/06/2009 1400   BASOSABS 0.1 02/07/2013 1417   BASOSABS 0.0 09/06/2009 1400     Chemistry      Component Value Date/Time   NA 141 11/05/2012 1358   NA 138 08/04/2011 1537   K 4.0 11/05/2012 1358   K 3.7 08/04/2011 1537   CL 105 08/03/2012 1529   CL 103 08/04/2011 1537   CO2 28 11/05/2012 1358   CO2 24 08/04/2011 1537   BUN 25.0 11/05/2012 1358   BUN 16 08/04/2011 1537   CREATININE 1.0 11/05/2012 1358   CREATININE 0.88 08/04/2011 1537      Component Value Date/Time   CALCIUM 9.5 11/05/2012 1358   CALCIUM 9.7 08/04/2011 1537   ALKPHOS 46 11/05/2012 1358   ALKPHOS 49 08/04/2011 1537   AST 13 11/05/2012 1358   AST 17 08/04/2011 1537   ALT 13 11/05/2012 1358   ALT 16 08/04/2011 1537   BILITOT 0.47 11/05/2012 1358   BILITOT 0.4 08/04/2011 1537       Radiological Studies: Mammograms 12/31/2012: Surgical changes on the left. No dominant mass or abnormal calcifications in either breast.   Impression:  #1. JAK2-positive myeloproliferative disorder. Stable on low-dose aspirin and Coumadin. Continue the same.   #2. Status post mesenteric vascular thrombosis presenting with acute onset abdominal pain and hematochezia as first sign of her myeloproliferative disorder in January 2010.   #3. Stage I ER-positive HER-2 negative cancer of the left breast diagnosed August 2010, treated with lumpectomy, radiation, followed by 4 cycles of Cytoxan and Taxotere chemotherapy and currently on Arimidex hormonal therapy. Continue the Arimidex to complete 5 years of treatment.  I might have her come back in 3 months instead of 6 months to reexamine the left axilla.  #4. Essential hypertension.     CC: Patient  Care Team: Blair Heys, MD as PCP - General (Family Medicine)   Levert Feinstein, MD 11/11/20144:07 PM

## 2013-03-10 ENCOUNTER — Encounter: Payer: Self-pay | Admitting: Oncology

## 2013-03-14 ENCOUNTER — Other Ambulatory Visit: Payer: Self-pay | Admitting: *Deleted

## 2013-03-14 DIAGNOSIS — Z853 Personal history of malignant neoplasm of breast: Secondary | ICD-10-CM

## 2013-03-14 MED ORDER — ANASTROZOLE 1 MG PO TABS: 1.0000 mg | ORAL_TABLET | Freq: Every day | ORAL | Status: DC

## 2013-03-18 ENCOUNTER — Other Ambulatory Visit (HOSPITAL_BASED_OUTPATIENT_CLINIC_OR_DEPARTMENT_OTHER): Payer: Medicare Other

## 2013-03-18 ENCOUNTER — Ambulatory Visit (HOSPITAL_BASED_OUTPATIENT_CLINIC_OR_DEPARTMENT_OTHER): Payer: Medicare Other | Admitting: Pharmacist

## 2013-03-18 DIAGNOSIS — D47Z9 Other specified neoplasms of uncertain behavior of lymphoid, hematopoietic and related tissue: Secondary | ICD-10-CM

## 2013-03-18 DIAGNOSIS — K55069 Acute infarction of intestine, part and extent unspecified: Secondary | ICD-10-CM

## 2013-03-18 DIAGNOSIS — K55059 Acute (reversible) ischemia of intestine, part and extent unspecified: Secondary | ICD-10-CM

## 2013-03-18 DIAGNOSIS — I82409 Acute embolism and thrombosis of unspecified deep veins of unspecified lower extremity: Secondary | ICD-10-CM

## 2013-03-18 DIAGNOSIS — Z7901 Long term (current) use of anticoagulants: Secondary | ICD-10-CM

## 2013-03-18 DIAGNOSIS — Z853 Personal history of malignant neoplasm of breast: Secondary | ICD-10-CM

## 2013-03-18 DIAGNOSIS — D471 Chronic myeloproliferative disease: Secondary | ICD-10-CM

## 2013-03-18 LAB — CBC WITH DIFFERENTIAL/PLATELET
BASO%: 0.7 % (ref 0.0–2.0)
Basophils Absolute: 0.1 10*3/uL (ref 0.0–0.1)
EOS%: 3 % (ref 0.0–7.0)
Eosinophils Absolute: 0.3 10*3/uL (ref 0.0–0.5)
HCT: 43.8 % (ref 34.8–46.6)
HGB: 14.5 g/dL (ref 11.6–15.9)
LYMPH%: 17.1 % (ref 14.0–49.7)
MCH: 27.5 pg (ref 25.1–34.0)
MCHC: 33.1 g/dL (ref 31.5–36.0)
MCV: 83.1 fL (ref 79.5–101.0)
MONO#: 0.6 10*3/uL (ref 0.1–0.9)
MONO%: 5.7 % (ref 0.0–14.0)
NEUT#: 7.3 10*3/uL — ABNORMAL HIGH (ref 1.5–6.5)
NEUT%: 73.5 % (ref 38.4–76.8)
Platelets: 515 10*3/uL — ABNORMAL HIGH (ref 145–400)
RBC: 5.27 10*6/uL (ref 3.70–5.45)
RDW: 15.3 % — ABNORMAL HIGH (ref 11.2–14.5)
WBC: 9.9 10*3/uL (ref 3.9–10.3)
lymph#: 1.7 10*3/uL (ref 0.9–3.3)
nRBC: 0 % (ref 0–0)

## 2013-03-18 LAB — POCT INR: INR: 3.2

## 2013-03-18 LAB — PROTIME-INR
INR: 3.2 (ref 2.00–3.50)
Protime: 38.4 Seconds — ABNORMAL HIGH (ref 10.6–13.4)

## 2013-03-18 NOTE — Progress Notes (Signed)
INR slightly above today at 3.2 Pt has been stable on current dose Pt reports doing well with no complaints No unusual bleeding or bruising. Pt states her hands are drying out and the skin is "cracking" some resulting in some light bleeding through the open cracks in the skin. This is normal for Gwendolyn Williams during the winter. No missed or extra doses No new medications or diet changes Gwendolyn Williams would feel most comfortable if we made a slight dose decrease, and I am in agreement with this. Plan: Slight change to coumadin 5mg  daily except 6mg  on Wed only Recheck INR in 3 weeks on 04/04/13; lab at 3:30 pm & Coumadin clinic at 3:45 pm

## 2013-03-18 NOTE — Patient Instructions (Signed)
INR slightly above  Slight change to coumadin 5mg  daily except 6mg  on Wed Recheck INR in 3 weeks on 04/04/13; lab at 3:30 pm & Coumadin clinic at 3:45 pm

## 2013-04-02 DIAGNOSIS — Z23 Encounter for immunization: Secondary | ICD-10-CM | POA: Diagnosis not present

## 2013-04-04 ENCOUNTER — Ambulatory Visit (HOSPITAL_BASED_OUTPATIENT_CLINIC_OR_DEPARTMENT_OTHER): Payer: Medicare Other | Admitting: Pharmacist

## 2013-04-04 ENCOUNTER — Other Ambulatory Visit: Payer: Medicare Other

## 2013-04-04 DIAGNOSIS — K55059 Acute (reversible) ischemia of intestine, part and extent unspecified: Secondary | ICD-10-CM | POA: Diagnosis not present

## 2013-04-04 DIAGNOSIS — K55069 Acute infarction of intestine, part and extent unspecified: Secondary | ICD-10-CM

## 2013-04-04 DIAGNOSIS — Z7901 Long term (current) use of anticoagulants: Secondary | ICD-10-CM | POA: Diagnosis not present

## 2013-04-04 DIAGNOSIS — I82409 Acute embolism and thrombosis of unspecified deep veins of unspecified lower extremity: Secondary | ICD-10-CM

## 2013-04-04 LAB — PROTIME-INR
INR: 1.9 — ABNORMAL LOW (ref 2.00–3.50)
Protime: 22.8 Seconds — ABNORMAL HIGH (ref 10.6–13.4)

## 2013-04-04 LAB — POCT INR: INR: 1.9

## 2013-04-04 NOTE — Progress Notes (Signed)
INR slightly below goal. No changes in diet. No s/s of clotting. No problems regarding anticoagulation. Pt took ~ 3 doses of Aleve since her last visit. She feels more comfortable with her INR closer to 3. Will make a slight change to coumadin dose.  Increase to 5mg  daily except 6mg  on Mon&Thu. Recheck INR in 2 weeks on 04/19/13; lab at 3:30 pm & Coumadin clinic at 3:45pm.  Coumadin 5mg  tablets provided (x 20 tablets). TKZ:[id number], Exp:6/16

## 2013-04-04 NOTE — Patient Instructions (Signed)
Slight change to coumadin dose.  Increase to 5mg  daily except 6mg  on Mon&Thu. Recheck INR in 2 weeks on 04/19/13; lab at 3:30 pm & Coumadin clinic at 3:45pm.

## 2013-04-19 ENCOUNTER — Other Ambulatory Visit (HOSPITAL_BASED_OUTPATIENT_CLINIC_OR_DEPARTMENT_OTHER): Payer: Medicare Other

## 2013-04-19 ENCOUNTER — Ambulatory Visit (HOSPITAL_BASED_OUTPATIENT_CLINIC_OR_DEPARTMENT_OTHER): Payer: Medicare Other | Admitting: Pharmacist

## 2013-04-19 DIAGNOSIS — Z7901 Long term (current) use of anticoagulants: Secondary | ICD-10-CM

## 2013-04-19 DIAGNOSIS — K55059 Acute (reversible) ischemia of intestine, part and extent unspecified: Secondary | ICD-10-CM

## 2013-04-19 DIAGNOSIS — K55069 Acute infarction of intestine, part and extent unspecified: Secondary | ICD-10-CM

## 2013-04-19 DIAGNOSIS — I82409 Acute embolism and thrombosis of unspecified deep veins of unspecified lower extremity: Secondary | ICD-10-CM

## 2013-04-19 LAB — PROTIME-INR
INR: 3.3 (ref 2.00–3.50)
Protime: 39.6 Seconds — ABNORMAL HIGH (ref 10.6–13.4)

## 2013-04-19 LAB — POCT INR: INR: 3.3

## 2013-04-19 NOTE — Progress Notes (Signed)
INR = 3.3 Pt on Coumadin 5 mg/day; 6 mg Mon/Thurs. Pt has tooth/bone erosion & is having a tooth extraction (extracting a tooth that supports her bridge) on 04/26/13 w/ Dr. Jerold Coombe (ph# [phone]) She says Dr. Dale [place] office is aware she is on Coumadin & they are comfortable w/ continuing the Coumadin throughout the procedure; just aware there may be more bleeding. She is on Clindamycin for her bone erosion.  She completes this tomorrow. No decreased PO intake from the pain. Her cataracts have caused HA for which she has used APAP 500 mg (no more than 2 tabs daily). INR above goal.  I am comfortable leaving her dose the same since she has been on short course of abx. I have d/w Dr. Beryle Beams & he would like pt to have INR drawn on 04/25/13 before her extraction on 04/26/13 to make sure her INR is within goal. Kennith Center, Pharm.D., CPP 04/19/2013@4 :36 PM

## 2013-04-25 ENCOUNTER — Other Ambulatory Visit (HOSPITAL_BASED_OUTPATIENT_CLINIC_OR_DEPARTMENT_OTHER): Payer: Medicare Other

## 2013-04-25 ENCOUNTER — Ambulatory Visit (HOSPITAL_BASED_OUTPATIENT_CLINIC_OR_DEPARTMENT_OTHER): Payer: Medicare Other | Admitting: Pharmacist

## 2013-04-25 DIAGNOSIS — Z7901 Long term (current) use of anticoagulants: Secondary | ICD-10-CM

## 2013-04-25 DIAGNOSIS — D47Z9 Other specified neoplasms of uncertain behavior of lymphoid, hematopoietic and related tissue: Secondary | ICD-10-CM

## 2013-04-25 DIAGNOSIS — I82409 Acute embolism and thrombosis of unspecified deep veins of unspecified lower extremity: Secondary | ICD-10-CM

## 2013-04-25 LAB — PROTIME-INR
INR: 2.7 (ref 2.00–3.50)
Protime: 32.4 Seconds — ABNORMAL HIGH (ref 10.6–13.4)

## 2013-04-25 LAB — POCT INR: INR: 2.7

## 2013-04-25 NOTE — Progress Notes (Signed)
Pt seen today to assess INR prior to dental extraction Down from 3.3 to 2.7 Dr. Darnell Level OK with level Pt will proceed with extraction She plans to discuss options with Dr. Darnell Level mon in her appmt with him She will meet with coumadin clinic pharmacist to schedule next appmt with [place] or about transferring her care with Dr. Darnell Level If wants to stay with clinic will need to schedule next appmt for approx a month from now

## 2013-04-25 NOTE — Patient Instructions (Signed)
INR dropped and Dr. Darnell Level ok with dental extractions tomorrow. F/U with cc on Mon after MD appt to discuss pt desires to follow him or stay with CC

## 2013-04-27 ENCOUNTER — Telehealth: Payer: Self-pay | Admitting: *Deleted

## 2013-04-27 NOTE — Telephone Encounter (Signed)
sw pt spouse informed him that G has death in the family. gv appt for 05/02/13 w/ labs@ 2:30pm and ov@ 2:45pm. Pt spouse is aware...td

## 2013-04-29 DIAGNOSIS — I129 Hypertensive chronic kidney disease with stage 1 through stage 4 chronic kidney disease, or unspecified chronic kidney disease: Secondary | ICD-10-CM | POA: Diagnosis not present

## 2013-04-29 DIAGNOSIS — E7800 Pure hypercholesterolemia, unspecified: Secondary | ICD-10-CM | POA: Diagnosis not present

## 2013-04-29 DIAGNOSIS — R51 Headache: Secondary | ICD-10-CM | POA: Diagnosis not present

## 2013-04-29 DIAGNOSIS — N183 Chronic kidney disease, stage 3 unspecified: Secondary | ICD-10-CM | POA: Diagnosis not present

## 2013-04-29 DIAGNOSIS — Z79899 Other long term (current) drug therapy: Secondary | ICD-10-CM | POA: Diagnosis not present

## 2013-04-29 DIAGNOSIS — K219 Gastro-esophageal reflux disease without esophagitis: Secondary | ICD-10-CM | POA: Diagnosis not present

## 2013-04-29 DIAGNOSIS — Z23 Encounter for immunization: Secondary | ICD-10-CM | POA: Diagnosis not present

## 2013-05-02 ENCOUNTER — Ambulatory Visit: Payer: Medicare Other | Admitting: Pharmacist

## 2013-05-02 ENCOUNTER — Other Ambulatory Visit (HOSPITAL_BASED_OUTPATIENT_CLINIC_OR_DEPARTMENT_OTHER): Payer: Medicare Other

## 2013-05-02 ENCOUNTER — Ambulatory Visit (HOSPITAL_BASED_OUTPATIENT_CLINIC_OR_DEPARTMENT_OTHER): Payer: BLUE CROSS/BLUE SHIELD | Admitting: Nurse Practitioner

## 2013-05-02 VITALS — BP 136/89 | HR 79 | Temp 97.0°F | Resp 19 | Ht 60.0 in | Wt 105.0 lb

## 2013-05-02 DIAGNOSIS — Z853 Personal history of malignant neoplasm of breast: Secondary | ICD-10-CM

## 2013-05-02 DIAGNOSIS — D47Z9 Other specified neoplasms of uncertain behavior of lymphoid, hematopoietic and related tissue: Secondary | ICD-10-CM

## 2013-05-02 DIAGNOSIS — C50919 Malignant neoplasm of unspecified site of unspecified female breast: Secondary | ICD-10-CM

## 2013-05-02 DIAGNOSIS — D471 Chronic myeloproliferative disease: Secondary | ICD-10-CM

## 2013-05-02 DIAGNOSIS — R51 Headache: Secondary | ICD-10-CM | POA: Diagnosis not present

## 2013-05-02 DIAGNOSIS — Z7901 Long term (current) use of anticoagulants: Secondary | ICD-10-CM

## 2013-05-02 DIAGNOSIS — I82409 Acute embolism and thrombosis of unspecified deep veins of unspecified lower extremity: Secondary | ICD-10-CM | POA: Diagnosis not present

## 2013-05-02 DIAGNOSIS — K55059 Acute (reversible) ischemia of intestine, part and extent unspecified: Secondary | ICD-10-CM | POA: Diagnosis not present

## 2013-05-02 DIAGNOSIS — I10 Essential (primary) hypertension: Secondary | ICD-10-CM

## 2013-05-02 DIAGNOSIS — Z5181 Encounter for therapeutic drug level monitoring: Secondary | ICD-10-CM | POA: Diagnosis not present

## 2013-05-02 DIAGNOSIS — R599 Enlarged lymph nodes, unspecified: Secondary | ICD-10-CM

## 2013-05-02 LAB — CBC WITH DIFFERENTIAL/PLATELET
BASO%: 0.5 % (ref 0.0–2.0)
Basophils Absolute: 0.1 10*3/uL (ref 0.0–0.1)
EOS%: 1.8 % (ref 0.0–7.0)
Eosinophils Absolute: 0.2 10*3/uL (ref 0.0–0.5)
HCT: 43.6 % (ref 34.8–46.6)
HGB: 14.4 g/dL (ref 11.6–15.9)
LYMPH%: 14.5 % (ref 14.0–49.7)
MCH: 27.6 pg (ref 25.1–34.0)
MCHC: 33 g/dL (ref 31.5–36.0)
MCV: 83.5 fL (ref 79.5–101.0)
MONO#: 0.6 10*3/uL (ref 0.1–0.9)
MONO%: 5.7 % (ref 0.0–14.0)
NEUT#: 8.4 10*3/uL — ABNORMAL HIGH (ref 1.5–6.5)
NEUT%: 77.5 % — ABNORMAL HIGH (ref 38.4–76.8)
Platelets: 482 10*3/uL — ABNORMAL HIGH (ref 145–400)
RBC: 5.22 10*6/uL (ref 3.70–5.45)
RDW: 15.6 % — ABNORMAL HIGH (ref 11.2–14.5)
WBC: 10.8 10*3/uL — ABNORMAL HIGH (ref 3.9–10.3)
lymph#: 1.6 10*3/uL (ref 0.9–3.3)
nRBC: 0 % (ref 0–0)

## 2013-05-02 LAB — PROTHROMBIN TIME
INR: 4.25 — ABNORMAL HIGH (ref <1.50)
Prothrombin Time: 39.2 seconds — ABNORMAL HIGH (ref 11.6–15.2)

## 2013-05-02 LAB — PROTIME-INR

## 2013-05-02 LAB — POCT INR: INR: 4.25

## 2013-05-02 NOTE — Patient Instructions (Signed)
INR above goal Hold coumadin today and tomorrow Recheck INR on 05/04/13; lab at 3:30 pm & Coumadin clinic at 3:45 pm

## 2013-05-02 NOTE — Progress Notes (Signed)
INR above goal today at 4.25 Pt seen by Ned Card Pt had tooth extraction on 1/27 with bleeding that continued through 1/31. Pt is doing better today but still has continued jaw pain and she is on pain relievers for this The bleeding from her tooth extraction has stopped INR above goal today due to inability to eat over the weekend due to jaw pain (pt only ate minimal amounts of ice cream and mash potatoes) Pt only took 4 mg on Saturday and Sunday in anticipation for an elevated INR from lack of food intake INR may continue to increase due to this Pt reports no other unusual bleeding or bruising Plan is: Hold coumadin today and tomorrow Recheck INR on 05/04/13; lab at 3:30 pm & Coumadin clinic at 3:45 pm to assess if coumadin can be restarted

## 2013-05-02 NOTE — Progress Notes (Signed)
OFFICE PROGRESS NOTE  Interval history:  Gwendolyn Williams is a 73 year old woman with a JAK 2 positive myeloproliferative disorder maintained on low-dose aspirin and Coumadin. She has a history of a mesenteric vascular thrombosis presenting with acute onset abdominal pain and hematochezia as the first sign of her myeloproliferative disorder in January 2010. She also has a history of stage I ER positive HER-2 negative cancer of the left breast diagnosed August 2010 status post lumpectomy, radiation, 4 cycles of Cytoxan and Taxotere and is currently on Arimidex.  She returns today for a 3 month visit for reexamination of the left axilla. At the time of her last visit with Dr. Beryle Beams on 02/07/2013 there was a questionable small subcentimeter lymph node intermittently palpable in the left axilla.  She has noted no change in the left axilla. She reports undergoing bilateral cataract surgery in December on 2 different days. Following the second surgery she developed "unremitting" headaches. She reports seeing the ophthalmologist twice since surgery with no definite source for the headaches identified. She describes the headache as "pressure" around the eyes and the frontal sinuses. The pain increases when she coughs, sneezes or bends over. She denies diplopia. She has had some nausea related to pain. No vomiting. No focal weakness. No confusion. No falls or balance problems. She reports seeing her primary physician last week and that a CT or MRI scan is being considered. She notes temporary relief of the headache pain following a dose of Tylenol.  She reports undergoing extraction of a tooth on 04/26/2013. She "had very little bleeding" at the time of the extraction. Beginning 04/29/2013 she developed bleeding from the extraction site. She was seen by the dentist on 04/29/2013, 04/30/2013 and 05/01/2013. The bleeding ceased following the appointment on 05/01/2013. She denies any other bleeding. Oral intake has  been very limited over the past several days. She is on clindamycin.   Objective: Filed Vitals:   05/02/13 1514  BP: 136/89  Pulse: 79  Temp: 97 F (36.1 C)  Resp: 19   Pupils equal round and reactive to light. Extraocular movements intact. Tooth extraction site at the right lower gumline is without bleeding. No palpable cervical, supraclavicular or right axillary lymph nodes. Small lymph node left axilla intermittently palpable on today's exam. Lungs clear. No wheezes or rales. Regular cardiac rhythm. Abdomen soft and nontender. No hepatomegaly. No leg edema. Calves nontender. Motor strength 5 over 5. Bicep and knee DTRs 2+, symmetric. Finger to nose and rapid alternating movement intact. Heel-to-shin intact. Gait normal. Alert and oriented. Cranial nerves II through XII intact. Some swelling in the region of the right jaw.   Lab Results: Lab Results  Component Value Date   WBC 10.8* 05/02/2013   HGB 14.4 05/02/2013   HCT 43.6 05/02/2013   MCV 83.5 05/02/2013   PLT 482* 05/02/2013   NEUTROABS 8.4* 05/02/2013    Chemistry:    Chemistry      Component Value Date/Time   NA 141 11/05/2012 1358   NA 138 08/04/2011 1537   K 4.0 11/05/2012 1358   K 3.7 08/04/2011 1537   CL 105 08/03/2012 1529   CL 103 08/04/2011 1537   CO2 28 11/05/2012 1358   CO2 24 08/04/2011 1537   BUN 25.0 11/05/2012 1358   BUN 16 08/04/2011 1537   CREATININE 1.0 11/05/2012 1358   CREATININE 0.88 08/04/2011 1537      Component Value Date/Time   CALCIUM 9.5 11/05/2012 1358   CALCIUM 9.7 08/04/2011 1537  ALKPHOS 46 11/05/2012 1358   ALKPHOS 49 08/04/2011 1537   AST 13 11/05/2012 1358   AST 17 08/04/2011 1537   ALT 13 11/05/2012 1358   ALT 16 08/04/2011 1537   BILITOT 0.47 11/05/2012 1358   BILITOT 0.4 08/04/2011 1537       Studies/Results: No results found.  Medications: I have reviewed the patient's current medications.  Assessment/Plan: 1. JAK 2 positive myeloproliferative disorder. She is maintained on low-dose aspirin and  Coumadin. 2. Status post mesenteric vascular thrombosis presenting with acute onset abdominal pain and hematochezia as the first sign of her myeloproliferative disorder in January 2010. 3. Stage I ER positive HER-2 negative cancer of the left breast diagnosed August 2010 status post lumpectomy, radiation, 4 cycles of Cytoxan and Taxotere chemotherapy, currently on Arimidex with plans to complete 5 years. 4. Hypertension. 5. Status post bilateral cataract surgery. 6. Persistent headaches following cataract surgery. 7. Status post recent tooth extraction with bleeding. The bleeding has resolved.   Dispositon-the PT/INR is supratherapeutic. She has been given instructions to hold Coumadin. She will return for a followup PT/INR on 05/04/2013.  Etiology of headaches is unclear. She plans to followup with her ophthalmologist as well as primary care provider. Her neurological exam is nonfocal. She and her husband were advised to seek emergency evaluation with onset of any neurologic-type symptoms.  She continues to have an intermittently palpable lymph node in the left axilla. A referral was made to Dr. Donne Hazel for further recommendations.    Plan was discussed with Dr. Benay Spice in Dr. Azucena Freed absence. She will return for a followup visit with Dr. Beryle Beams in approximately one month.  40 minutes were spent face-to-face at today's visit with the majority of that time involved in counseling/coordination of care.    Gwendolyn Williams ANP/GNP-BC

## 2013-05-03 ENCOUNTER — Telehealth: Payer: Self-pay | Admitting: Oncology

## 2013-05-03 NOTE — Telephone Encounter (Signed)
s.w. pt and advised on Dr. Donne Hazel appt on 2.24 @ 3pm and March appt...pt ok and aware

## 2013-05-04 ENCOUNTER — Other Ambulatory Visit (HOSPITAL_BASED_OUTPATIENT_CLINIC_OR_DEPARTMENT_OTHER): Payer: BLUE CROSS/BLUE SHIELD

## 2013-05-04 ENCOUNTER — Ambulatory Visit (HOSPITAL_BASED_OUTPATIENT_CLINIC_OR_DEPARTMENT_OTHER): Payer: Medicare Other | Admitting: Pharmacist

## 2013-05-04 ENCOUNTER — Other Ambulatory Visit: Payer: Self-pay | Admitting: Family Medicine

## 2013-05-04 DIAGNOSIS — I82409 Acute embolism and thrombosis of unspecified deep veins of unspecified lower extremity: Secondary | ICD-10-CM

## 2013-05-04 DIAGNOSIS — J329 Chronic sinusitis, unspecified: Secondary | ICD-10-CM

## 2013-05-04 DIAGNOSIS — K55059 Acute (reversible) ischemia of intestine, part and extent unspecified: Secondary | ICD-10-CM | POA: Diagnosis not present

## 2013-05-04 DIAGNOSIS — K55069 Acute infarction of intestine, part and extent unspecified: Secondary | ICD-10-CM

## 2013-05-04 DIAGNOSIS — R519 Headache, unspecified: Secondary | ICD-10-CM

## 2013-05-04 DIAGNOSIS — R51 Headache: Principal | ICD-10-CM

## 2013-05-04 LAB — PROTIME-INR
INR: 1.5 — ABNORMAL LOW (ref 2.00–3.50)
Protime: 18 Seconds — ABNORMAL HIGH (ref 10.6–13.4)

## 2013-05-04 LAB — POCT INR: INR: 1.5

## 2013-05-04 NOTE — Progress Notes (Addendum)
INR = 1.5 after holding Coumadin since 05/02/13 for supratherapeutic INR No further bleeding from gums (that stopped Sunday). Still on Clindamycin (at least until this coming Saturday) She is not able to eat well post tooth extraction.  She did eat a pancake yesterday & is trying to eat protein-- from eggs mainly; not able to chew meats. Persistent HA.  Having CT of head/sinuses tomorrow. INR subtherapeutic.  I have d/w Dr. Beryle Beams & we will resume Coumadin at 4 mg daily (she has 4 mg tabs at home as samples we gave her in the past).  No Lovenox. Recheck INR Mon 05/09/13.  Samples: Coumadin 5 mg x 60 tabs (lot #[id number]; exp 11/2014) Kennith Center, Pharm.D., CPP 05/04/2013@4 :19 PM

## 2013-05-05 ENCOUNTER — Ambulatory Visit
Admission: RE | Admit: 2013-05-05 | Discharge: 2013-05-05 | Disposition: A | Discharge status: Home / Self Care | Payer: Medicare Other | Source: Ambulatory Visit | Attending: Family Medicine | Admitting: Family Medicine

## 2013-05-05 ENCOUNTER — Ambulatory Visit: Payer: Medicare Other

## 2013-05-05 DIAGNOSIS — R519 Headache, unspecified: Secondary | ICD-10-CM

## 2013-05-05 DIAGNOSIS — J329 Chronic sinusitis, unspecified: Secondary | ICD-10-CM

## 2013-05-05 DIAGNOSIS — R51 Headache: Principal | ICD-10-CM

## 2013-05-05 DIAGNOSIS — J3489 Other specified disorders of nose and nasal sinuses: Secondary | ICD-10-CM | POA: Diagnosis not present

## 2013-05-09 ENCOUNTER — Ambulatory Visit (HOSPITAL_BASED_OUTPATIENT_CLINIC_OR_DEPARTMENT_OTHER): Payer: Medicare Other | Admitting: Pharmacist

## 2013-05-09 ENCOUNTER — Other Ambulatory Visit (HOSPITAL_BASED_OUTPATIENT_CLINIC_OR_DEPARTMENT_OTHER): Payer: Medicare Other

## 2013-05-09 DIAGNOSIS — C50919 Malignant neoplasm of unspecified site of unspecified female breast: Secondary | ICD-10-CM | POA: Diagnosis not present

## 2013-05-09 DIAGNOSIS — K55059 Acute (reversible) ischemia of intestine, part and extent unspecified: Secondary | ICD-10-CM

## 2013-05-09 DIAGNOSIS — D47Z9 Other specified neoplasms of uncertain behavior of lymphoid, hematopoietic and related tissue: Secondary | ICD-10-CM | POA: Diagnosis not present

## 2013-05-09 DIAGNOSIS — D471 Chronic myeloproliferative disease: Secondary | ICD-10-CM

## 2013-05-09 DIAGNOSIS — Z853 Personal history of malignant neoplasm of breast: Secondary | ICD-10-CM

## 2013-05-09 DIAGNOSIS — Z7901 Long term (current) use of anticoagulants: Secondary | ICD-10-CM

## 2013-05-09 DIAGNOSIS — I82409 Acute embolism and thrombosis of unspecified deep veins of unspecified lower extremity: Secondary | ICD-10-CM

## 2013-05-09 DIAGNOSIS — K55069 Acute infarction of intestine, part and extent unspecified: Secondary | ICD-10-CM

## 2013-05-09 LAB — POCT INR: INR: 1.8

## 2013-05-09 LAB — COMPREHENSIVE METABOLIC PANEL (CC13)
ALT: 15 U/L (ref 0–55)
AST: 16 U/L (ref 5–34)
Albumin: 3.9 g/dL (ref 3.5–5.0)
Alkaline Phosphatase: 42 U/L (ref 40–150)
Anion Gap: 10 mEq/L (ref 3–11)
BUN: 28 mg/dL — ABNORMAL HIGH (ref 7.0–26.0)
CO2: 29 mEq/L (ref 22–29)
Calcium: 10.2 mg/dL (ref 8.4–10.4)
Chloride: 104 mEq/L (ref 98–109)
Creatinine: 1 mg/dL (ref 0.6–1.1)
Glucose: 75 mg/dl (ref 70–140)
Potassium: 3.6 mEq/L (ref 3.5–5.1)
Sodium: 142 mEq/L (ref 136–145)
Total Bilirubin: 0.43 mg/dL (ref 0.20–1.20)
Total Protein: 6.7 g/dL (ref 6.4–8.3)

## 2013-05-09 LAB — LACTATE DEHYDROGENASE (CC13): LDH: 249 U/L — ABNORMAL HIGH (ref 125–245)

## 2013-05-09 LAB — PROTIME-INR
INR: 1.8 — ABNORMAL LOW (ref 2.00–3.50)
Protime: 21.6 s — ABNORMAL HIGH (ref 10.6–13.4)

## 2013-05-09 NOTE — Progress Notes (Addendum)
INR just below goal today and up to 1.8 from 1.5 Pt is doing well with improvements in appetite and pain after recent dental extraction Ms. Holderman last day of clindamycin is today and she is decreasing her use of acetaminophen for her pain Pt reports no issues with unusual bleeding or bruising She is feeling much better She has been taking 4 mg daily of coumadin Now that her appetite is improving and clindamycin has stopped with slightly increase her dose (she was previously therapeutic on 5 mg daily except for 6 mg on Mondays and Thursdays) But, with Ms. Hartung scheduled for possible bridge procedure with her dentist on Thursday 05/12/13 will be cautious with dose increase. Plan: Change to 4mg  every other day alternating with 5 mg (starting today 05/09/13 with 4 mg). Recheck INR on 05/16/13; lab at 1pm & Coumadin clinic at 1:15pm  Samples provided:  Coumadin 4 mg x 10 tabs LOT: [id number] Exp: Oct 2015

## 2013-05-09 NOTE — Patient Instructions (Addendum)
INR just below goal Change to 4mg  every other day alternating with 5 mg. Recheck INR on 05/16/13; lab at 1pm & Coumadin clinic at 1:15pm

## 2013-05-16 ENCOUNTER — Ambulatory Visit (HOSPITAL_BASED_OUTPATIENT_CLINIC_OR_DEPARTMENT_OTHER): Payer: Medicare Other

## 2013-05-16 ENCOUNTER — Ambulatory Visit (HOSPITAL_BASED_OUTPATIENT_CLINIC_OR_DEPARTMENT_OTHER): Payer: Medicare Other | Admitting: Pharmacist

## 2013-05-16 DIAGNOSIS — K55059 Acute (reversible) ischemia of intestine, part and extent unspecified: Secondary | ICD-10-CM

## 2013-05-16 DIAGNOSIS — I82409 Acute embolism and thrombosis of unspecified deep veins of unspecified lower extremity: Secondary | ICD-10-CM

## 2013-05-16 DIAGNOSIS — K55069 Acute infarction of intestine, part and extent unspecified: Secondary | ICD-10-CM

## 2013-05-16 LAB — PROTIME-INR
INR: 2.6 (ref 2.00–3.50)
Protime: 31.2 Seconds — ABNORMAL HIGH (ref 10.6–13.4)

## 2013-05-16 LAB — POCT INR: INR: 2.6

## 2013-05-16 NOTE — Progress Notes (Signed)
INR = 2.6 on Coumadin 4 mg/5 mg alt Eating is more normal now.  Pain is still present in R side of her mouth where she just had bridge placed but is overall improved. HA a little better yesterday; sinus CT negative.  She has appt w/ opthalmologist this Friday. INR at goal.  I will keep pt on same dose of Coumadin w/o adjustments. Return in 10 days & if INR still stable then we can see her in 2-3 weeks. Samples: 4 mg x 20 tabs (lot #[id number]; exp 12/2013) Kennith Center, Pharm.D., CPP 05/16/2013@1 :21 PM

## 2013-05-20 DIAGNOSIS — H20019 Primary iridocyclitis, unspecified eye: Secondary | ICD-10-CM | POA: Diagnosis not present

## 2013-05-24 ENCOUNTER — Encounter (INDEPENDENT_AMBULATORY_CARE_PROVIDER_SITE_OTHER): Payer: Self-pay | Admitting: General Surgery

## 2013-05-24 ENCOUNTER — Ambulatory Visit (INDEPENDENT_AMBULATORY_CARE_PROVIDER_SITE_OTHER): Payer: Medicare Other | Admitting: General Surgery

## 2013-05-24 VITALS — BP 122/62 | HR 70 | Resp 16 | Ht 60.0 in | Wt 110.0 lb

## 2013-05-24 DIAGNOSIS — N644 Mastodynia: Secondary | ICD-10-CM

## 2013-05-24 NOTE — Progress Notes (Signed)
Subjective:     Patient ID: Gwendolyn Williams, female   DOB: 07-11-1940, 73 y.o.   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 She was treated with a left breast lumpectomy as well as a sentinel node biopsy. She underwent 4 cycles of TC and then underwent radiation therapy as well. She has been doing well.  Several months ago she was thought to possibly have left axillary adenopathy but was not sure.  She then followed up recently at the cancer center and was sent for evaluation for possible left axillary lad.  She does not really note any abnormality.  She has some breast tenderness but is otherwise well.She has a mm in Oct 2014 which is benign. Review of Systems     Objective:   Physical Exam  Constitutional: She appears well-developed and well-nourished.  Pulmonary/Chest: Right breast exhibits no inverted nipple, no mass, no nipple discharge, no skin change and no tenderness. Left breast exhibits skin change (c/w radiation therapy) and tenderness (axillary tail and left lower outer quadrant but no discrete mass). Left breast exhibits no inverted nipple, no mass and no nipple discharge.  Lymphadenopathy:    She has no cervical adenopathy.    She has no axillary adenopathy.       Right: No supraclavicular adenopathy present.       Left: No supraclavicular adenopathy present.       Assessment:   Breast cancer    Plan:     I cannot really identify anything abnormal in axilla today.  She does have a couple tender areas with a difficult exam due to postop hematoma on anticoagulation and xrt.  I will send her for u/s of her left axilla as well as area in axillary tail that is tender and in left lower outer quadrant.  We will follow up after that.

## 2013-05-26 ENCOUNTER — Ambulatory Visit: Payer: Medicare Other

## 2013-05-26 ENCOUNTER — Other Ambulatory Visit: Payer: Medicare Other

## 2013-05-27 ENCOUNTER — Other Ambulatory Visit (HOSPITAL_BASED_OUTPATIENT_CLINIC_OR_DEPARTMENT_OTHER): Payer: Medicare Other

## 2013-05-27 ENCOUNTER — Ambulatory Visit (HOSPITAL_BASED_OUTPATIENT_CLINIC_OR_DEPARTMENT_OTHER): Payer: Medicare Other | Admitting: Pharmacist

## 2013-05-27 DIAGNOSIS — K55059 Acute (reversible) ischemia of intestine, part and extent unspecified: Secondary | ICD-10-CM

## 2013-05-27 DIAGNOSIS — Z7901 Long term (current) use of anticoagulants: Secondary | ICD-10-CM

## 2013-05-27 DIAGNOSIS — K55069 Acute infarction of intestine, part and extent unspecified: Secondary | ICD-10-CM

## 2013-05-27 DIAGNOSIS — I82409 Acute embolism and thrombosis of unspecified deep veins of unspecified lower extremity: Secondary | ICD-10-CM

## 2013-05-27 LAB — POCT INR: INR: 3.5

## 2013-05-27 LAB — PROTIME-INR
INR: 3.5 (ref 2.00–3.50)
Protime: 42 Seconds — ABNORMAL HIGH (ref 10.6–13.4)

## 2013-05-27 NOTE — Progress Notes (Signed)
INR = 3.5 on Coumadin 4 mg/5 mg alt. Med change: on prednisolone eye gtts.  She is taking QID this week & then decrease to BID next week.  She completes the taper next week.  Perhaps there is a degree of systemic absorption??  I advised pt to pinch in the corners of her eyes to keep systemic absorption minimized. HA still mild. INR elevated.  Take 2 mg today then decrease to 4 mg daily except 5 mg on Mon/Wed. Repeat INR on 06/06/13 when she is to see Dr. Beryle Beams. Kennith Center, Pharm.D., CPP 05/27/2013@4 :25 PM

## 2013-05-30 ENCOUNTER — Encounter: Payer: Self-pay | Admitting: Oncology

## 2013-05-31 DIAGNOSIS — Z803 Family history of malignant neoplasm of breast: Secondary | ICD-10-CM | POA: Diagnosis not present

## 2013-05-31 DIAGNOSIS — Z853 Personal history of malignant neoplasm of breast: Secondary | ICD-10-CM | POA: Diagnosis not present

## 2013-06-03 ENCOUNTER — Telehealth (INDEPENDENT_AMBULATORY_CARE_PROVIDER_SITE_OTHER): Payer: Self-pay

## 2013-06-03 NOTE — Telephone Encounter (Signed)
Pt notified breast u/s normal per Dr Donne Hazel.

## 2013-06-06 ENCOUNTER — Ambulatory Visit (HOSPITAL_BASED_OUTPATIENT_CLINIC_OR_DEPARTMENT_OTHER): Payer: Self-pay | Admitting: Pharmacist

## 2013-06-06 ENCOUNTER — Other Ambulatory Visit (HOSPITAL_BASED_OUTPATIENT_CLINIC_OR_DEPARTMENT_OTHER): Payer: Medicare Other

## 2013-06-06 ENCOUNTER — Encounter: Payer: Self-pay | Admitting: Oncology

## 2013-06-06 ENCOUNTER — Ambulatory Visit (HOSPITAL_BASED_OUTPATIENT_CLINIC_OR_DEPARTMENT_OTHER): Payer: Medicare Other | Admitting: Oncology

## 2013-06-06 VITALS — BP 117/78 | HR 69 | Temp 97.7°F | Resp 18 | Ht 60.0 in | Wt 109.6 lb

## 2013-06-06 DIAGNOSIS — Z86718 Personal history of other venous thrombosis and embolism: Secondary | ICD-10-CM | POA: Diagnosis not present

## 2013-06-06 DIAGNOSIS — Z7901 Long term (current) use of anticoagulants: Secondary | ICD-10-CM

## 2013-06-06 DIAGNOSIS — D471 Chronic myeloproliferative disease: Secondary | ICD-10-CM

## 2013-06-06 DIAGNOSIS — Z853 Personal history of malignant neoplasm of breast: Secondary | ICD-10-CM

## 2013-06-06 DIAGNOSIS — D47Z9 Other specified neoplasms of uncertain behavior of lymphoid, hematopoietic and related tissue: Secondary | ICD-10-CM

## 2013-06-06 DIAGNOSIS — I10 Essential (primary) hypertension: Secondary | ICD-10-CM

## 2013-06-06 DIAGNOSIS — Z7982 Long term (current) use of aspirin: Secondary | ICD-10-CM

## 2013-06-06 DIAGNOSIS — K55069 Acute infarction of intestine, part and extent unspecified: Secondary | ICD-10-CM

## 2013-06-06 DIAGNOSIS — C50919 Malignant neoplasm of unspecified site of unspecified female breast: Secondary | ICD-10-CM | POA: Diagnosis not present

## 2013-06-06 LAB — CBC WITH DIFFERENTIAL/PLATELET
BASO%: 0.8 % (ref 0.0–2.0)
Basophils Absolute: 0.1 10*3/uL (ref 0.0–0.1)
EOS%: 2.1 % (ref 0.0–7.0)
Eosinophils Absolute: 0.2 10*3/uL (ref 0.0–0.5)
HCT: 40.2 % (ref 34.8–46.6)
HGB: 13.2 g/dL (ref 11.6–15.9)
LYMPH%: 15.6 % (ref 14.0–49.7)
MCH: 27.6 pg (ref 25.1–34.0)
MCHC: 32.8 g/dL (ref 31.5–36.0)
MCV: 83.9 fL (ref 79.5–101.0)
MONO#: 0.5 10*3/uL (ref 0.1–0.9)
MONO%: 5.8 % (ref 0.0–14.0)
NEUT#: 6.4 10*3/uL (ref 1.5–6.5)
NEUT%: 75.7 % (ref 38.4–76.8)
Platelets: 381 10*3/uL (ref 145–400)
RBC: 4.79 10*6/uL (ref 3.70–5.45)
RDW: 16.3 % — ABNORMAL HIGH (ref 11.2–14.5)
WBC: 8.5 10*3/uL (ref 3.9–10.3)
lymph#: 1.3 10*3/uL (ref 0.9–3.3)
nRBC: 0 % (ref 0–0)

## 2013-06-06 LAB — PROTIME-INR
INR: 2.4 (ref 2.00–3.50)
Protime: 28.8 Seconds — ABNORMAL HIGH (ref 10.6–13.4)

## 2013-06-06 LAB — POCT INR: INR: 2.4

## 2013-06-06 NOTE — Patient Instructions (Addendum)
Continue 4mg  daily except 5mg  on Mon&Wed.   Recheck INR on 06/21/13; lab at 3:30pm and Coumadin clinic at 3:45pm.

## 2013-06-06 NOTE — Progress Notes (Signed)
INR within goal today. CBC is wnl. Pt took coumadin as instructed at last visit. No concerns regarding anticoagulation. She is decreasing her intake of Tylenol. She has had 1 or 2 doses in the last 3 days. She finished her eye drops prescription. She is still experiencing headaches, believed to be related to cataracts. Recent breast ultrasound was negative. Wil continue 4 mg daily except 5 mg on Mon&Wed.   Recheck INR on 06/21/13; lab at 3:30pm and Coumadin clinic at 3:45pm

## 2013-06-07 ENCOUNTER — Telehealth: Payer: Self-pay | Admitting: Oncology

## 2013-06-07 NOTE — Telephone Encounter (Signed)
pt made aware yesterday that she will be contacted re appt @ [facility]. message to [place] to clarify standing lab orders mentioned on 3/9 pof.

## 2013-06-07 NOTE — Progress Notes (Signed)
Hematology and Oncology Follow Up Visit  MARIO VOONG [id number] 1941-03-29 73 y.o. 06/07/2013 7:57 PM   Principle Diagnosis: Encounter Diagnoses  Name Primary?  . Myeloproliferative disorder Yes  . History of breast cancer in female   . 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 She had an elevated platelet count which initially was felt to be due to inflammation associated with the bowel infarction but which did not resolve. JAK-2 gene analysis study was positive. She was started on anticoagulation with Coumadin and aspirin.  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 High risk Oncotype Dx score. Treated with lumpectomy 12/26/2008 followed by adjuvant chemotherapy with 4 cycles of Taxotere plus Cytoxan between 02/16/2009 and 04/20/2009. She then had a course of radiation between February 14 and 07/02/2009. She was started on hormonal therapy with Arimidex which she continues at this time. BRCA1 and BRCA2 gene testing was negative.  She developed a large chest wall hematoma at time of the surgery related to tight anticoagulation.  At time of her visit with me in November 2014, I palpated a enlarged lymph gland in her left axilla. I referred her to her surgeon Dr. Donne Hazel. On his exam and subsequent ultrasound of the axilla this gland was felt to be benign.   She continues to have persistent frontal pressure since bilateral cataract surgery in December 2014. Her ophthalmologist does not feel this was related to the cataract surgery although she never had these symptoms prior to that surgery. She went online to look for complications of cataract surgery and her symptoms have been reported by other  patients. She had a CT scan of her sinuses on 05/05/2013 which did not show any significant sinusitis. There are moderate degenerative changes in her cervical spine.   Medications: reviewed  Allergies:  Allergies  Allergen Reactions  . Penicillins   . Sulfa Antibiotics     Unable to recall    Review of Systems: Hematology:  No bleeding or bruising ENT ROS: No sore throat Breast ROS: No new breast lumps Respiratory ROS: No cough or dyspnea Cardiovascular ROS:  No chest pain or palpitations Gastrointestinal ROS: No abdominal pain no change in bowel habit    Genito-Urinary ROS: No urinary tract symptoms, Musculoskeletal ROS: No muscle bone or joint pain Neurological ROS: No headache or change in vision Dermatological ROS: No rash Remaining ROS negative:   Physical Exam: Blood pressure 117/78, pulse 69, temperature 97.7 F (36.5 C), temperature source Oral, resp. rate 18, height 5' (1.524 m), weight 109 lb 9.6 oz (49.714 kg), SpO2 97.00%. Wt Readings from Last 3 Encounters:  06/06/13 109 lb 9.6 oz (49.714 kg)  05/24/13 110 lb (49.896 kg)  05/02/13 105 lb (47.628 kg)     General appearance: Petite Caucasian woman [name]: Pharynx no erythema, exudate, mass, or ulcer. No thyromegaly or thyroid nodules Lymph nodes: No cervical, supraclavicular, lymphadenopathy. Small, deep, approximately 1 cm lymph node left axilla less prominent than on my previous exams. Breasts: Significant Surgical changes left breast. No dominant mass in either breast. Lungs: Clear to auscultation, resonant to percussion throughout Heart: Regular rhythm, no murmur, no gallop, no rub, no click, no edema Abdomen: Soft, nontender, normal bowel sounds, no mass, question spleen tip Extremities: No edema, no  calf tenderness Musculoskeletal: no joint deformities GU:  Vascular: Carotid pulses 2+, no bruits, distal pulses: Dorsalis pedis 1+ symmetric Neurologic: Alert, oriented, PERRLA, optic discs sharp and vessels  normal on the left. Disc not well visualized on the right., no hemorrhage or exudate, cranial nerves grossly normal, motor strength 5 over 5, reflexes 1+ symmetric, upper body coordination normal, gait normal, Skin: No rash or ecchymosis  Lab Results: CBC W/Diff    Component Value Date/Time   WBC 8.5 06/06/2013 1546   WBC 3.7* 09/18/2009 0457   RBC 4.79 06/06/2013 1546   RBC 3.92 09/18/2009 0457   HGB 13.2 06/06/2013 1546   HGB 9.1* 09/18/2009 0457   HCT 40.2 06/06/2013 1546   HCT 29.2* 09/18/2009 0457   PLT 381 06/06/2013 1546   PLT 254 09/18/2009 0457   MCV 83.9 06/06/2013 1546   MCV 74.7* 09/18/2009 0457   MCH 27.6 06/06/2013 1546   MCH 24.8* 01/08/2010 1506   MCHC 32.8 06/06/2013 1546   MCHC 31.0 09/18/2009 0457   RDW 16.3* 06/06/2013 1546   RDW 20.2* 09/18/2009 0457   LYMPHSABS 1.3 06/06/2013 1546   LYMPHSABS 1.0 09/06/2009 1400   MONOABS 0.5 06/06/2013 1546   MONOABS 0.3 09/06/2009 1400   EOSABS 0.2 06/06/2013 1546   EOSABS 0.1 09/06/2009 1400   BASOSABS 0.1 06/06/2013 1546   BASOSABS 0.0 09/06/2009 1400     Chemistry      Component Value Date/Time   NA 142 05/09/2013 0816   NA 138 08/04/2011 1537   K 3.6 05/09/2013 0816   K 3.7 08/04/2011 1537   CL 105 08/03/2012 1529   CL 103 08/04/2011 1537   CO2 29 05/09/2013 0816   CO2 24 08/04/2011 1537   BUN 28.0* 05/09/2013 0816   BUN 16 08/04/2011 1537   CREATININE 1.0 05/09/2013 0816   CREATININE 0.88 08/04/2011 1537      Component Value Date/Time   CALCIUM 10.2 05/09/2013 0816   CALCIUM 9.7 08/04/2011 1537   ALKPHOS 42 05/09/2013 0816   ALKPHOS 49 08/04/2011 1537   AST 16 05/09/2013 0816   AST 17 08/04/2011 1537   ALT 15 05/09/2013 0816   ALT 16 08/04/2011 1537   BILITOT 0.43 05/09/2013 0816   BILITOT 0.4 08/04/2011 1537       Radiological Studies: Most recent mammogram 12/31/2012 with no new disease. .  Impression:  #1. JAK2-positive myeloproliferative disorder. Stable on low-dose aspirin and Coumadin. Continue the same.   #2. Status post mesenteric vascular thrombosis presenting  with acute onset abdominal pain and hematochezia as first sign of her myeloproliferative disorder in January 2010.   #3. Stage I ER-positive HER-2 negative cancer of the left breast diagnosed August 2010, treated with lumpectomy, radiation, followed by 4 cycles of Cytoxan and Taxotere chemotherapy and currently on Arimidex hormonal therapy. Continue the Arimidex to complete 5 years of treatment.  .  #4. Essential hypertension  #5. Atypical frontal pressure which started coincident with bilateral cataract surgery in December 2014. She may seek a second opinion.  She will follow me to my hematology clinic at [facility].    CC: Patient Care Team: Gaynelle Arabian, MD as PCP - General (Family Medicine) Annia Belt, MD as Consulting Physician (Oncology) Rolm Bookbinder, MD as Consulting Physician (General Surgery)   Annia Belt, MD 3/10/20157:57 PM

## 2013-06-08 DIAGNOSIS — H20019 Primary iridocyclitis, unspecified eye: Secondary | ICD-10-CM | POA: Diagnosis not present

## 2013-06-09 ENCOUNTER — Telehealth: Payer: Self-pay | Admitting: Oncology

## 2013-06-09 NOTE — Telephone Encounter (Signed)
per staff message response from [place] the only standing lb is what pt is doing @ [facility]. pt will f/u JG in spet and continue lb/CC @ [place]  monitored by [facility]. pt aware.

## 2013-06-17 ENCOUNTER — Other Ambulatory Visit: Payer: Self-pay | Admitting: Family Medicine

## 2013-06-17 DIAGNOSIS — R519 Headache, unspecified: Secondary | ICD-10-CM

## 2013-06-17 DIAGNOSIS — R51 Headache: Principal | ICD-10-CM

## 2013-06-20 ENCOUNTER — Encounter: Payer: Self-pay | Admitting: Oncology

## 2013-06-21 ENCOUNTER — Ambulatory Visit (HOSPITAL_BASED_OUTPATIENT_CLINIC_OR_DEPARTMENT_OTHER): Payer: Medicare Other | Admitting: Pharmacist

## 2013-06-21 ENCOUNTER — Other Ambulatory Visit (HOSPITAL_BASED_OUTPATIENT_CLINIC_OR_DEPARTMENT_OTHER): Payer: Medicare Other

## 2013-06-21 DIAGNOSIS — K55059 Acute (reversible) ischemia of intestine, part and extent unspecified: Secondary | ICD-10-CM

## 2013-06-21 DIAGNOSIS — I82409 Acute embolism and thrombosis of unspecified deep veins of unspecified lower extremity: Secondary | ICD-10-CM

## 2013-06-21 DIAGNOSIS — K55069 Acute infarction of intestine, part and extent unspecified: Secondary | ICD-10-CM

## 2013-06-21 LAB — PROTIME-INR
INR: 1.5 — ABNORMAL LOW (ref 2.00–3.50)
Protime: 18 Seconds — ABNORMAL HIGH (ref 10.6–13.4)

## 2013-06-21 LAB — POCT INR: INR: 1.5

## 2013-06-21 NOTE — Progress Notes (Signed)
INR below goal Pt is doing well today with no complaints No missed or extra doses Minor bruise on left calf area that is healing. Other than this no unusual bleeding or bruising No diet or medication changes Pt has MRI scheduled tomorrow Plan:  Increase to 5 mg daily except for 4 mg on Mondays, Wednesday and Fridays   Recheck INR on 06/28/13; lab at 3:30pm and Coumadin clinic at 3:45pm.  Samples provided:  Coumadin 4 mg x 10 tabs [id number] Exp: 12/2013

## 2013-06-21 NOTE — Patient Instructions (Signed)
INR below goal Increase to 5 mg daily except for 4 mg on Mondays, Wednesday and Fridays   Recheck INR on 06/28/13; lab at 3:30pm and Coumadin clinic at 3:45pm.

## 2013-06-22 ENCOUNTER — Ambulatory Visit
Admission: RE | Admit: 2013-06-22 | Discharge: 2013-06-22 | Disposition: A | Discharge status: Home / Self Care | Payer: Medicare Other | Source: Ambulatory Visit | Attending: Family Medicine | Admitting: Family Medicine

## 2013-06-22 DIAGNOSIS — R519 Headache, unspecified: Secondary | ICD-10-CM

## 2013-06-22 DIAGNOSIS — R51 Headache: Secondary | ICD-10-CM | POA: Diagnosis not present

## 2013-06-22 DIAGNOSIS — Z853 Personal history of malignant neoplasm of breast: Secondary | ICD-10-CM | POA: Diagnosis not present

## 2013-06-28 ENCOUNTER — Other Ambulatory Visit (HOSPITAL_BASED_OUTPATIENT_CLINIC_OR_DEPARTMENT_OTHER): Payer: Medicare Other

## 2013-06-28 ENCOUNTER — Ambulatory Visit (HOSPITAL_BASED_OUTPATIENT_CLINIC_OR_DEPARTMENT_OTHER): Payer: Medicare Other | Admitting: Pharmacist

## 2013-06-28 DIAGNOSIS — K55059 Acute (reversible) ischemia of intestine, part and extent unspecified: Secondary | ICD-10-CM

## 2013-06-28 DIAGNOSIS — K55069 Acute infarction of intestine, part and extent unspecified: Secondary | ICD-10-CM

## 2013-06-28 DIAGNOSIS — I82409 Acute embolism and thrombosis of unspecified deep veins of unspecified lower extremity: Secondary | ICD-10-CM

## 2013-06-28 LAB — PROTIME-INR
INR: 2.6 (ref 2.00–3.50)
Protime: 31.2 Seconds — ABNORMAL HIGH (ref 10.6–13.4)

## 2013-06-28 LAB — POCT INR: INR: 2.6

## 2013-06-28 NOTE — Progress Notes (Signed)
INR at goal of 2-3 after increasing coumadin dose last week.  No changes in meds.  No bleeding/bruising.  Will continue current coumadin dose of 4mg  MWF and 5mg  other days.  Will check PT/INR in 10 days to ensure INR remains at goal.

## 2013-07-08 ENCOUNTER — Ambulatory Visit (HOSPITAL_BASED_OUTPATIENT_CLINIC_OR_DEPARTMENT_OTHER): Payer: Medicare Other | Admitting: Pharmacist

## 2013-07-08 ENCOUNTER — Other Ambulatory Visit (HOSPITAL_BASED_OUTPATIENT_CLINIC_OR_DEPARTMENT_OTHER): Payer: Medicare Other

## 2013-07-08 DIAGNOSIS — K55059 Acute (reversible) ischemia of intestine, part and extent unspecified: Secondary | ICD-10-CM

## 2013-07-08 DIAGNOSIS — I82409 Acute embolism and thrombosis of unspecified deep veins of unspecified lower extremity: Secondary | ICD-10-CM

## 2013-07-08 DIAGNOSIS — Z7901 Long term (current) use of anticoagulants: Secondary | ICD-10-CM

## 2013-07-08 DIAGNOSIS — D471 Chronic myeloproliferative disease: Secondary | ICD-10-CM

## 2013-07-08 DIAGNOSIS — Z853 Personal history of malignant neoplasm of breast: Secondary | ICD-10-CM

## 2013-07-08 DIAGNOSIS — K55069 Acute infarction of intestine, part and extent unspecified: Secondary | ICD-10-CM

## 2013-07-08 LAB — CBC WITH DIFFERENTIAL/PLATELET
BASO%: 1.1 % (ref 0.0–2.0)
Basophils Absolute: 0.1 10*3/uL (ref 0.0–0.1)
EOS%: 2.5 % (ref 0.0–7.0)
Eosinophils Absolute: 0.2 10*3/uL (ref 0.0–0.5)
HCT: 42.5 % (ref 34.8–46.6)
HGB: 14.1 g/dL (ref 11.6–15.9)
LYMPH%: 17.6 % (ref 14.0–49.7)
MCH: 27.6 pg (ref 25.1–34.0)
MCHC: 33.2 g/dL (ref 31.5–36.0)
MCV: 83.3 fL (ref 79.5–101.0)
MONO#: 0.6 10*3/uL (ref 0.1–0.9)
MONO%: 6.4 % (ref 0.0–14.0)
NEUT#: 6.3 10*3/uL (ref 1.5–6.5)
NEUT%: 72.4 % (ref 38.4–76.8)
Platelets: 441 10*3/uL — ABNORMAL HIGH (ref 145–400)
RBC: 5.1 10*6/uL (ref 3.70–5.45)
RDW: 15.2 % — ABNORMAL HIGH (ref 11.2–14.5)
WBC: 8.7 10*3/uL (ref 3.9–10.3)
lymph#: 1.5 10*3/uL (ref 0.9–3.3)
nRBC: 0 % (ref 0–0)

## 2013-07-08 LAB — PROTIME-INR
INR: 2.1 (ref 2.00–3.50)
Protime: 25.2 Seconds — ABNORMAL HIGH (ref 10.6–13.4)

## 2013-07-08 LAB — POCT INR: INR: 2.1

## 2013-07-08 NOTE — Patient Instructions (Signed)
INR at goal but on the low end Slight change to Coumadin  5 mg daily except for 4 mg on Mondays and Wednesday.   Recheck INR on 07/22/13; lab at 3:30pm and Coumadin clinic at 3:45pm.

## 2013-07-08 NOTE — Progress Notes (Signed)
INR at goal today at 2.1 but down slightly from 2.6 at last visit Pt is feeling well with no complaints No unusual bleeding or bruising No missed or extra doses No diet or medication changes Pt prefers to have her INR around 2.5 or higher. Will make small dose increase Plan: Slight change to Coumadin  5 mg daily except for 4 mg on Mondays and Wednesday.  Recheck INR on 07/22/13; lab at 3:30pm and Coumadin clinic at 3:45pm.

## 2013-07-22 ENCOUNTER — Encounter (INDEPENDENT_AMBULATORY_CARE_PROVIDER_SITE_OTHER): Payer: Self-pay

## 2013-07-22 ENCOUNTER — Other Ambulatory Visit (HOSPITAL_BASED_OUTPATIENT_CLINIC_OR_DEPARTMENT_OTHER): Payer: Medicare Other

## 2013-07-22 ENCOUNTER — Ambulatory Visit (HOSPITAL_BASED_OUTPATIENT_CLINIC_OR_DEPARTMENT_OTHER): Payer: Medicare Other | Admitting: Pharmacist

## 2013-07-22 DIAGNOSIS — K55059 Acute (reversible) ischemia of intestine, part and extent unspecified: Secondary | ICD-10-CM

## 2013-07-22 DIAGNOSIS — I82409 Acute embolism and thrombosis of unspecified deep veins of unspecified lower extremity: Secondary | ICD-10-CM

## 2013-07-22 DIAGNOSIS — K55069 Acute infarction of intestine, part and extent unspecified: Secondary | ICD-10-CM

## 2013-07-22 LAB — PROTIME-INR
INR: 1.6 — ABNORMAL LOW (ref 2.00–3.50)
Protime: 19.2 Seconds — ABNORMAL HIGH (ref 10.6–13.4)

## 2013-07-22 LAB — POCT INR: INR: 1.6

## 2013-07-22 NOTE — Progress Notes (Signed)
INR below goal today at 1.6 [name] is doing well with no complaints. She is one of Dr. Azucena Freed internal medicine patients but we manage her coumadin here at the cancer center She did have a fall last week at home and landed on her knees. This result in minor bruising that has healed She also has a bruise on her right hand/ring finger likely from hitting it on her table in the kitchen. Ms. Dorado states the color and size have improved the last few days No other issues and no unusual bleeding No signs or symtpoms of clotting No missed or extra doses No diet or medication changes Ms. Rollo has been on higher doses of coumadin in the past and now we may need to start increasing her back towards 5 mg daily and possibly adding in some 6 mg days as well Plan for now: Slight change to Coumadin  5 mg daily.   Recheck INR on 08/01/13; lab at 10:30am and Coumadin clinic at 10:45am

## 2013-07-22 NOTE — Progress Notes (Signed)
INTERNAL MEDICINE TEACHING ATTENDING ADDENDUM - Lorelai Huyser M.D   Indication- mesenteric thrombosis, INR- 1.6. Agree with recommendations as outlined in this note.

## 2013-07-22 NOTE — Patient Instructions (Signed)
INR below goal Slight change to Coumadin  5 mg daily.   Recheck INR on 08/01/13; lab at 10:30am and Coumadin clinic at 10:45am

## 2013-07-29 ENCOUNTER — Ambulatory Visit (HOSPITAL_BASED_OUTPATIENT_CLINIC_OR_DEPARTMENT_OTHER): Payer: Medicare Other

## 2013-07-29 ENCOUNTER — Ambulatory Visit (INDEPENDENT_AMBULATORY_CARE_PROVIDER_SITE_OTHER): Payer: Medicare Other | Admitting: Pharmacist

## 2013-07-29 DIAGNOSIS — K55059 Acute (reversible) ischemia of intestine, part and extent unspecified: Secondary | ICD-10-CM

## 2013-07-29 DIAGNOSIS — I82409 Acute embolism and thrombosis of unspecified deep veins of unspecified lower extremity: Secondary | ICD-10-CM

## 2013-07-29 DIAGNOSIS — K55069 Acute infarction of intestine, part and extent unspecified: Secondary | ICD-10-CM

## 2013-07-29 LAB — PROTIME-INR
INR: 2.1 (ref 2.00–3.50)
Protime: 25.2 Seconds — ABNORMAL HIGH (ref 10.6–13.4)

## 2013-07-29 LAB — POCT INR: INR: 2.1

## 2013-07-29 NOTE — Patient Instructions (Signed)
Continue Coumadin 5 mg daily.  Recheck INR on 08/05/13; lab at 3:30pm and Coumadin clinic at 3:45pm

## 2013-07-29 NOTE — Progress Notes (Signed)
Pt seen as a walk-in today  She was told to come in today and appmt was for Mon, 5/4 Worked her in and INR=2.1 She has been on 5 mg daily and will continue this dose Although therapeutic she prefers it a little higher She feels more comfortable being seen next Fri to make sure the 5mg  daily is keeping her therapeutic.  Continue Coumadin 5 mg daily.  Recheck INR on 08/05/13; lab at 3:30pm and Coumadin clinic at 3:45pm

## 2013-08-01 ENCOUNTER — Other Ambulatory Visit: Payer: Medicare Other

## 2013-08-01 ENCOUNTER — Ambulatory Visit: Payer: Medicare Other

## 2013-08-01 MED ORDER — WARFARIN SODIUM 5 MG PO TABS
ORAL_TABLET | ORAL | Status: DC
Start: 2013-08-01 — End: 2013-12-13

## 2013-08-03 NOTE — Progress Notes (Signed)
Visit cancelled.

## 2013-08-05 ENCOUNTER — Other Ambulatory Visit (HOSPITAL_BASED_OUTPATIENT_CLINIC_OR_DEPARTMENT_OTHER): Payer: Medicare Other

## 2013-08-05 ENCOUNTER — Ambulatory Visit (HOSPITAL_BASED_OUTPATIENT_CLINIC_OR_DEPARTMENT_OTHER): Payer: Medicare Other | Admitting: Pharmacist

## 2013-08-05 DIAGNOSIS — K55059 Acute (reversible) ischemia of intestine, part and extent unspecified: Secondary | ICD-10-CM

## 2013-08-05 DIAGNOSIS — K55069 Acute infarction of intestine, part and extent unspecified: Secondary | ICD-10-CM

## 2013-08-05 DIAGNOSIS — I82409 Acute embolism and thrombosis of unspecified deep veins of unspecified lower extremity: Secondary | ICD-10-CM

## 2013-08-05 LAB — PROTIME-INR
INR: 2.9 (ref 2.00–3.50)
Protime: 34.8 Seconds — ABNORMAL HIGH (ref 10.6–13.4)

## 2013-08-05 LAB — POCT INR: INR: 2.9

## 2013-08-05 NOTE — Progress Notes (Signed)
INR remains at goal of 2-3 on Coumadin 5mg  daily.  No changes in meds.  No bleeding/bruising.  Will continue coumadin 5mg  daily and check PT/INR in 2 weeks.  As always it was a pleasure seeing Gwendolyn Williams today.

## 2013-08-18 ENCOUNTER — Other Ambulatory Visit (HOSPITAL_BASED_OUTPATIENT_CLINIC_OR_DEPARTMENT_OTHER): Payer: Medicare Other

## 2013-08-18 ENCOUNTER — Ambulatory Visit (HOSPITAL_BASED_OUTPATIENT_CLINIC_OR_DEPARTMENT_OTHER): Payer: Medicare Other | Admitting: Pharmacist

## 2013-08-18 DIAGNOSIS — K55069 Acute infarction of intestine, part and extent unspecified: Secondary | ICD-10-CM

## 2013-08-18 DIAGNOSIS — K55059 Acute (reversible) ischemia of intestine, part and extent unspecified: Secondary | ICD-10-CM | POA: Diagnosis not present

## 2013-08-18 DIAGNOSIS — I82409 Acute embolism and thrombosis of unspecified deep veins of unspecified lower extremity: Secondary | ICD-10-CM

## 2013-08-18 LAB — PROTIME-INR
INR: 2.3 (ref 2.00–3.50)
Protime: 27.6 Seconds — ABNORMAL HIGH (ref 10.6–13.4)

## 2013-08-18 LAB — POCT INR: INR: 2.3

## 2013-08-19 NOTE — Progress Notes (Signed)
INR remains at goal of 2-3.  No changes in meds.  No bleeding/bruising.  Ms Linders has been taking coumadin 5mg  daily for 1 month now.  Will continue this dose and check PT/INR in 3 weeks per Ms Duncan Dull preference.

## 2013-09-09 ENCOUNTER — Ambulatory Visit (HOSPITAL_BASED_OUTPATIENT_CLINIC_OR_DEPARTMENT_OTHER): Payer: Medicare Other | Admitting: Pharmacist

## 2013-09-09 ENCOUNTER — Other Ambulatory Visit (HOSPITAL_BASED_OUTPATIENT_CLINIC_OR_DEPARTMENT_OTHER): Payer: Medicare Other

## 2013-09-09 DIAGNOSIS — K55069 Acute infarction of intestine, part and extent unspecified: Secondary | ICD-10-CM

## 2013-09-09 DIAGNOSIS — Z853 Personal history of malignant neoplasm of breast: Secondary | ICD-10-CM

## 2013-09-09 DIAGNOSIS — K55059 Acute (reversible) ischemia of intestine, part and extent unspecified: Secondary | ICD-10-CM | POA: Diagnosis not present

## 2013-09-09 DIAGNOSIS — Z7901 Long term (current) use of anticoagulants: Secondary | ICD-10-CM

## 2013-09-09 DIAGNOSIS — I82409 Acute embolism and thrombosis of unspecified deep veins of unspecified lower extremity: Secondary | ICD-10-CM

## 2013-09-09 DIAGNOSIS — D471 Chronic myeloproliferative disease: Secondary | ICD-10-CM

## 2013-09-09 LAB — CBC WITH DIFFERENTIAL/PLATELET
BASO%: 0.6 % (ref 0.0–2.0)
Basophils Absolute: 0.1 10*3/uL (ref 0.0–0.1)
EOS%: 1.8 % (ref 0.0–7.0)
Eosinophils Absolute: 0.2 10*3/uL (ref 0.0–0.5)
HCT: 41.8 % (ref 34.8–46.6)
HGB: 14.1 g/dL (ref 11.6–15.9)
LYMPH%: 14.6 % (ref 14.0–49.7)
MCH: 27.6 pg (ref 25.1–34.0)
MCHC: 33.7 g/dL (ref 31.5–36.0)
MCV: 82 fL (ref 79.5–101.0)
MONO#: 0.6 10*3/uL (ref 0.1–0.9)
MONO%: 6.7 % (ref 0.0–14.0)
NEUT#: 7.4 10*3/uL — ABNORMAL HIGH (ref 1.5–6.5)
NEUT%: 76.3 % (ref 38.4–76.8)
Platelets: 425 10*3/uL — ABNORMAL HIGH (ref 145–400)
RBC: 5.1 10*6/uL (ref 3.70–5.45)
RDW: 14.6 % — ABNORMAL HIGH (ref 11.2–14.5)
WBC: 9.6 10*3/uL (ref 3.9–10.3)
lymph#: 1.4 10*3/uL (ref 0.9–3.3)
nRBC: 0 % (ref 0–0)

## 2013-09-09 LAB — POCT INR: INR: 1.7

## 2013-09-09 LAB — PROTIME-INR
INR: 1.7 — ABNORMAL LOW (ref 2.00–3.50)
Protime: 20.4 Seconds — ABNORMAL HIGH (ref 10.6–13.4)

## 2013-09-09 NOTE — Patient Instructions (Addendum)
INR below goal today Take 1 1/2 tablets tonight (7.5mg  total) Continue Coumadin 5 mg daily.   Recheck INR on 09/16/13; lab at 10:30am and Coumadin clinic at 10:45am

## 2013-09-09 NOTE — Progress Notes (Signed)
INR below goal today Pt is doing well with no complaints No unusual bleeding or bruising No missed or extra doses Pt states there has been no changes to her diet or medications Pt had appeared to be stabilizing on 5 mg daily. Will watch for trend next week Plan: Take 1 1/2 tablets tonight (7.5mg  total) then Continue Coumadin 5 mg daily.   Recheck INR on 09/16/13; lab at 10:30am and Coumadin clinic at 10:45am  Pt states samples of 6 mg of coumadin have expired and she would like to have a pack on hand just in case  Samples provided Coumadin 6 mg tabs x 10 tabs LOT: [id number] Exp: 10/2014

## 2013-09-15 ENCOUNTER — Ambulatory Visit (HOSPITAL_BASED_OUTPATIENT_CLINIC_OR_DEPARTMENT_OTHER): Payer: Medicare Other | Admitting: Pharmacist

## 2013-09-15 ENCOUNTER — Other Ambulatory Visit (HOSPITAL_BASED_OUTPATIENT_CLINIC_OR_DEPARTMENT_OTHER): Payer: Medicare Other

## 2013-09-15 DIAGNOSIS — I82409 Acute embolism and thrombosis of unspecified deep veins of unspecified lower extremity: Secondary | ICD-10-CM

## 2013-09-15 DIAGNOSIS — K55069 Acute infarction of intestine, part and extent unspecified: Secondary | ICD-10-CM

## 2013-09-15 DIAGNOSIS — K55059 Acute (reversible) ischemia of intestine, part and extent unspecified: Secondary | ICD-10-CM

## 2013-09-15 LAB — PROTIME-INR
INR: 2 (ref 2.00–3.50)
Protime: 24 Seconds — ABNORMAL HIGH (ref 10.6–13.4)

## 2013-09-15 LAB — POCT INR: INR: 2

## 2013-09-15 NOTE — Progress Notes (Signed)
INR at goal today Gwendolyn Williams is doing well with no complaints No missed or extra doses No unusual bleeding or bruising No medication or diet changes since appointment last week INR is at goal but Gwendolyn Williams feels most comfortable with her INR around 2.5 Will make slight dose increase Plan: Change dose to 6 mg on Wednesdays and 5 mg all other days.  Recheck INR on 09/29/13; lab at 2:15pm and 2:30pm for coumadin clinic *Dr. Beryle Beams internal medicine patient*

## 2013-09-15 NOTE — Patient Instructions (Signed)
INR at goal Change dose to 6 mg on Wednesdays and 5 mg all other days.  Recheck INR on 09/29/13; lab at 2:15pm and 2:30pm for coumadin clinic

## 2013-09-16 ENCOUNTER — Ambulatory Visit: Payer: Medicare Other

## 2013-09-16 ENCOUNTER — Other Ambulatory Visit: Payer: Medicare Other

## 2013-09-28 ENCOUNTER — Other Ambulatory Visit: Payer: Medicare Other

## 2013-09-28 ENCOUNTER — Ambulatory Visit: Payer: Medicare Other

## 2013-09-29 ENCOUNTER — Other Ambulatory Visit (HOSPITAL_BASED_OUTPATIENT_CLINIC_OR_DEPARTMENT_OTHER): Payer: Medicare Other

## 2013-09-29 ENCOUNTER — Ambulatory Visit (HOSPITAL_BASED_OUTPATIENT_CLINIC_OR_DEPARTMENT_OTHER): Payer: Medicare Other | Admitting: Pharmacist

## 2013-09-29 DIAGNOSIS — I82409 Acute embolism and thrombosis of unspecified deep veins of unspecified lower extremity: Secondary | ICD-10-CM

## 2013-09-29 DIAGNOSIS — K55059 Acute (reversible) ischemia of intestine, part and extent unspecified: Secondary | ICD-10-CM

## 2013-09-29 DIAGNOSIS — K55069 Acute infarction of intestine, part and extent unspecified: Secondary | ICD-10-CM

## 2013-09-29 LAB — PROTIME-INR
INR: 2.5 (ref 2.00–3.50)
Protime: 30 Seconds — ABNORMAL HIGH (ref 10.6–13.4)

## 2013-09-29 LAB — POCT INR: INR: 2.5

## 2013-09-29 MED ORDER — WARFARIN SODIUM 6 MG PO TABS: ORAL_TABLET | ORAL | Status: DC

## 2013-09-29 NOTE — Progress Notes (Signed)
INR at goal Pt is doing well with no complaints Slight dose adjustment seems to have worked No unusual bleeding or bruising No missed or extra doses No medication or diet changes Plan: No Changes Continue coumadin 6 mg on Wednesdays and 5 mg all other days.  Recheck INR on 10/13/13; lab at 2:00pm and 2:15pm for coumadin clinic  Coumadin Samples  Coumadin 6 mg x 10 tabs LOT: [id number] Exp: 10/2014

## 2013-09-29 NOTE — Patient Instructions (Signed)
INR at goal No Changes Continue coumadin 6 mg on Wednesdays and 5 mg all other days.  Recheck INR on 10/13/13; lab at 2:00pm and 2:15pm for coumadin clinic

## 2013-10-13 ENCOUNTER — Other Ambulatory Visit (HOSPITAL_BASED_OUTPATIENT_CLINIC_OR_DEPARTMENT_OTHER): Payer: Medicare Other

## 2013-10-13 ENCOUNTER — Ambulatory Visit (HOSPITAL_BASED_OUTPATIENT_CLINIC_OR_DEPARTMENT_OTHER): Payer: Medicare Other | Admitting: Pharmacist

## 2013-10-13 DIAGNOSIS — K55059 Acute (reversible) ischemia of intestine, part and extent unspecified: Secondary | ICD-10-CM | POA: Diagnosis not present

## 2013-10-13 DIAGNOSIS — I82409 Acute embolism and thrombosis of unspecified deep veins of unspecified lower extremity: Secondary | ICD-10-CM

## 2013-10-13 DIAGNOSIS — K55069 Acute infarction of intestine, part and extent unspecified: Secondary | ICD-10-CM

## 2013-10-13 LAB — POCT INR: INR: 1.6

## 2013-10-13 LAB — PROTIME-INR
INR: 1.6 — ABNORMAL LOW (ref 2.00–3.50)
Protime: 19.2 Seconds — ABNORMAL HIGH (ref 10.6–13.4)

## 2013-10-13 NOTE — Patient Instructions (Signed)
INR below goal Change dose to 6 mg on Tues/Thurs/Saturday and 5 mg all other days.  Recheck INR on 10/20/13; lab at 2:00pm and 2:15pm for coumadin clinic

## 2013-10-13 NOTE — Progress Notes (Signed)
INR below goal today at 1.6 Pt is doing well with no complaints No missed or extra doses No unusual bleeding or bruising No S/Sx of clotting Pt reports no medication or diet changes She did have some alcohol to drink the other night Pt cannot think of any changes that could account for her low INR this week Will make a slight dose increase Plan: Change dose to 6 mg on Tues/Thurs/Saturday and 5 mg all other days.  Recheck INR on 10/20/13; lab at 2:00pm and 2:15pm for coumadin clinic

## 2013-10-21 ENCOUNTER — Ambulatory Visit (HOSPITAL_BASED_OUTPATIENT_CLINIC_OR_DEPARTMENT_OTHER): Payer: Medicare Other | Admitting: Pharmacist

## 2013-10-21 ENCOUNTER — Other Ambulatory Visit (HOSPITAL_BASED_OUTPATIENT_CLINIC_OR_DEPARTMENT_OTHER): Payer: Medicare Other

## 2013-10-21 DIAGNOSIS — K55069 Acute infarction of intestine, part and extent unspecified: Secondary | ICD-10-CM

## 2013-10-21 DIAGNOSIS — K55059 Acute (reversible) ischemia of intestine, part and extent unspecified: Secondary | ICD-10-CM

## 2013-10-21 DIAGNOSIS — I82409 Acute embolism and thrombosis of unspecified deep veins of unspecified lower extremity: Secondary | ICD-10-CM

## 2013-10-21 LAB — PROTIME-INR
INR: 2.4 (ref 2.00–3.50)
Protime: 28.8 Seconds — ABNORMAL HIGH (ref 10.6–13.4)

## 2013-10-21 LAB — POCT INR: INR: 2.4

## 2013-10-21 NOTE — Progress Notes (Signed)
Pt seen in clinic today INR=2.4 No changes to report She suggest being seen in 10 days to make sure this therapeutic INR remains at goal. Continue Coumadin 6 mg on Tues/Thurs/Saturday and 5 mg all other days.  Recheck INR on 10/31/13; lab at 2:00pm and 2:15pm for coumadin clinic

## 2013-10-21 NOTE — Patient Instructions (Signed)
Continue Coumadin 6 mg on Tues/Thurs/Saturday and 5 mg all other days. Recheck INR on 10/31/13; lab at 2:00pm and 2:15pm for coumadin clinic

## 2013-10-31 ENCOUNTER — Other Ambulatory Visit (HOSPITAL_BASED_OUTPATIENT_CLINIC_OR_DEPARTMENT_OTHER): Payer: Medicare Other

## 2013-10-31 ENCOUNTER — Ambulatory Visit (HOSPITAL_BASED_OUTPATIENT_CLINIC_OR_DEPARTMENT_OTHER): Payer: Medicare Other | Admitting: Pharmacist

## 2013-10-31 DIAGNOSIS — Z7901 Long term (current) use of anticoagulants: Secondary | ICD-10-CM

## 2013-10-31 DIAGNOSIS — I82409 Acute embolism and thrombosis of unspecified deep veins of unspecified lower extremity: Secondary | ICD-10-CM

## 2013-10-31 DIAGNOSIS — K55059 Acute (reversible) ischemia of intestine, part and extent unspecified: Secondary | ICD-10-CM | POA: Diagnosis not present

## 2013-10-31 DIAGNOSIS — Z853 Personal history of malignant neoplasm of breast: Secondary | ICD-10-CM

## 2013-10-31 DIAGNOSIS — K55069 Acute infarction of intestine, part and extent unspecified: Secondary | ICD-10-CM

## 2013-10-31 DIAGNOSIS — D471 Chronic myeloproliferative disease: Secondary | ICD-10-CM

## 2013-10-31 LAB — CBC WITH DIFFERENTIAL/PLATELET
BASO%: 0.7 % (ref 0.0–2.0)
Basophils Absolute: 0.1 10*3/uL (ref 0.0–0.1)
EOS%: 2.3 % (ref 0.0–7.0)
Eosinophils Absolute: 0.2 10*3/uL (ref 0.0–0.5)
HCT: 42.7 % (ref 34.8–46.6)
HGB: 14.1 g/dL (ref 11.6–15.9)
LYMPH%: 14.8 % (ref 14.0–49.7)
MCH: 27.4 pg (ref 25.1–34.0)
MCHC: 33 g/dL (ref 31.5–36.0)
MCV: 83.1 fL (ref 79.5–101.0)
MONO#: 0.6 10*3/uL (ref 0.1–0.9)
MONO%: 6.1 % (ref 0.0–14.0)
NEUT#: 7.8 10*3/uL — ABNORMAL HIGH (ref 1.5–6.5)
NEUT%: 76.1 % (ref 38.4–76.8)
Platelets: 454 10*3/uL — ABNORMAL HIGH (ref 145–400)
RBC: 5.14 10*6/uL (ref 3.70–5.45)
RDW: 15.7 % — ABNORMAL HIGH (ref 11.2–14.5)
WBC: 10.3 10*3/uL (ref 3.9–10.3)
lymph#: 1.5 10*3/uL (ref 0.9–3.3)
nRBC: 0 % (ref 0–0)

## 2013-10-31 LAB — POCT INR: INR: 2.7

## 2013-10-31 LAB — PROTIME-INR
INR: 2.7 (ref 2.00–3.50)
Protime: 32.4 Seconds — ABNORMAL HIGH (ref 10.6–13.4)

## 2013-10-31 NOTE — Progress Notes (Signed)
INR at goal  Pt is doing well today with no complaints No unusual bleeding or bruising No missed or extra doses No medication or diet changes Pt had a nice visit to the beach with her husband last week Ms. 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  Recheck INR on 11/15/13; lab at 3:45pm and 4pm for coumadin clinic

## 2013-10-31 NOTE — Patient Instructions (Signed)
INR at goal  No Changes Continue Coumadin 6 mg on Tues/Thurs/Saturday and 5 mg all other days.  Recheck INR on 11/15/13; lab at 3:45pm and 4pm for coumadin clinic

## 2013-11-04 DIAGNOSIS — Z1211 Encounter for screening for malignant neoplasm of colon: Secondary | ICD-10-CM | POA: Diagnosis not present

## 2013-11-04 DIAGNOSIS — K219 Gastro-esophageal reflux disease without esophagitis: Secondary | ICD-10-CM | POA: Diagnosis not present

## 2013-11-04 DIAGNOSIS — E7800 Pure hypercholesterolemia, unspecified: Secondary | ICD-10-CM | POA: Diagnosis not present

## 2013-11-04 DIAGNOSIS — I129 Hypertensive chronic kidney disease with stage 1 through stage 4 chronic kidney disease, or unspecified chronic kidney disease: Secondary | ICD-10-CM | POA: Diagnosis not present

## 2013-11-04 DIAGNOSIS — N183 Chronic kidney disease, stage 3 unspecified: Secondary | ICD-10-CM | POA: Diagnosis not present

## 2013-11-04 DIAGNOSIS — Z1331 Encounter for screening for depression: Secondary | ICD-10-CM | POA: Diagnosis not present

## 2013-11-07 NOTE — Telephone Encounter (Signed)
Phone call - encounter closed. 

## 2013-11-15 ENCOUNTER — Other Ambulatory Visit (HOSPITAL_BASED_OUTPATIENT_CLINIC_OR_DEPARTMENT_OTHER): Payer: Medicare Other

## 2013-11-15 ENCOUNTER — Ambulatory Visit (HOSPITAL_BASED_OUTPATIENT_CLINIC_OR_DEPARTMENT_OTHER): Payer: Medicare Other | Admitting: Pharmacist

## 2013-11-15 DIAGNOSIS — I82409 Acute embolism and thrombosis of unspecified deep veins of unspecified lower extremity: Secondary | ICD-10-CM

## 2013-11-15 DIAGNOSIS — K55059 Acute (reversible) ischemia of intestine, part and extent unspecified: Secondary | ICD-10-CM

## 2013-11-15 DIAGNOSIS — K55069 Acute infarction of intestine, part and extent unspecified: Secondary | ICD-10-CM

## 2013-11-15 LAB — PROTIME-INR
INR: 2.3 (ref 2.00–3.50)
Protime: 27.6 Seconds — ABNORMAL HIGH (ref 10.6–13.4)

## 2013-11-15 LAB — POCT INR: INR: 2.3

## 2013-11-15 MED ORDER — WARFARIN SODIUM 6 MG PO TABS: ORAL_TABLET | ORAL | Status: DC

## 2013-11-15 NOTE — Progress Notes (Signed)
Patient seen in clinic today INR=2.3 with 5mg  daily with 6mg  on TThSa No changes to report She request Fri appmts not that school is started back up. Sent new RX to [facility] for 6mg  tablets with correct sig taking it 3 times a week  Continue Coumadin 6 mg on Tues/Thurs/Saturday and 5 mg all other days.  Recheck INR on 12/09/13; lab at 3:30pm and 3:45pm for coumadin clinic

## 2013-11-15 NOTE — Patient Instructions (Signed)
Continue Coumadin 6 mg on Tues/Thurs/Saturday and 5 mg all other days. Recheck INR on 12/09/13; lab at 3:30pm and 3:45pm for coumadin clinic

## 2013-12-06 ENCOUNTER — Ambulatory Visit (INDEPENDENT_AMBULATORY_CARE_PROVIDER_SITE_OTHER): Payer: Medicare Other | Admitting: Oncology

## 2013-12-06 ENCOUNTER — Encounter: Payer: Self-pay | Admitting: Oncology

## 2013-12-06 VITALS — BP 106/66 | HR 68 | Temp 97.8°F | Ht 60.0 in | Wt 107.3 lb

## 2013-12-06 DIAGNOSIS — D471 Chronic myeloproliferative disease: Secondary | ICD-10-CM

## 2013-12-06 DIAGNOSIS — T386X5A Adverse effect of antigonadotrophins, antiestrogens, antiandrogens, not elsewhere classified, initial encounter: Secondary | ICD-10-CM

## 2013-12-06 DIAGNOSIS — Z853 Personal history of malignant neoplasm of breast: Secondary | ICD-10-CM

## 2013-12-06 DIAGNOSIS — K55059 Acute (reversible) ischemia of intestine, part and extent unspecified: Secondary | ICD-10-CM

## 2013-12-06 DIAGNOSIS — M818 Other osteoporosis without current pathological fracture: Secondary | ICD-10-CM

## 2013-12-06 DIAGNOSIS — K55069 Acute infarction of intestine, part and extent unspecified: Secondary | ICD-10-CM

## 2013-12-06 DIAGNOSIS — D47Z9 Other specified neoplasms of uncertain behavior of lymphoid, hematopoietic and related tissue: Secondary | ICD-10-CM | POA: Diagnosis not present

## 2013-12-06 DIAGNOSIS — T38805A Adverse effect of unspecified hormones and synthetic substitutes, initial encounter: Secondary | ICD-10-CM

## 2013-12-06 MED ORDER — DIAZEPAM 5 MG PO TABS: 5.0000 mg | ORAL_TABLET | Freq: Three times a day (TID) | ORAL | Status: AC | PRN

## 2013-12-06 NOTE — Patient Instructions (Signed)
Mammogram and bone density at [facility] for 01/12/14 Lab 06/06/14 at [place] MD visit 13/15/16 with Dr Darnell Level

## 2013-12-06 NOTE — Progress Notes (Signed)
Patient ID: Gwendolyn Williams, female   DOB: Nov 16, 1940, 73 y.o.   MRN: [id number] Hematology and Oncology Follow Up Visit  Gwendolyn Williams [id number] 08-05-1940 73 y.o. 12/06/2013 10:54 AM   Principle Diagnosis: Encounter Diagnoses  Name Primary?  . Myeloproliferative disorder Yes  . History of breast cancer in female   . Mesenteric thrombosis   . 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 She has been on full dose Coumadin anticoagulation and low-dose aspirin since that time. Please see previous office progress note for additional details. She was subsequently diagnosed with a stage I, ER/PR positive, HER-2-negative, cancer of the left breast in August 2010. Oncotype DX score high risk. She underwent left lumpectomy on 12/26/2008 followed by adjuvant chemotherapy with 4 cycles of Taxotere plus Cytoxan between 02/16/2009 and 04/20/2009. She then had a course of radiation between February 14 and 07/02/2009. She was started on hormonal therapy with Arimidex which she continues at this time. BRCA1 and BRCA2 gene testing was negative.  She developed a large chest wall hematoma at time of the surgery related to tight anticoagulation.  Overall she is doing well at this time. She is still getting a sensation of dyspnea on exertion primarily when she walks up an incline. No chest pain or palpitations. Previous evaluation for cardiopulmonary problems was unrevealing. This is possibly a side effect of the Arimidex. I have seen similar symptoms in people on Megace. Her symptoms are not limiting her ability to exercise.   Medications: reviewed  Allergies:  Allergies  Allergen Reactions  . Penicillins   . Sulfa  Antibiotics     Unable to recall    Review of Systems: Hematology:  No bleeding or bruising ENT ROS: No sore throat Breast ROS: No new breast lumps Respiratory ROS: See above Cardiovascular ROS:  See above Gastrointestinal ROS: No recurrent abdominal pain. Occasional cramping. No hematochezia or melena.   Genito-Urinary ROS: No urinary tract symptoms  Musculoskeletal ROS: Occasional severe muscle cramps for which she has taken when necessary Valium in the past. Neurological ROS: No headache or change in vision  Dermatological ROS: No rash or ecchymosis. Remaining ROS negative:   Physical Exam: Blood pressure 106/66, pulse 68, temperature 97.8 F (36.6 C), temperature source Oral, height 5' (1.524 m), weight 107 lb 4.8 oz (48.671 kg), SpO2 100.00%. Wt Readings from Last 3 Encounters:  12/06/13 107 lb 4.8 oz (48.671 kg)  06/06/13 109 lb 9.6 oz (49.714 kg)  05/24/13 110 lb (49.896 kg)     General appearance: Petite Caucasian woman [name]: Pharynx no erythema, exudate, mass, or ulcer. No thyromegaly or thyroid nodules Lymph nodes: No cervical, supraclavicular, or axillary lymphadenopathy Breasts: With symmetric, small, 3 cm patches of erythema in the upper outer quadrants skin changes, no dominant mass in either breast. Surgical changes left breast with deformity and rotation medially of the nipple. Lungs: Clear to auscultation, resonant to percussion throughout Heart: Regular rhythm, no murmur, no gallop, no rub, no click, no edema Abdomen: Soft, nontender, normal bowel sounds, no mass, no organomegaly Extremities: No edema, no calf tenderness Musculoskeletal: no joint deformities GU:  Vascular: Carotid pulses 2+, no bruits, distal pulses: Dorsalis pedis 1+ symmetric Neurologic: Alert, oriented, PERRLA, optic discs sharp and vessels normal, no hemorrhage or exudate, cranial nerves  grossly normal, motor strength 5 over 5, reflexes 1+ symmetric, upper body coordination normal, gait  normal, Skin: No rash or ecchymosis  Lab Results: CBC W/Diff    Component Value Date/Time   WBC 10.3 10/31/2013 1405   WBC 3.7* 09/18/2009 0457   RBC 5.14 10/31/2013 1405   RBC 3.92 09/18/2009 0457   HGB 14.1 10/31/2013 1405   HGB 9.1* 09/18/2009 0457   HCT 42.7 10/31/2013 1405   HCT 29.2* 09/18/2009 0457   PLT 454* 10/31/2013 1405   PLT 254 09/18/2009 0457   MCV 83.1 10/31/2013 1405   MCV 74.7* 09/18/2009 0457   MCH 27.4 10/31/2013 1405   MCH 24.8* 01/08/2010 1506   MCHC 33.0 10/31/2013 1405   MCHC 31.0 09/18/2009 0457   RDW 15.7* 10/31/2013 1405   RDW 20.2* 09/18/2009 0457   LYMPHSABS 1.5 10/31/2013 1405   LYMPHSABS 1.0 09/06/2009 1400   MONOABS 0.6 10/31/2013 1405   MONOABS 0.3 09/06/2009 1400   EOSABS 0.2 10/31/2013 1405   EOSABS 0.1 09/06/2009 1400   BASOSABS 0.1 10/31/2013 1405   BASOSABS 0.0 09/06/2009 1400     Chemistry      Component Value Date/Time   NA 142 05/09/2013 0816   NA 138 08/04/2011 1537   K 3.6 05/09/2013 0816   K 3.7 08/04/2011 1537   CL 105 08/03/2012 1529   CL 103 08/04/2011 1537   CO2 29 05/09/2013 0816   CO2 24 08/04/2011 1537   BUN 28.0* 05/09/2013 0816   BUN 16 08/04/2011 1537   CREATININE 1.0 05/09/2013 0816   CREATININE 0.88 08/04/2011 1537      Component Value Date/Time   CALCIUM 10.2 05/09/2013 0816   CALCIUM 9.7 08/04/2011 1537   ALKPHOS 42 05/09/2013 0816   ALKPHOS 49 08/04/2011 1537   AST 16 05/09/2013 0816   AST 17 08/04/2011 1537   ALT 15 05/09/2013 0816   ALT 16 08/04/2011 1537   BILITOT 0.43 05/09/2013 0816   BILITOT 0.4 08/04/2011 1537      Impression:   #1. JAK2-positive myeloproliferative disorder/essential thrombocythemia.  Stable on low-dose aspirin and Coumadin. Continue the same.   #2. Status post mesenteric vascular thrombosis presenting with acute onset abdominal pain and hematochezia as first sign of her myeloproliferative disorder in January 2010.   #3. Stage I ER-positive HER-2 negative, Oncotype DX high-risk, cancer of the left breast diagnosed August 2010, treated with  lumpectomy, radiation, followed by 4 cycles of Cytoxan and Taxotere chemotherapy and currently on Arimidex hormonal therapy. Continue the Arimidex for one more year to complete 5 years of treatment. Review of  clinical trial data at that time to decide whether or not we need to extend therapy. She will get her annual mammogram next month and a two-year interval bone density study. .  #4. Essential hypertension  #5. Intermittent muscle cramps. I have refilled a small prescription for Valium.   CC: Patient Care Team: Gaynelle Arabian, MD as PCP - General (Family Medicine) Annia Belt, MD as Consulting Physician (Oncology) Rolm Bookbinder, MD as Consulting Physician (General Surgery)   Annia Belt, MD 9/8/201510:54 AM

## 2013-12-09 ENCOUNTER — Other Ambulatory Visit (HOSPITAL_BASED_OUTPATIENT_CLINIC_OR_DEPARTMENT_OTHER): Payer: Medicare Other

## 2013-12-09 ENCOUNTER — Ambulatory Visit (HOSPITAL_BASED_OUTPATIENT_CLINIC_OR_DEPARTMENT_OTHER): Payer: Medicare Other | Admitting: Pharmacist

## 2013-12-09 DIAGNOSIS — K55059 Acute (reversible) ischemia of intestine, part and extent unspecified: Secondary | ICD-10-CM

## 2013-12-09 DIAGNOSIS — I82409 Acute embolism and thrombosis of unspecified deep veins of unspecified lower extremity: Secondary | ICD-10-CM

## 2013-12-09 DIAGNOSIS — K55069 Acute infarction of intestine, part and extent unspecified: Secondary | ICD-10-CM

## 2013-12-09 LAB — PROTIME-INR
INR: 2.5 (ref 2.00–3.50)
Protime: 30 Seconds — ABNORMAL HIGH (ref 10.6–13.4)

## 2013-12-09 LAB — POCT INR: INR: 2.5

## 2013-12-09 NOTE — Progress Notes (Signed)
INR = 2.5    Goal 2-3 INR within goal range. No complications of anticoagulation noted. Dr. Beryle Beams discontinued her iron supplement and vitamin C, no other medication changes. No missed/extra doses. No dietary changes. She will continue Coumadin 6 mg on Tues/Thurs/Saturday and 5 mg all other days. We will recheck INR on 01/06/14; lab at 3:30pm and 3:45pm for Coumadin clinic.  Theone Murdoch, PharmD

## 2013-12-13 ENCOUNTER — Other Ambulatory Visit: Payer: Self-pay | Admitting: Oncology

## 2014-01-06 ENCOUNTER — Ambulatory Visit (HOSPITAL_BASED_OUTPATIENT_CLINIC_OR_DEPARTMENT_OTHER): Payer: Medicare Other | Admitting: Pharmacist

## 2014-01-06 ENCOUNTER — Other Ambulatory Visit (HOSPITAL_BASED_OUTPATIENT_CLINIC_OR_DEPARTMENT_OTHER): Payer: Medicare Other

## 2014-01-06 DIAGNOSIS — Z853 Personal history of malignant neoplasm of breast: Secondary | ICD-10-CM

## 2014-01-06 DIAGNOSIS — K550 Acute vascular disorders of intestine: Secondary | ICD-10-CM | POA: Diagnosis not present

## 2014-01-06 DIAGNOSIS — M810 Age-related osteoporosis without current pathological fracture: Secondary | ICD-10-CM | POA: Diagnosis not present

## 2014-01-06 DIAGNOSIS — K55069 Acute infarction of intestine, part and extent unspecified: Secondary | ICD-10-CM

## 2014-01-06 DIAGNOSIS — D471 Chronic myeloproliferative disease: Secondary | ICD-10-CM

## 2014-01-06 DIAGNOSIS — I82409 Acute embolism and thrombosis of unspecified deep veins of unspecified lower extremity: Secondary | ICD-10-CM

## 2014-01-06 DIAGNOSIS — Z7901 Long term (current) use of anticoagulants: Secondary | ICD-10-CM

## 2014-01-06 DIAGNOSIS — R921 Mammographic calcification found on diagnostic imaging of breast: Secondary | ICD-10-CM | POA: Diagnosis not present

## 2014-01-06 LAB — CBC WITH DIFFERENTIAL/PLATELET
BASO%: 0.7 % (ref 0.0–2.0)
Basophils Absolute: 0.1 10*3/uL (ref 0.0–0.1)
EOS%: 2.3 % (ref 0.0–7.0)
Eosinophils Absolute: 0.2 10*3/uL (ref 0.0–0.5)
HCT: 42.3 % (ref 34.8–46.6)
HGB: 14.1 g/dL (ref 11.6–15.9)
LYMPH%: 15.7 % (ref 14.0–49.7)
MCH: 27.3 pg (ref 25.1–34.0)
MCHC: 33.3 g/dL (ref 31.5–36.0)
MCV: 82 fL (ref 79.5–101.0)
MONO#: 0.5 10*3/uL (ref 0.1–0.9)
MONO%: 5.2 % (ref 0.0–14.0)
NEUT#: 7.7 10*3/uL — ABNORMAL HIGH (ref 1.5–6.5)
NEUT%: 76.1 % (ref 38.4–76.8)
Platelets: 496 10*3/uL — ABNORMAL HIGH (ref 145–400)
RBC: 5.16 10*6/uL (ref 3.70–5.45)
RDW: 14.8 % — ABNORMAL HIGH (ref 11.2–14.5)
WBC: 10.1 10*3/uL (ref 3.9–10.3)
lymph#: 1.6 10*3/uL (ref 0.9–3.3)
nRBC: 0 % (ref 0–0)

## 2014-01-06 LAB — PROTIME-INR
INR: 2.8 (ref 2.00–3.50)
Protime: 33.6 Seconds — ABNORMAL HIGH (ref 10.6–13.4)

## 2014-01-06 LAB — POCT INR: INR: 2.8

## 2014-01-06 NOTE — Progress Notes (Signed)
INR within goal today. Hg/Hct:14.1/42.3, Pltc = 496 No changes in diet or medications. No missed or extra coumadin doses. No unusual bruising.  Minor nosebleeds, small amounts occuring almost daily. Stop quickly. Nothing concerning. No s/s of clotting noted. Continue Coumadin 6 mg on Tues/Thurs/Saturday and 5mg  all other days.  Recheck INR on 02/03/14; lab at 3:15pm and 3:30pm for Coumadin clinic.

## 2014-01-06 NOTE — Patient Instructions (Signed)
Continue Coumadin 6 mg on Tues/Thurs/Saturday and 5mg  all other days.  Recheck INR on 02/03/14; lab at 3:15pm and 3:30pm for Coumadin clinic.

## 2014-01-24 DIAGNOSIS — Z01419 Encounter for gynecological examination (general) (routine) without abnormal findings: Secondary | ICD-10-CM | POA: Diagnosis not present

## 2014-02-03 ENCOUNTER — Ambulatory Visit (HOSPITAL_BASED_OUTPATIENT_CLINIC_OR_DEPARTMENT_OTHER): Payer: Self-pay | Admitting: Pharmacist

## 2014-02-03 ENCOUNTER — Other Ambulatory Visit (HOSPITAL_BASED_OUTPATIENT_CLINIC_OR_DEPARTMENT_OTHER): Payer: Medicare Other

## 2014-02-03 DIAGNOSIS — K550 Acute vascular disorders of intestine: Secondary | ICD-10-CM | POA: Diagnosis not present

## 2014-02-03 DIAGNOSIS — I82409 Acute embolism and thrombosis of unspecified deep veins of unspecified lower extremity: Secondary | ICD-10-CM

## 2014-02-03 DIAGNOSIS — K55069 Acute infarction of intestine, part and extent unspecified: Secondary | ICD-10-CM

## 2014-02-03 LAB — PROTIME-INR
INR: 3 (ref 2.00–3.50)
Protime: 36 Seconds — ABNORMAL HIGH (ref 10.6–13.4)

## 2014-02-03 LAB — POCT INR: INR: 3

## 2014-02-03 NOTE — Progress Notes (Signed)
INR at goal Pt is doing well today with no complaints No missed or extra doses No medication or diet changes No unusual bleeding or bruising Pt has been stable on current dose Plan: No changes Continue Coumadin 6 mg on Tues/Thurs/Saturday and 5 mg all other days.  Recheck INR on 03/03/14; lab at 3:30pm and 3:45pm for Coumadin clinic.

## 2014-02-03 NOTE — Patient Instructions (Signed)
INR at goal No changes Continue Coumadin 6 mg on Tues/Thurs/Saturday and 5 mg all other days.  Recheck INR on 03/03/14; lab at 3:30pm and 3:45pm for Coumadin clinic.

## 2014-03-03 ENCOUNTER — Other Ambulatory Visit (HOSPITAL_BASED_OUTPATIENT_CLINIC_OR_DEPARTMENT_OTHER): Payer: Medicare Other

## 2014-03-03 ENCOUNTER — Ambulatory Visit (HOSPITAL_BASED_OUTPATIENT_CLINIC_OR_DEPARTMENT_OTHER): Payer: Self-pay

## 2014-03-03 DIAGNOSIS — K550 Acute vascular disorders of intestine: Secondary | ICD-10-CM

## 2014-03-03 DIAGNOSIS — K55069 Acute infarction of intestine, part and extent unspecified: Secondary | ICD-10-CM

## 2014-03-03 DIAGNOSIS — I82409 Acute embolism and thrombosis of unspecified deep veins of unspecified lower extremity: Secondary | ICD-10-CM

## 2014-03-03 LAB — PROTHROMBIN TIME
INR: 3.5 — ABNORMAL HIGH (ref <1.50)
Prothrombin Time: 35.4 seconds — ABNORMAL HIGH (ref 11.6–15.2)

## 2014-03-03 LAB — PROTIME-INR

## 2014-03-03 LAB — POCT INR: INR: 3.5

## 2014-03-03 NOTE — Progress Notes (Signed)
*  Dr. Beryle Beams patient* No charge-- Telephone encounter*  Gwendolyn Williams INR was a send out INR and came back at: 3.5 which is above her goal range of 2-3. The only recent medication change has been increased intake of Tylenol as well as the addition of clindamycin for a tooth abscess/root canal procedure. Clindamycin would be anticipated to not have much of an effect on warfarin, but the increased Tylenol could increase the effect of warfarin. She also reports an increased intake of split pea soup which contains vitamin K, and could potentially decrease the INR. She endorses no missed and/or extra doses of Coumadin. No bleeding and/or unusual bruising that is out of the norm.   Plan: Take 1/2 tablet tonight (2.5 mg) then continued Coumadin 6 mg T/Th/Sat and 5 mg all other days. Return to clinic on 03/10/14 at 3:30 for lab and 3:45 for Coumadin Clinic

## 2014-03-10 ENCOUNTER — Ambulatory Visit (HOSPITAL_BASED_OUTPATIENT_CLINIC_OR_DEPARTMENT_OTHER): Payer: Medicare Other | Admitting: Pharmacist

## 2014-03-10 ENCOUNTER — Other Ambulatory Visit (HOSPITAL_BASED_OUTPATIENT_CLINIC_OR_DEPARTMENT_OTHER): Payer: Medicare Other

## 2014-03-10 DIAGNOSIS — K550 Acute vascular disorders of intestine: Secondary | ICD-10-CM

## 2014-03-10 DIAGNOSIS — K55069 Acute infarction of intestine, part and extent unspecified: Secondary | ICD-10-CM

## 2014-03-10 DIAGNOSIS — I82409 Acute embolism and thrombosis of unspecified deep veins of unspecified lower extremity: Secondary | ICD-10-CM

## 2014-03-10 LAB — PROTIME-INR
INR: 2.8 (ref 2.00–3.50)
Protime: 33.6 Seconds — ABNORMAL HIGH (ref 10.6–13.4)

## 2014-03-10 LAB — POCT INR: INR: 2.8

## 2014-03-10 NOTE — Progress Notes (Signed)
INR = 2.8 today She had INR = 3.5 last week and she took 2.5 mg on Friday then resumed 5 mg/day; 6 mg Tu/Th/Sat She had no bleeding when her INR was elevated. Pt was using more APAP for HA most likely related to recent root canal on 02/28/14. She completed Clindamycin on 03/07/14. INR at goal today.  Pt & I have decided to change her dose this week to 5 mg/day except 6 mg on Thurs/Sat. Repeat INR on 12/17.  She is leaving for [facility] on 12/18 to spend a week w/ her son Shanon Brow.  She'd feel more comfortable knowing her INR before leaving. Kennith Center, Pharm.D., CPP 03/10/2014@4 :29 PM

## 2014-03-16 ENCOUNTER — Other Ambulatory Visit (HOSPITAL_BASED_OUTPATIENT_CLINIC_OR_DEPARTMENT_OTHER): Payer: Medicare Other

## 2014-03-16 ENCOUNTER — Ambulatory Visit (HOSPITAL_BASED_OUTPATIENT_CLINIC_OR_DEPARTMENT_OTHER): Payer: Medicare Other | Admitting: Pharmacist

## 2014-03-16 DIAGNOSIS — I82409 Acute embolism and thrombosis of unspecified deep veins of unspecified lower extremity: Secondary | ICD-10-CM | POA: Diagnosis not present

## 2014-03-16 DIAGNOSIS — K55069 Acute infarction of intestine, part and extent unspecified: Secondary | ICD-10-CM

## 2014-03-16 DIAGNOSIS — K550 Acute vascular disorders of intestine: Secondary | ICD-10-CM

## 2014-03-16 LAB — PROTIME-INR
INR: 2.5 (ref 2.00–3.50)
Protime: 30 Seconds — ABNORMAL HIGH (ref 10.6–13.4)

## 2014-03-16 LAB — POCT INR: INR: 2.5

## 2014-03-16 NOTE — Patient Instructions (Addendum)
INR at goal Continue Coumadin 6 mg on Thurs/Saturday and 5 mg all other days. Starting next week move 6 mg to Tues and Saturday to space them out.  Recheck INR on 04/07/14; lab at 3:30 pm and 3:45pm for Coumadin clinic.

## 2014-03-16 NOTE — Progress Notes (Signed)
INR at goal Pt is doing well today with no complaints No changes since last week No missed or extra doses No diet or medication changes No unusual bleeding or bruising Will keep current dose but will space out the 6 mg days slightly starting next week Plan: Continue Coumadin 6 mg on Thurs/Saturday and 5 mg all other days. Starting next week move 6 mg to Tues and Saturday to space them out.  Recheck INR on 04/07/14; lab at 3:30 pm and 3:45pm for Coumadin clinic.

## 2014-04-06 ENCOUNTER — Other Ambulatory Visit: Payer: Self-pay | Admitting: Dermatology

## 2014-04-06 DIAGNOSIS — Z86018 Personal history of other benign neoplasm: Secondary | ICD-10-CM | POA: Diagnosis not present

## 2014-04-06 DIAGNOSIS — D239 Other benign neoplasm of skin, unspecified: Secondary | ICD-10-CM | POA: Diagnosis not present

## 2014-04-06 DIAGNOSIS — D485 Neoplasm of uncertain behavior of skin: Secondary | ICD-10-CM | POA: Diagnosis not present

## 2014-04-06 DIAGNOSIS — D045 Carcinoma in situ of skin of trunk: Secondary | ICD-10-CM | POA: Diagnosis not present

## 2014-04-06 DIAGNOSIS — Z85828 Personal history of other malignant neoplasm of skin: Secondary | ICD-10-CM | POA: Diagnosis not present

## 2014-04-06 DIAGNOSIS — L570 Actinic keratosis: Secondary | ICD-10-CM | POA: Diagnosis not present

## 2014-04-07 ENCOUNTER — Ambulatory Visit (HOSPITAL_BASED_OUTPATIENT_CLINIC_OR_DEPARTMENT_OTHER): Payer: Medicare Other | Admitting: Pharmacist

## 2014-04-07 ENCOUNTER — Other Ambulatory Visit (HOSPITAL_BASED_OUTPATIENT_CLINIC_OR_DEPARTMENT_OTHER): Payer: Medicare Other

## 2014-04-07 DIAGNOSIS — I82409 Acute embolism and thrombosis of unspecified deep veins of unspecified lower extremity: Secondary | ICD-10-CM

## 2014-04-07 DIAGNOSIS — K55069 Acute infarction of intestine, part and extent unspecified: Secondary | ICD-10-CM

## 2014-04-07 DIAGNOSIS — K550 Acute vascular disorders of intestine: Secondary | ICD-10-CM

## 2014-04-07 LAB — PROTIME-INR
INR: 3.2 (ref 2.00–3.50)
Protime: 38.4 Seconds — ABNORMAL HIGH (ref 10.6–13.4)

## 2014-04-07 LAB — POCT INR: INR: 3.2

## 2014-04-07 NOTE — Progress Notes (Signed)
INR = 3.2 on Coumadin 5 mg daily except 6 mg on Tues/Sat. Normal bruising. Nosebleeds while visiting her son in [place].  She said the blood was on the sheets & she got it on the towels also.  She attributes this to the dry air.  She has nosebleeds at home also that are intermittent but not as heavy as the ones she had while at her son's.  Her INR was fine before her trip; we checked it on 12/17 = 2.5. She had a mole removed by dermatologist yesterday.  She had blood on her sheets this morning when she woke up.  She states the bleeding stopped.  Her bandaid today is dry & clean. INR a little above goal today.  No apparent cause.  She has had bleeding. I will decrease her dose of Coumadin to 5 mg daily except 6 mg on Tuesdays. Return in 2 weeks. Kennith Center, Pharm.D., CPP 04/07/2014@3 :26 PM

## 2014-04-10 ENCOUNTER — Ambulatory Visit: Payer: Medicare Other

## 2014-04-10 ENCOUNTER — Other Ambulatory Visit: Payer: Medicare Other

## 2014-04-24 ENCOUNTER — Ambulatory Visit (HOSPITAL_BASED_OUTPATIENT_CLINIC_OR_DEPARTMENT_OTHER): Payer: Medicare Other | Admitting: Pharmacist

## 2014-04-24 ENCOUNTER — Other Ambulatory Visit: Payer: Medicare Other

## 2014-04-24 DIAGNOSIS — K550 Acute vascular disorders of intestine: Secondary | ICD-10-CM

## 2014-04-24 DIAGNOSIS — K55069 Acute infarction of intestine, part and extent unspecified: Secondary | ICD-10-CM

## 2014-04-24 DIAGNOSIS — I82409 Acute embolism and thrombosis of unspecified deep veins of unspecified lower extremity: Secondary | ICD-10-CM

## 2014-04-24 LAB — PROTIME-INR
INR: 1.9 — ABNORMAL LOW (ref 2.00–3.50)
Protime: 22.8 Seconds — ABNORMAL HIGH (ref 10.6–13.4)

## 2014-04-24 LAB — POCT INR: INR: 1.9

## 2014-04-24 NOTE — Patient Instructions (Signed)
Increase coumadin back to 5mg  daily except 6mg  on Mon&Thu.   Recheck INR in 2 weeks on 05/05/14: Lab at 3:15pm and coumadin clinic at 3:30pm.

## 2014-04-24 NOTE — Progress Notes (Addendum)
INR slightly below goal. Pt took coumadin as instructed. No missed or extra coumadin doses. No changes in medications. Pt had a healthy serving of split pea soup on 1/22.  I don't believe this affected her INR. No unusual bruising. No unusual bleeding. Occasional nosebleeds. Minor. Stop quickly, likely due to dryness, irritation. No s/s of clotting noted. Increase coumadin back to 5mg  daily except 6mg  on Mon&Thu.   Pt feels more comfortable with INR near 3, rather than near 2. Recheck INR in 2 weeks on 05/05/14: Lab at 3:15pm and coumadin clinic at 3:30pm. If INR elevated at next visit, may consider 5mg  daily except 5.5mg  on MWF. If so, patient may need samples or prescription for 1mg  tablets.

## 2014-05-05 ENCOUNTER — Ambulatory Visit (HOSPITAL_BASED_OUTPATIENT_CLINIC_OR_DEPARTMENT_OTHER): Payer: Medicare Other | Admitting: Pharmacist

## 2014-05-05 ENCOUNTER — Other Ambulatory Visit (HOSPITAL_BASED_OUTPATIENT_CLINIC_OR_DEPARTMENT_OTHER): Payer: Medicare Other

## 2014-05-05 DIAGNOSIS — K550 Acute vascular disorders of intestine: Secondary | ICD-10-CM

## 2014-05-05 DIAGNOSIS — I82409 Acute embolism and thrombosis of unspecified deep veins of unspecified lower extremity: Secondary | ICD-10-CM

## 2014-05-05 DIAGNOSIS — K55069 Acute infarction of intestine, part and extent unspecified: Secondary | ICD-10-CM

## 2014-05-05 LAB — PROTIME-INR
INR: 2.9 (ref 2.00–3.50)
Protime: 34.8 Seconds — ABNORMAL HIGH (ref 10.6–13.4)

## 2014-05-05 LAB — POCT INR: INR: 2.9

## 2014-05-05 NOTE — Progress Notes (Signed)
INR = 2.9 on Coumadin 5 mg daily except 6 mg Mon/Thurs. Pt doing well w/o complaints re: anticoag. She is more comfortable w/ today's INR result so we will leave her dose alone. Return in 3 weeks. Kennith Center, Pharm.D., CPP 05/05/2014@3 :39 PM

## 2014-05-11 DIAGNOSIS — Z23 Encounter for immunization: Secondary | ICD-10-CM | POA: Diagnosis not present

## 2014-05-11 DIAGNOSIS — I129 Hypertensive chronic kidney disease with stage 1 through stage 4 chronic kidney disease, or unspecified chronic kidney disease: Secondary | ICD-10-CM | POA: Diagnosis not present

## 2014-05-11 DIAGNOSIS — E780 Pure hypercholesterolemia: Secondary | ICD-10-CM | POA: Diagnosis not present

## 2014-05-11 DIAGNOSIS — N183 Chronic kidney disease, stage 3 (moderate): Secondary | ICD-10-CM | POA: Diagnosis not present

## 2014-05-11 DIAGNOSIS — C946 Myelodysplastic disease, not classified: Secondary | ICD-10-CM | POA: Diagnosis not present

## 2014-05-11 DIAGNOSIS — E559 Vitamin D deficiency, unspecified: Secondary | ICD-10-CM | POA: Diagnosis not present

## 2014-05-11 DIAGNOSIS — M810 Age-related osteoporosis without current pathological fracture: Secondary | ICD-10-CM | POA: Diagnosis not present

## 2014-05-11 DIAGNOSIS — Z853 Personal history of malignant neoplasm of breast: Secondary | ICD-10-CM | POA: Diagnosis not present

## 2014-05-11 DIAGNOSIS — K219 Gastro-esophageal reflux disease without esophagitis: Secondary | ICD-10-CM | POA: Diagnosis not present

## 2014-05-24 DIAGNOSIS — D045 Carcinoma in situ of skin of trunk: Secondary | ICD-10-CM | POA: Diagnosis not present

## 2014-05-26 ENCOUNTER — Ambulatory Visit (HOSPITAL_BASED_OUTPATIENT_CLINIC_OR_DEPARTMENT_OTHER): Payer: Medicare Other | Admitting: Pharmacist

## 2014-05-26 ENCOUNTER — Other Ambulatory Visit (HOSPITAL_BASED_OUTPATIENT_CLINIC_OR_DEPARTMENT_OTHER): Payer: Medicare Other

## 2014-05-26 DIAGNOSIS — K55069 Acute infarction of intestine, part and extent unspecified: Secondary | ICD-10-CM

## 2014-05-26 DIAGNOSIS — K550 Acute vascular disorders of intestine: Secondary | ICD-10-CM

## 2014-05-26 LAB — PROTIME-INR
INR: 2.5 (ref 2.00–3.50)
Protime: 30 Seconds — ABNORMAL HIGH (ref 10.6–13.4)

## 2014-05-26 LAB — POCT INR: INR: 2.5

## 2014-05-26 NOTE — Progress Notes (Signed)
INR = 2.5 on Coumadin 5 mg daily except 6 mg on Mon/Thurs. No complaints re: anticoag. INR at goal.  No change to Coumadin dose. Return in 1 month. Kennith Center, Pharm.D., CPP 05/26/2014@4 :01 PM

## 2014-06-06 ENCOUNTER — Other Ambulatory Visit (INDEPENDENT_AMBULATORY_CARE_PROVIDER_SITE_OTHER): Payer: Medicare Other

## 2014-06-06 DIAGNOSIS — D471 Chronic myeloproliferative disease: Secondary | ICD-10-CM

## 2014-06-06 DIAGNOSIS — Z853 Personal history of malignant neoplasm of breast: Secondary | ICD-10-CM

## 2014-06-06 DIAGNOSIS — M818 Other osteoporosis without current pathological fracture: Secondary | ICD-10-CM | POA: Diagnosis not present

## 2014-06-06 DIAGNOSIS — K55069 Acute infarction of intestine, part and extent unspecified: Secondary | ICD-10-CM

## 2014-06-06 DIAGNOSIS — T386X5A Adverse effect of antigonadotrophins, antiestrogens, antiandrogens, not elsewhere classified, initial encounter: Secondary | ICD-10-CM

## 2014-06-06 DIAGNOSIS — K550 Acute vascular disorders of intestine: Secondary | ICD-10-CM | POA: Diagnosis not present

## 2014-06-06 LAB — CBC WITH DIFFERENTIAL/PLATELET
Basophils Absolute: 0.1 10*3/uL (ref 0.0–0.1)
Basophils Relative: 1 % (ref 0–1)
Eosinophils Absolute: 0.2 10*3/uL (ref 0.0–0.7)
Eosinophils Relative: 3 % (ref 0–5)
HCT: 44.8 % (ref 36.0–46.0)
Hemoglobin: 13.9 g/dL (ref 12.0–15.0)
Lymphocytes Relative: 18 % (ref 12–46)
Lymphs Abs: 1.3 10*3/uL (ref 0.7–4.0)
MCH: 23.9 pg — ABNORMAL LOW (ref 26.0–34.0)
MCHC: 31 g/dL (ref 30.0–36.0)
MCV: 77.1 fL — ABNORMAL LOW (ref 78.0–100.0)
MPV: 10 fL (ref 8.6–12.4)
Monocytes Absolute: 0.4 10*3/uL (ref 0.1–1.0)
Monocytes Relative: 6 % (ref 3–12)
Neutro Abs: 5.1 10*3/uL (ref 1.7–7.7)
Neutrophils Relative %: 72 % (ref 43–77)
Platelets: 546 10*3/uL — ABNORMAL HIGH (ref 150–400)
RBC: 5.81 MIL/uL — ABNORMAL HIGH (ref 3.87–5.11)
RDW: 15.9 % — ABNORMAL HIGH (ref 11.5–15.5)
WBC: 7.1 10*3/uL (ref 4.0–10.5)

## 2014-06-06 LAB — COMPREHENSIVE METABOLIC PANEL
ALT: 11 U/L (ref 0–35)
AST: 16 U/L (ref 0–37)
Albumin: 4.2 g/dL (ref 3.5–5.2)
Alkaline Phosphatase: 38 U/L — ABNORMAL LOW (ref 39–117)
BUN: 34 mg/dL — ABNORMAL HIGH (ref 6–23)
CO2: 29 mEq/L (ref 19–32)
Calcium: 9.9 mg/dL (ref 8.4–10.5)
Chloride: 101 mEq/L (ref 96–112)
Creat: 1.18 mg/dL — ABNORMAL HIGH (ref 0.50–1.10)
Glucose, Bld: 72 mg/dL (ref 70–99)
Potassium: 4.4 mEq/L (ref 3.5–5.3)
Sodium: 137 mEq/L (ref 135–145)
Total Bilirubin: 0.5 mg/dL (ref 0.2–1.2)
Total Protein: 6.3 g/dL (ref 6.0–8.3)

## 2014-06-06 LAB — LACTATE DEHYDROGENASE: LDH: 224 U/L (ref 94–250)

## 2014-06-12 ENCOUNTER — Telehealth: Payer: Self-pay | Admitting: Oncology

## 2014-06-12 ENCOUNTER — Other Ambulatory Visit: Payer: Self-pay | Admitting: Oncology

## 2014-06-12 ENCOUNTER — Encounter: Payer: Self-pay | Admitting: Oncology

## 2014-06-12 ENCOUNTER — Ambulatory Visit (INDEPENDENT_AMBULATORY_CARE_PROVIDER_SITE_OTHER): Payer: Medicare Other | Admitting: Oncology

## 2014-06-12 VITALS — BP 105/70 | HR 70 | Temp 97.6°F | Ht 60.0 in | Wt 107.0 lb

## 2014-06-12 DIAGNOSIS — M818 Other osteoporosis without current pathological fracture: Secondary | ICD-10-CM | POA: Diagnosis not present

## 2014-06-12 DIAGNOSIS — T386X5A Adverse effect of antigonadotrophins, antiestrogens, antiandrogens, not elsewhere classified, initial encounter: Secondary | ICD-10-CM | POA: Diagnosis not present

## 2014-06-12 DIAGNOSIS — K550 Acute vascular disorders of intestine: Secondary | ICD-10-CM | POA: Diagnosis not present

## 2014-06-12 DIAGNOSIS — Z853 Personal history of malignant neoplasm of breast: Secondary | ICD-10-CM | POA: Diagnosis not present

## 2014-06-12 DIAGNOSIS — K55069 Acute infarction of intestine, part and extent unspecified: Secondary | ICD-10-CM

## 2014-06-12 DIAGNOSIS — D471 Chronic myeloproliferative disease: Secondary | ICD-10-CM

## 2014-06-12 DIAGNOSIS — Z7901 Long term (current) use of anticoagulants: Secondary | ICD-10-CM

## 2014-06-12 NOTE — Progress Notes (Signed)
Patient ID: Gwendolyn Williams, female   DOB: Dec 28, 1940, 74 y.o.   MRN: [id number] Hematology and Oncology Follow Up Visit  CEYDA PETERKA [id number] 17-Nov-1940 74 y.o. 06/12/2014 12:47 PM   Principle Diagnosis: Encounter Diagnoses  Name Primary?  . Myeloproliferative disorder Yes  . History of breast cancer in female   . Mesenteric thrombosis   . 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 She has been on full dose Coumadin anticoagulation and low-dose aspirin since that time. Please see previous office progress notes for additional details. She was subsequently diagnosed with a stage I, ER/PR positive, HER-2-negative, cancer of the left breast in August 2010. Oncotype DX score high risk. She underwent left lumpectomy on 12/26/2008 followed by adjuvant chemotherapy with 4 cycles of Taxotere plus Cytoxan between 02/16/2009 and 04/20/2009. She then had a course of radiation between February 14 and 07/02/2009. She was started on hormonal therapy with Arimidex which she continues at this time. BRCA1 and BRCA2 gene testing was negative. She has one son. No daughters. She developed a large chest wall hematoma at time of the surgery related to tight anticoagulation which has long since resolved.  Interim History:   She has had no interim medical or surgical problems. She still reports some mild dyspnea on exertion which she attributes to the hormonal therapy. No ischemic type cardiac complaints. Most recent mammogram done September 2015 with no new disease.  Medications: reviewed  Allergies:  Allergies  Allergen Reactions  . Penicillins   . Sulfa Antibiotics     Unable to recall    Review of Systems: See history of  present illness Remaining ROS negative:   Physical Exam: Blood pressure 105/70, pulse 70, temperature 97.6 F (36.4 C), height 5' (1.524 m), weight 107 lb (48.535 kg), SpO2 100 %. Wt Readings from Last 3 Encounters:  06/12/14 107 lb (48.535 kg)  12/06/13 107 lb 4.8 oz (48.671 kg)  06/06/13 109 lb 9.6 oz (49.714 kg)     General appearance: Thin Caucasian woman [name]: Pharynx no erythema, exudate, mass, or ulcer. No thyromegaly or thyroid nodules Lymph nodes: No cervical, supraclavicular, or axillary lymphadenopathy Breasts: Extensive surgical changes left breast with no palpable dominant masses in either breast. Lungs: Clear to auscultation, resonant to percussion throughout Heart: Regular rhythm, no murmur, no gallop, no rub, no click, no edema Abdomen: Soft, nontender, normal bowel sounds, no mass, no organomegaly Extremities: No edema, no calf tenderness Musculoskeletal: no joint deformities GU:  Vascular: Carotid pulses 2+, no bruits,  Neurologic: Alert, oriented, PERRLA, optic discs sharp and vessels normal, no hemorrhage or exudate, cranial nerves grossly normal, motor strength 5 over 5, reflexes 1+ symmetric, upper body coordination normal, gait normal, Skin: No rash or ecchymosis  Lab Results: CBC W/Diff    Component Value Date/Time   WBC 7.1 06/06/2014 1002   WBC 10.1 01/06/2014 1523   RBC 5.81* 06/06/2014 1002   RBC 5.16 01/06/2014 1523   HGB 13.9 06/06/2014 1002   HGB 14.1 01/06/2014 1523   HCT 44.8 06/06/2014 1002   HCT 42.3 01/06/2014 1523   PLT 546* 06/06/2014 1002   PLT 496* 01/06/2014 1523   MCV 77.1* 06/06/2014 1002   MCV 82.0 01/06/2014 1523   MCH 23.9* 06/06/2014 1002   MCH 27.3 01/06/2014 1523   MCHC  31.0 06/06/2014 1002   MCHC 33.3 01/06/2014 1523   RDW 15.9* 06/06/2014 1002   RDW 14.8* 01/06/2014 1523   LYMPHSABS 1.3 06/06/2014 1002   LYMPHSABS 1.6 01/06/2014 1523   MONOABS 0.4 06/06/2014 1002   MONOABS 0.5 01/06/2014 1523   EOSABS 0.2  06/06/2014 1002   EOSABS 0.2 01/06/2014 1523   BASOSABS 0.1 06/06/2014 1002   BASOSABS 0.1 01/06/2014 1523     Chemistry      Component Value Date/Time   NA 137 06/06/2014 1002   NA 142 05/09/2013 0816   K 4.4 06/06/2014 1002   K 3.6 05/09/2013 0816   CL 101 06/06/2014 1002   CL 105 08/03/2012 1529   CO2 29 06/06/2014 1002   CO2 29 05/09/2013 0816   BUN 34* 06/06/2014 1002   BUN 28.0* 05/09/2013 0816   CREATININE 1.18* 06/06/2014 1002   CREATININE 1.0 05/09/2013 0816   CREATININE 0.88 08/04/2011 1537      Component Value Date/Time   CALCIUM 9.9 06/06/2014 1002   CALCIUM 10.2 05/09/2013 0816   ALKPHOS 38* 06/06/2014 1002   ALKPHOS 42 05/09/2013 0816   AST 16 06/06/2014 1002   AST 16 05/09/2013 0816   ALT 11 06/06/2014 1002   ALT 15 05/09/2013 0816   BILITOT 0.5 06/06/2014 1002   BILITOT 0.43 05/09/2013 0816       Radiological Studies: No results found.  Impression:  #1. Myeloproliferative disorder: Essential thrombocythemia Platelet count is trending up for last 2 values now over 500,000. She has not required a platelet lowering drug up to this point. I will continue to monitor blood counts every 3 months. If platelet count continues to rise, then I will start her on Hydrea.  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 No subsequent thrombotic events on full dose Coumadin and low-dose aspirin. These will be continued indefinitely.  #3. Stage I, ER positive, HER-2 negative, Oncotype DX high risk cancer of the left breast treated with chemotherapy and now on hormonal therapy with Arimidex. She has now been on the drug for 5 years. We discussed that overall for a good risk patient our professional society recommends stopping treatment at 5 years until there are comparative data available looking at 5 versus 10 years. However she is tolerating the drug well, and now that we have a genetic profile  that shows she is actually high risk based on her Oncotype DX score, I  do not think it is unreasonable to extend therapy for an additional 5 years and she is in agreement with this.  #4. Essential hypertension    CC: Patient Care Team: Gaynelle Arabian, MD as PCP - General (Family Medicine) Annia Belt, MD as Consulting Physician (Oncology) Rolm Bookbinder, MD as Consulting Physician (General Surgery)   Annia Belt, MD 3/14/201612:47 PM

## 2014-06-12 NOTE — Patient Instructions (Signed)
Continue monthly CBC & protime checks at cancer center Return visit with Dr Darnell Level in 6 months Lab at [facility] 1 week before visit Next mammogram due 10/16

## 2014-06-12 NOTE — Telephone Encounter (Signed)
Added additional mthly labs and q106mo labs per 3/14 pof. Appt for 3/25 reamins the same. New appts begin 4/22. S/w pt spouse re add on appts and confirmed 3/25 appt. Pt to get new schedule when she comes in 3/25.

## 2014-06-23 ENCOUNTER — Other Ambulatory Visit (HOSPITAL_BASED_OUTPATIENT_CLINIC_OR_DEPARTMENT_OTHER): Payer: Medicare Other

## 2014-06-23 ENCOUNTER — Ambulatory Visit (HOSPITAL_BASED_OUTPATIENT_CLINIC_OR_DEPARTMENT_OTHER): Payer: Medicare Other | Admitting: Pharmacist

## 2014-06-23 DIAGNOSIS — K550 Acute vascular disorders of intestine: Secondary | ICD-10-CM | POA: Diagnosis not present

## 2014-06-23 DIAGNOSIS — D471 Chronic myeloproliferative disease: Secondary | ICD-10-CM

## 2014-06-23 DIAGNOSIS — Z7901 Long term (current) use of anticoagulants: Secondary | ICD-10-CM

## 2014-06-23 DIAGNOSIS — K55069 Acute infarction of intestine, part and extent unspecified: Secondary | ICD-10-CM

## 2014-06-23 LAB — CBC WITH DIFFERENTIAL/PLATELET
BASO%: 0.8 % (ref 0.0–2.0)
Basophils Absolute: 0.1 10*3/uL (ref 0.0–0.1)
EOS%: 2.2 % (ref 0.0–7.0)
Eosinophils Absolute: 0.2 10*3/uL (ref 0.0–0.5)
HCT: 39 % (ref 34.8–46.6)
HGB: 12.7 g/dL (ref 11.6–15.9)
LYMPH%: 16.4 % (ref 14.0–49.7)
MCH: 24.5 pg — ABNORMAL LOW (ref 25.1–34.0)
MCHC: 32.6 g/dL (ref 31.5–36.0)
MCV: 75.3 fL — ABNORMAL LOW (ref 79.5–101.0)
MONO#: 0.7 10*3/uL (ref 0.1–0.9)
MONO%: 6.4 % (ref 0.0–14.0)
NEUT#: 7.6 10*3/uL — ABNORMAL HIGH (ref 1.5–6.5)
NEUT%: 74.2 % (ref 38.4–76.8)
Platelets: 528 10*3/uL — ABNORMAL HIGH (ref 145–400)
RBC: 5.18 10*6/uL (ref 3.70–5.45)
RDW: 15.6 % — ABNORMAL HIGH (ref 11.2–14.5)
WBC: 10.2 10*3/uL (ref 3.9–10.3)
lymph#: 1.7 10*3/uL (ref 0.9–3.3)
nRBC: 0 % (ref 0–0)

## 2014-06-23 LAB — PROTIME-INR
INR: 3.2 (ref 2.00–3.50)
Protime: 38.4 Seconds — ABNORMAL HIGH (ref 10.6–13.4)

## 2014-06-23 LAB — POCT INR: INR: 3.2

## 2014-06-23 NOTE — Patient Instructions (Signed)
INR right at goal No changes Continue 5mg  daily except 6mg  on Mon&Thu.   Recheck INR in 1 month on 07/20/14: Lab at 1:15 pm and coumadin clinic at 1:30 pm.

## 2014-06-23 NOTE — Progress Notes (Signed)
INR right at goal today at 3.2 (Goal 2-3) *Dr. Beryle Beams patient* Pt is doing well today with no complaints Pt has been stable on current dose  No unusual bleeding or bruising No diet or medication changes No missed or extra doses Will keep dose the same and see if INR returns to goal next visit Plan: No changes Continue 5mg  daily except 6mg  on Mon&Thu.   Recheck INR in 1 month on 07/20/14: Lab at 1:15 pm and coumadin clinic at 1:30 pm.

## 2014-07-20 ENCOUNTER — Other Ambulatory Visit (HOSPITAL_BASED_OUTPATIENT_CLINIC_OR_DEPARTMENT_OTHER): Payer: Medicare Other

## 2014-07-20 ENCOUNTER — Ambulatory Visit (HOSPITAL_BASED_OUTPATIENT_CLINIC_OR_DEPARTMENT_OTHER): Payer: Medicare Other | Admitting: Pharmacist

## 2014-07-20 DIAGNOSIS — K550 Acute vascular disorders of intestine: Secondary | ICD-10-CM

## 2014-07-20 DIAGNOSIS — D471 Chronic myeloproliferative disease: Secondary | ICD-10-CM

## 2014-07-20 DIAGNOSIS — K55069 Acute infarction of intestine, part and extent unspecified: Secondary | ICD-10-CM

## 2014-07-20 DIAGNOSIS — Z7901 Long term (current) use of anticoagulants: Secondary | ICD-10-CM

## 2014-07-20 LAB — PROTIME-INR
INR: 1.9 — ABNORMAL LOW (ref 2.00–3.50)
Protime: 22.8 Seconds — ABNORMAL HIGH (ref 10.6–13.4)

## 2014-07-20 LAB — POCT INR: INR: 1.9

## 2014-07-20 NOTE — Progress Notes (Signed)
INR = 1.9 on Coumadin 5 mg/day except 6 mg on Mon/Thurs. No missed doses. No sxs of VTE. Diet stable. INR essentially at goal.  We'll have her take 6 mg tomorrow (boost) then resume usual dose. Return in 1 month (lab already sched for 5/20). Kennith Center, Pharm.D., CPP 07/20/2014@1 :57 PM

## 2014-07-21 ENCOUNTER — Other Ambulatory Visit: Payer: Medicare Other

## 2014-08-18 ENCOUNTER — Ambulatory Visit (HOSPITAL_BASED_OUTPATIENT_CLINIC_OR_DEPARTMENT_OTHER): Payer: Medicare Other | Admitting: Pharmacist

## 2014-08-18 ENCOUNTER — Other Ambulatory Visit (HOSPITAL_BASED_OUTPATIENT_CLINIC_OR_DEPARTMENT_OTHER): Payer: Medicare Other

## 2014-08-18 DIAGNOSIS — K550 Acute vascular disorders of intestine: Secondary | ICD-10-CM

## 2014-08-18 DIAGNOSIS — Z7901 Long term (current) use of anticoagulants: Secondary | ICD-10-CM | POA: Diagnosis not present

## 2014-08-18 DIAGNOSIS — D471 Chronic myeloproliferative disease: Secondary | ICD-10-CM

## 2014-08-18 DIAGNOSIS — K55069 Acute infarction of intestine, part and extent unspecified: Secondary | ICD-10-CM

## 2014-08-18 LAB — CBC WITH DIFFERENTIAL/PLATELET
BASO%: 0.9 % (ref 0.0–2.0)
Basophils Absolute: 0.1 10*3/uL (ref 0.0–0.1)
EOS%: 2.4 % (ref 0.0–7.0)
Eosinophils Absolute: 0.3 10*3/uL (ref 0.0–0.5)
HCT: 40.6 % (ref 34.8–46.6)
HGB: 13.1 g/dL (ref 11.6–15.9)
LYMPH%: 15.9 % (ref 14.0–49.7)
MCH: 23.9 pg — ABNORMAL LOW (ref 25.1–34.0)
MCHC: 32.3 g/dL (ref 31.5–36.0)
MCV: 74.2 fL — ABNORMAL LOW (ref 79.5–101.0)
MONO#: 0.7 10*3/uL (ref 0.1–0.9)
MONO%: 5.9 % (ref 0.0–14.0)
NEUT#: 8.5 10*3/uL — ABNORMAL HIGH (ref 1.5–6.5)
NEUT%: 74.9 % (ref 38.4–76.8)
Platelets: 576 10*3/uL — ABNORMAL HIGH (ref 145–400)
RBC: 5.47 10*6/uL — ABNORMAL HIGH (ref 3.70–5.45)
RDW: 16.5 % — ABNORMAL HIGH (ref 11.2–14.5)
WBC: 11.4 10*3/uL — ABNORMAL HIGH (ref 3.9–10.3)
lymph#: 1.8 10*3/uL (ref 0.9–3.3)
nRBC: 0 % (ref 0–0)

## 2014-08-18 LAB — PROTIME-INR
INR: 2.2 (ref 2.00–3.50)
Protime: 26.4 Seconds — ABNORMAL HIGH (ref 10.6–13.4)

## 2014-08-18 LAB — POCT INR: INR: 2.2

## 2014-08-18 NOTE — Patient Instructions (Signed)
INR at goal No changes Continue 5mg  daily except 6mg  on Mon&Thu.   Recheck INR in 1 month on 09/13/14: Lab at 1 pm and coumadin clinic at 1:15 pm.

## 2014-08-18 NOTE — Progress Notes (Signed)
INR at goal today at 2.2 (goal 2-3) *Dr. Beryle Beams patient* Gwendolyn Williams is doing great with no issues She is looking forward to school finishing  She is going on a trip to [place] at the end of June No missed or extra doses No diet or medication changes No unusual bleeding or bruising Plans: No changes Continue 5mg  daily except 6mg  on Mon&Thu.   Recheck INR in 1 month on 09/13/14: Lab at 1 pm and coumadin clinic at 1:15 pm.

## 2014-08-25 DIAGNOSIS — R05 Cough: Secondary | ICD-10-CM | POA: Diagnosis not present

## 2014-08-25 DIAGNOSIS — B349 Viral infection, unspecified: Secondary | ICD-10-CM | POA: Diagnosis not present

## 2014-09-13 ENCOUNTER — Ambulatory Visit (HOSPITAL_BASED_OUTPATIENT_CLINIC_OR_DEPARTMENT_OTHER): Payer: Medicare Other | Admitting: Pharmacist

## 2014-09-13 ENCOUNTER — Other Ambulatory Visit (HOSPITAL_BASED_OUTPATIENT_CLINIC_OR_DEPARTMENT_OTHER): Payer: Medicare Other

## 2014-09-13 DIAGNOSIS — K55069 Acute infarction of intestine, part and extent unspecified: Secondary | ICD-10-CM

## 2014-09-13 DIAGNOSIS — D471 Chronic myeloproliferative disease: Secondary | ICD-10-CM

## 2014-09-13 DIAGNOSIS — K550 Acute vascular disorders of intestine: Secondary | ICD-10-CM | POA: Diagnosis not present

## 2014-09-13 DIAGNOSIS — Z7901 Long term (current) use of anticoagulants: Secondary | ICD-10-CM

## 2014-09-13 LAB — CBC WITH DIFFERENTIAL/PLATELET
BASO%: 0.9 % (ref 0.0–2.0)
Basophils Absolute: 0.1 10*3/uL (ref 0.0–0.1)
EOS%: 2.9 % (ref 0.0–7.0)
Eosinophils Absolute: 0.3 10*3/uL (ref 0.0–0.5)
HCT: 40.5 % (ref 34.8–46.6)
HGB: 13.1 g/dL (ref 11.6–15.9)
LYMPH%: 13.7 % — ABNORMAL LOW (ref 14.0–49.7)
MCH: 23.6 pg — ABNORMAL LOW (ref 25.1–34.0)
MCHC: 32.3 g/dL (ref 31.5–36.0)
MCV: 73 fL — ABNORMAL LOW (ref 79.5–101.0)
MONO#: 0.6 10*3/uL (ref 0.1–0.9)
MONO%: 5.6 % (ref 0.0–14.0)
NEUT#: 7.6 10*3/uL — ABNORMAL HIGH (ref 1.5–6.5)
NEUT%: 76.9 % — ABNORMAL HIGH (ref 38.4–76.8)
Platelets: 594 10*3/uL — ABNORMAL HIGH (ref 145–400)
RBC: 5.55 10*6/uL — ABNORMAL HIGH (ref 3.70–5.45)
RDW: 16.6 % — ABNORMAL HIGH (ref 11.2–14.5)
WBC: 9.9 10*3/uL (ref 3.9–10.3)
lymph#: 1.4 10*3/uL (ref 0.9–3.3)
nRBC: 0 % (ref 0–0)

## 2014-09-13 LAB — PROTIME-INR
INR: 2.2 (ref 2.00–3.50)
Protime: 26.4 Seconds — ABNORMAL HIGH (ref 10.6–13.4)

## 2014-09-13 LAB — POCT INR: INR: 2.2

## 2014-09-13 NOTE — Progress Notes (Addendum)
INR = 2.2    Goal 2-3 INR is within goal range. No complications of anticoagulation noted.. No new medications. Gwendolyn Williams is going out of town next week. We are scheduling her visits around her summer travel plans. She will return to see [place] in 4 weeks prior to her next trip. She will continue 5mg  daily except 6mg  on Mon&Thu.   Recheck INR in 1 month on 10/11/14: Lab at 1 pm and Coumadin clinic at 1:15 pm.  Theone Murdoch, PharmD  Samples given:  Coumadin 6 mg  #20  Lot: [id number]     Exp:  11/29/14

## 2014-09-15 ENCOUNTER — Other Ambulatory Visit: Payer: Medicare Other

## 2014-09-25 ENCOUNTER — Other Ambulatory Visit: Payer: Self-pay

## 2014-10-11 ENCOUNTER — Other Ambulatory Visit: Payer: Medicare Other

## 2014-10-11 ENCOUNTER — Ambulatory Visit (HOSPITAL_BASED_OUTPATIENT_CLINIC_OR_DEPARTMENT_OTHER): Payer: Medicare Other | Admitting: Pharmacist

## 2014-10-11 DIAGNOSIS — D471 Chronic myeloproliferative disease: Secondary | ICD-10-CM

## 2014-10-11 DIAGNOSIS — K550 Acute vascular disorders of intestine: Secondary | ICD-10-CM | POA: Diagnosis present

## 2014-10-11 DIAGNOSIS — K55069 Acute infarction of intestine, part and extent unspecified: Secondary | ICD-10-CM

## 2014-10-11 DIAGNOSIS — Z7901 Long term (current) use of anticoagulants: Secondary | ICD-10-CM

## 2014-10-11 LAB — PROTIME-INR
INR: 2 (ref 2.00–3.50)
Protime: 24 Seconds — ABNORMAL HIGH (ref 10.6–13.4)

## 2014-10-11 LAB — POCT INR: INR: 2

## 2014-10-11 NOTE — Addendum Note (Signed)
Addended by: Neysa Hotter on: 10/11/2014 03:05 PM   Modules accepted: Level of Service

## 2014-10-11 NOTE — Progress Notes (Signed)
INR within goal today. No problems or concerns regarding anticoagulation. No unusual bruising noted. No bleeding noted. No s/s of clotting noted. No missed or extra coumadin doses. No changes in diet or medications. No upcoming planned procedures. Continue 5mg  daily except 6mg  on Mon&Thu.   Recheck INR in 1 month on 11/10/14: Lab at 3:30pm and Coumadin clinic at 3:45pm.

## 2014-10-11 NOTE — Patient Instructions (Signed)
Continue 5mg  daily except 6mg  on Mon&Thu.   Recheck INR in 1 month on 11/10/14: Lab at 3:30pm and Coumadin clinic at 3:45pm.

## 2014-10-13 ENCOUNTER — Other Ambulatory Visit: Payer: Medicare Other

## 2014-10-16 ENCOUNTER — Encounter: Payer: Self-pay | Admitting: Genetic Counselor

## 2014-11-06 DIAGNOSIS — N183 Chronic kidney disease, stage 3 (moderate): Secondary | ICD-10-CM | POA: Diagnosis not present

## 2014-11-06 DIAGNOSIS — M810 Age-related osteoporosis without current pathological fracture: Secondary | ICD-10-CM | POA: Diagnosis not present

## 2014-11-06 DIAGNOSIS — E559 Vitamin D deficiency, unspecified: Secondary | ICD-10-CM | POA: Diagnosis not present

## 2014-11-06 DIAGNOSIS — Z853 Personal history of malignant neoplasm of breast: Secondary | ICD-10-CM | POA: Diagnosis not present

## 2014-11-06 DIAGNOSIS — C946 Myelodysplastic disease, not classified: Secondary | ICD-10-CM | POA: Diagnosis not present

## 2014-11-06 DIAGNOSIS — K219 Gastro-esophageal reflux disease without esophagitis: Secondary | ICD-10-CM | POA: Diagnosis not present

## 2014-11-06 DIAGNOSIS — I129 Hypertensive chronic kidney disease with stage 1 through stage 4 chronic kidney disease, or unspecified chronic kidney disease: Secondary | ICD-10-CM | POA: Diagnosis not present

## 2014-11-06 DIAGNOSIS — E780 Pure hypercholesterolemia: Secondary | ICD-10-CM | POA: Diagnosis not present

## 2014-11-10 ENCOUNTER — Other Ambulatory Visit (HOSPITAL_BASED_OUTPATIENT_CLINIC_OR_DEPARTMENT_OTHER): Payer: Medicare Other

## 2014-11-10 ENCOUNTER — Ambulatory Visit (HOSPITAL_BASED_OUTPATIENT_CLINIC_OR_DEPARTMENT_OTHER): Payer: Medicare Other | Admitting: Pharmacist

## 2014-11-10 DIAGNOSIS — K55069 Acute infarction of intestine, part and extent unspecified: Secondary | ICD-10-CM

## 2014-11-10 DIAGNOSIS — K550 Acute vascular disorders of intestine: Secondary | ICD-10-CM | POA: Diagnosis not present

## 2014-11-10 DIAGNOSIS — D471 Chronic myeloproliferative disease: Secondary | ICD-10-CM

## 2014-11-10 DIAGNOSIS — Z7901 Long term (current) use of anticoagulants: Secondary | ICD-10-CM

## 2014-11-10 LAB — PROTIME-INR
INR: 2.7 (ref 2.00–3.50)
Protime: 32.4 Seconds — ABNORMAL HIGH (ref 10.6–13.4)

## 2014-11-10 LAB — POCT INR: INR: 2.7

## 2014-11-10 NOTE — Patient Instructions (Signed)
INR at goal No changes Continue 5mg  daily except 6mg  on Mon&Thu.   Recheck INR in 1 month on 12/08/14: Lab at 3:30pm and Coumadin clinic at 3:45pm.

## 2014-11-10 NOTE — Progress Notes (Signed)
INR at goal today at 2.7 (goal 2-3) Gwendolyn Williams is doing well with no complaints No missed or extra doses  No diet or medication changes No unusual bleeding or bruising Gwendolyn Williams is aware of coumadin clinic closure and that she will be transitioned back to Dr. Azucena Freed office.  She would like to be seen here at [facility] coumadin clinic one more time. This is reasonable and we will see her in September on 9/9 for her last visit Plan: Continue 5mg  daily except 6mg  on Mon&Thu.   Recheck INR in 1 month on 12/08/14: Lab at 3:30pm and Coumadin clinic at 3:45pm.

## 2014-12-08 ENCOUNTER — Ambulatory Visit (HOSPITAL_BASED_OUTPATIENT_CLINIC_OR_DEPARTMENT_OTHER): Payer: Medicare Other | Admitting: Pharmacist

## 2014-12-08 ENCOUNTER — Other Ambulatory Visit (HOSPITAL_BASED_OUTPATIENT_CLINIC_OR_DEPARTMENT_OTHER): Payer: Medicare Other

## 2014-12-08 DIAGNOSIS — K550 Acute vascular disorders of intestine: Secondary | ICD-10-CM | POA: Diagnosis present

## 2014-12-08 DIAGNOSIS — Z7901 Long term (current) use of anticoagulants: Secondary | ICD-10-CM

## 2014-12-08 DIAGNOSIS — Z853 Personal history of malignant neoplasm of breast: Secondary | ICD-10-CM | POA: Diagnosis not present

## 2014-12-08 DIAGNOSIS — D471 Chronic myeloproliferative disease: Secondary | ICD-10-CM

## 2014-12-08 DIAGNOSIS — K55069 Acute infarction of intestine, part and extent unspecified: Secondary | ICD-10-CM

## 2014-12-08 DIAGNOSIS — M818 Other osteoporosis without current pathological fracture: Secondary | ICD-10-CM | POA: Diagnosis not present

## 2014-12-08 DIAGNOSIS — T386X5A Adverse effect of antigonadotrophins, antiestrogens, antiandrogens, not elsewhere classified, initial encounter: Secondary | ICD-10-CM

## 2014-12-08 LAB — CBC WITH DIFFERENTIAL/PLATELET
BASO%: 0.8 % (ref 0.0–2.0)
Basophils Absolute: 0.1 10*3/uL (ref 0.0–0.1)
EOS%: 2.4 % (ref 0.0–7.0)
Eosinophils Absolute: 0.3 10*3/uL (ref 0.0–0.5)
HCT: 36.8 % (ref 34.8–46.6)
HGB: 11.8 g/dL (ref 11.6–15.9)
LYMPH%: 12.9 % — ABNORMAL LOW (ref 14.0–49.7)
MCH: 23.1 pg — ABNORMAL LOW (ref 25.1–34.0)
MCHC: 32.1 g/dL (ref 31.5–36.0)
MCV: 72 fL — ABNORMAL LOW (ref 79.5–101.0)
MONO#: 0.6 10*3/uL (ref 0.1–0.9)
MONO%: 5.8 % (ref 0.0–14.0)
NEUT#: 8.3 10*3/uL — ABNORMAL HIGH (ref 1.5–6.5)
NEUT%: 78.1 % — ABNORMAL HIGH (ref 38.4–76.8)
Platelets: 633 10*3/uL — ABNORMAL HIGH (ref 145–400)
RBC: 5.11 10*6/uL (ref 3.70–5.45)
RDW: 17.1 % — ABNORMAL HIGH (ref 11.2–14.5)
WBC: 10.6 10*3/uL — ABNORMAL HIGH (ref 3.9–10.3)
lymph#: 1.4 10*3/uL (ref 0.9–3.3)
nRBC: 0 % (ref 0–0)

## 2014-12-08 LAB — COMPREHENSIVE METABOLIC PANEL (CC13)
ALT: 21 U/L (ref 0–55)
AST: 23 U/L (ref 5–34)
Albumin: 3.8 g/dL (ref 3.5–5.0)
Alkaline Phosphatase: 42 U/L (ref 40–150)
Anion Gap: 8 mEq/L (ref 3–11)
BUN: 27.4 mg/dL — ABNORMAL HIGH (ref 7.0–26.0)
CO2: 25 mEq/L (ref 22–29)
Calcium: 9.9 mg/dL (ref 8.4–10.4)
Chloride: 103 mEq/L (ref 98–109)
Creatinine: 1.1 mg/dL (ref 0.6–1.1)
EGFR: 51 mL/min/{1.73_m2} — ABNORMAL LOW (ref >90)
Glucose: 84 mg/dl (ref 70–140)
Potassium: 4.2 mEq/L (ref 3.5–5.1)
Sodium: 137 mEq/L (ref 136–145)
Total Bilirubin: 0.65 mg/dL (ref 0.20–1.20)
Total Protein: 6.3 g/dL — ABNORMAL LOW (ref 6.4–8.3)

## 2014-12-08 LAB — PROTIME-INR
INR: 2 (ref 2.00–3.50)
Protime: 24 Seconds — ABNORMAL HIGH (ref 10.6–13.4)

## 2014-12-08 LAB — LACTATE DEHYDROGENASE (CC13): LDH: 308 U/L — ABNORMAL HIGH (ref 125–245)

## 2014-12-08 LAB — URIC ACID (CC13): Uric Acid, Serum: 5.8 mg/dl (ref 2.6–7.4)

## 2014-12-08 NOTE — Progress Notes (Signed)
LAST COUMADIN CLINIC APPOINTMENT  INR = 2    Goal 2-3 H/H = 11.8/36.8 INR within goal range. No medication or dietary changes. She has had a few small bruises, this is unusual for her. She is concerned about a vein that is distended in her right antecubital area. The vein is soft and supple, she will ask Dr. Beryle Beams to look at it when she next sees him. Mrs. Bastedo is aware the Coumadin clinic is closing. She already has an appt to see Dr. Beryle Beams [facility]. She will continue 5mg  daily except 6mg  on Mon&Thu.   Appt with Dr. Beryle Beams on 9/20 at 08:45am.  Theone Murdoch, PharmD

## 2014-12-09 LAB — VITAMIN D 25 HYDROXY (VIT D DEFICIENCY, FRACTURES): Vit D, 25-Hydroxy: 50 ng/mL (ref 30–100)

## 2014-12-12 ENCOUNTER — Other Ambulatory Visit: Payer: Medicare Other

## 2014-12-12 ENCOUNTER — Telehealth: Payer: Self-pay | Admitting: *Deleted

## 2014-12-12 NOTE — Telephone Encounter (Signed)
Called pt - not @ home; talked to pt's husband. Informed him, pt 's Vitamin D level is normal,stated he will let her know.

## 2014-12-12 NOTE — Telephone Encounter (Signed)
-----   Message from Annia Belt, MD sent at 12/11/2014  3:38 PM EDT ----- Call pt vitamin D level normal

## 2014-12-19 ENCOUNTER — Encounter: Payer: Self-pay | Admitting: Oncology

## 2014-12-19 ENCOUNTER — Ambulatory Visit (INDEPENDENT_AMBULATORY_CARE_PROVIDER_SITE_OTHER): Payer: Medicare Other | Admitting: Oncology

## 2014-12-19 VITALS — BP 109/67 | HR 72 | Temp 98.0°F | Ht 60.0 in | Wt 101.0 lb

## 2014-12-19 DIAGNOSIS — K550 Acute vascular disorders of intestine: Secondary | ICD-10-CM | POA: Diagnosis not present

## 2014-12-19 DIAGNOSIS — D471 Chronic myeloproliferative disease: Secondary | ICD-10-CM

## 2014-12-19 DIAGNOSIS — Z7901 Long term (current) use of anticoagulants: Secondary | ICD-10-CM | POA: Diagnosis not present

## 2014-12-19 DIAGNOSIS — Z853 Personal history of malignant neoplasm of breast: Secondary | ICD-10-CM

## 2014-12-19 DIAGNOSIS — I10 Essential (primary) hypertension: Secondary | ICD-10-CM

## 2014-12-19 DIAGNOSIS — D473 Essential (hemorrhagic) thrombocythemia: Secondary | ICD-10-CM | POA: Diagnosis not present

## 2014-12-19 DIAGNOSIS — K55069 Acute infarction of intestine, part and extent unspecified: Secondary | ICD-10-CM

## 2014-12-19 DIAGNOSIS — D509 Iron deficiency anemia, unspecified: Secondary | ICD-10-CM | POA: Diagnosis not present

## 2014-12-19 DIAGNOSIS — T386X5A Adverse effect of antigonadotrophins, antiestrogens, antiandrogens, not elsewhere classified, initial encounter: Secondary | ICD-10-CM

## 2014-12-19 DIAGNOSIS — Z7982 Long term (current) use of aspirin: Secondary | ICD-10-CM | POA: Diagnosis not present

## 2014-12-19 DIAGNOSIS — M818 Other osteoporosis without current pathological fracture: Secondary | ICD-10-CM

## 2014-12-19 MED ORDER — COUMADIN 5 MG PO TABS: ORAL_TABLET | ORAL | Status: DC

## 2014-12-19 MED ORDER — WARFARIN SODIUM 6 MG PO TABS: ORAL_TABLET | ORAL | Status: DC

## 2014-12-19 NOTE — Patient Instructions (Signed)
New Patient Appointment with [facility] for Monday October 10. Point of care PT/INR Continue monthly PT/INR MD visit 4 months lab 1 week before visit

## 2014-12-19 NOTE — Progress Notes (Signed)
Patient ID: Gwendolyn Williams, female   DOB: 02-Jul-1940, 74 y.o.   MRN: [id number] Hematology and Oncology Follow Up Visit  Gwendolyn Williams [id number] [birth year] 74 y.o. 12/19/2014 9:50 AM   Principle Diagnosis: Encounter Diagnoses  Name Primary?  . Myeloproliferative disorder Yes  . History of breast cancer in female   . Mesenteric thrombosis   . 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 Anemia, iron deficiency   . 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 She has been on full dose Coumadin anticoagulation and low-dose aspirin since that time. Please see previous office progress notes for additional details. She was subsequently diagnosed with a stage I, ER/PR positive, HER-2-negative, cancer of the left breast in August 2010. Oncotype DX score high risk. She underwent left lumpectomy on 12/26/2008 followed by adjuvant chemotherapy with 4 cycles of Taxotere plus Cytoxan between 02/16/2009 and 04/20/2009. She then had a course of radiation between February 14 and 07/02/2009. She was started on hormonal therapy with Arimidex which she continues at this time. BRCA1 and BRCA2 gene testing was negative. She has one son. One daughter age 24(2016). She developed a large chest wall hematoma at time of the surgery related to tight anticoagulation which has long since resolved.  Interim History:  She has lost 6 pounds since her visit here in March. She states that she feels good at this weight. Looking at today's lab, I notice slowly progressive microcytic anemia. I had her on iron replacement in the past for the same problem with a complete response. She informs me that she stopped taking the iron. She has had no interim medical  problems. She continues on full dose Coumadin currently 5 mg daily except 6 mg on Mondays and Thursdays. Most recent INR 2.0 on 12/05/2014. No headache or change in vision. No cardiorespiratory complaints. No GI complaints. No new breast lumps. She is due for a annual mammogram. She had a follow-up DEXA scan. Not yet scanned into the system. As I recall there were no major changes. A vitamin D level done 12/08/2014 in normal range at 50.  Medications: reviewed  Allergies:  Allergies  Allergen Reactions  . Penicillins   . Sulfa Antibiotics     Unable to recall    Review of Systems: See history of present illness Remaining ROS negative:   Physical Exam: Blood pressure 109/67, pulse 72, temperature 98 F (36.7 C), temperature source Oral, height 5' (1.524 m), weight 101 lb (45.813 kg), SpO2 100 %. Wt Readings from Last 3 Encounters:  12/19/14 101 lb (45.813 kg)  06/12/14 107 lb (48.535 kg)  12/06/13 107 lb 4.8 oz (48.671 kg)     General appearance: Thin Caucasian woman [name]: Pharynx no erythema, exudate, mass, or ulcer. No thyromegaly or thyroid nodules Lymph nodes: No cervical, supraclavicular, or axillary lymphadenopathy Breasts: Examined at time of March visit. Not repeated todayghout Heart: Regular rhythm, no murmur, no gallop, no rub, no click, no edema Abdomen: Soft, nontender, normal bowel sounds, no mass, no organomegaly Extremities: No edema, no calf tenderness Musculoskeletal: no joint deformities GU:  Vascular: Carotid pulses 2+, no bruits, Neurologic: Alert, oriented, PERRLA, optic discs sharp and vessels normal, no hemorrhage or exudate, cranial nerves grossly normal, motor strength 5 over 5, reflexes 1+ symmetric, upper body coordination normal, gait normal, Skin: No rash  or ecchymosis  Lab Results: CBC W/Diff    Component Value Date/Time   WBC 10.6* 12/08/2014 1540   WBC 7.1 06/06/2014 1002   RBC 5.11 12/08/2014 1540   RBC 5.81* 06/06/2014 1002   HGB 11.8  12/08/2014 1540   HGB 13.9 06/06/2014 1002   HCT 36.8 12/08/2014 1540   HCT 44.8 06/06/2014 1002   PLT 633* 12/08/2014 1540   PLT 546* 06/06/2014 1002   MCV 72.0* 12/08/2014 1540   MCV 77.1* 06/06/2014 1002   MCH 23.1* 12/08/2014 1540   MCH 23.9* 06/06/2014 1002   MCHC 32.1 12/08/2014 1540   MCHC 31.0 06/06/2014 1002   RDW 17.1* 12/08/2014 1540   RDW 15.9* 06/06/2014 1002   LYMPHSABS 1.4 12/08/2014 1540   LYMPHSABS 1.3 06/06/2014 1002   MONOABS 0.6 12/08/2014 1540   MONOABS 0.4 06/06/2014 1002   EOSABS 0.3 12/08/2014 1540   EOSABS 0.2 06/06/2014 1002   BASOSABS 0.1 12/08/2014 1540   BASOSABS 0.1 06/06/2014 1002     Chemistry      Component Value Date/Time   NA 137 12/08/2014 1540   NA 137 06/06/2014 1002   K 4.2 12/08/2014 1540   K 4.4 06/06/2014 1002   CL 101 06/06/2014 1002   CL 105 08/03/2012 1529   CO2 25 12/08/2014 1540   CO2 29 06/06/2014 1002   BUN 27.4* 12/08/2014 1540   BUN 34* 06/06/2014 1002   CREATININE 1.1 12/08/2014 1540   CREATININE 1.18* 06/06/2014 1002   CREATININE 0.88 08/04/2011 1537      Component Value Date/Time   CALCIUM 9.9 12/08/2014 1540   CALCIUM 9.9 06/06/2014 1002   ALKPHOS 42 12/08/2014 1540   ALKPHOS 38* 06/06/2014 1002   AST 23 12/08/2014 1540   AST 16 06/06/2014 1002   ALT 21 12/08/2014 1540   ALT 11 06/06/2014 1002   BILITOT 0.65 12/08/2014 1540   BILITOT 0.5 06/06/2014 1002       Radiological Studies: No results found.  Impression:   #1. Myeloproliferative disorder: Essential thrombocythemia Platelet count is trending up for last 2 values now over 500,000. She has not required a platelet lowering drug up to this point. I will continue to monitor blood counts every 3 months. If platelet count continues to rise, then I will start her on Hydrea. Given the slowly progressive and now microcytic anemia I suspect that one component of her elevated platelet count relates to recurrent iron deficiency anemia.  #2. 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 No subsequent thrombotic events on full dose Coumadin and low-dose aspirin. These will be continued indefinitely.  #3. Stage I, ER positive, HER-2 negative, Oncotype DX high risk cancer of the left breast treated with chemotherapy and now on hormonal therapy with Arimidex. She has now been on the drug for 5 years. We discussed that overall for a good risk patient our professional society recommends stopping treatment at 5 years until there are comparative data available looking at 5 versus 10 years. However she is tolerating the drug well, and now that we have a genetic profile that shows she is actually high risk based on her Oncotype DX score, I do not think it is unreasonable to extend therapy for an additional 5 years and she is in agreement with this. Recent update at the May 2016 [place] of clinical oncology meeting which I attended, suggest that there is a slight benefit to extending aromatase inhibitors for 10 years.  #4. Essential hypertension  #5. Recurrent iron deficiency  anemia. Although she states that her weight loss is delivered, I am concerned. I told her that she needs to get a follow-up colonoscopy. I will start her back on ferrous sulfate 325 mg twice daily.  CC: Patient Care Team: Gaynelle Arabian, MD as PCP - General (Family Medicine) Annia Belt, MD as Consulting Physician (Oncology) Rolm Bookbinder, MD as Consulting Physician (General Surgery)   Annia Belt, MD 9/20/20169:50 AM

## 2014-12-20 ENCOUNTER — Other Ambulatory Visit: Payer: Self-pay | Admitting: Oncology

## 2014-12-20 DIAGNOSIS — R634 Abnormal weight loss: Secondary | ICD-10-CM

## 2014-12-20 DIAGNOSIS — D509 Iron deficiency anemia, unspecified: Secondary | ICD-10-CM

## 2014-12-20 DIAGNOSIS — D471 Chronic myeloproliferative disease: Secondary | ICD-10-CM

## 2014-12-22 DIAGNOSIS — Z7901 Long term (current) use of anticoagulants: Secondary | ICD-10-CM | POA: Diagnosis not present

## 2014-12-22 DIAGNOSIS — D649 Anemia, unspecified: Secondary | ICD-10-CM | POA: Diagnosis not present

## 2014-12-22 DIAGNOSIS — C946 Myelodysplastic disease, not classified: Secondary | ICD-10-CM | POA: Diagnosis not present

## 2014-12-26 ENCOUNTER — Telehealth: Payer: Self-pay | Admitting: *Deleted

## 2014-12-26 NOTE — Telephone Encounter (Signed)
Pt's husband states pt has an appt w/Dr Amedeo Plenty on 10/24 and wants to discuss with u about her going off Coumadin,stopping it  And/or switching to Lovenox. [phone] Thanks

## 2015-01-01 ENCOUNTER — Other Ambulatory Visit: Payer: Self-pay | Admitting: *Deleted

## 2015-01-01 NOTE — Telephone Encounter (Signed)
She needs to stay on lovenox until coumadin is therapeutic again. He is a Software engineer - thought he would know this,

## 2015-01-01 NOTE — Telephone Encounter (Signed)
Pt 's husband stated you had called and gave pt a schedule on when to stop/start Coumadin /Lovenox . But he said you did not tell him how long she needs to be  on Lovenox. And new Lovenox rx sent to [facility] on [address]. Thanks

## 2015-01-02 ENCOUNTER — Other Ambulatory Visit (INDEPENDENT_AMBULATORY_CARE_PROVIDER_SITE_OTHER): Payer: Medicare Other | Admitting: Oncology

## 2015-01-02 DIAGNOSIS — Z7901 Long term (current) use of anticoagulants: Secondary | ICD-10-CM

## 2015-01-02 DIAGNOSIS — D471 Chronic myeloproliferative disease: Secondary | ICD-10-CM

## 2015-01-02 DIAGNOSIS — Z853 Personal history of malignant neoplasm of breast: Secondary | ICD-10-CM

## 2015-01-02 MED ORDER — ENOXAPARIN SODIUM 60 MG/0.6ML ~~LOC~~ SOLN: 60.0000 mg | SUBCUTANEOUS | Status: DC

## 2015-01-02 NOTE — Telephone Encounter (Signed)
Pt's husband informed she needs to stay on Lovenox until her Coumadin level is therapeutic per Dr Beryle Beams. He voiced understanding. Also needs rx for Generic Lovenox.

## 2015-01-05 ENCOUNTER — Other Ambulatory Visit: Payer: Medicare Other

## 2015-01-08 ENCOUNTER — Ambulatory Visit: Payer: Medicare Other | Admitting: Pharmacist

## 2015-01-08 DIAGNOSIS — K55069 Acute infarction of intestine, part and extent unspecified: Secondary | ICD-10-CM

## 2015-01-08 LAB — POCT INR: INR: 2

## 2015-01-08 NOTE — Progress Notes (Signed)
Anti-Coagulation Progress Note  Gwendolyn Williams is a 74 y.o. female who is currently on an anti-coagulation regimen.    RECENT RESULTS: Recent results are below, the most recent result is correlated with a dose of 37 mg. per week: Lab Results  Component Value Date   INR 2.00 01/08/2015   INR 2.00 12/08/2014   INR 2.70 11/10/2014   PROTIME 24.0* 12/08/2014    ANTI-COAG DOSE: Anticoagulation Dose Instructions as of 01/08/2015      Gwendolyn Williams Tue Wed Thu Fri Sat   New Dose 5 mg 7.5 mg 5 mg 7.5 mg 5 mg 7.5 mg 5 mg    Description        Continue warfarin using FIVE MILLIGRAM TABLETS (5mg ) as follows:  MWF take 1&1/2 x 5mg  (7.5mg ); all other days of week, take 1x5mg  (5mg ).        ANTICOAG SUMMARY: Anticoagulation Episode Summary    Current INR goal 2.0-3.0  Next INR check 01/21/2015  INR from last check 2.00 (01/08/2015)  Weekly max dose   Target end date   INR check location   Preferred lab   Send INR reminders to [place]   Indications  Mesenteric thrombosis [facility]) [K55.069]        Comments       Anticoagulation Care Providers    Provider Role Specialty Phone number   Annia Belt, MD Referring Oncology [phone]      ANTICOAG TODAY: Anticoagulation Summary as of 01/08/2015    INR goal 2.0-3.0  Selected INR 2.00 (01/08/2015)  Next INR check 01/21/2015  Target end date    Indications  Mesenteric thrombosis ([place]) [K55.069]      Anticoagulation Episode Summary    INR check location    Preferred lab    Send INR reminders to [place]   Comments     Anticoagulation Care Providers    Provider Role Specialty Phone number   Annia Belt, MD Referring Oncology [phone]      PATIENT INSTRUCTIONS: Patient Instructions  Patient instructed to take medications as defined in the Anti-coagulation Track section of this encounter.  Patient instructed to take today's dose.  Patient verbalized understanding of these instructions.        FOLLOW-UP Return in 2 weeks (on 01/21/2015) for Follow up INR at 1000AM SUNDAY 23-OCT-16 [name] will meet patient at [facility] to perform.[name], III Pharm.D., CACP

## 2015-01-08 NOTE — Patient Instructions (Signed)
Patient instructed to take medications as defined in the Anti-coagulation Track section of this encounter.  Patient instructed to take today's dose.  Patient verbalized understanding of these instructions.    

## 2015-01-10 NOTE — Progress Notes (Signed)
INTERNAL MEDICINE TEACHING ATTENDING ADDENDUM - Lalla Brothers M.D  Duration- indefinite, Indication- mesenteric thrombosis, INR- 2.0. Agree with pharmacy recommendations as outlined in their note.

## 2015-01-15 ENCOUNTER — Telehealth: Payer: Self-pay | Admitting: Pharmacist

## 2015-01-15 DIAGNOSIS — Z853 Personal history of malignant neoplasm of breast: Secondary | ICD-10-CM | POA: Diagnosis not present

## 2015-01-15 NOTE — Telephone Encounter (Signed)
Patient called wanting confirmation of bridging instructions as last provided by Dr. Beryle Beams. These were reiterated to her and she states she understands them after confirmation. I will still see patient on 23-OCT-16 at 1000h for confirmatory INR.

## 2015-01-16 ENCOUNTER — Encounter: Payer: Self-pay | Admitting: Gynecology

## 2015-01-16 NOTE — Telephone Encounter (Signed)
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

## 2015-01-21 ENCOUNTER — Telehealth: Payer: Self-pay | Admitting: Pharmacist

## 2015-01-21 DIAGNOSIS — Z7901 Long term (current) use of anticoagulants: Secondary | ICD-10-CM

## 2015-01-21 NOTE — Telephone Encounter (Signed)
Reviewed Thanks DrG 

## 2015-01-21 NOTE — Telephone Encounter (Signed)
Saw patient at 0956h on Sunday 23-OCT-16 for confirmatory INR prior to procedure tomorrow. INR = 1.1 Off warfarin since 19-OCT-16. Last dose of Lovenox was AM dose of 22-OCT-16 per Dr. Azucena Freed instructions to patient.

## 2015-01-22 DIAGNOSIS — K5730 Diverticulosis of large intestine without perforation or abscess without bleeding: Secondary | ICD-10-CM | POA: Diagnosis not present

## 2015-01-22 DIAGNOSIS — D509 Iron deficiency anemia, unspecified: Secondary | ICD-10-CM | POA: Diagnosis not present

## 2015-01-22 DIAGNOSIS — Z980 Intestinal bypass and anastomosis status: Secondary | ICD-10-CM | POA: Diagnosis not present

## 2015-01-29 ENCOUNTER — Ambulatory Visit (INDEPENDENT_AMBULATORY_CARE_PROVIDER_SITE_OTHER): Payer: Medicare Other | Admitting: Pharmacist

## 2015-01-29 DIAGNOSIS — K55069 Acute infarction of intestine, part and extent unspecified: Secondary | ICD-10-CM

## 2015-01-29 DIAGNOSIS — Z7901 Long term (current) use of anticoagulants: Secondary | ICD-10-CM

## 2015-01-29 LAB — POCT INR: INR: 2.7

## 2015-01-29 NOTE — Progress Notes (Signed)
Anti-Coagulation Progress Note  Gwendolyn Williams is a 74 y.o. female who is currently on an anti-coagulation regimen.    RECENT RESULTS: Recent results are below, the most recent result is correlated with a dose of 42.5 mg. per week: Lab Results  Component Value Date   INR 2.70 01/29/2015   INR 2.00 01/08/2015   INR 2.00 12/08/2014   PROTIME 24.0* 12/08/2014    ANTI-COAG DOSE: Anticoagulation Dose Instructions as of 01/29/2015      Dorene Grebe Tue Wed Thu Fri Sat   New Dose 5 mg 7.5 mg 5 mg 7.5 mg 5 mg 7.5 mg 5 mg    Description        Continue warfarin using FIVE MILLIGRAM TABLETS (5mg ) as follows:  MWF take 1&1/2 x 5mg  (7.5mg ); all other days of week, take 1x5mg  (5mg ).        ANTICOAG SUMMARY: Anticoagulation Episode Summary    Current INR goal 2.0-3.0  Next INR check 02/26/2015  INR from last check 2.70 (01/29/2015)  Weekly max dose   Target end date   INR check location   Preferred lab   Send INR reminders to [facility]   Indications  Mesenteric thrombosis [facility]) [K55.069]        Comments       Anticoagulation Care Providers    Provider Role Specialty Phone number   Annia Belt, MD Referring Oncology [phone]      ANTICOAG TODAY: Anticoagulation Summary as of 01/29/2015    INR goal 2.0-3.0  Selected INR 2.70 (01/29/2015)  Next INR check 02/26/2015  Target end date    Indications  Mesenteric thrombosis ([place]) [K55.069]      Anticoagulation Episode Summary    INR check location    Preferred lab    Send INR reminders to [place]   Comments     Anticoagulation Care Providers    Provider Role Specialty Phone number   Annia Belt, MD Referring Oncology [phone]      PATIENT INSTRUCTIONS: Patient Instructions  Patient instructed to take medications as defined in the Anti-coagulation Track section of this encounter.  Patient instructed to take today's dose.  Patient verbalized understanding of these  instructions.       FOLLOW-UP Return in 4 weeks (on 02/26/2015) for Follow up INR at 0930h.  [name], III Pharm.D., CACP

## 2015-01-29 NOTE — Progress Notes (Signed)
Indication: Essential thrombocythemia with mesenteric thrombosis. Duration: Lifelong. INR: At target. Agree with Dr. Gladstone Pih assessment and plan.

## 2015-01-29 NOTE — Patient Instructions (Signed)
Patient instructed to take medications as defined in the Anti-coagulation Track section of this encounter.  Patient instructed to take today's dose.  Patient verbalized understanding of these instructions.    

## 2015-02-02 ENCOUNTER — Other Ambulatory Visit: Payer: Medicare Other

## 2015-02-26 ENCOUNTER — Ambulatory Visit (INDEPENDENT_AMBULATORY_CARE_PROVIDER_SITE_OTHER): Payer: Medicare Other | Admitting: Pharmacist

## 2015-02-26 DIAGNOSIS — D471 Chronic myeloproliferative disease: Secondary | ICD-10-CM | POA: Diagnosis present

## 2015-02-26 DIAGNOSIS — D509 Iron deficiency anemia, unspecified: Secondary | ICD-10-CM

## 2015-02-26 DIAGNOSIS — M818 Other osteoporosis without current pathological fracture: Secondary | ICD-10-CM

## 2015-02-26 DIAGNOSIS — K55069 Acute infarction of intestine, part and extent unspecified: Secondary | ICD-10-CM

## 2015-02-26 DIAGNOSIS — Z7901 Long term (current) use of anticoagulants: Secondary | ICD-10-CM

## 2015-02-26 DIAGNOSIS — Z853 Personal history of malignant neoplasm of breast: Secondary | ICD-10-CM

## 2015-02-26 DIAGNOSIS — T386X5A Adverse effect of antigonadotrophins, antiestrogens, antiandrogens, not elsewhere classified, initial encounter: Secondary | ICD-10-CM

## 2015-02-26 LAB — POCT INR: INR: 3

## 2015-02-26 MED ORDER — COUMADIN 5 MG PO TABS: ORAL_TABLET | ORAL | Status: DC

## 2015-02-26 NOTE — Patient Instructions (Signed)
Patient instructed to take medications as defined in the Anti-coagulation Track section of this encounter.  Patient instructed to take today's dose.  Patient verbalized understanding of these instructions.    

## 2015-02-26 NOTE — Progress Notes (Signed)
Anti-Coagulation Progress Note  Gwendolyn Williams is a 74 y.o. female who is currently on an anti-coagulation regimen.    RECENT RESULTS: Recent results are below, the most recent result is correlated with a dose of 42.5 mg. per week: Lab Results  Component Value Date   INR 3.00 02/26/2015   INR 2.70 01/29/2015   INR 2.00 01/08/2015   PROTIME 24.0* 12/08/2014    ANTI-COAG DOSE: Anticoagulation Dose Instructions as of 02/26/2015      Dorene Grebe Tue Wed Thu Fri Sat   New Dose 5 mg 7.5 mg 5 mg 5 mg 7.5 mg 5 mg 5 mg       ANTICOAG SUMMARY: Anticoagulation Episode Summary    Current INR goal 2.0-3.0  Next INR check 03/29/2015  INR from last check 3.00 (02/26/2015)  Weekly max dose   Target end date   INR check location   Preferred lab   Send INR reminders to [facility]   Indications  Mesenteric thrombosis [facility]) [K55.069]        Comments       Anticoagulation Care Providers    Provider Role Specialty Phone number   Annia Belt, MD Referring Oncology [phone]      ANTICOAG TODAY: Anticoagulation Summary as of 02/26/2015    INR goal 2.0-3.0  Selected INR 3.00 (02/26/2015)  Next INR check 03/29/2015  Target end date    Indications  Mesenteric thrombosis ([place]) [K55.069]      Anticoagulation Episode Summary    INR check location    Preferred lab    Send INR reminders to [place]   Comments     Anticoagulation Care Providers    Provider Role Specialty Phone number   Annia Belt, MD Referring Oncology [phone]      PATIENT INSTRUCTIONS: Patient Instructions  Patient instructed to take medications as defined in the Anti-coagulation Track section of this encounter.  Patient instructed to take today's dose.  Patient verbalized understanding of these instructions.       FOLLOW-UP Return in 4 weeks (on 03/29/2015) for Follow up INR at [facility] Dr. Elie Confer aware-Thursday appointment .  [name], III Pharm.D., CACP

## 2015-02-28 ENCOUNTER — Encounter: Payer: Self-pay | Admitting: Gynecology

## 2015-02-28 ENCOUNTER — Ambulatory Visit (INDEPENDENT_AMBULATORY_CARE_PROVIDER_SITE_OTHER): Payer: Medicare Other | Admitting: Gynecology

## 2015-02-28 VITALS — BP 128/80 | Ht 60.0 in | Wt 103.0 lb

## 2015-02-28 DIAGNOSIS — M810 Age-related osteoporosis without current pathological fracture: Secondary | ICD-10-CM | POA: Diagnosis not present

## 2015-02-28 DIAGNOSIS — N952 Postmenopausal atrophic vaginitis: Secondary | ICD-10-CM

## 2015-02-28 DIAGNOSIS — Z01419 Encounter for gynecological examination (general) (routine) without abnormal findings: Secondary | ICD-10-CM | POA: Diagnosis not present

## 2015-02-28 DIAGNOSIS — C50911 Malignant neoplasm of unspecified site of right female breast: Secondary | ICD-10-CM | POA: Diagnosis not present

## 2015-02-28 NOTE — Progress Notes (Signed)
Gwendolyn Williams Jun 18, 1940 [id number]        74 y.o.  G2P0000  New patient, former patient of Dr. Carren Rang for breast and pelvic exam. Several issues noted below.  Past medical history,surgical history, problem list, medications, allergies, family history and social history were all reviewed and documented as reviewed in the EPIC chart.  ROS:  Performed with pertinent positives and negatives included in the history, assessment and plan.   Additional significant findings :  none   Exam: [name] Vitals:   02/28/15 0822  BP: 128/80  Height: 5' (1.524 m)  Weight: 103 lb (46.72 kg)   General appearance:  Normal affect, orientation and appearance. Skin: Grossly normal HEENT: Without gross lesions.  No cervical or supraclavicular adenopathy. Thyroid normal.  Lungs:  Clear without wheezing, rales or rhonchi Cardiac: RR, without RMG Abdominal:  Soft, nontender, without masses, guarding, rebound, organomegaly or hernia Breasts:  Examined lying and sitting without masses, retractions, discharge or axillary adenopathy.  Left with old lumpectomy scarring Pelvic:  Ext/BUS/vagina with atrophic changes  Cervix with atrophic changes  Uterus anteverted, normal size, shape and contour, midline and mobile nontender   Adnexa  Without masses or tenderness    Anus and perineum  Normal   Rectovaginal  Normal sphincter tone without palpated masses or tenderness.    Assessment/Plan:  74 y.o. G2P0000 female for breast and pelvic exam  1. Postmenopausal/atrophic genital changes. Without significant hot flushes, night sweats, vaginal dryness or any vaginal bleeding. Continue monitoring report any issues or vaginal bleeding. 2. Left breast cancer history. Exam NED.  Mammography 12/2014. Continue with annual mammography. Continue follow up with oncology. Currently on aromatase inhibitor. 3. 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 DEXA 2015. Followed by oncology for this  on no medication. 4. Pap smear 2014 4 2015 by Dr. Carren Rang. No Pap smear done today.  No history of abnormal Pap smears.  Options to stop screening based on age or less frequent screening intervals reviewed. Will reevaluate on an annual basis. 5. Colonoscopy 2016. Repeat at their recommended interval. 6. Health maintenance. No routine blood work done as this is done at her primary physician's office. Follow up 1 year, sooner as needed.   Anastasio Auerbach MD, 8:58 AM 02/28/2015

## 2015-02-28 NOTE — Patient Instructions (Signed)

## 2015-03-02 ENCOUNTER — Other Ambulatory Visit: Payer: Medicare Other

## 2015-03-03 DIAGNOSIS — Z23 Encounter for immunization: Secondary | ICD-10-CM | POA: Diagnosis not present

## 2015-03-15 NOTE — Progress Notes (Signed)
I have reviewed Dr. Gladstone Pih note.  Patient is on [facility] for mesenteric thrombosis.  INR 3 and coumadin was decreased.

## 2015-03-29 ENCOUNTER — Ambulatory Visit (INDEPENDENT_AMBULATORY_CARE_PROVIDER_SITE_OTHER): Payer: Medicare Other | Admitting: Pharmacist

## 2015-03-29 DIAGNOSIS — Z7901 Long term (current) use of anticoagulants: Secondary | ICD-10-CM | POA: Diagnosis not present

## 2015-03-29 DIAGNOSIS — K55069 Acute infarction of intestine, part and extent unspecified: Secondary | ICD-10-CM

## 2015-03-29 LAB — POCT INR: INR: 2.8

## 2015-03-29 NOTE — Patient Instructions (Signed)
Patient instructed to take medications as defined in the Anti-coagulation Track section of this encounter.  Patient instructed to take today's dose.  Patient verbalized understanding of these instructions.    

## 2015-03-29 NOTE — Progress Notes (Signed)
Reviewed thx DrG 

## 2015-03-29 NOTE — Progress Notes (Signed)
Anti-Coagulation Progress Note  BRELAND KILCREASE is a 74 y.o. female who is currently on an anti-coagulation regimen.    RECENT RESULTS: Recent results are below, the most recent result is correlated with a dose of 40 mg. per week: Lab Results  Component Value Date   INR 2.80 03/29/2015   INR 3.00 02/26/2015   INR 2.70 01/29/2015   PROTIME 24.0* 12/08/2014    ANTI-COAG DOSE: Anticoagulation Dose Instructions as of 03/29/2015      Dorene Grebe Tue Wed Thu Fri Sat   New Dose 5 mg 7.5 mg 5 mg 5 mg 7.5 mg 5 mg 5 mg       ANTICOAG SUMMARY: Anticoagulation Episode Summary    Current INR goal 2.0-3.0  Next INR check 04/30/2015  INR from last check 2.80 (03/29/2015)  Weekly max dose   Target end date   INR check location   Preferred lab   Send INR reminders to [facility]K [facility]   Indications  Mesenteric thrombosis [facility]) [K55.069]        Comments       Anticoagulation Care Providers    Provider Role Specialty Phone number   Annia Belt, MD Referring Oncology [phone]      ANTICOAG TODAY: Anticoagulation Summary as of 03/29/2015    INR goal 2.0-3.0  Selected INR 2.80 (03/29/2015)  Next INR check 04/30/2015  Target end date    Indications  Mesenteric thrombosis ([place]) [K55.069]      Anticoagulation Episode Summary    INR check location    Preferred lab    Send INR reminders to [place]   Comments     Anticoagulation Care Providers    Provider Role Specialty Phone number   Annia Belt, MD Referring Oncology [phone]      PATIENT INSTRUCTIONS: Patient Instructions  Patient instructed to take medications as defined in the Anti-coagulation Track section of this encounter.  Patient instructed to take today's dose.  Patient verbalized understanding of these instructions.       FOLLOW-UP Return in 5 weeks (on 04/30/2015) for Follow up INR at 0930h.  [name], III Pharm.D., CACP

## 2015-03-30 ENCOUNTER — Other Ambulatory Visit: Payer: Medicare Other

## 2015-04-30 ENCOUNTER — Ambulatory Visit (INDEPENDENT_AMBULATORY_CARE_PROVIDER_SITE_OTHER): Payer: Medicare Other | Admitting: Pharmacist

## 2015-04-30 ENCOUNTER — Other Ambulatory Visit (INDEPENDENT_AMBULATORY_CARE_PROVIDER_SITE_OTHER): Payer: Medicare Other

## 2015-04-30 DIAGNOSIS — Z853 Personal history of malignant neoplasm of breast: Secondary | ICD-10-CM

## 2015-04-30 DIAGNOSIS — D509 Iron deficiency anemia, unspecified: Secondary | ICD-10-CM | POA: Diagnosis not present

## 2015-04-30 DIAGNOSIS — Z7901 Long term (current) use of anticoagulants: Secondary | ICD-10-CM

## 2015-04-30 DIAGNOSIS — D471 Chronic myeloproliferative disease: Secondary | ICD-10-CM | POA: Diagnosis not present

## 2015-04-30 DIAGNOSIS — K55069 Acute infarction of intestine, part and extent unspecified: Secondary | ICD-10-CM

## 2015-04-30 DIAGNOSIS — M818 Other osteoporosis without current pathological fracture: Secondary | ICD-10-CM | POA: Diagnosis not present

## 2015-04-30 DIAGNOSIS — T386X5A Adverse effect of antigonadotrophins, antiestrogens, antiandrogens, not elsewhere classified, initial encounter: Secondary | ICD-10-CM | POA: Diagnosis not present

## 2015-04-30 LAB — POCT INR: INR: 3

## 2015-04-30 LAB — SAVE SMEAR

## 2015-04-30 NOTE — Progress Notes (Signed)
Anti-Coagulation Progress Note  Gwendolyn Williams is a 75 y.o. female who is currently on an anti-coagulation regimen.    RECENT RESULTS: Recent results are below, the most recent result is correlated with a dose of 40 mg. per week: Lab Results  Component Value Date   INR 3.0 04/30/2015   INR 2.80 03/29/2015   INR 3.00 02/26/2015   PROTIME 24.0* 12/08/2014    ANTI-COAG DOSE: Anticoagulation Dose Instructions as of 04/30/2015      Dorene Grebe Tue Wed Thu Fri Sat   New Dose 5 mg 5 mg 5 mg 7.5 mg 5 mg 5 mg 5 mg       ANTICOAG SUMMARY: Anticoagulation Episode Summary    Current INR goal 2.0-3.0  Next INR check 05/28/2015  INR from last check 3.0 (04/30/2015)  Weekly max dose   Target end date   INR check location   Preferred lab   Send INR reminders to [facility] FR[facility] DESK [facility]OL   Indications  Mesenteric thrombosis [facility]) [K55.069]        Comments       Anticoagulation Care Providers    Provider Role Specialty Phone number   Annia Belt, MD Referring Oncology [phone]      ANTICOAG TODAY: Anticoagulation Summary as of 04/30/2015    INR goal 2.0-3.0  Selected INR 3.0 (04/30/2015)  Next INR check 05/28/2015  Target end date    Indications  Mesenteric thrombosis ([place]) [K55.069]      Anticoagulation Episode Summary    INR check location    Preferred lab    Send INR reminders to [place]   Comments     Anticoagulation Care Providers    Provider Role Specialty Phone number   Annia Belt, MD Referring Oncology [phone]      PATIENT INSTRUCTIONS: Patient Instructions  Patient instructed to take medications as defined in the Anti-coagulation Track section of this encounter.  Patient instructed to take today's dose.  Patient verbalized understanding of these instructions.       FOLLOW-UP Return in 4 weeks (on 05/28/2015) for Follow up INR at 0915h.  [name], III Pharm.D., CACP

## 2015-04-30 NOTE — Progress Notes (Signed)
Reviewed Thanks DrG 

## 2015-04-30 NOTE — Patient Instructions (Signed)
Patient instructed to take medications as defined in the Anti-coagulation Track section of this encounter.  Patient instructed to take today's dose.  Patient verbalized understanding of these instructions.    

## 2015-05-01 LAB — COMPREHENSIVE METABOLIC PANEL
ALT: 18 IU/L (ref 0–32)
AST: 21 IU/L (ref 0–40)
Albumin/Globulin Ratio: 2.3 (ref 1.1–2.5)
Albumin: 4.3 g/dL (ref 3.5–4.8)
Alkaline Phosphatase: 41 IU/L (ref 39–117)
BUN/Creatinine Ratio: 20 (ref 11–26)
BUN: 21 mg/dL (ref 8–27)
Bilirubin Total: 0.4 mg/dL (ref 0.0–1.2)
CO2: 23 mmol/L (ref 18–29)
Calcium: 9.5 mg/dL (ref 8.7–10.3)
Chloride: 100 mmol/L (ref 96–106)
Creatinine, Ser: 1.05 mg/dL — ABNORMAL HIGH (ref 0.57–1.00)
GFR calc Af Amer: 61 mL/min/{1.73_m2} (ref >59)
GFR calc non Af Amer: 53 mL/min/{1.73_m2} — ABNORMAL LOW (ref >59)
Globulin, Total: 1.9 g/dL (ref 1.5–4.5)
Glucose: 57 mg/dL — ABNORMAL LOW (ref 65–99)
Potassium: 4 mmol/L (ref 3.5–5.2)
Sodium: 140 mmol/L (ref 134–144)
Total Protein: 6.2 g/dL (ref 6.0–8.5)

## 2015-05-01 LAB — CBC WITH DIFFERENTIAL/PLATELET
Basophils Absolute: 0.1 10*3/uL (ref 0.0–0.2)
Basos: 1 %
EOS (ABSOLUTE): 0.3 10*3/uL (ref 0.0–0.4)
Eos: 3 %
Hematocrit: 39.7 % (ref 34.0–46.6)
Hemoglobin: 12.1 g/dL (ref 11.1–15.9)
Immature Grans (Abs): 0 10*3/uL (ref 0.0–0.1)
Immature Granulocytes: 1 %
Lymphocytes Absolute: 1.4 10*3/uL (ref 0.7–3.1)
Lymphs: 17 %
MCH: 22.4 pg — ABNORMAL LOW (ref 26.6–33.0)
MCHC: 30.5 g/dL — ABNORMAL LOW (ref 31.5–35.7)
MCV: 74 fL — ABNORMAL LOW (ref 79–97)
Monocytes Absolute: 0.5 10*3/uL (ref 0.1–0.9)
Monocytes: 6 %
Neutrophils Absolute: 6.2 10*3/uL (ref 1.4–7.0)
Neutrophils: 72 %
Platelets: 682 10*3/uL — ABNORMAL HIGH (ref 150–379)
RBC: 5.4 x10E6/uL — ABNORMAL HIGH (ref 3.77–5.28)
RDW: 17.6 % — ABNORMAL HIGH (ref 12.3–15.4)
WBC: 8.6 10*3/uL (ref 3.4–10.8)

## 2015-05-01 LAB — IRON AND TIBC
Iron Saturation: 7 % — CL (ref 15–55)
Iron: 29 ug/dL (ref 27–139)
Total Iron Binding Capacity: 423 ug/dL (ref 250–450)
UIBC: 394 ug/dL — ABNORMAL HIGH (ref 118–369)

## 2015-05-01 LAB — LACTATE DEHYDROGENASE: LDH: 268 IU/L — ABNORMAL HIGH (ref 119–226)

## 2015-05-01 LAB — FERRITIN: Ferritin: 10 ng/mL — ABNORMAL LOW (ref 15–150)

## 2015-05-02 ENCOUNTER — Telehealth: Payer: Self-pay | Admitting: *Deleted

## 2015-05-02 NOTE — Telephone Encounter (Signed)
OK we will address at time of visit. She may need a dose of IV iron

## 2015-05-02 NOTE — Telephone Encounter (Signed)
Pt called and informed "iron level low - can falsely elevate platelet count which is now 682,000" per Dr Beryle Beams. Stated she takes 2 iron pills daily already; which she started since her last office visit. She has an appt on Monday 2/6.

## 2015-05-02 NOTE — Telephone Encounter (Signed)
-----   Message from Annia Belt, MD sent at 05/01/2015  8:16 AM EST ----- Call pt: iron level low - can falsely elevate platelet count which is now 682,000. I want her to start ferrous sulfate 325 mg PO daily - can get OTC

## 2015-05-07 ENCOUNTER — Ambulatory Visit (INDEPENDENT_AMBULATORY_CARE_PROVIDER_SITE_OTHER): Payer: Medicare Other | Admitting: Oncology

## 2015-05-07 ENCOUNTER — Encounter: Payer: Self-pay | Admitting: Oncology

## 2015-05-07 VITALS — BP 121/67 | HR 68 | Temp 98.1°F | Ht 60.0 in | Wt 105.9 lb

## 2015-05-07 DIAGNOSIS — D509 Iron deficiency anemia, unspecified: Secondary | ICD-10-CM

## 2015-05-07 DIAGNOSIS — Z853 Personal history of malignant neoplasm of breast: Secondary | ICD-10-CM | POA: Diagnosis not present

## 2015-05-07 DIAGNOSIS — K55069 Acute infarction of intestine, part and extent unspecified: Secondary | ICD-10-CM

## 2015-05-07 DIAGNOSIS — D471 Chronic myeloproliferative disease: Secondary | ICD-10-CM

## 2015-05-07 DIAGNOSIS — I81 Portal vein thrombosis: Secondary | ICD-10-CM | POA: Diagnosis not present

## 2015-05-07 DIAGNOSIS — Z79811 Long term (current) use of aromatase inhibitors: Secondary | ICD-10-CM

## 2015-05-07 DIAGNOSIS — D473 Essential (hemorrhagic) thrombocythemia: Secondary | ICD-10-CM | POA: Diagnosis not present

## 2015-05-07 NOTE — Progress Notes (Signed)
Patient ID: MERRY POND, female   DOB: 1940/11/15, 75 y.o.   MRN: [id number] Hematology and Oncology Follow Up Visit  STACYANN MCCONAUGHY [id number] May 06, 1940 75 y.o. 05/07/2015 1:41 PM   Principle Diagnosis: Encounter Diagnoses  Name Primary?  Marland Kitchen Anemia, iron deficiency Yes  . History of breast cancer in female   . 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 She has been on full dose Coumadin anticoagulation and low-dose aspirin since that time. Please see previous office progress notes for additional details. She was subsequently diagnosed with a stage I, ER/PR positive, HER-2-negative, cancer of the left breast in August 2010. Oncotype DX score high risk. She underwent left lumpectomy on 12/26/2008 followed by adjuvant chemotherapy with 4 cycles of Taxotere plus Cytoxan between 02/16/2009 and 04/20/2009. She then had a course of radiation between February 14 and 07/02/2009. She was started on hormonal therapy with Arimidex which she continues at this time. BRCA1 and BRCA2 gene testing was negative. She has one son. One daughter age 63(2016). She developed a large chest wall hematoma at time of the surgery related to tight anticoagulation which has long since resolved.   Interim History: She has had no interim medical problems. She had a mammogram in October which was unremarkable for any new pathology. She had a follow-up visit with her gynecologist in November. Routine exam normal for age. She has not noticed any new breast lumps. She denied any headaches, focal weakness, or change in vision. No change in bowel habit. No hematochezia. Stools dark from iron. 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 Her son  is still out in [facility] and doing well.  Medications: reviewed  Allergies:  Allergies  Allergen Reactions  . Penicillins   . Sulfa Antibiotics     Unable to recall    Review of Systems: See HPI Remaining ROS negative:   Physical Exam: Blood pressure 121/67, pulse 68, temperature 98.1 F (36.7 C), temperature source Oral, height 5' (1.524 m), weight 105 lb 14.4 oz (48.036 kg), SpO2 100 %. Wt Readings from Last 3 Encounters:  05/07/15 105 lb 14.4 oz (48.036 kg)  02/28/15 103 lb (46.72 kg)  12/19/14 101 lb (45.813 kg)     General appearance: well nourished Caucasian woman [name]: Pharynx no erythema, exudate, mass, or ulcer. No thyromegaly or thyroid nodules Lymph nodes: No cervical, supraclavicular, or right axillary lymphadenopathy. Small, approximate 1 cm, lymph node palpable left axilla variably felt on prior exam Breasts: right breast: chronic nipple inversion. Left breast: surgical deformity; no dominant mass Lungs: Clear to auscultation, resonant to percussion throughout Heart: Regular rhythm, no murmur, no gallop, no rub, no click, no edema Abdomen: Soft, nontender, midline surgical scar, normal bowel sounds, no mass, no organomegaly Extremities: No edema, no calf tenderness Musculoskeletal: no joint deformities GU:  Vascular: Carotid pulses 2+, no bruits, distal pulses: Dorsalis pedis 1+ symmetric Neurologic: Alert, oriented, PERRLA, optic discs sharp and vessels normal, no hemorrhage or exudate, cranial nerves grossly normal, motor strength 5 over 5, reflexes 1+ symmetric, upper body coordination normal, gait normal, Skin: No rash or ecchymosis  Lab Results: CBC W/Diff    Component Value Date/Time   WBC 8.6 04/30/2015 0917   WBC 10.6* 12/08/2014 1540   WBC 7.1 06/06/2014 1002   RBC 5.40* 04/30/2015 3734  RBC 5.11 12/08/2014 1540   RBC 5.81* 06/06/2014 1002   HGB 11.8 12/08/2014 1540   HGB 13.9 06/06/2014 1002   HCT 39.7 04/30/2015 0917   HCT 36.8  12/08/2014 1540   HCT 44.8 06/06/2014 1002   PLT 682* 04/30/2015 0917   PLT 633* 12/08/2014 1540   PLT 546* 06/06/2014 1002   MCV 74* 04/30/2015 0917   MCV 72.0* 12/08/2014 1540   MCV 77.1* 06/06/2014 1002   MCH 22.4* 04/30/2015 0917   MCH 23.1* 12/08/2014 1540   MCH 23.9* 06/06/2014 1002   MCHC 30.5* 04/30/2015 0917   MCHC 32.1 12/08/2014 1540   MCHC 31.0 06/06/2014 1002   RDW 17.6* 04/30/2015 0917   RDW 17.1* 12/08/2014 1540   RDW 15.9* 06/06/2014 1002   LYMPHSABS 1.4 04/30/2015 0917   LYMPHSABS 1.4 12/08/2014 1540   LYMPHSABS 1.3 06/06/2014 1002   MONOABS 0.6 12/08/2014 1540   MONOABS 0.4 06/06/2014 1002   EOSABS 0.3 04/30/2015 0917   EOSABS 0.3 12/08/2014 1540   EOSABS 0.2 06/06/2014 1002   BASOSABS 0.1 04/30/2015 0917   BASOSABS 0.1 12/08/2014 1540   BASOSABS 0.1 06/06/2014 1002     Chemistry      Component Value Date/Time   NA 140 04/30/2015 0917   NA 137 12/08/2014 1540   NA 137 06/06/2014 1002   K 4.0 04/30/2015 0917   K 4.2 12/08/2014 1540   CL 100 04/30/2015 0917   CL 105 08/03/2012 1529   CO2 23 04/30/2015 0917   CO2 25 12/08/2014 1540   BUN 21 04/30/2015 0917   BUN 27.4* 12/08/2014 1540   BUN 34* 06/06/2014 1002   CREATININE 1.05* 04/30/2015 0917   CREATININE 1.1 12/08/2014 1540   CREATININE 1.18* 06/06/2014 1002      Component Value Date/Time   CALCIUM 9.5 04/30/2015 0917   CALCIUM 9.9 12/08/2014 1540   ALKPHOS 41 04/30/2015 0917   ALKPHOS 42 12/08/2014 1540   AST 21 04/30/2015 0917   AST 23 12/08/2014 1540   ALT 18 04/30/2015 0917   ALT 21 12/08/2014 1540   BILITOT 0.4 04/30/2015 0917   BILITOT 0.65 12/08/2014 1540   BILITOT 0.5 06/06/2014 1002    Ferritin 10 04/30/2015   Radiological Studies: No results found.  Impression:  #1. Myeloproliferative disorder: Essential thrombocythemia Platelet count continues trending up. She has not required a platelet lowering drug up to this point. I will carry. If platelet count continues to rise,  then I will start her on Hydrea. Given the slowly progressive and now microcytic anemia I suspect that one component of her elevated platelet count relates to recurrent iron deficiency anemia. Ferritin level has not come up on oral iron which she has taken since her last visit with me in September. I suspect that she malabsorbs iron. I proposed that we give her IV iron 510 mg weekly 2 to assess the effects of iron replacement on her platelet count before we would consider platelet lowering drugs. We discussed that it is not the absolute platelet number that determines whether or not somebody should be on a platelet lowering drug. The main complication of essential thrombocythemia is thrombosis and she is more than adequately protected on both aspirin and Coumadin.  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 No subsequent thrombotic events on full dose Coumadin and low-dose aspirin. These will be continued indefinitely.  #3. Stage I, ER positive, HER-2 negative, Oncotype DX high risk cancer of the left breast treated with chemotherapy and now on  hormonal therapy with Arimidex. She has now been on the drug for 5 years. We discussed that overall for a good risk patient our professional society recommends stopping treatment at 5 years until there are now comparative data available looking at 5 versus 10 years and it does seem to be a slight progression free survival advantage to extending therapy.   She has a small, approximate 1 cm, lymph node, palpable in her left axilla on today's exam. This was not demonstrated on most recent mammography done 01/16/2015. I would like her to get a follow-up appointment with her surgeon to see if he can confirm my exam. Further evaluation if necessary, based on his exam.  #4. Essential hypertension  #5. Recurrent iron deficiency anemia. See above #1. No improvement in hemoglobin on oral iron, platelet count higher, ferritin remains low. I will give her a trial  of IV iron at this time.   CC: Patient Care Team: Gaynelle Arabian, MD as PCP - General (Family Medicine) Annia Belt, MD as Consulting Physician (Oncology) Rolm Bookbinder, MD as Consulting Physician (General Surgery)   Annia Belt, MD 2/6/20171:41 PM

## 2015-05-07 NOTE — Patient Instructions (Signed)
1. Schedule intravenous iron with [facility] short stay unit weekly x 2 per patient convenience 2. Schedule follow up appointment with Dr Donne Hazel, Surgery, established patient, 1st available: referral put in computer 3. CBC every 3 months next 05/27/14 4. Ferritin 3/27 5. Continue monthly PT/INR next 2/27 6. MD follow up visit in 4-5 months

## 2015-05-10 ENCOUNTER — Ambulatory Visit (HOSPITAL_COMMUNITY)
Admission: RE | Admit: 2015-05-10 | Discharge: 2015-05-10 | Disposition: A | Discharge status: Home / Self Care | Payer: Medicare Other | Source: Ambulatory Visit | Attending: Oncology | Admitting: Oncology

## 2015-05-10 VITALS — BP 102/68 | HR 66 | Temp 97.9°F | Resp 20 | Ht 60.0 in | Wt 101.4 lb

## 2015-05-10 DIAGNOSIS — D509 Iron deficiency anemia, unspecified: Secondary | ICD-10-CM | POA: Diagnosis not present

## 2015-05-10 MED ORDER — SODIUM CHLORIDE 0.9 % IV SOLN
510.0000 mg | INTRAVENOUS | Status: DC
  Administered 2015-05-10: 510 mg via INTRAVENOUS
  Filled 2015-05-10: qty 17

## 2015-05-15 DIAGNOSIS — N63 Unspecified lump in breast: Secondary | ICD-10-CM | POA: Diagnosis not present

## 2015-05-17 ENCOUNTER — Ambulatory Visit (HOSPITAL_COMMUNITY)
Admission: RE | Admit: 2015-05-17 | Discharge: 2015-05-17 | Disposition: A | Discharge status: Home / Self Care | Payer: Medicare Other | Source: Ambulatory Visit | Attending: Oncology | Admitting: Oncology

## 2015-05-17 VITALS — BP 104/69 | HR 63 | Resp 20 | Ht 60.0 in | Wt 102.0 lb

## 2015-05-17 DIAGNOSIS — D509 Iron deficiency anemia, unspecified: Secondary | ICD-10-CM | POA: Insufficient documentation

## 2015-05-17 MED ORDER — SODIUM CHLORIDE 0.9 % IV SOLN
510.0000 mg | INTRAVENOUS | Status: DC
  Administered 2015-05-17: 510 mg via INTRAVENOUS
  Filled 2015-05-17: qty 17

## 2015-05-21 DIAGNOSIS — E559 Vitamin D deficiency, unspecified: Secondary | ICD-10-CM | POA: Diagnosis not present

## 2015-05-21 DIAGNOSIS — K219 Gastro-esophageal reflux disease without esophagitis: Secondary | ICD-10-CM | POA: Diagnosis not present

## 2015-05-21 DIAGNOSIS — Z853 Personal history of malignant neoplasm of breast: Secondary | ICD-10-CM | POA: Diagnosis not present

## 2015-05-21 DIAGNOSIS — C946 Myelodysplastic disease, not classified: Secondary | ICD-10-CM | POA: Diagnosis not present

## 2015-05-21 DIAGNOSIS — I129 Hypertensive chronic kidney disease with stage 1 through stage 4 chronic kidney disease, or unspecified chronic kidney disease: Secondary | ICD-10-CM | POA: Diagnosis not present

## 2015-05-21 DIAGNOSIS — E7800 Pure hypercholesterolemia, unspecified: Secondary | ICD-10-CM | POA: Diagnosis not present

## 2015-05-21 DIAGNOSIS — M810 Age-related osteoporosis without current pathological fracture: Secondary | ICD-10-CM | POA: Diagnosis not present

## 2015-05-21 DIAGNOSIS — N183 Chronic kidney disease, stage 3 (moderate): Secondary | ICD-10-CM | POA: Diagnosis not present

## 2015-05-28 ENCOUNTER — Ambulatory Visit (INDEPENDENT_AMBULATORY_CARE_PROVIDER_SITE_OTHER): Payer: Medicare Other | Admitting: Pharmacist

## 2015-05-28 ENCOUNTER — Other Ambulatory Visit (INDEPENDENT_AMBULATORY_CARE_PROVIDER_SITE_OTHER): Payer: Medicare Other

## 2015-05-28 DIAGNOSIS — Z7901 Long term (current) use of anticoagulants: Secondary | ICD-10-CM

## 2015-05-28 DIAGNOSIS — D471 Chronic myeloproliferative disease: Secondary | ICD-10-CM

## 2015-05-28 DIAGNOSIS — K55069 Acute infarction of intestine, part and extent unspecified: Secondary | ICD-10-CM

## 2015-05-28 DIAGNOSIS — D509 Iron deficiency anemia, unspecified: Secondary | ICD-10-CM | POA: Diagnosis not present

## 2015-05-28 LAB — POCT INR: INR: 1.3

## 2015-05-28 NOTE — Progress Notes (Signed)
Reviewed Thanks DrG 

## 2015-05-28 NOTE — Patient Instructions (Signed)
Patient instructed to take medications as defined in the Anti-coagulation Track section of this encounter.  Patient instructed to take today's dose.  Patient verbalized understanding of these instructions.    

## 2015-05-28 NOTE — Progress Notes (Signed)
Anti-Coagulation Progress Note  Gwendolyn Williams is a 75 y.o. female who is currently on an anti-coagulation regimen.    RECENT RESULTS: Recent results are below, the most recent result is correlated with a dose of 37.5 mg. per week: Lab Results  Component Value Date   INR 1.30 05/28/2015   INR 3.0 04/30/2015   INR 2.80 03/29/2015   PROTIME 24.0* 12/08/2014    ANTI-COAG DOSE: Anticoagulation Dose Instructions as of 05/28/2015      Dorene Grebe Tue Wed Thu Fri Sat   New Dose 5 mg 7.5 mg 5 mg 7.5 mg 5 mg 5 mg 5 mg       ANTICOAG SUMMARY: Anticoagulation Episode Summary    Current INR goal 2.0-3.0  Next INR check 06/18/2015  INR from last check 1.30! (05/28/2015)  Weekly max dose   Target end date   INR check location   Preferred lab   Send INR reminders to IMP FRONT DESK POOL   Indications  Mesenteric thrombosis [facility]) [K55.069]        Comments       Anticoagulation Care Providers    Provider Role Specialty Phone number   Annia Belt, MD Referring Oncology [phone]      ANTICOAG TODAY: Anticoagulation Summary as of 05/28/2015    INR goal 2.0-3.0  Selected INR 1.30! (05/28/2015)  Next INR check 06/18/2015  Target end date    Indications  Mesenteric thrombosis ([place]) [K55.069]      Anticoagulation Episode Summary    INR check location    Preferred lab    Send INR reminders to [place]   Comments     Anticoagulation Care Providers    Provider Role Specialty Phone number   Annia Belt, MD Referring Oncology [phone]      PATIENT INSTRUCTIONS: Patient Instructions  Patient instructed to take medications as defined in the Anti-coagulation Track section of this encounter.  Patient instructed to take today's dose.  Patient verbalized understanding of these instructions.      FOLLOW-UP Return in 3 weeks (on 06/18/2015) for Follow up INR at 0930h.  [name], III Pharm.D., CACP

## 2015-05-29 ENCOUNTER — Telehealth: Payer: Self-pay | Admitting: *Deleted

## 2015-05-29 DIAGNOSIS — C50919 Malignant neoplasm of unspecified site of unspecified female breast: Secondary | ICD-10-CM | POA: Diagnosis not present

## 2015-05-29 LAB — CBC WITH DIFFERENTIAL/PLATELET
Basophils Absolute: 0.1 10*3/uL (ref 0.0–0.2)
Basos: 1 %
EOS (ABSOLUTE): 0.2 10*3/uL (ref 0.0–0.4)
Eos: 2 %
Hematocrit: 43.1 % (ref 34.0–46.6)
Hemoglobin: 13.3 g/dL (ref 11.1–15.9)
Immature Grans (Abs): 0.1 10*3/uL (ref 0.0–0.1)
Immature Granulocytes: 1 %
Lymphocytes Absolute: 1.4 10*3/uL (ref 0.7–3.1)
Lymphs: 15 %
MCH: 23.1 pg — ABNORMAL LOW (ref 26.6–33.0)
MCHC: 30.9 g/dL — ABNORMAL LOW (ref 31.5–35.7)
MCV: 75 fL — ABNORMAL LOW (ref 79–97)
Monocytes Absolute: 0.5 10*3/uL (ref 0.1–0.9)
Monocytes: 5 %
Neutrophils Absolute: 7.3 10*3/uL — ABNORMAL HIGH (ref 1.4–7.0)
Neutrophils: 76 %
Platelets: 559 10*3/uL — ABNORMAL HIGH (ref 150–379)
RBC: 5.75 x10E6/uL — ABNORMAL HIGH (ref 3.77–5.28)
RDW: 20.7 % — ABNORMAL HIGH (ref 12.3–15.4)
WBC: 9.5 10*3/uL (ref 3.4–10.8)

## 2015-05-29 LAB — FERRITIN: Ferritin: 763 ng/mL — ABNORMAL HIGH (ref 15–150)

## 2015-05-29 NOTE — Telephone Encounter (Signed)
Pt called; no answer - left message " platelet  count down to baseline after IV iron; was 682,00 now 559,00" per Dr Beryle Beams. And to call if she any questions.

## 2015-05-29 NOTE — Telephone Encounter (Signed)
-----   Message from Annia Belt, MD sent at 05/29/2015  8:11 AM EST ----- Call pt: platelet count has come down to baseline after IV iron: was 682,000 now 559,000

## 2015-06-11 ENCOUNTER — Ambulatory Visit (INDEPENDENT_AMBULATORY_CARE_PROVIDER_SITE_OTHER): Payer: Medicare Other | Admitting: Pharmacist

## 2015-06-11 DIAGNOSIS — Z7901 Long term (current) use of anticoagulants: Secondary | ICD-10-CM | POA: Diagnosis not present

## 2015-06-11 DIAGNOSIS — K55069 Acute infarction of intestine, part and extent unspecified: Secondary | ICD-10-CM

## 2015-06-11 LAB — POCT INR: INR: 2.6

## 2015-06-11 NOTE — Patient Instructions (Signed)
Patient instructed to take medications as defined in the Anti-coagulation Track section of this encounter.  Patient instructed to take today's dose.  Patient verbalized understanding of these instructions.    

## 2015-06-11 NOTE — Progress Notes (Signed)
Anti-Coagulation Progress Note  Gwendolyn Williams is a 75 y.o. female who is currently on an anti-coagulation regimen.    RECENT RESULTS: Recent results are below, the most recent result is correlated with a dose of 40 mg. per week: Lab Results  Component Value Date   INR 2.60 06/11/2015   INR 1.30 05/28/2015   INR 3.0 04/30/2015   PROTIME 24.0* 12/08/2014    ANTI-COAG DOSE: Anticoagulation Dose Instructions as of 06/11/2015      Dorene Grebe Tue Wed Thu Fri Sat   New Dose 5 mg 7.5 mg 5 mg 5 mg 7.5 mg 5 mg 5 mg       ANTICOAG SUMMARY: Anticoagulation Episode Summary    Current INR goal 2.0-3.0  Next INR check 07/09/2015  INR from last check 2.60 (06/11/2015)  Weekly max dose   Target end date   INR check location   Preferred lab   Send INR reminders to IMP FRONT DESK POOL   Indications  Mesenteric thrombosis [facility]) [K55.069]        Comments       Anticoagulation Care Providers    Provider Role Specialty Phone number   Annia Belt, MD Referring Oncology [phone]      ANTICOAG TODAY: Anticoagulation Summary as of 06/11/2015    INR goal 2.0-3.0  Selected INR 2.60 (06/11/2015)  Next INR check 07/09/2015  Target end date    Indications  Mesenteric thrombosis ([place]) [K55.069]      Anticoagulation Episode Summary    INR check location    Preferred lab    Send INR reminders to [place]   Comments     Anticoagulation Care Providers    Provider Role Specialty Phone number   Annia Belt, MD Referring Oncology [phone]      PATIENT INSTRUCTIONS: Patient Instructions  Patient instructed to take medications as defined in the Anti-coagulation Track section of this encounter.  Patient instructed to take today's dose.  Patient verbalized understanding of these instructions.       FOLLOW-UP Return in 4 weeks (on 07/09/2015) for Follow up INR at at 0930h.  [name], III Pharm.D., CACP

## 2015-06-11 NOTE — Progress Notes (Signed)
Reviewed Thanks DrG 

## 2015-06-28 DIAGNOSIS — L609 Nail disorder, unspecified: Secondary | ICD-10-CM | POA: Diagnosis not present

## 2015-06-28 DIAGNOSIS — Z86018 Personal history of other benign neoplasm: Secondary | ICD-10-CM | POA: Diagnosis not present

## 2015-06-28 DIAGNOSIS — L570 Actinic keratosis: Secondary | ICD-10-CM | POA: Diagnosis not present

## 2015-06-28 DIAGNOSIS — Z85828 Personal history of other malignant neoplasm of skin: Secondary | ICD-10-CM | POA: Diagnosis not present

## 2015-06-28 DIAGNOSIS — L821 Other seborrheic keratosis: Secondary | ICD-10-CM | POA: Diagnosis not present

## 2015-06-28 DIAGNOSIS — Z23 Encounter for immunization: Secondary | ICD-10-CM | POA: Diagnosis not present

## 2015-06-28 DIAGNOSIS — L720 Epidermal cyst: Secondary | ICD-10-CM | POA: Diagnosis not present

## 2015-06-28 DIAGNOSIS — D225 Melanocytic nevi of trunk: Secondary | ICD-10-CM | POA: Diagnosis not present

## 2015-07-09 ENCOUNTER — Ambulatory Visit (INDEPENDENT_AMBULATORY_CARE_PROVIDER_SITE_OTHER): Payer: Medicare Other | Admitting: Pharmacist

## 2015-07-09 DIAGNOSIS — K55069 Acute infarction of intestine, part and extent unspecified: Secondary | ICD-10-CM

## 2015-07-09 DIAGNOSIS — Z7901 Long term (current) use of anticoagulants: Secondary | ICD-10-CM

## 2015-07-09 LAB — POCT INR: INR: 2.4

## 2015-07-09 NOTE — Progress Notes (Signed)
Anti-Coagulation Progress Note  Gwendolyn Williams is a 75 y.o. female who is currently on an anti-coagulation regimen.    RECENT RESULTS: Recent results are below, the most recent result is correlated with a dose of 40 mg. per week: Lab Results  Component Value Date   INR 2.40 07/09/2015   INR 2.60 06/11/2015   INR 1.30 05/28/2015   PROTIME 24.0* 12/08/2014    ANTI-COAG DOSE: Anticoagulation Dose Instructions as of 07/09/2015      Dorene Grebe Tue Wed Thu Fri Sat   New Dose 5 mg 7.5 mg 5 mg 5 mg 7.5 mg 5 mg 5 mg       ANTICOAG SUMMARY: Anticoagulation Episode Summary    Current INR goal 2.0-3.0  Next INR check 08/06/2015  INR from last check 2.40 (07/09/2015)  Weekly max dose   Target end date   INR check location   Preferred lab   Send INR reminders to IMP FRONT DESK POOL   Indications  Mesenteric thrombosis [facility]) [K55.069]        Comments       Anticoagulation Care Providers    Provider Role Specialty Phone number   Annia Belt, MD Referring Oncology [phone]      ANTICOAG TODAY: Anticoagulation Summary as of 07/09/2015    INR goal 2.0-3.0  Selected INR 2.40 (07/09/2015)  Next INR check 08/06/2015  Target end date    Indications  Mesenteric thrombosis ([place]) [K55.069]      Anticoagulation Episode Summary    INR check location    Preferred lab    Send INR reminders to [place]   Comments     Anticoagulation Care Providers    Provider Role Specialty Phone number   Annia Belt, MD Referring Oncology [phone]      PATIENT INSTRUCTIONS: Patient Instructions  Patient instructed to take medications as defined in the Anti-coagulation Track section of this encounter.  Patient instructed to take today's dose.  Patient verbalized understanding of these instructions.       FOLLOW-UP Return in about 4 weeks (around 08/06/2015) for Follow up INR at 0930h.  [name], III Pharm.D., CACP

## 2015-07-09 NOTE — Progress Notes (Signed)
Reviewed thanks DrG 

## 2015-07-09 NOTE — Patient Instructions (Signed)
Patient instructed to take medications as defined in the Anti-coagulation Track section of this encounter.  Patient instructed to take today's dose.  Patient verbalized understanding of these instructions.    

## 2015-08-06 ENCOUNTER — Ambulatory Visit (INDEPENDENT_AMBULATORY_CARE_PROVIDER_SITE_OTHER): Payer: Medicare Other | Admitting: Pharmacist

## 2015-08-06 DIAGNOSIS — Z7901 Long term (current) use of anticoagulants: Secondary | ICD-10-CM

## 2015-08-06 DIAGNOSIS — K55069 Acute infarction of intestine, part and extent unspecified: Secondary | ICD-10-CM | POA: Diagnosis not present

## 2015-08-06 LAB — POCT INR: INR: 3.9

## 2015-08-06 NOTE — Progress Notes (Signed)
Anti-Coagulation Progress Note  Gwendolyn Williams is a 75 y.o. female who is currently on an anti-coagulation regimen.    RECENT RESULTS: Recent results are below, the most recent result is correlated with a dose of 40 mg. per week: Lab Results  Component Value Date   INR 3.90 08/06/2015   INR 2.40 07/09/2015   INR 2.60 06/11/2015   PROTIME 24.0* 12/08/2014    ANTI-COAG DOSE: Anticoagulation Dose Instructions as of 08/06/2015      Dorene Grebe Tue Wed Thu Fri Sat   New Dose 5 mg 7.5 mg 5 mg 5 mg 7.5 mg 5 mg 5 mg       ANTICOAG SUMMARY: Anticoagulation Episode Summary    Current INR goal 2.0-3.0  Next INR check 08/20/2015  INR from last check 3.90! (08/06/2015)  Weekly max dose   Target end date   INR check location   Preferred lab   Send INR reminders to IMP FRONT DESK POOL   Indications  Mesenteric thrombosis [facility]) [K55.069]        Comments       Anticoagulation Care Providers    Provider Role Specialty Phone number   Annia Belt, MD Referring Oncology [phone]      ANTICOAG TODAY: Anticoagulation Summary as of 08/06/2015    INR goal 2.0-3.0  Selected INR 3.90! (08/06/2015)  Next INR check 08/20/2015  Target end date    Indications  Mesenteric thrombosis ([place]) [K55.069]      Anticoagulation Episode Summary    INR check location    Preferred lab    Send INR reminders to [place]   Comments     Anticoagulation Care Providers    Provider Role Specialty Phone number   Annia Belt, MD Referring Oncology [phone]      PATIENT INSTRUCTIONS: Patient Instructions  Patient instructed to take medications as defined in the Anti-coagulation Track section of this encounter.  Patient instructed to take today's dose.  Patient verbalized understanding of these instructions.       FOLLOW-UP Return in 2 weeks (on 08/20/2015) for Follow up INR 1015h.  [name], III Pharm.D., CACP

## 2015-08-06 NOTE — Progress Notes (Signed)
Reviewed Thanks DrG 

## 2015-08-06 NOTE — Patient Instructions (Signed)
Patient instructed to take medications as defined in the Anti-coagulation Track section of this encounter.  Patient instructed to take today's dose.  Patient verbalized understanding of these instructions.    

## 2015-08-17 ENCOUNTER — Ambulatory Visit
Admission: RE | Admit: 2015-08-17 | Discharge: 2015-08-17 | Disposition: A | Discharge status: Home / Self Care | Payer: Medicare Other | Source: Ambulatory Visit | Attending: Physician Assistant | Admitting: Physician Assistant

## 2015-08-17 ENCOUNTER — Other Ambulatory Visit: Payer: Self-pay | Admitting: Physician Assistant

## 2015-08-17 DIAGNOSIS — M25572 Pain in left ankle and joints of left foot: Secondary | ICD-10-CM

## 2015-08-20 ENCOUNTER — Ambulatory Visit (INDEPENDENT_AMBULATORY_CARE_PROVIDER_SITE_OTHER): Payer: Medicare Other | Admitting: Pharmacist

## 2015-08-20 DIAGNOSIS — Z7901 Long term (current) use of anticoagulants: Secondary | ICD-10-CM | POA: Diagnosis not present

## 2015-08-20 DIAGNOSIS — K55069 Acute infarction of intestine, part and extent unspecified: Secondary | ICD-10-CM | POA: Diagnosis not present

## 2015-08-20 LAB — POCT INR: INR: 2

## 2015-08-20 NOTE — Patient Instructions (Signed)
Patient instructed to take medications as defined in the Anti-coagulation Track section of this encounter.  Patient instructed to take today's dose.  Patient verbalized understanding of these instructions.    

## 2015-08-20 NOTE — Progress Notes (Signed)
Anti-Coagulation Progress Note  Gwendolyn Williams is a 75 y.o. female who is currently on an anti-coagulation regimen.    RECENT RESULTS: Recent results are below, the most recent result is correlated with a dose of 40 mg. per week: Lab Results  Component Value Date   INR 2.00 08/20/2015   INR 3.90 08/06/2015   INR 2.40 07/09/2015   PROTIME 24.0* 12/08/2014    ANTI-COAG DOSE: Anticoagulation Dose Instructions as of 08/20/2015      Dorene Grebe Tue Wed Thu Fri Sat   New Dose 5 mg 7.5 mg 5 mg 5 mg 5 mg 5 mg 5 mg       ANTICOAG SUMMARY: Anticoagulation Episode Summary    Current INR goal 2.0-3.0  Next INR check 09/10/2015  INR from last check 2.00 (08/20/2015)  Weekly max dose   Target end date   INR check location   Preferred lab   Send INR reminders to IMP FRONT DESK POOL   Indications  Mesenteric thrombosis [facility]) [K55.069]        Comments       Anticoagulation Care Providers    Provider Role Specialty Phone number   Annia Belt, MD Referring Oncology [phone]      ANTICOAG TODAY: Anticoagulation Summary as of 08/20/2015    INR goal 2.0-3.0  Selected INR 2.00 (08/20/2015)  Next INR check 09/10/2015  Target end date    Indications  Mesenteric thrombosis ([place]) [K55.069]      Anticoagulation Episode Summary    INR check location    Preferred lab    Send INR reminders to [place]   Comments     Anticoagulation Care Providers    Provider Role Specialty Phone number   Annia Belt, MD Referring Oncology [phone]      PATIENT INSTRUCTIONS: Patient Instructions  Patient instructed to take medications as defined in the Anti-coagulation Track section of this encounter.  Patient instructed to take today's dose.  Patient verbalized understanding of these instructions.       FOLLOW-UP Return in 3 weeks (on 09/10/2015) for Follow up INR at 1000h.  [name], III Pharm.D., CACP

## 2015-08-23 NOTE — Progress Notes (Signed)
INTERNAL MEDICINE TEACHING ATTENDING ADDENDUM - Aldine Contes M.D  Duration- indefinite, Indication- mesentric thrombosis, INR- therapeutic. Agree with pharmacy recommendations as outlined in their note.

## 2015-09-03 ENCOUNTER — Ambulatory Visit (INDEPENDENT_AMBULATORY_CARE_PROVIDER_SITE_OTHER): Payer: Medicare Other | Admitting: Pharmacist

## 2015-09-03 DIAGNOSIS — Z7901 Long term (current) use of anticoagulants: Secondary | ICD-10-CM

## 2015-09-03 DIAGNOSIS — K55069 Acute infarction of intestine, part and extent unspecified: Secondary | ICD-10-CM | POA: Diagnosis not present

## 2015-09-03 LAB — POCT INR: INR: 2.7

## 2015-09-03 NOTE — Patient Instructions (Signed)
Patient instructed to take medications as defined in the Anti-coagulation Track section of this encounter.  Patient instructed to take today's dose.  Patient verbalized understanding of these instructions.    

## 2015-09-03 NOTE — Progress Notes (Signed)
Anti-Coagulation Progress Note  Gwendolyn Williams is a 75 y.o. female who is currently on an anti-coagulation regimen.    RECENT RESULTS: Recent results are below, the most recent result is correlated with a dose of 37.5 mg. per week: Lab Results  Component Value Date   INR 2.70 09/03/2015   INR 2.00 08/20/2015   INR 3.90 08/06/2015   PROTIME 24.0* 12/08/2014    ANTI-COAG DOSE: Anticoagulation Dose Instructions as of 09/03/2015      Dorene Grebe Tue Wed Thu Fri Sat   New Dose 5 mg 7.5 mg 5 mg 5 mg 5 mg 5 mg 5 mg    Description        Call Dr. Maudie Mercury to see patient at 1000h.       ANTICOAG SUMMARY: Anticoagulation Episode Summary    Current INR goal 2.0-3.0  Next INR check 09/20/2015  INR from last check 2.70 (09/03/2015)  Weekly max dose   Target end date   INR check location   Preferred lab   Send INR reminders to IMP FRONT DESK POOL   Indications  Mesenteric thrombosis [facility]) [K55.069]        Comments       Anticoagulation Care Providers    Provider Role Specialty Phone number   Annia Belt, MD Referring Oncology [phone]      ANTICOAG TODAY: Anticoagulation Summary as of 09/03/2015    INR goal 2.0-3.0  Selected INR 2.70 (09/03/2015)  Next INR check 09/20/2015  Target end date    Indications  Mesenteric thrombosis ([place]) [K55.069]      Anticoagulation Episode Summary    INR check location    Preferred lab    Send INR reminders to [place]   Comments     Anticoagulation Care Providers    Provider Role Specialty Phone number   Annia Belt, MD Referring Oncology [phone]      PATIENT INSTRUCTIONS: Patient Instructions  Patient instructed to take medications as defined in the Anti-coagulation Track section of this encounter.  Patient instructed to take today's dose.  Patient verbalized understanding of these instructions.        FOLLOW-UP Return in about 2 weeks (around 09/20/2015) for Follow up INR at 1000h. [name],  III Pharm.D., CACP

## 2015-09-03 NOTE — Progress Notes (Signed)
INTERNAL MEDICINE TEACHING ATTENDING ADDENDUM - Aldine Contes M.D  Duration- indefinite, Indication- mesentric thrombosis, INR- therapeutic. Agree with pharmacy recommendations as outlined in their note.

## 2015-09-10 ENCOUNTER — Ambulatory Visit: Payer: Medicare Other

## 2015-09-20 ENCOUNTER — Ambulatory Visit (INDEPENDENT_AMBULATORY_CARE_PROVIDER_SITE_OTHER): Payer: Medicare Other | Admitting: Pharmacist

## 2015-09-20 ENCOUNTER — Other Ambulatory Visit (INDEPENDENT_AMBULATORY_CARE_PROVIDER_SITE_OTHER): Payer: Medicare Other

## 2015-09-20 DIAGNOSIS — D509 Iron deficiency anemia, unspecified: Secondary | ICD-10-CM | POA: Diagnosis not present

## 2015-09-20 DIAGNOSIS — D471 Chronic myeloproliferative disease: Secondary | ICD-10-CM

## 2015-09-20 DIAGNOSIS — Z7901 Long term (current) use of anticoagulants: Secondary | ICD-10-CM

## 2015-09-20 DIAGNOSIS — C946 Myelodysplastic disease, not classified: Secondary | ICD-10-CM | POA: Diagnosis not present

## 2015-09-20 DIAGNOSIS — K55069 Acute infarction of intestine, part and extent unspecified: Secondary | ICD-10-CM | POA: Diagnosis not present

## 2015-09-20 LAB — POCT INR: INR: 3.3

## 2015-09-20 NOTE — Progress Notes (Signed)
Anti-Coagulation Progress Note  Gwendolyn Williams is a 75 y.o. female who is currently on an anti-coagulation regimen.    RECENT RESULTS: Recent results are below, the most recent result is correlated with a dose of 37.5 mg. per week: Lab Results  Component Value Date   INR 3.3 09/20/2015   INR 2.70 09/03/2015   INR 2.00 08/20/2015   PROTIME 24.0* 12/08/2014    ANTI-COAG DOSE: Anticoagulation Dose Instructions as of 09/20/2015      Dorene Grebe Tue Wed Thu Fri Sat   New Dose 5 mg 5 mg 5 mg 5 mg 5 mg 5 mg 5 mg       ANTICOAG SUMMARY: Anticoagulation Episode Summary    Current INR goal 2.0-3.0  Next INR check 10/22/2015  INR from last check 3.3! (09/20/2015)  Weekly max dose   Target end date   INR check location   Preferred lab   Send INR reminders to IMP FRONT DESK POOL   Indications  Mesenteric thrombosis [facility]) [K55.069]        Comments       Anticoagulation Care Providers    Provider Role Specialty Phone number   Annia Belt, MD Referring Oncology [phone]      ANTICOAG TODAY: Anticoagulation Summary as of 09/20/2015    INR goal 2.0-3.0  Selected INR 3.3! (09/20/2015)  Next INR check 10/22/2015  Target end date    Indications  Mesenteric thrombosis ([place]) [K55.069]      Anticoagulation Episode Summary    INR check location    Preferred lab    Send INR reminders to [place]   Comments     Anticoagulation Care Providers    Provider Role Specialty Phone number   Annia Belt, MD Referring Oncology [phone]      PATIENT INSTRUCTIONS: Patient Instructions  Patient instructed to take medications as defined in the Anti-coagulation Track section of this encounter.  Patient instructed to take today's dose.  Patient verbalized understanding of these instructions.       FOLLOW-UP Return in 5 weeks (on 10/22/2015) for Follow up INR at 0900h. Have Dr. Maudie Mercury see patient. If she is not here--call Dr. Elie Confer on cell phone: .  [name],  III Pharm.D., CACP

## 2015-09-20 NOTE — Progress Notes (Signed)
Reviewed Thanks DrG 

## 2015-09-20 NOTE — Patient Instructions (Signed)
Patient instructed to take medications as defined in the Anti-coagulation Track section of this encounter.  Patient instructed to take today's dose.  Patient verbalized understanding of these instructions.    

## 2015-09-21 LAB — CBC WITH DIFFERENTIAL/PLATELET
Basophils Absolute: 0.1 10*3/uL (ref 0.0–0.2)
Basos: 1 %
EOS (ABSOLUTE): 0.3 10*3/uL (ref 0.0–0.4)
Eos: 3 %
Hematocrit: 46 % (ref 34.0–46.6)
Hemoglobin: 14.4 g/dL (ref 11.1–15.9)
Immature Grans (Abs): 0.1 10*3/uL (ref 0.0–0.1)
Immature Granulocytes: 1 %
Lymphocytes Absolute: 1.4 10*3/uL (ref 0.7–3.1)
Lymphs: 15 %
MCH: 26.2 pg — ABNORMAL LOW (ref 26.6–33.0)
MCHC: 31.3 g/dL — ABNORMAL LOW (ref 31.5–35.7)
MCV: 84 fL (ref 79–97)
Monocytes Absolute: 0.6 10*3/uL (ref 0.1–0.9)
Monocytes: 6 %
Neutrophils Absolute: 7 10*3/uL (ref 1.4–7.0)
Neutrophils: 74 %
Platelets: 513 10*3/uL — ABNORMAL HIGH (ref 150–379)
RBC: 5.5 x10E6/uL — ABNORMAL HIGH (ref 3.77–5.28)
RDW: 16.1 % — ABNORMAL HIGH (ref 12.3–15.4)
WBC: 9.4 10*3/uL (ref 3.4–10.8)

## 2015-09-24 ENCOUNTER — Encounter: Payer: Self-pay | Admitting: Oncology

## 2015-09-24 ENCOUNTER — Ambulatory Visit (INDEPENDENT_AMBULATORY_CARE_PROVIDER_SITE_OTHER): Payer: Medicare Other | Admitting: Oncology

## 2015-09-24 VITALS — BP 106/69 | HR 78 | Temp 97.8°F | Ht 60.0 in | Wt 103.9 lb

## 2015-09-24 DIAGNOSIS — Z853 Personal history of malignant neoplasm of breast: Secondary | ICD-10-CM | POA: Diagnosis not present

## 2015-09-24 DIAGNOSIS — R590 Localized enlarged lymph nodes: Secondary | ICD-10-CM | POA: Diagnosis not present

## 2015-09-24 DIAGNOSIS — D471 Chronic myeloproliferative disease: Secondary | ICD-10-CM

## 2015-09-24 DIAGNOSIS — K55069 Acute infarction of intestine, part and extent unspecified: Secondary | ICD-10-CM

## 2015-09-24 DIAGNOSIS — D473 Essential (hemorrhagic) thrombocythemia: Secondary | ICD-10-CM | POA: Diagnosis not present

## 2015-09-24 DIAGNOSIS — D509 Iron deficiency anemia, unspecified: Secondary | ICD-10-CM

## 2015-09-24 NOTE — Progress Notes (Signed)
Patient ID: Gwendolyn Williams, female   DOB: 1940-09-23, 75 y.o.   MRN: [id number] Hematology and Oncology Follow Up Visit  Gwendolyn Williams [id number] Mar 19, 1941 75 y.o. 09/24/2015 9:56 AM   Principle Diagnosis: Encounter Diagnoses  Name Primary?  . Myeloproliferative disorder ([place]) Yes  . 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 History of breast cancer in female   . 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 She has been on full dose Coumadin anticoagulation and low-dose aspirin since that time. Please see previous office progress notes for additional details. She was subsequently diagnosed with a stage I, ER/PR positive, HER-2-negative, cancer of the left breast in August 2010. Oncotype DX score high risk. She underwent left lumpectomy on 12/26/2008 followed by adjuvant chemotherapy with 4 cycles of Taxotere plus Cytoxan between 02/16/2009 and 04/20/2009. She then had a course of radiation between February 14 and 07/02/2009. She was started on hormonal therapy with Arimidex which she continues at this time. BRCA1 and BRCA2 gene testing was negative.  She developed a large chest wall hematoma at time of the surgery related to tight anticoagulation which has long since resolved. She has one son. One daughter age 40(2016).   Interim History:   At time of last visit, platelet count was trending up. Hemoglobin trending down. Ferritin level as low as 10. She received 2 doses of parenteral iron each 510 mg on February 9 and 05/17/2015. I'm pleased to report her hemoglobin has come up to 14.4 g as of 09/20/2015. Ferritin rose up to 763 by February 27. Platelet count fell back to her baseline with most recent value 513,000 on June 22 compared with peak  value of 682,000 back in January. She had a boost in her energy level following the IV iron. With respect to the small lymph node palpable in the left axilla, she did follow-up with her surgeon. He was not able to feel the node. It did not show up on imaging either. She denies any neurologic signs or symptoms. She does have chronic, intermittent, positional dizziness. No headache, change in vision, dysarthria, focal weakness or numbness. No recurrent abdominal pain. No hematochezia or melena.  Medications: reviewed  Allergies:  Allergies  Allergen Reactions  . Penicillins   . Sulfa Antibiotics     Unable to recall    Review of Systems: See interim history Remaining ROS negative:   Physical Exam: Blood pressure 106/69, pulse 78, temperature 97.8 F (36.6 C), temperature source Oral, height 5' (1.524 m), weight 103 lb 14.4 oz (47.129 kg), SpO2 99 %. Wt Readings from Last 3 Encounters:  09/24/15 103 lb 14.4 oz (47.129 kg)  05/17/15 102 lb (46.267 kg)  05/10/15 101 lb 6 oz (45.983 kg)     General appearance: Thin Caucasian woman [name]: Pharynx no erythema, exudate, mass, or ulcer. No thyromegaly or thyroid nodules Lymph nodes: No cervical, supraclavicular, or right axillary lymphadenopathy. Persistent, subcentimeter node in the left axilla unchanged from previous exam. Breasts: Examined at last visit not repeated today. Lungs: Clear to auscultation, resonant to percussion throughout Heart: Regular rhythm, no murmur, no gallop, no rub, no click, no edema Abdomen: Soft, nontender, normal bowel sounds, no mass, no organomegaly Extremities: No edema, no calf tenderness Musculoskeletal: no joint deformities GU:  Vascular: Carotid pulses 2+, no bruits, distal pulses: Dorsalis pedis 1+  symmetric Neurologic: Alert, oriented, PERRLA, optic discs sharp and vessels normal, no hemorrhage or exudate, cranial nerves grossly normal, motor strength 5 over 5, reflexes 1+ symmetric, upper body  coordination normal, gait normal, Skin: No rash or ecchymosis  Lab Results: CBC W/Diff    Component Value Date/Time   WBC 9.4 09/20/2015 0955   WBC 10.6* 12/08/2014 1540   WBC 7.1 06/06/2014 1002   RBC 5.50* 09/20/2015 0955   RBC 5.11 12/08/2014 1540   RBC 5.81* 06/06/2014 1002   HGB 11.8 12/08/2014 1540   HGB 13.9 06/06/2014 1002   HCT 46.0 09/20/2015 0955   HCT 36.8 12/08/2014 1540   HCT 44.8 06/06/2014 1002   PLT 513* 09/20/2015 0955   PLT 633* 12/08/2014 1540   PLT 546* 06/06/2014 1002   MCV 84 09/20/2015 0955   MCV 72.0* 12/08/2014 1540   MCV 77.1* 06/06/2014 1002   MCH 26.2* 09/20/2015 0955   MCH 23.1* 12/08/2014 1540   MCH 23.9* 06/06/2014 1002   MCHC 31.3* 09/20/2015 0955   MCHC 32.1 12/08/2014 1540   MCHC 31.0 06/06/2014 1002   RDW 16.1* 09/20/2015 0955   RDW 17.1* 12/08/2014 1540   RDW 15.9* 06/06/2014 1002   LYMPHSABS 1.4 09/20/2015 0955   LYMPHSABS 1.4 12/08/2014 1540   LYMPHSABS 1.3 06/06/2014 1002   MONOABS 0.6 12/08/2014 1540   MONOABS 0.4 06/06/2014 1002   EOSABS 0.3 09/20/2015 0955   EOSABS 0.3 12/08/2014 1540   EOSABS 0.2 06/06/2014 1002   BASOSABS 0.1 09/20/2015 0955   BASOSABS 0.1 12/08/2014 1540   BASOSABS 0.1 06/06/2014 1002     Chemistry      Component Value Date/Time   NA 140 04/30/2015 0917   NA 137 12/08/2014 1540   NA 137 06/06/2014 1002   K 4.0 04/30/2015 0917   K 4.2 12/08/2014 1540   CL 100 04/30/2015 0917   CL 105 08/03/2012 1529   CO2 23 04/30/2015 0917   CO2 25 12/08/2014 1540   BUN 21 04/30/2015 0917   BUN 27.4* 12/08/2014 1540   BUN 34* 06/06/2014 1002   CREATININE 1.05* 04/30/2015 0917   CREATININE 1.1 12/08/2014 1540   CREATININE 1.18* 06/06/2014 1002      Component Value Date/Time   CALCIUM 9.5 04/30/2015 0917   CALCIUM 9.9 12/08/2014 1540   ALKPHOS 41 04/30/2015 0917   ALKPHOS 42 12/08/2014 1540   AST 21 04/30/2015 0917   AST 23 12/08/2014 1540   ALT 18 04/30/2015 0917   ALT 21 12/08/2014 1540   BILITOT  0.4 04/30/2015 0917   BILITOT 0.65 12/08/2014 1540   BILITOT 0.5 06/06/2014 1002       Radiological Studies: No results found.  Impression:  #1. Myeloproliferative disorder: Essential thrombocythemia Platelet count back to her baseline after repleting iron. See discussion above. Continue periodic lab monitoring now on an every 3 month scheduled.  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 No subsequent thrombotic events on full dose Coumadin and low-dose aspirin. These will be continued indefinitely.  #3. Stage I, ER positive, HER-2 negative, Oncotype DX high risk cancer of the left breast treated with chemotherapy and now on hormonal therapy with Arimidex. Based on new data showing a slight improvement in disease free survival, we discussed extending her Arimidex for a total of 10 years at time of her last visit. She elected to stop at the five-year point which is fine. With respect to the small lymph node palpable in her left axilla, this is likely benign. It  remains small. Unchanged from my February 2017 exam. Not pathologic-appearing on imaging. Will continue to follow clinically.  #4. Essential hypertension  #5. Recurrent iron deficiency anemia. Complete response to parenteral iron.   CC: Patient Care Team: Gaynelle Arabian, MD as PCP - General (Family Medicine) Annia Belt, MD as Consulting Physician (Oncology) Rolm Bookbinder, MD as Consulting Physician (General Surgery)   Annia Belt, MD 6/26/20179:56 AM

## 2015-09-24 NOTE — Patient Instructions (Signed)
Continue every 3 month CBC,diff PT/INR per coumadin clinic MD visit 6 months: chem profile & ferritin in addition to CBC 1-2 weeks before visit

## 2015-10-19 ENCOUNTER — Telehealth: Payer: Self-pay | Admitting: Pharmacist

## 2015-10-19 NOTE — Telephone Encounter (Signed)
Left VM--Dr. Maudie Mercury and myself both out of hospital/on PAL on Monday 24-JUL-17. Advised patient she may keep appointment for that date and Dr. Beryle Beams will adjust warfarin PRN--or, she may elect to come on Monday 31-JUL-17 to see me.

## 2015-10-22 ENCOUNTER — Encounter: Payer: Self-pay | Admitting: Oncology

## 2015-10-22 ENCOUNTER — Other Ambulatory Visit (INDEPENDENT_AMBULATORY_CARE_PROVIDER_SITE_OTHER): Payer: Medicare Other

## 2015-10-22 ENCOUNTER — Other Ambulatory Visit: Payer: Self-pay | Admitting: Oncology

## 2015-10-22 ENCOUNTER — Ambulatory Visit: Payer: Medicare Other

## 2015-10-22 DIAGNOSIS — K55069 Acute infarction of intestine, part and extent unspecified: Secondary | ICD-10-CM | POA: Diagnosis present

## 2015-10-22 LAB — POCT INR: INR: 1.4

## 2015-10-22 MED ORDER — RIVAROXABAN 20 MG PO TABS: 20.0000 mg | ORAL_TABLET | Freq: Every day | ORAL | Status: DC

## 2015-10-22 NOTE — Progress Notes (Signed)
Patient follow-up for chronic warfarin anticoagulation status post mesenteric vascular thrombosis with subsequent diagnosis of myeloproliferative disorder/essential thrombocythemia. She came in for routine PT/INR and was grossly subtherapeutic at 1.4. No major changes in diet. She is using a lot of dill on her cottage cheese. I doubt that this has influenced her anticoagulation. She is going for a 10 day trip to [place] and leaving this Wednesday. I think the easiest way to assure rapid anticoagulation is to put her on a NOAC. We discussed the available drugs. Their safety and efficacy. The fact that they don't require routine lab monitoring and do not interfere with diet. They do not interact with any of the medications she is currently on. They have a rapid onset of action. She has normal renal function. There are no contraindications. We discussed the 4 available drugs. We discussed the fact that there is no antidote to the Xa inhibitors but this has not been a significant problem worldwide in thousands of patients treated over the last 7 years. We anticipate that there will be a antidote available this year. We discussed once daily dosing versus twice-daily dosing. Husband involved in the discussion. He is a retired Software engineer. Impression: Subtherapeutic warfarin anticoagulation in a lady on chronic anticoagulation Plan: Begin Xarelto 20 mg daily without a loading dose. Discontinue warfarin. Printed information about the drug given to her. Pharmacy discount card provided. Seven-day sample dispensed. Short-term prescription for 30 days written. Long-term 90 day prescription for her chronic medication needs written.

## 2015-10-29 ENCOUNTER — Ambulatory Visit: Payer: Medicare Other

## 2015-11-05 ENCOUNTER — Ambulatory Visit: Payer: Medicare Other | Admitting: Pharmacist

## 2015-11-05 DIAGNOSIS — K55069 Acute infarction of intestine, part and extent unspecified: Secondary | ICD-10-CM

## 2015-11-19 DIAGNOSIS — M810 Age-related osteoporosis without current pathological fracture: Secondary | ICD-10-CM | POA: Diagnosis not present

## 2015-11-19 DIAGNOSIS — Z853 Personal history of malignant neoplasm of breast: Secondary | ICD-10-CM | POA: Diagnosis not present

## 2015-11-19 DIAGNOSIS — I129 Hypertensive chronic kidney disease with stage 1 through stage 4 chronic kidney disease, or unspecified chronic kidney disease: Secondary | ICD-10-CM | POA: Diagnosis not present

## 2015-11-19 DIAGNOSIS — E7800 Pure hypercholesterolemia, unspecified: Secondary | ICD-10-CM | POA: Diagnosis not present

## 2015-11-19 DIAGNOSIS — N183 Chronic kidney disease, stage 3 (moderate): Secondary | ICD-10-CM | POA: Diagnosis not present

## 2015-11-19 DIAGNOSIS — K219 Gastro-esophageal reflux disease without esophagitis: Secondary | ICD-10-CM | POA: Diagnosis not present

## 2015-11-19 DIAGNOSIS — C946 Myelodysplastic disease, not classified: Secondary | ICD-10-CM | POA: Diagnosis not present

## 2015-11-19 DIAGNOSIS — E559 Vitamin D deficiency, unspecified: Secondary | ICD-10-CM | POA: Diagnosis not present

## 2015-12-27 DIAGNOSIS — M25552 Pain in left hip: Secondary | ICD-10-CM | POA: Diagnosis not present

## 2015-12-27 DIAGNOSIS — Z23 Encounter for immunization: Secondary | ICD-10-CM | POA: Diagnosis not present

## 2016-01-04 DIAGNOSIS — M25552 Pain in left hip: Secondary | ICD-10-CM | POA: Diagnosis not present

## 2016-01-08 DIAGNOSIS — M25552 Pain in left hip: Secondary | ICD-10-CM | POA: Diagnosis not present

## 2016-01-11 DIAGNOSIS — M7062 Trochanteric bursitis, left hip: Secondary | ICD-10-CM | POA: Diagnosis not present

## 2016-01-11 DIAGNOSIS — M25552 Pain in left hip: Secondary | ICD-10-CM | POA: Diagnosis not present

## 2016-01-22 DIAGNOSIS — R928 Other abnormal and inconclusive findings on diagnostic imaging of breast: Secondary | ICD-10-CM | POA: Diagnosis not present

## 2016-01-22 DIAGNOSIS — M810 Age-related osteoporosis without current pathological fracture: Secondary | ICD-10-CM | POA: Diagnosis not present

## 2016-01-22 DIAGNOSIS — Z853 Personal history of malignant neoplasm of breast: Secondary | ICD-10-CM | POA: Diagnosis not present

## 2016-01-25 ENCOUNTER — Encounter: Payer: Self-pay | Admitting: Gynecology

## 2016-02-27 DIAGNOSIS — M25552 Pain in left hip: Secondary | ICD-10-CM | POA: Diagnosis not present

## 2016-02-27 DIAGNOSIS — M1612 Unilateral primary osteoarthritis, left hip: Secondary | ICD-10-CM | POA: Diagnosis not present

## 2016-02-27 DIAGNOSIS — M7062 Trochanteric bursitis, left hip: Secondary | ICD-10-CM | POA: Diagnosis not present

## 2016-02-29 ENCOUNTER — Encounter: Payer: Self-pay | Admitting: Gynecology

## 2016-02-29 ENCOUNTER — Ambulatory Visit (INDEPENDENT_AMBULATORY_CARE_PROVIDER_SITE_OTHER): Payer: Medicare Other | Admitting: Gynecology

## 2016-02-29 VITALS — BP 110/66 | Ht 60.0 in | Wt 103.0 lb

## 2016-02-29 DIAGNOSIS — N952 Postmenopausal atrophic vaginitis: Secondary | ICD-10-CM

## 2016-02-29 DIAGNOSIS — Z01411 Encounter for gynecological examination (general) (routine) with abnormal findings: Secondary | ICD-10-CM

## 2016-02-29 DIAGNOSIS — C50912 Malignant neoplasm of unspecified site of left female breast: Secondary | ICD-10-CM

## 2016-02-29 DIAGNOSIS — M810 Age-related osteoporosis without current pathological fracture: Secondary | ICD-10-CM

## 2016-02-29 NOTE — Progress Notes (Signed)
    Gwendolyn Williams Apr 10, 1940 [id number]        75 y.o.  G2P0002  for breast and pelvic exam.  Past medical history,surgical history, problem list, medications, allergies, family history and social history were all reviewed and documented as reviewed in the EPIC chart.  ROS:  Performed with pertinent positives and negatives included in the history, assessment and plan.   Additional significant findings :  None   Exam: [name] assistant Vitals:   02/29/16 1052  BP: 110/66  Weight: 103 lb (46.7 kg)  Height: 5' (1.524 m)   Body mass index is 20.12 kg/m.  General appearance:  Normal affect, orientation and appearance. Skin: Grossly normal HEENT: Without gross lesions.  No cervical or supraclavicular adenopathy. Thyroid normal.  Lungs:  Clear without wheezing, rales or rhonchi Cardiac: RR, without RMG Abdominal:  Soft, nontender, without masses, guarding, rebound, organomegaly or hernia Breasts:  Examined lying and sitting. Right without masses, retractions, discharge or axillary adenopathy. Left with old lumpectomy scarring otherwise no masses, discharge or adenopathy Pelvic:  Ext, BUS, Vagina with atrophic changes  Cervix with atrophic changes  Uterus anteverted, normal size, shape and contour, midline and mobile nontender   Adnexa without masses or tenderness    Anus and perineum normal   Rectovaginal normal sphincter tone without palpated masses or tenderness.    Assessment/Plan:  75 y.o. G71P0002 female for breast and pelvic exam.   1. Postmenopausal/atrophic genital changes. No significant hot flushes, night sweats, vaginal dryness or any vaginal bleeding. Continue to monitor report any issues or bleeding. 2. History of left breast cancer. Exam NED. Mammography 01/2016. Continue with annual mammography. SBE monthly reviewed. Continue follow up with her oncologist. 3. Osteoporosis. DEXA 12/2015 T score -3.6 distal radius. Being managed by her primary physician with no  treatment recommended by them. She'll continue to follow up with them for monitoring and treatment if needed. 4. Pap smear reported 2015 by Dr. Lowella Dell. No Pap smear done today. No history of abnormal Pap smears. We reviewed current screening guidelines based on age and she is comfortable with stop screening. 5. Colonoscopy 2016. Repeat at their recommended interval. 6. Health maintenance. No routine lab work done as patient does this at her primary physician's office. Follow up 1 year, sooner as needed.   Anastasio Auerbach MD, 11:21 AM 02/29/2016

## 2016-02-29 NOTE — Patient Instructions (Signed)

## 2016-03-10 ENCOUNTER — Other Ambulatory Visit (INDEPENDENT_AMBULATORY_CARE_PROVIDER_SITE_OTHER): Payer: Medicare Other

## 2016-03-10 DIAGNOSIS — D471 Chronic myeloproliferative disease: Secondary | ICD-10-CM | POA: Diagnosis not present

## 2016-03-10 DIAGNOSIS — K55069 Acute infarction of intestine, part and extent unspecified: Secondary | ICD-10-CM | POA: Diagnosis not present

## 2016-03-10 DIAGNOSIS — C946 Myelodysplastic disease, not classified: Secondary | ICD-10-CM

## 2016-03-10 DIAGNOSIS — Z853 Personal history of malignant neoplasm of breast: Secondary | ICD-10-CM | POA: Diagnosis not present

## 2016-03-10 DIAGNOSIS — D509 Iron deficiency anemia, unspecified: Secondary | ICD-10-CM | POA: Diagnosis not present

## 2016-03-11 ENCOUNTER — Telehealth: Payer: Self-pay | Admitting: *Deleted

## 2016-03-11 LAB — COMPREHENSIVE METABOLIC PANEL
ALT: 13 IU/L (ref 0–32)
AST: 11 IU/L (ref 0–40)
Albumin/Globulin Ratio: 2 (ref 1.2–2.2)
Albumin: 4.2 g/dL (ref 3.5–4.8)
Alkaline Phosphatase: 45 IU/L (ref 39–117)
BUN/Creatinine Ratio: 26 (ref 12–28)
BUN: 28 mg/dL — ABNORMAL HIGH (ref 8–27)
Bilirubin Total: 0.4 mg/dL (ref 0.0–1.2)
CO2: 27 mmol/L (ref 18–29)
Calcium: 10.5 mg/dL — ABNORMAL HIGH (ref 8.7–10.3)
Chloride: 100 mmol/L (ref 96–106)
Creatinine, Ser: 1.06 mg/dL — ABNORMAL HIGH (ref 0.57–1.00)
GFR calc Af Amer: 60 mL/min/{1.73_m2} (ref >59)
GFR calc non Af Amer: 52 mL/min/{1.73_m2} — ABNORMAL LOW (ref >59)
Globulin, Total: 2.1 g/dL (ref 1.5–4.5)
Glucose: 67 mg/dL (ref 65–99)
Potassium: 4.4 mmol/L (ref 3.5–5.2)
Sodium: 142 mmol/L (ref 134–144)
Total Protein: 6.3 g/dL (ref 6.0–8.5)

## 2016-03-11 LAB — CBC WITH DIFFERENTIAL/PLATELET
Basophils Absolute: 0.1 10*3/uL (ref 0.0–0.2)
Basos: 1 %
EOS (ABSOLUTE): 0.3 10*3/uL (ref 0.0–0.4)
Eos: 3 %
Hematocrit: 44.2 % (ref 34.0–46.6)
Hemoglobin: 14.2 g/dL (ref 11.1–15.9)
Immature Grans (Abs): 0.1 10*3/uL (ref 0.0–0.1)
Immature Granulocytes: 1 %
Lymphocytes Absolute: 1.4 10*3/uL (ref 0.7–3.1)
Lymphs: 14 %
MCH: 25 pg — ABNORMAL LOW (ref 26.6–33.0)
MCHC: 32.1 g/dL (ref 31.5–35.7)
MCV: 78 fL — ABNORMAL LOW (ref 79–97)
Monocytes Absolute: 0.6 10*3/uL (ref 0.1–0.9)
Monocytes: 6 %
Neutrophils Absolute: 8.1 10*3/uL — ABNORMAL HIGH (ref 1.4–7.0)
Neutrophils: 75 %
Platelets: 639 10*3/uL — ABNORMAL HIGH (ref 150–379)
RBC: 5.69 x10E6/uL — ABNORMAL HIGH (ref 3.77–5.28)
RDW: 15.6 % — ABNORMAL HIGH (ref 12.3–15.4)
WBC: 10.5 10*3/uL (ref 3.4–10.8)

## 2016-03-11 LAB — FERRITIN: Ferritin: 15 ng/mL (ref 15–150)

## 2016-03-11 NOTE — Telephone Encounter (Signed)
-----   Message from Annia Belt, MD sent at 03/11/2016  9:38 AM EST ----- Call pt: lab within range of previous iron level falling but hemoglobin remians normal  Will discuss at time of visit next week

## 2016-03-11 NOTE — Telephone Encounter (Signed)
Pt called - no answer; left message"lab within range of previous iron level falling but hemoglobin remians normal Will discuss at time of visit next week" per Dr Beryle Beams.  Then talked to husband - stated has been having trouble w/home phone. Message also given to him.

## 2016-03-17 ENCOUNTER — Ambulatory Visit (INDEPENDENT_AMBULATORY_CARE_PROVIDER_SITE_OTHER): Payer: Medicare Other | Admitting: Oncology

## 2016-03-17 ENCOUNTER — Encounter: Payer: Self-pay | Admitting: Oncology

## 2016-03-17 VITALS — BP 109/63 | HR 70 | Temp 97.6°F | Ht 60.0 in | Wt 103.8 lb

## 2016-03-17 DIAGNOSIS — C50912 Malignant neoplasm of unspecified site of left female breast: Secondary | ICD-10-CM | POA: Diagnosis not present

## 2016-03-17 DIAGNOSIS — R590 Localized enlarged lymph nodes: Secondary | ICD-10-CM | POA: Diagnosis not present

## 2016-03-17 DIAGNOSIS — Z7901 Long term (current) use of anticoagulants: Secondary | ICD-10-CM

## 2016-03-17 DIAGNOSIS — K909 Intestinal malabsorption, unspecified: Secondary | ICD-10-CM

## 2016-03-17 DIAGNOSIS — D471 Chronic myeloproliferative disease: Secondary | ICD-10-CM

## 2016-03-17 DIAGNOSIS — K551 Chronic vascular disorders of intestine: Secondary | ICD-10-CM | POA: Diagnosis not present

## 2016-03-17 DIAGNOSIS — Z882 Allergy status to sulfonamides status: Secondary | ICD-10-CM

## 2016-03-17 DIAGNOSIS — Z9221 Personal history of antineoplastic chemotherapy: Secondary | ICD-10-CM | POA: Diagnosis not present

## 2016-03-17 DIAGNOSIS — Z923 Personal history of irradiation: Secondary | ICD-10-CM | POA: Diagnosis not present

## 2016-03-17 DIAGNOSIS — D509 Iron deficiency anemia, unspecified: Secondary | ICD-10-CM

## 2016-03-17 DIAGNOSIS — D473 Essential (hemorrhagic) thrombocythemia: Secondary | ICD-10-CM | POA: Diagnosis not present

## 2016-03-17 DIAGNOSIS — K55069 Acute infarction of intestine, part and extent unspecified: Secondary | ICD-10-CM

## 2016-03-17 DIAGNOSIS — T386X5A Adverse effect of antigonadotrophins, antiestrogens, antiandrogens, not elsewhere classified, initial encounter: Secondary | ICD-10-CM

## 2016-03-17 DIAGNOSIS — Z79811 Long term (current) use of aromatase inhibitors: Secondary | ICD-10-CM

## 2016-03-17 DIAGNOSIS — Z87891 Personal history of nicotine dependence: Secondary | ICD-10-CM | POA: Diagnosis not present

## 2016-03-17 DIAGNOSIS — Z880 Allergy status to penicillin: Secondary | ICD-10-CM | POA: Diagnosis not present

## 2016-03-17 DIAGNOSIS — Z7982 Long term (current) use of aspirin: Secondary | ICD-10-CM | POA: Diagnosis not present

## 2016-03-17 DIAGNOSIS — Z170 Estrogen receptor positive status [ER+]: Secondary | ICD-10-CM

## 2016-03-17 DIAGNOSIS — M818 Other osteoporosis without current pathological fracture: Secondary | ICD-10-CM

## 2016-03-17 DIAGNOSIS — Z853 Personal history of malignant neoplasm of breast: Secondary | ICD-10-CM

## 2016-03-17 NOTE — Progress Notes (Signed)
Hematology and Oncology Follow Up Visit  Gwendolyn Williams [id number] 1940-09-08 75 y.o. 03/17/2016 6:32 PM   Principle Diagnosis: Encounter Diagnoses  Name Primary?  . 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 Myeloproliferative disorder ([place]) Yes  . History of breast cancer in female   . Chronic anticoagulation   . 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 She has been on full dose Coumadin anticoagulation and low-dose aspirin. She was recently changed to [place] in July 2017. She made last-minute plans to travel. She was not going to be able to get Coumadin levels checked.  I decided to transition her to the Xarelto based on evolving experience with the new oral anticoagulants with respect to decreased bleeding in patients on long-term anticoagulation compared with Coumadin and increasing experience with unusual site thrombotic events.    She was  diagnosed with a stage I, ER/PR positive, HER-2-negative, cancer of the left breast in August 2010. Oncotype DX score high risk. She underwent left lumpectomy on 12/26/2008 followed by adjuvant chemotherapy with 4 cycles of Taxotere plus Cytoxan between 02/16/2009 and 04/20/2009. She then had a course of radiation between February 14 and 07/02/2009. She was started on hormonal therapy with Arimidex which she continues at this time. BRCA1 and BRCA2 gene testing was negative.  She developed a large chest wall hematoma at time of the surgery related to tight anticoagulation which has long since resolved. She has one son. One daughter age 72(2016).    Interim History:   Overall she has had no interim medical problems but had a number of questions  today and brought her husband a retired Software engineer to the visit. He read a recent review which he forwarded to me about comparative effects of the different available oral anticoagulants. I have not had a chance to read this particular article but I am up-to-date on data from all of the clinical trials on these drugs. There have been no head-to-head comparison of any new oral anticoagulant against anything but warfarin. The clinical trials had different patient populations. The initial stroke prevention trial with Xarelto was the only study in the literature that had a average CHADSVASC score of 3.5 compare with all of the other studies with less sick patients and average score of 2. When one looks at the endpoint of rethrombosis, there are no significant differences between the drugs. In addition to her other problems, she has iron malabsorption anemia. I thought recent rise in her platelet count above her baseline may have a contribution from the iron deficiency. In fact platelet count came down from 600,000 to 500,000 when she received IV iron. Platelet count creeping up over 600,000 again at this time. Although her hemoglobin is 14, her ferritin is again drifting down and was 15 on December 11. We spent a long time talking about iron metabolism. She denies any headache, change in vision, slurred speech, focal weakness, or paresthesias. No abdominal pain. No hematochezia. No melena. No new breast lumps. Most recent mammogram done 01/22/2016 with postsurgical change but no new disease. She continues on Arimidex.  Medications: reviewed  Allergies:  Allergies  Allergen Reactions  . Penicillins   . Sulfa Antibiotics     Unable to recall    Review of Systems: See interim history Remaining ROS negative:  Physical Exam: Blood pressure 109/63, pulse 70, temperature 97.6 F (36.4 C), temperature source Oral, height 5' (1.524 m), weight 103 lb 12.8 oz (47.1 kg), SpO2 100 %. Wt Readings from Last 3  Encounters:  03/17/16 103 lb 12.8 oz (47.1 kg)  02/29/16 103 lb (46.7 kg)  09/24/15 103 lb 14.4 oz (47.1 kg)     General appearance: Petite Caucasian woman [name]: Pharynx no erythema, exudate, mass, or ulcer. No thyromegaly or thyroid nodules Lymph nodes: No cervical, supraclavicular, or axillary lymphadenopathy Breasts: Examined at last visit not today. Lungs: Clear to auscultation, resonant to percussion throughout Heart: Regular rhythm, no murmur, no gallop, no rub, no click, no edema Abdomen: Midline surgical incision well-healed. Soft, nontender, normal bowel sounds, no mass, no organomegaly Extremities: No edema, no calf tenderness Musculoskeletal: no joint deformities GU:  Vascular: Carotid pulses 2+, no bruits,  Neurologic: Alert, oriented, PERRLA, optic discs sharp and vessels normal, no hemorrhage or exudate, cranial nerves grossly normal, motor strength 5 over 5, reflexes 1+ symmetric, upper body coordination normal, gait normal, Skin: No rash or ecchymosis  Lab Results: CBC W/Diff    Component Value Date/Time   WBC 10.5 03/10/2016 0952   WBC 10.6 (H) 12/08/2014 1540   WBC 7.1 06/06/2014 1002   RBC 5.69 (H) 03/10/2016 0952   RBC 5.11 12/08/2014 1540   RBC 5.81 (H) 06/06/2014 1002   HGB 11.8 12/08/2014 1540   HCT 44.2 03/10/2016 0952   HCT 36.8 12/08/2014 1540   PLT 639 (H) 03/10/2016 0952   MCV 78 (L) 03/10/2016 0952   MCV 72.0 (L) 12/08/2014 1540   MCH 25.0 (L) 03/10/2016 0952   MCH 23.1 (L) 12/08/2014 1540   MCH 23.9 (L) 06/06/2014 1002   MCHC 32.1 03/10/2016 0952   MCHC 32.1 12/08/2014 1540   MCHC 31.0 06/06/2014 1002   RDW 15.6 (H) 03/10/2016 0952   RDW 17.1 (H) 12/08/2014 1540   LYMPHSABS 1.4 03/10/2016 0952   LYMPHSABS 1.4 12/08/2014 1540   MONOABS 0.6 12/08/2014 1540   EOSABS 0.3 03/10/2016 0952   BASOSABS 0.1 03/10/2016 0952   BASOSABS 0.1 12/08/2014 1540     Chemistry      Component Value Date/Time   NA 142 03/10/2016 0952   NA 137 12/08/2014  1540   K 4.4 03/10/2016 0952   K 4.2 12/08/2014 1540   CL 100 03/10/2016 0952   CL 105 08/03/2012 1529   CO2 27 03/10/2016 0952   CO2 25 12/08/2014 1540   BUN 28 (H) 03/10/2016 0952   BUN 27.4 (H) 12/08/2014 1540   CREATININE 1.06 (H) 03/10/2016 0952   CREATININE 1.1 12/08/2014 1540      Component Value Date/Time   CALCIUM 10.5 (H) 03/10/2016 0952   CALCIUM 9.9 12/08/2014 1540   ALKPHOS 45 03/10/2016 0952   ALKPHOS 42 12/08/2014 1540   AST 11 03/10/2016 0952   AST 23 12/08/2014 1540   ALT 13 03/10/2016 0952   ALT 21 12/08/2014 1540   BILITOT 0.4 03/10/2016 0952   BILITOT 0.65 12/08/2014 1540       Radiological Studies: No results found.  Impression:   #1. Myeloproliferative disorder: Essential thrombocythemia I am going to go ahead and give her another series of IV iron treatments. If platelet count remains consistently above 600,000, we discussed adding Hydrea to lower the count below 500,000. We also spent a long time discussing the rationale for platelet lowering drugs. There have been no good studies that have shown a direct correlation between  the absolute platelet number and thrombotic complications. She is on full dose anticoagulation as well as aspirin. I don't want to just treat the platelet number.  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 No subsequent thrombotic events now on Xarelto and low-dose aspirin. These will be continued indefinitely.  #3. Stage I, ER positive, HER-2 negative, Oncotype DX high risk cancer of the left breast treated with chemotherapy and now on hormonal therapy with Arimidex. Based on new data showing a slight improvement in disease free survival, we discussed extending her Arimidex for a total of 10 years at time of her last visit. She initially elected to stop at the five-year point then equivocated and went back on the hormone. A small lymph node palpable in her left axilla, this is likely benign. It remains small. Unchanged    Not pathologic-appearing on imaging. Will continue to follow clinically.  #4. Essential hypertension  #5. Recurrent iron deficiency anemia. Complete response to parenteral iron. Ferritin drifting down. See discussion above. Although hemoglobin is normal, I will go ahead and give her additional parenteral iron read after the holidays.  At least one hour spent with direct face-to-face patient contact and discussion of multiple issues above. Additional time for putting in orders for iron therapy.  CC: Patient Care Team: Gaynelle Arabian, MD as PCP - General (Family Medicine) Annia Belt, MD as Consulting Physician (Oncology) Rolm Bookbinder, MD as Consulting Physician (General Surgery)   Annia Belt, MD 12/18/20176:32 PM

## 2016-03-17 NOTE — Patient Instructions (Signed)
Schedule feraheme with short stay weekly x 2 for January or February Lab every 3 months next 06/02/16 MD visit 6 months

## 2016-03-27 DIAGNOSIS — M7062 Trochanteric bursitis, left hip: Secondary | ICD-10-CM | POA: Diagnosis not present

## 2016-04-09 ENCOUNTER — Ambulatory Visit (HOSPITAL_COMMUNITY)
Admission: RE | Admit: 2016-04-09 | Discharge: 2016-04-09 | Disposition: A | Discharge status: Home / Self Care | Payer: Medicare Other | Source: Ambulatory Visit | Attending: Oncology | Admitting: Oncology

## 2016-04-09 DIAGNOSIS — K909 Intestinal malabsorption, unspecified: Secondary | ICD-10-CM | POA: Insufficient documentation

## 2016-04-09 MED ORDER — SODIUM CHLORIDE 0.9 % IV SOLN
510.0000 mg | INTRAVENOUS | Status: DC
  Administered 2016-04-09: 510 mg via INTRAVENOUS
  Filled 2016-04-09: qty 17

## 2016-04-14 DIAGNOSIS — B349 Viral infection, unspecified: Secondary | ICD-10-CM | POA: Diagnosis not present

## 2016-04-18 ENCOUNTER — Ambulatory Visit (HOSPITAL_COMMUNITY)
Admission: RE | Admit: 2016-04-18 | Discharge: 2016-04-18 | Disposition: A | Discharge status: Home / Self Care | Payer: Medicare Other | Source: Ambulatory Visit | Attending: Oncology | Admitting: Oncology

## 2016-04-18 ENCOUNTER — Other Ambulatory Visit: Payer: Self-pay | Admitting: *Deleted

## 2016-04-18 DIAGNOSIS — K909 Intestinal malabsorption, unspecified: Secondary | ICD-10-CM | POA: Insufficient documentation

## 2016-04-18 MED ORDER — RIVAROXABAN 20 MG PO TABS: 20.0000 mg | ORAL_TABLET | Freq: Every day | ORAL | 3 refills | Status: DC

## 2016-04-18 NOTE — Telephone Encounter (Signed)
Pt also brought a form for u to complete if u cannot send rx electronically.

## 2016-04-18 NOTE — Progress Notes (Signed)
Pt came in today for her second dose of Feraheme.  She stated that "after her dose last week she went home and was calmy and was seeing stars and felt like she was going to pass out and went to the bath room and dry heaved."  She said that she has had some sort of bug for a week.  I called Dr Beryle Beams and it was decided that we would reschedule her for 2 weeks and see if the bug has left her system.

## 2016-04-28 ENCOUNTER — Other Ambulatory Visit: Payer: Self-pay | Admitting: Family Medicine

## 2016-04-28 ENCOUNTER — Ambulatory Visit
Admission: RE | Admit: 2016-04-28 | Discharge: 2016-04-28 | Disposition: A | Discharge status: Home / Self Care | Payer: Medicare Other | Source: Ambulatory Visit | Attending: Family Medicine | Admitting: Family Medicine

## 2016-04-28 DIAGNOSIS — M25511 Pain in right shoulder: Secondary | ICD-10-CM

## 2016-04-28 DIAGNOSIS — R05 Cough: Secondary | ICD-10-CM

## 2016-04-28 DIAGNOSIS — R059 Cough, unspecified: Secondary | ICD-10-CM

## 2016-04-28 DIAGNOSIS — R079 Chest pain, unspecified: Secondary | ICD-10-CM | POA: Diagnosis not present

## 2016-04-28 DIAGNOSIS — M549 Dorsalgia, unspecified: Secondary | ICD-10-CM | POA: Diagnosis not present

## 2016-05-01 ENCOUNTER — Other Ambulatory Visit (HOSPITAL_COMMUNITY): Payer: Self-pay | Admitting: *Deleted

## 2016-05-02 ENCOUNTER — Ambulatory Visit (HOSPITAL_COMMUNITY): Payer: Medicare Other

## 2016-05-07 ENCOUNTER — Telehealth: Payer: Self-pay | Admitting: *Deleted

## 2016-05-07 NOTE — Telephone Encounter (Signed)
Pt stated she became sick after iron infusion on Jan 10th; and had another one scheduled the Jan 19th but still wasn't feeling well and Dr Beryle Beams was called. Had another appt on 2/2 which she canceled b/c she still wasn't feeling well. Wants to know if she should re-schedule another appt or come in for labs to see if she needs the second infusion?

## 2016-05-08 ENCOUNTER — Other Ambulatory Visit: Payer: Self-pay | Admitting: Oncology

## 2016-05-08 DIAGNOSIS — D508 Other iron deficiency anemias: Secondary | ICD-10-CM

## 2016-05-08 NOTE — Telephone Encounter (Signed)
Pt called - no answer; left message to call back to schedule her lab appt.

## 2016-05-08 NOTE — Telephone Encounter (Signed)
Let's just have her come in for lab

## 2016-05-09 ENCOUNTER — Other Ambulatory Visit (INDEPENDENT_AMBULATORY_CARE_PROVIDER_SITE_OTHER): Payer: Medicare Other

## 2016-05-09 DIAGNOSIS — D508 Other iron deficiency anemias: Secondary | ICD-10-CM | POA: Diagnosis not present

## 2016-05-09 NOTE — Telephone Encounter (Signed)
Scheduled for labs today.

## 2016-05-10 LAB — FERRITIN: Ferritin: 372 ng/mL — ABNORMAL HIGH (ref 15–150)

## 2016-05-10 LAB — CBC WITH DIFFERENTIAL/PLATELET
Basophils Absolute: 0.1 10*3/uL (ref 0.0–0.2)
Basos: 1 %
EOS (ABSOLUTE): 0.3 10*3/uL (ref 0.0–0.4)
Eos: 3 %
Hematocrit: 42.3 % (ref 34.0–46.6)
Hemoglobin: 13.8 g/dL (ref 11.1–15.9)
Immature Grans (Abs): 0.1 10*3/uL (ref 0.0–0.1)
Immature Granulocytes: 1 %
Lymphocytes Absolute: 1.6 10*3/uL (ref 0.7–3.1)
Lymphs: 14 %
MCH: 25.2 pg — ABNORMAL LOW (ref 26.6–33.0)
MCHC: 32.6 g/dL (ref 31.5–35.7)
MCV: 77 fL — ABNORMAL LOW (ref 79–97)
Monocytes Absolute: 0.6 10*3/uL (ref 0.1–0.9)
Monocytes: 5 %
Neutrophils Absolute: 9.3 10*3/uL — ABNORMAL HIGH (ref 1.4–7.0)
Neutrophils: 76 %
Platelets: 585 10*3/uL — ABNORMAL HIGH (ref 150–379)
RBC: 5.48 x10E6/uL — ABNORMAL HIGH (ref 3.77–5.28)
RDW: 18.4 % — ABNORMAL HIGH (ref 12.3–15.4)
WBC: 11.9 10*3/uL — ABNORMAL HIGH (ref 3.4–10.8)

## 2016-05-13 ENCOUNTER — Telehealth: Payer: Self-pay | Admitting: *Deleted

## 2016-05-13 NOTE — Telephone Encounter (Signed)
Pt called / informed "iron stores now replenished. Platelet count down slightly from 639,000 to 585,000. Next lab scheduled for 06/02/16. I would like to see what that shows then decide whether or not we need to add medication to lower the platelet count." per Dr Beryle Beams. Stated she looked at results; "sounds good"; thanks for calling.

## 2016-05-13 NOTE — Telephone Encounter (Signed)
Called pt - no answer; left message to give me a call back. 

## 2016-05-13 NOTE — Telephone Encounter (Signed)
-----   Message from Annia Belt, MD sent at 05/12/2016  6:59 PM EST ----- Call pt: iron stores now replenished. Platelet count down slightly from 639,000 to 585,000. Next lab scheduled for 06/02/16. I would like to see what that shows then decide whether or not we need to add medication to lower the platelet count.

## 2016-05-22 DIAGNOSIS — Z853 Personal history of malignant neoplasm of breast: Secondary | ICD-10-CM | POA: Diagnosis not present

## 2016-05-22 DIAGNOSIS — K219 Gastro-esophageal reflux disease without esophagitis: Secondary | ICD-10-CM | POA: Diagnosis not present

## 2016-05-22 DIAGNOSIS — N183 Chronic kidney disease, stage 3 (moderate): Secondary | ICD-10-CM | POA: Diagnosis not present

## 2016-05-22 DIAGNOSIS — I129 Hypertensive chronic kidney disease with stage 1 through stage 4 chronic kidney disease, or unspecified chronic kidney disease: Secondary | ICD-10-CM | POA: Diagnosis not present

## 2016-05-22 DIAGNOSIS — E559 Vitamin D deficiency, unspecified: Secondary | ICD-10-CM | POA: Diagnosis not present

## 2016-05-22 DIAGNOSIS — E7800 Pure hypercholesterolemia, unspecified: Secondary | ICD-10-CM | POA: Diagnosis not present

## 2016-05-22 DIAGNOSIS — M810 Age-related osteoporosis without current pathological fracture: Secondary | ICD-10-CM | POA: Diagnosis not present

## 2016-05-22 DIAGNOSIS — C946 Myelodysplastic disease, not classified: Secondary | ICD-10-CM | POA: Diagnosis not present

## 2016-05-30 ENCOUNTER — Telehealth: Payer: Self-pay | Admitting: Family Medicine

## 2016-05-30 NOTE — Telephone Encounter (Signed)
APT. REMINDER CALL, LMTCB °

## 2016-06-02 ENCOUNTER — Other Ambulatory Visit (INDEPENDENT_AMBULATORY_CARE_PROVIDER_SITE_OTHER): Payer: Medicare Other

## 2016-06-02 DIAGNOSIS — Z853 Personal history of malignant neoplasm of breast: Secondary | ICD-10-CM | POA: Diagnosis not present

## 2016-06-02 DIAGNOSIS — K55069 Acute infarction of intestine, part and extent unspecified: Secondary | ICD-10-CM | POA: Diagnosis not present

## 2016-06-02 DIAGNOSIS — Z7901 Long term (current) use of anticoagulants: Secondary | ICD-10-CM | POA: Diagnosis not present

## 2016-06-02 DIAGNOSIS — K909 Intestinal malabsorption, unspecified: Secondary | ICD-10-CM | POA: Diagnosis not present

## 2016-06-02 DIAGNOSIS — C946 Myelodysplastic disease, not classified: Secondary | ICD-10-CM

## 2016-06-02 DIAGNOSIS — D471 Chronic myeloproliferative disease: Secondary | ICD-10-CM

## 2016-06-03 LAB — IRON AND TIBC
Iron Saturation: 36 % (ref 15–55)
Iron: 87 ug/dL (ref 27–139)
Total Iron Binding Capacity: 239 ug/dL — ABNORMAL LOW (ref 250–450)
UIBC: 152 ug/dL (ref 118–369)

## 2016-06-03 LAB — CBC WITH DIFFERENTIAL/PLATELET
Basophils Absolute: 0.1 10*3/uL (ref 0.0–0.2)
Basos: 1 %
EOS (ABSOLUTE): 0.3 10*3/uL (ref 0.0–0.4)
Eos: 3 %
Hematocrit: 45.4 % (ref 34.0–46.6)
Hemoglobin: 14.5 g/dL (ref 11.1–15.9)
Immature Grans (Abs): 0.1 10*3/uL (ref 0.0–0.1)
Immature Granulocytes: 1 %
Lymphocytes Absolute: 1.6 10*3/uL (ref 0.7–3.1)
Lymphs: 16 %
MCH: 25.3 pg — ABNORMAL LOW (ref 26.6–33.0)
MCHC: 31.9 g/dL (ref 31.5–35.7)
MCV: 79 fL (ref 79–97)
Monocytes Absolute: 0.6 10*3/uL (ref 0.1–0.9)
Monocytes: 6 %
Neutrophils Absolute: 7.6 10*3/uL — ABNORMAL HIGH (ref 1.4–7.0)
Neutrophils: 73 %
Platelets: 541 10*3/uL — ABNORMAL HIGH (ref 150–379)
RBC: 5.72 x10E6/uL — ABNORMAL HIGH (ref 3.77–5.28)
RDW: 20.3 % — ABNORMAL HIGH (ref 12.3–15.4)
WBC: 10.2 10*3/uL (ref 3.4–10.8)

## 2016-06-03 LAB — FERRITIN: Ferritin: 241 ng/mL — ABNORMAL HIGH (ref 15–150)

## 2016-06-06 ENCOUNTER — Telehealth: Payer: Self-pay | Admitting: *Deleted

## 2016-06-06 NOTE — Telephone Encounter (Signed)
-----   Message from Annia Belt, MD sent at 06/03/2016  6:20 PM EST ----- Call pt: hemoglobin 14.5 - could not be better. Iron stores good. Don't need another dose of iron. Platelet count now trending back down at 541,000 so we can continue to watch for now.

## 2016-06-06 NOTE — Telephone Encounter (Signed)
Pt called / informed "hemoglobin 14.5 - could not be better. Iron stores good. Don't need another dose of iron. Platelet count now trending back down at 541,000 so we can continue to watch for now." per Dr Beryle Beams. Stated she had already viewed results and thanks for calling.

## 2016-07-02 DIAGNOSIS — H1013 Acute atopic conjunctivitis, bilateral: Secondary | ICD-10-CM | POA: Diagnosis not present

## 2016-07-09 DIAGNOSIS — L821 Other seborrheic keratosis: Secondary | ICD-10-CM | POA: Diagnosis not present

## 2016-07-09 DIAGNOSIS — Z86018 Personal history of other benign neoplasm: Secondary | ICD-10-CM | POA: Diagnosis not present

## 2016-07-09 DIAGNOSIS — Z85828 Personal history of other malignant neoplasm of skin: Secondary | ICD-10-CM | POA: Diagnosis not present

## 2016-07-09 DIAGNOSIS — D225 Melanocytic nevi of trunk: Secondary | ICD-10-CM | POA: Diagnosis not present

## 2016-07-09 DIAGNOSIS — L570 Actinic keratosis: Secondary | ICD-10-CM | POA: Diagnosis not present

## 2016-07-09 DIAGNOSIS — L309 Dermatitis, unspecified: Secondary | ICD-10-CM | POA: Diagnosis not present

## 2016-08-14 DIAGNOSIS — M1612 Unilateral primary osteoarthritis, left hip: Secondary | ICD-10-CM | POA: Diagnosis not present

## 2016-09-01 ENCOUNTER — Other Ambulatory Visit (INDEPENDENT_AMBULATORY_CARE_PROVIDER_SITE_OTHER): Payer: Medicare Other

## 2016-09-01 DIAGNOSIS — C946 Myelodysplastic disease, not classified: Secondary | ICD-10-CM | POA: Diagnosis present

## 2016-09-01 DIAGNOSIS — K55069 Acute infarction of intestine, part and extent unspecified: Secondary | ICD-10-CM | POA: Diagnosis not present

## 2016-09-01 DIAGNOSIS — Z7901 Long term (current) use of anticoagulants: Secondary | ICD-10-CM

## 2016-09-01 DIAGNOSIS — D471 Chronic myeloproliferative disease: Secondary | ICD-10-CM | POA: Diagnosis not present

## 2016-09-01 DIAGNOSIS — Z853 Personal history of malignant neoplasm of breast: Secondary | ICD-10-CM

## 2016-09-01 DIAGNOSIS — K909 Intestinal malabsorption, unspecified: Secondary | ICD-10-CM

## 2016-09-02 LAB — COMPREHENSIVE METABOLIC PANEL
ALT: 13 IU/L (ref 0–32)
AST: 13 IU/L (ref 0–40)
Albumin/Globulin Ratio: 2.1 (ref 1.2–2.2)
Albumin: 4.2 g/dL (ref 3.5–4.8)
Alkaline Phosphatase: 50 IU/L (ref 39–117)
BUN/Creatinine Ratio: 22 (ref 12–28)
BUN: 25 mg/dL (ref 8–27)
Bilirubin Total: 0.5 mg/dL (ref 0.0–1.2)
CO2: 25 mmol/L (ref 18–29)
Calcium: 10.3 mg/dL (ref 8.7–10.3)
Chloride: 100 mmol/L (ref 96–106)
Creatinine, Ser: 1.16 mg/dL — ABNORMAL HIGH (ref 0.57–1.00)
GFR calc Af Amer: 54 mL/min/{1.73_m2} — ABNORMAL LOW (ref >59)
GFR calc non Af Amer: 47 mL/min/{1.73_m2} — ABNORMAL LOW (ref >59)
Globulin, Total: 2 g/dL (ref 1.5–4.5)
Glucose: 63 mg/dL — ABNORMAL LOW (ref 65–99)
Potassium: 4.5 mmol/L (ref 3.5–5.2)
Sodium: 142 mmol/L (ref 134–144)
Total Protein: 6.2 g/dL (ref 6.0–8.5)

## 2016-09-02 LAB — CBC WITH DIFFERENTIAL/PLATELET
Basophils Absolute: 0.1 10*3/uL (ref 0.0–0.2)
Basos: 1 %
EOS (ABSOLUTE): 0.2 10*3/uL (ref 0.0–0.4)
Eos: 2 %
Hematocrit: 47.1 % — ABNORMAL HIGH (ref 34.0–46.6)
Hemoglobin: 15.7 g/dL (ref 11.1–15.9)
Immature Grans (Abs): 0.1 10*3/uL (ref 0.0–0.1)
Immature Granulocytes: 1 %
Lymphocytes Absolute: 1.3 10*3/uL (ref 0.7–3.1)
Lymphs: 14 %
MCH: 27.8 pg (ref 26.6–33.0)
MCHC: 33.3 g/dL (ref 31.5–35.7)
MCV: 84 fL (ref 79–97)
Monocytes Absolute: 0.4 10*3/uL (ref 0.1–0.9)
Monocytes: 5 %
Neutrophils Absolute: 7.2 10*3/uL — ABNORMAL HIGH (ref 1.4–7.0)
Neutrophils: 77 %
Platelets: 503 10*3/uL — ABNORMAL HIGH (ref 150–379)
RBC: 5.64 x10E6/uL — ABNORMAL HIGH (ref 3.77–5.28)
RDW: 16.4 % — ABNORMAL HIGH (ref 12.3–15.4)
WBC: 9.2 10*3/uL (ref 3.4–10.8)

## 2016-09-02 LAB — URIC ACID: Uric Acid: 6.4 mg/dL (ref 2.5–7.1)

## 2016-09-02 LAB — IRON AND TIBC
Iron Saturation: 20 % (ref 15–55)
Iron: 57 ug/dL (ref 27–139)
Total Iron Binding Capacity: 282 ug/dL (ref 250–450)
UIBC: 225 ug/dL (ref 118–369)

## 2016-09-02 LAB — LACTATE DEHYDROGENASE: LDH: 279 IU/L — ABNORMAL HIGH (ref 119–226)

## 2016-09-02 LAB — FERRITIN: Ferritin: 67 ng/mL (ref 15–150)

## 2016-09-08 ENCOUNTER — Telehealth: Payer: Self-pay | Admitting: *Deleted

## 2016-09-08 NOTE — Telephone Encounter (Signed)
-----   Message from Annia Belt, MD sent at 09/04/2016  2:43 PM EDT ----- Call pt: platelets stable at 503,000. Iron studies normal

## 2016-09-08 NOTE — Telephone Encounter (Signed)
Pt called / informed "platelets stable at 503,000. Iron studies normal" per Dr Beryle Beams. Stated she had seen results and has an appt tomorrow.

## 2016-09-09 ENCOUNTER — Ambulatory Visit (INDEPENDENT_AMBULATORY_CARE_PROVIDER_SITE_OTHER): Payer: Medicare Other | Admitting: Oncology

## 2016-09-09 ENCOUNTER — Encounter: Payer: Self-pay | Admitting: Oncology

## 2016-09-09 VITALS — BP 105/68 | HR 72 | Temp 97.9°F | Ht 61.0 in | Wt 102.8 lb

## 2016-09-09 DIAGNOSIS — Z7901 Long term (current) use of anticoagulants: Secondary | ICD-10-CM

## 2016-09-09 DIAGNOSIS — Z7982 Long term (current) use of aspirin: Secondary | ICD-10-CM

## 2016-09-09 DIAGNOSIS — D473 Essential (hemorrhagic) thrombocythemia: Secondary | ICD-10-CM

## 2016-09-09 DIAGNOSIS — Z9221 Personal history of antineoplastic chemotherapy: Secondary | ICD-10-CM | POA: Diagnosis not present

## 2016-09-09 DIAGNOSIS — Z87891 Personal history of nicotine dependence: Secondary | ICD-10-CM | POA: Diagnosis not present

## 2016-09-09 DIAGNOSIS — Z882 Allergy status to sulfonamides status: Secondary | ICD-10-CM

## 2016-09-09 DIAGNOSIS — Z880 Allergy status to penicillin: Secondary | ICD-10-CM

## 2016-09-09 DIAGNOSIS — Z923 Personal history of irradiation: Secondary | ICD-10-CM | POA: Diagnosis not present

## 2016-09-09 DIAGNOSIS — D509 Iron deficiency anemia, unspecified: Secondary | ICD-10-CM

## 2016-09-09 DIAGNOSIS — Z853 Personal history of malignant neoplasm of breast: Secondary | ICD-10-CM | POA: Diagnosis not present

## 2016-09-09 DIAGNOSIS — K55069 Acute infarction of intestine, part and extent unspecified: Secondary | ICD-10-CM | POA: Diagnosis not present

## 2016-09-09 DIAGNOSIS — I10 Essential (primary) hypertension: Secondary | ICD-10-CM

## 2016-09-09 DIAGNOSIS — M160 Bilateral primary osteoarthritis of hip: Secondary | ICD-10-CM

## 2016-09-09 NOTE — Progress Notes (Signed)
Hematology and Oncology Follow Up Visit  Gwendolyn Williams [id number] Jul 03, 1940 75 y.o. 09/09/2016 2:37 PM   Principle Diagnosis: Encounter Diagnosis  Name Primary?  . 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 She has been on full dose Coumadin anticoagulation and low-dose aspirin. She was recently changed to [place] in July 2017. She made last-minute plans to travel. She was not going to be able to get Coumadin levels checked.  I decided to transition her to the Xarelto based on evolving experience with the new oral anticoagulants with respect to decreased bleeding in patients on long-term anticoagulation compared with Coumadin and increasing experience with unusual site thrombotic events.    She was  diagnosed with a stage I, ER/PR positive, HER-2-negative, cancer of the left breast in August 2010. Oncotype DX score high risk. She underwent left lumpectomy on 12/26/2008 followed by adjuvant chemotherapy with 4 cycles of Taxotere plus Cytoxan between 02/16/2009 and 04/20/2009. She then had a course of radiation between February 14 and 07/02/2009. She was started on hormonal therapy with Arimidex which she continues at this time. BRCA1 and BRCA2 gene testing was negative.  She developed a large chest wall hematoma at time of the surgery related to tight anticoagulation which has long since resolved. She has one son. One daughter age 65(2016).  Interim History:   With parenteral iron replacement her platelet count has fallen back to her previous baseline with most recent value of 503,000 on 09/01/2016. She has no neurologic signs or symptoms. She is felt no new breast masses. She is tolerating Xarelto well with  no clinical bleeding or excessive bruising. Her main complaint today is a acute flare up of chronic right hip pain which started about 6 weeks ago. No strenuous activities. She is due to see her orthopedic surgeon tomorrow for further evaluation.  Medications: reviewed  Allergies:  Allergies  Allergen Reactions  . Penicillins   . Sulfa Antibiotics     Unable to recall    Review of Systems:  See interim history  Remaining ROS negative:   Physical Exam: Blood pressure 105/68, pulse 72, temperature 97.9 F (36.6 C), temperature source Oral, height '5\' 1"'$  (1.549 m), weight 102 lb 12.8 oz (46.6 kg), SpO2 100 %. Wt Readings from Last 3 Encounters:  09/09/16 102 lb 12.8 oz (46.6 kg)  04/09/16 100 lb (45.4 kg)  03/17/16 103 lb 12.8 oz (47.1 kg)     General appearance: Fr[place] Caucasian woman  [name]: Pharynx no erythema, exudate, mass, or ulcer. No thyromegaly or thyroid nodules Lymph nodes: No cervical, supraclavicular, or axillary lymphadenopathy Breasts: No abnormal skin changes, no dominant mass in either breast. Significant surgical disfiguration of the left breast with deviation of the nipple towards the midline and overall deformity of the breast. Very prominent upper ribs.  Lungs: Clear to auscultation, resonant to percussion throughout Heart: Regular rhythm, no murmur, no gallop, no rub, no click, no edema Abdomen: Soft, nontender, normal bowel sounds, no mass, no organomegaly. Multiple surgical scars. Surgical staples are easily palpable left paramedian above and below the umbilicus. Extremities: No edema, no calf tenderness Musculoskeletal: no joint deformities GU:  Vascular: Carotid pulses 2+, no bruits, distal pulses: Dorsalis pedis 1+ symmetric Neurologic: Alert, oriented, PERRLA, optic discs sharp and vessels normal, no hemorrhage or exudate, cranial nerves  grossly normal, motor strength 5 over 5, reflexes 1+ symmetric at the biceps, absent symmetric at the knees , upper body  coordination normal, gait normal but limited by pain , Skin: No rash or ecchymosis  Lab Results: CBC W/Diff    Component Value Date/Time   WBC 9.2 09/01/2016 1032   WBC 10.6 (H) 12/08/2014 1540   WBC 7.1 06/06/2014 1002   RBC 5.64 (H) 09/01/2016 1032   RBC 5.11 12/08/2014 1540   RBC 5.81 (H) 06/06/2014 1002   HGB 15.7 09/01/2016 1032   HGB 11.8 12/08/2014 1540   HCT 47.1 (H) 09/01/2016 1032   HCT 36.8 12/08/2014 1540   PLT 503 (H) 09/01/2016 1032   MCV 84 09/01/2016 1032   MCV 72.0 (L) 12/08/2014 1540   MCH 27.8 09/01/2016 1032   MCH 23.1 (L) 12/08/2014 1540   MCH 23.9 (L) 06/06/2014 1002   MCHC 33.3 09/01/2016 1032   MCHC 32.1 12/08/2014 1540   MCHC 31.0 06/06/2014 1002   RDW 16.4 (H) 09/01/2016 1032   RDW 17.1 (H) 12/08/2014 1540   LYMPHSABS 1.3 09/01/2016 1032   LYMPHSABS 1.4 12/08/2014 1540   MONOABS 0.6 12/08/2014 1540   EOSABS 0.2 09/01/2016 1032   BASOSABS 0.1 09/01/2016 1032   BASOSABS 0.1 12/08/2014 1540     Chemistry      Component Value Date/Time   NA 142 09/01/2016 1032   NA 137 12/08/2014 1540   K 4.5 09/01/2016 1032   K 4.2 12/08/2014 1540   CL 100 09/01/2016 1032   CL 105 08/03/2012 1529   CO2 25 09/01/2016 1032   CO2 25 12/08/2014 1540   BUN 25 09/01/2016 1032   BUN 27.4 (H) 12/08/2014 1540   CREATININE 1.16 (H) 09/01/2016 1032   CREATININE 1.1 12/08/2014 1540      Component Value Date/Time   CALCIUM 10.3 09/01/2016 1032   CALCIUM 9.9 12/08/2014 1540   ALKPHOS 50 09/01/2016 1032   ALKPHOS 42 12/08/2014 1540   AST 13 09/01/2016 1032   AST 23 12/08/2014 1540   ALT 13 09/01/2016 1032   ALT 21 12/08/2014 1540   BILITOT 0.5 09/01/2016 1032   BILITOT 0.65 12/08/2014 1540       Radiological Studies: No results found.  Impression:  1. Myeloproliferative disorder: Essential thrombocythemia Account now back to her baseline of approximately 500,000 following parenteral iron replacement. I will still defer platelet lowering therapy at this  time. Continue to monitor blood counts every 3 months.  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 No subsequent thrombotic events now on Xarelto and low-dose aspirin. These will be continued indefinitely.  #3. Stage I, ER positive, HER-2 negative, Oncotype DX high risk cancer of the left breast treated with chemotherapy and now on hormonal therapy with Arimidex. Based on new data showing a slight improvement in disease free survival, we discussed extending her Arimidex for a total of 10 years , She initially elected to stop at the five-year point then equivocated and went back on the hormone. A small lymph node palpable in her left axilla, this is likely benign. It remains small. Unchanged  Not pathologic-appearing on imaging. Will continue to follow clinically. Next mammogram due in October.  #4. Essential hypertension  #5. iron deficiency anemia. 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  Continue intermittent parenteral iron infusions as needed. Following recent IV iron hemoglobin has come up to 15.7 g with peak ferritin 372 which is drifted down to 67 as of June 4. Continue to monitor iron status.  #  6. Degenerative arthritis of the hips now increased symptoms on the right. We went through a differential for her acute right hip pain. Although possible, I think it is unlikely that she is having any bleeding into the joint related to her anticoagulant. She will see her orthopedist tomorrow. I'm sure that imaging studies will be done for further evaluation. She may need to have a joint replacement.  CC: Patient Care Team: Gaynelle Arabian, MD as PCP - General (Family Medicine) Annia Belt, MD as Consulting Physician (Oncology) Rolm Bookbinder, MD as Consulting Physician (General Surgery)   Murriel Hopper, MD, [place]  Hematology-Oncology/Internal Medicine     6/12/20182:37 PM

## 2016-09-09 NOTE — Patient Instructions (Addendum)
Continue very 3 month labs MD visit 6 months - 1-2 weeks after lab  Mammogram in October - to be scheduled

## 2016-09-10 DIAGNOSIS — M1612 Unilateral primary osteoarthritis, left hip: Secondary | ICD-10-CM | POA: Diagnosis not present

## 2016-09-10 DIAGNOSIS — M169 Osteoarthritis of hip, unspecified: Secondary | ICD-10-CM | POA: Diagnosis not present

## 2016-09-10 DIAGNOSIS — M25552 Pain in left hip: Secondary | ICD-10-CM | POA: Diagnosis not present

## 2016-09-19 ENCOUNTER — Encounter: Payer: Self-pay | Admitting: Oncology

## 2016-09-19 NOTE — Progress Notes (Signed)
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  She has been on anticoagulation since that time.  She was changed from warfarin to Xarelto in July 2017. She is scheduled for left total hip replacement on October 21, 2016 by Dr. Paralee Cancel. Recommendation: Her creatinine clearance is 50 mL/min.  I have instructed her to stop her Xarelto for 48 hours prior to surgery.  If surgery is done early in the morning and wounds are dry, Xarelto could be resumed on the evening of surgery, otherwise I would resume on the morning of postoperative day 1. Continue low-dose aspirin 81 mg before and after surgery.  Murriel Hopper, MD, [place]  Hematology-Oncology/Internal Medicine [phone]

## 2016-09-23 ENCOUNTER — Telehealth: Payer: Self-pay | Admitting: *Deleted

## 2016-09-23 NOTE — Telephone Encounter (Signed)
Pt called / informed "It will increase her risk of bleeding. Would try ES Tyenol; May need percocet or tramadol if this not working. Ortho should prescribe these if needed." per Dr Beryle Beams. Stated she had bad experience with Percocet; she will try ES Tylenol but will call her doctor if she needs Tramadol.

## 2016-09-23 NOTE — Telephone Encounter (Signed)
Pt states she's having hip surgery and now her back is out. And she is taking Aleve; wants to know if she can take it on a regular basis w/o it affecting Xarelto?

## 2016-09-23 NOTE — Telephone Encounter (Signed)
It will increase her risk of bleeding. Would try ES Tyenol; May need percocet or tramadol if this not working. Ortho should prescribe these if needed.

## 2016-09-24 ENCOUNTER — Encounter (HOSPITAL_COMMUNITY): Payer: Self-pay | Admitting: *Deleted

## 2016-09-30 ENCOUNTER — Encounter (HOSPITAL_COMMUNITY): Payer: Self-pay

## 2016-09-30 NOTE — Patient Instructions (Signed)
Gwendolyn Williams  09/30/2016   Your procedure is scheduled on: 10/07/16  Report to [facility] Main  Entrance   Report to admitting at    915 AM   Call this number if you have problems the morning of surgery  [phone]   Remember: ONLY 1 PERSON MAY GO WITH YOU TO SHORT STAY TO GET  READY MORNING OF [place].  Do not eat food or drink liquids :After Midnight.     Take these medicines the morning of surgery with A SIP OF WATER: nexium, arimidex, lidocaine patch,asa per MD clearance on chart                                You may not have any metal on your body including hair pins and              piercings  Do not wear jewelry, make-up, lotions, powders or perfumes, deodorant             Do not wear nail polish.  Do not shave  48 hours prior to surgery.                Do not bring valuables to the hospital. [place].  Contacts, dentures or bridgework may not be worn into surgery.  Leave suitcase in the car. After surgery it may be brought to your room.               Please read over the following fact sheets you were given: _____________________________________________________________________            [facility] - Preparing for Surgery Before surgery, you can play an important role.  Because skin is not sterile, your skin needs to be as free of germs as possible.  You can reduce the number of germs on your skin by washing with CHG (chlorahexidine gluconate) soap before surgery.  CHG is an antiseptic cleaner which kills germs and bonds with the skin to continue killing germs even after washing. Please DO NOT use if you have an allergy to CHG or antibacterial soaps.  If your skin becomes reddened/irritated stop using the CHG and inform your nurse when you arrive at Short Stay. Do not shave (including legs and underarms) for at least 48 hours prior to the first CHG shower.  You may shave your  face/neck. Please follow these instructions carefully:  1.  Shower with CHG Soap the night before surgery and the  morning of Surgery.  2.  If you choose to wash your hair, wash your hair first as usual with your  normal  shampoo.  3.  After you shampoo, rinse your hair and body thoroughly to remove the  shampoo.                           4.  Use CHG as you would any other liquid soap.  You can apply chg directly  to the skin and wash                       Gently with a scrungie or clean washcloth.  5.  Apply the CHG Soap to your body  ONLY FROM THE NECK DOWN.   Do not use on face/ open                           Wound or open sores. Avoid contact with eyes, ears mouth and genitals (private parts).                       Wash face,  Genitals (private parts) with your normal soap.             6.  Wash thoroughly, paying special attention to the area where your surgery  will be performed.  7.  Thoroughly rinse your body with warm water from the neck down.  8.  DO NOT shower/wash with your normal soap after using and rinsing off  the CHG Soap.                9.  Pat yourself dry with a clean towel.            10.  Wear clean pajamas.            11.  Place clean sheets on your bed the night of your first shower and do not  sleep with pets. Day of Surgery : Do not apply any lotions/deodorants the morning of surgery.  Please wear clean clothes to the hospital/surgery center.  FAILURE TO FOLLOW THESE INSTRUCTIONS MAY RESULT IN THE CANCELLATION OF YOUR SURGERY PATIENT SIGNATURE_________________________________  NURSE SIGNATURE__________________________________  ________________________________________________________________________  WHAT IS A BLOOD TRANSFUSION? Blood Transfusion Information  A transfusion is the replacement of blood or some of its parts. Blood is made up of multiple cells which provide different functions.  Red blood cells carry oxygen and are used for blood loss  replacement.  White blood cells fight against infection.  Platelets control bleeding.  Plasma helps clot blood.  Other blood products are available for specialized needs, such as hemophilia or other clotting disorders. BEFORE THE TRANSFUSION  Who gives blood for transfusions?   Healthy volunteers who are fully evaluated to make sure their blood is safe. This is blood bank blood. Transfusion therapy is the safest it has ever been in the practice of medicine. Before blood is taken from a donor, a complete history is taken to make sure that person has no history of diseases nor engages in risky social behavior (examples are intravenous drug use or sexual activity with multiple partners). The donor's travel history is screened to minimize risk of transmitting infections, such as malaria. The donated blood is tested for signs of infectious diseases, such as HIV and hepatitis. The blood is then tested to be sure it is compatible with you in order to minimize the chance of a transfusion reaction. If you or a relative donates blood, this is often done in anticipation of surgery and is not appropriate for emergency situations. It takes many days to process the donated blood. RISKS AND COMPLICATIONS Although transfusion therapy is very safe and saves many lives, the main dangers of transfusion include:   Getting an infectious disease.  Developing a transfusion reaction. This is an allergic reaction to something in the blood you were given. Every precaution is taken to prevent this. The decision to have a blood transfusion has been considered carefully by your caregiver before blood is given. Blood is not given unless the benefits outweigh the risks. AFTER THE TRANSFUSION  Right after receiving a blood transfusion, you will usually feel much  better and more energetic. This is especially true if your red blood cells have gotten low (anemic). The transfusion raises the level of the red blood cells which  carry oxygen, and this usually causes an energy increase.  The nurse administering the transfusion will monitor you carefully for complications. HOME CARE INSTRUCTIONS  No special instructions are needed after a transfusion. You may find your energy is better. Speak with your caregiver about any limitations on activity for underlying diseases you may have. SEEK MEDICAL CARE IF:   Your condition is not improving after your transfusion.  You develop redness or irritation at the intravenous (IV) site. SEEK IMMEDIATE MEDICAL CARE IF:  Any of the following symptoms occur over the next 12 hours:  Shaking chills.  You have a temperature by mouth above 102 F (38.9 C), not controlled by medicine.  Chest, back, or muscle pain.  People around you feel you are not acting correctly or are confused.  Shortness of breath or difficulty breathing.  Dizziness and fainting.  You get a rash or develop hives.  You have a decrease in urine output.  Your urine turns a dark color or changes to pink, red, or brown. Any of the following symptoms occur over the next 10 days:  You have a temperature by mouth above 102 F (38.9 C), not controlled by medicine.  Shortness of breath.  Weakness after normal activity.  The white part of the eye turns yellow (jaundice).  You have a decrease in the amount of urine or are urinating less often.  Your urine turns a dark color or changes to pink, red, or brown. Document Released: 03/14/2000 Document Revised: 06/09/2011 Document Reviewed: 11/01/2007 ExitCare Patient Information 2014 [place].  _______________________________________________________________________  Incentive Spirometer  An incentive spirometer is a tool that can help keep your lungs clear and active. This tool measures how well you are filling your lungs with each breath. Taking long deep breaths may help reverse or decrease the chance of developing breathing (pulmonary) problems  (especially infection) following:  A long period of time when you are unable to move or be active. BEFORE THE PROCEDURE   If the spirometer includes an indicator to show your best effort, your nurse or respiratory therapist will set it to a desired goal.  If possible, sit up straight or lean slightly forward. Try not to slouch.  Hold the incentive spirometer in an upright position. INSTRUCTIONS FOR USE  1. Sit on the edge of your bed if possible, or sit up as far as you can in bed or on a chair. 2. Hold the incentive spirometer in an upright position. 3. Breathe out normally. 4. Place the mouthpiece in your mouth and seal your lips tightly around it. 5. Breathe in slowly and as deeply as possible, raising the piston or the ball toward the top of the column. 6. Hold your breath for 3-5 seconds or for as long as possible. Allow the piston or ball to fall to the bottom of the column. 7. Remove the mouthpiece from your mouth and breathe out normally. 8. Rest for a few seconds and repeat Steps 1 through 7 at least 10 times every 1-2 hours when you are awake. Take your time and take a few normal breaths between deep breaths. 9. The spirometer may include an indicator to show your best effort. Use the indicator as a goal to work toward during each repetition. 10. After each set of 10 deep breaths, practice coughing to be sure  your lungs are clear. If you have an incision (the cut made at the time of surgery), support your incision when coughing by placing a pillow or rolled up towels firmly against it. Once you are able to get out of bed, walk around indoors and cough well. You may stop using the incentive spirometer when instructed by your caregiver.  RISKS AND COMPLICATIONS  Take your time so you do not get dizzy or light-headed.  If you are in pain, you may need to take or ask for pain medication before doing incentive spirometry. It is harder to take a deep breath if you are having  pain. AFTER USE  Rest and breathe slowly and easily.  It can be helpful to keep track of a log of your progress. Your caregiver can provide you with a simple table to help with this. If you are using the spirometer at home, follow these instructions: [place] IF:   You are having difficultly using the spirometer.  You have trouble using the spirometer as often as instructed.  Your pain medication is not giving enough relief while using the spirometer.  You develop fever of 100.5 F (38.1 C) or higher. SEEK IMMEDIATE MEDICAL CARE IF:   You cough up bloody sputum that had not been present before.  You develop fever of 102 F (38.9 C) or greater.  You develop worsening pain at or near the incision site. MAKE SURE YOU:   Understand these instructions.  Will watch your condition.  Will get help right away if you are not doing well or get worse. Document Released: 07/28/2006 Document Revised: 06/09/2011 Document Reviewed: 09/28/2006 [facility] Patient Information 2014 [place], [place].   ________________________________________________________________________

## 2016-09-30 NOTE — Progress Notes (Signed)
Hi  Looking at orders for Gwendolyn Williams and noticed you had put in Ancef on 6/28 and Vanc on 6/21. I am to follow the latest orders. Just wanted to make sure that is what you wanted ! Thanks

## 2016-10-03 ENCOUNTER — Encounter (HOSPITAL_COMMUNITY): Payer: Self-pay

## 2016-10-03 ENCOUNTER — Encounter (HOSPITAL_COMMUNITY)
Admission: RE | Admit: 2016-10-03 | Discharge: 2016-10-03 | Disposition: A | Discharge status: Home / Self Care | Payer: Medicare Other | Source: Ambulatory Visit | Attending: Orthopedic Surgery | Admitting: Orthopedic Surgery

## 2016-10-03 DIAGNOSIS — Z01818 Encounter for other preprocedural examination: Secondary | ICD-10-CM | POA: Insufficient documentation

## 2016-10-03 DIAGNOSIS — R9431 Abnormal electrocardiogram [ECG] [EKG]: Secondary | ICD-10-CM | POA: Insufficient documentation

## 2016-10-03 HISTORY — DX: Pneumonia, unspecified organism: J18.9

## 2016-10-03 HISTORY — DX: Vascular disorder of intestine, unspecified: K55.9

## 2016-10-03 HISTORY — DX: Gastro-esophageal reflux disease without esophagitis: K21.9

## 2016-10-03 HISTORY — DX: Other specified postprocedural states: R11.2

## 2016-10-03 HISTORY — DX: Unspecified osteoarthritis, unspecified site: M19.90

## 2016-10-03 HISTORY — DX: Other specified postprocedural states: Z98.890

## 2016-10-03 LAB — SURGICAL PCR SCREEN
MRSA, PCR: NEGATIVE
Staphylococcus aureus: NEGATIVE

## 2016-10-03 LAB — BASIC METABOLIC PANEL
Anion gap: 8 (ref 5–15)
BUN: 37 mg/dL — ABNORMAL HIGH (ref 6–20)
CO2: 27 mmol/L (ref 22–32)
Calcium: 9.8 mg/dL (ref 8.9–10.3)
Chloride: 104 mmol/L (ref 101–111)
Creatinine, Ser: 1.13 mg/dL — ABNORMAL HIGH (ref 0.44–1.00)
GFR calc Af Amer: 54 mL/min — ABNORMAL LOW (ref >60)
GFR calc non Af Amer: 47 mL/min — ABNORMAL LOW (ref >60)
Glucose, Bld: 74 mg/dL (ref 65–99)
Potassium: 3.9 mmol/L (ref 3.5–5.1)
Sodium: 139 mmol/L (ref 135–145)

## 2016-10-03 LAB — CBC
HCT: 45 % (ref 36.0–46.0)
Hemoglobin: 14.7 g/dL (ref 12.0–15.0)
MCH: 26.9 pg (ref 26.0–34.0)
MCHC: 32.7 g/dL (ref 30.0–36.0)
MCV: 82.3 fL (ref 78.0–100.0)
Platelets: 589 10*3/uL — ABNORMAL HIGH (ref 150–400)
RBC: 5.47 MIL/uL — ABNORMAL HIGH (ref 3.87–5.11)
RDW: 15.3 % (ref 11.5–15.5)
WBC: 12.5 10*3/uL — ABNORMAL HIGH (ref 4.0–10.5)

## 2016-10-03 NOTE — Progress Notes (Signed)
CBC and BMP routed to Dr. Alvan Dame via epic done 10/03/16 at preop

## 2016-10-03 NOTE — Progress Notes (Signed)
cxr 04/28/16 epic Clearance Dr. Beryle Beams 09/09/16 w/ Xarelto instructions on chart  09/17/16 clearance Dr. Marisue Humble on chart OV note 05/22/16 Dr. Marisue Humble on chart.

## 2016-10-06 NOTE — H&P (Signed)
TOTAL HIP ADMISSION H&P  Patient is admitted for left total hip arthroplasty, anterior approach.  Subjective:  Chief Complaint:   Left hip primary OA / pain  HPI: Gwendolyn Williams, 76 y.o. female, has a history of pain and functional disability in the left hip(s) due to arthritis and patient has failed non-surgical conservative treatments for greater than 12 weeks to include NSAID's and/or analgesics, corticosteriod injections and activity modification.  Onset of symptoms was gradual starting 5-7 years ago with gradually worsening course since that time.The patient noted no past surgery on the left hip(s).  Patient currently rates pain in the left hip at 9 out of 10 with activity. Patient has night pain, worsening of pain with activity and weight bearing, trendelenberg gait, pain that interfers with activities of daily living, pain with passive range of motion, crepitus and joint swelling. Patient has evidence of periarticular osteophytes and joint space narrowing by imaging studies. This condition presents safety issues increasing the risk of falls.   There is no current active infection.   Risks, benefits and expectations were discussed with the patient.  Risks including but not limited to the risk of anesthesia, blood clots, nerve damage, blood vessel damage, failure of the prosthesis, infection and up to and including death.  Patient understand the risks, benefits and expectations and wishes to proceed with surgery.   PCP: Gaynelle Arabian, MD  D/C Plans:       Home   Post-op Meds:       No Rx given   Tranexamic Acid:      Not to be given   Decadron:      Is to be given  FYI:     Xarelto  Norco  DME:   Pt already has equipment  PT:   No PT   Patient Active Problem List   Diagnosis Date Noted  . Hematoma of arm 08/09/2012  . 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 History of  breast cancer in female 01/06/2011   Past Medical History:  Diagnosis Date  . Arthritis   . Asthma    controlled with singulair  . Breast cancer ([place])    stage I left breast  . GERD (gastroesophageal reflux disease)   . Hematoma of arm 08/09/2012  . 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 Hyperlipidemia   . Hypertension   . Ischemic colon ([place])    03/2008  . 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 Pneumonia    viral pneumonia as a kid  . PONV (postoperative nausea and vomiting)     Past Surgical History:  Procedure Laterality Date  . BREAST SURGERY     Left breast lumpectomy sentinel node biopsy  . CERVICAL SPINE SURGERY  05/23/2008  . COLECTOMY  04/11/2008   left colectomy with end colostomy due to clot  . COLOSTOMY TAKEDOWN  08/14/2008  . EYE SURGERY     bil cataracts  . HERNIA REPAIR  2011   [facility]  . LUMBAR DISC SURGERY  2006    No prescriptions prior to admission.   Allergies  Allergen Reactions  . Penicillins Hives    Has patient had a PCN reaction causing immediate rash, facial/tongue/throat swelling, SOB or lightheadedness with hypotension: Unknown Has patient had a PCN reaction causing severe rash involving mucus membranes or skin necrosis: Unknown Has patient had a PCN reaction that required hospitalization:  No Has patient had a PCN reaction occurring within the last 10 years: No If all of the above answers are "NO", then may proceed with Cephalosporin use.   . Sulfa Antibiotics Other (See Comments)    unknown    Social History  Substance Use Topics  . Smoking status: Former Smoker    Quit date: 01/06/1964  . Smokeless tobacco: Never Used  . Alcohol use 0.0 oz/week     Comment: once in a while    Family History  Problem Relation Age of Onset  . Breast cancer Mother 50  . Lung cancer Mother   . Heart disease Father   . Breast cancer Maternal Aunt 85  . Breast cancer Paternal Aunt      Review of  Systems  Constitutional: Negative.   HENT: Negative.   Eyes: Negative.   Respiratory: Negative.   Cardiovascular: Negative.   Gastrointestinal: Positive for heartburn.  Genitourinary: Negative.   Musculoskeletal: Positive for joint pain.  Skin: Negative.   Neurological: Negative.   Endo/Heme/Allergies: Negative.   Psychiatric/Behavioral: Negative.     Objective:  Physical Exam  Constitutional: She is oriented to person, place, and time. She appears well-developed.  HENT:  Head: Normocephalic.  Eyes: Pupils are equal, round, and reactive to light.  Neck: Neck supple. No JVD present. No tracheal deviation present. No thyromegaly present.  Cardiovascular: Normal rate, regular rhythm and intact distal pulses.   Respiratory: Effort normal and breath sounds normal. No respiratory distress. She has no wheezes.  GI: Soft. There is no tenderness. There is no guarding.  Musculoskeletal:       Left hip: She exhibits decreased range of motion, decreased strength, tenderness and bony tenderness. She exhibits no swelling, no deformity and no laceration.  Lymphadenopathy:    She has no cervical adenopathy.  Neurological: She is alert and oriented to person, place, and time. A sensory deficit (left LE numbness) is present.  Skin: Skin is warm and dry.  Psychiatric: She has a normal mood and affect.      Labs:  Estimated body mass index is 19.73 kg/m as calculated from the following:   Height as of this encounter: 5' (1.524 m).   Weight as of this encounter: 45.8 kg (101 lb).   Imaging Review Plain radiographs demonstrate severe degenerative joint disease of the left hip(s). The bone quality appears to be good for age and reported activity level.  Assessment/Plan:  End stage arthritis, left hip(s)  The patient history, physical examination, clinical judgement of the provider and imaging studies are consistent with end stage degenerative joint disease of the left hip(s) and total  hip arthroplasty is deemed medically necessary. The treatment options including medical management, injection therapy, arthroscopy and arthroplasty were discussed at length. The risks and benefits of total hip arthroplasty were presented and reviewed. The risks due to aseptic loosening, infection, stiffness, dislocation/subluxation,  thromboembolic complications and other imponderables were discussed.  The patient acknowledged the explanation, agreed to proceed with the plan and consent was signed. Patient is being admitted for inpatient treatment for surgery, pain control, PT, OT, prophylactic antibiotics, VTE prophylaxis, progressive ambulation and ADL's and discharge planning.The patient is planning to be discharged home.      West Pugh Aigner Horseman   PA-C  10/06/2016, 9:58 AM

## 2016-10-07 ENCOUNTER — Inpatient Hospital Stay (HOSPITAL_COMMUNITY): Payer: Medicare Other | Admitting: Certified Registered Nurse Anesthetist

## 2016-10-07 ENCOUNTER — Inpatient Hospital Stay (HOSPITAL_COMMUNITY): Payer: Medicare Other

## 2016-10-07 ENCOUNTER — Inpatient Hospital Stay (HOSPITAL_COMMUNITY)
Admission: RE | Admit: 2016-10-07 | Discharge: 2016-10-09 | DRG: 470 | Disposition: A | Discharge status: Home / Self Care | Payer: Medicare Other | Source: Ambulatory Visit | Attending: Orthopedic Surgery | Admitting: Orthopedic Surgery

## 2016-10-07 ENCOUNTER — Encounter (HOSPITAL_COMMUNITY): Payer: Self-pay

## 2016-10-07 ENCOUNTER — Encounter (HOSPITAL_COMMUNITY)
Admission: RE | Disposition: A | Discharge status: Home / Self Care | Payer: Self-pay | Source: Ambulatory Visit | Attending: Orthopedic Surgery

## 2016-10-07 DIAGNOSIS — Z87891 Personal history of nicotine dependence: Secondary | ICD-10-CM | POA: Diagnosis not present

## 2016-10-07 DIAGNOSIS — J45909 Unspecified asthma, uncomplicated: Secondary | ICD-10-CM | POA: Diagnosis present

## 2016-10-07 DIAGNOSIS — K219 Gastro-esophageal reflux disease without esophagitis: Secondary | ICD-10-CM | POA: Diagnosis present

## 2016-10-07 DIAGNOSIS — Z96642 Presence of left artificial hip joint: Secondary | ICD-10-CM | POA: Diagnosis not present

## 2016-10-07 DIAGNOSIS — C946 Myelodysplastic disease, not classified: Secondary | ICD-10-CM | POA: Diagnosis present

## 2016-10-07 DIAGNOSIS — Z8249 Family history of ischemic heart disease and other diseases of the circulatory system: Secondary | ICD-10-CM

## 2016-10-07 DIAGNOSIS — Z96649 Presence of unspecified artificial hip joint: Secondary | ICD-10-CM

## 2016-10-07 DIAGNOSIS — Z880 Allergy status to penicillin: Secondary | ICD-10-CM | POA: Diagnosis not present

## 2016-10-07 DIAGNOSIS — M1612 Unilateral primary osteoarthritis, left hip: Secondary | ICD-10-CM | POA: Diagnosis present

## 2016-10-07 DIAGNOSIS — Z853 Personal history of malignant neoplasm of breast: Secondary | ICD-10-CM | POA: Diagnosis not present

## 2016-10-07 DIAGNOSIS — Z801 Family history of malignant neoplasm of trachea, bronchus and lung: Secondary | ICD-10-CM

## 2016-10-07 DIAGNOSIS — Z803 Family history of malignant neoplasm of breast: Secondary | ICD-10-CM

## 2016-10-07 DIAGNOSIS — E785 Hyperlipidemia, unspecified: Secondary | ICD-10-CM | POA: Diagnosis present

## 2016-10-07 DIAGNOSIS — I10 Essential (primary) hypertension: Secondary | ICD-10-CM | POA: Diagnosis present

## 2016-10-07 DIAGNOSIS — Z471 Aftercare following joint replacement surgery: Secondary | ICD-10-CM | POA: Diagnosis not present

## 2016-10-07 HISTORY — PX: TOTAL HIP ARTHROPLASTY: SHX124

## 2016-10-07 LAB — TYPE AND SCREEN
ABO/RH(D): O POS
Antibody Screen: NEGATIVE

## 2016-10-07 SURGERY — ARTHROPLASTY, HIP, TOTAL, ANTERIOR APPROACH
Anesthesia: General | Site: Hip | Laterality: Left

## 2016-10-07 MED ORDER — PROPOFOL 10 MG/ML IV BOLUS
INTRAVENOUS | Status: AC
  Filled 2016-10-07: qty 20

## 2016-10-07 MED ORDER — NON FORMULARY: 40.0000 mg | Freq: Every day | Status: DC

## 2016-10-07 MED ORDER — FENTANYL CITRATE (PF) 100 MCG/2ML IJ SOLN
25.0000 ug | INTRAMUSCULAR | Status: DC | PRN
  Administered 2016-10-07 (×2): 25 ug via INTRAVENOUS
  Filled 2016-10-07 (×2): qty 2

## 2016-10-07 MED ORDER — METHOCARBAMOL 500 MG PO TABS: 500.0000 mg | ORAL_TABLET | Freq: Four times a day (QID) | ORAL | 0 refills | Status: DC | PRN

## 2016-10-07 MED ORDER — SODIUM CHLORIDE 0.9 % IV SOLN
INTRAVENOUS | Status: DC
  Administered 2016-10-07 – 2016-10-08 (×3): via INTRAVENOUS

## 2016-10-07 MED ORDER — FENTANYL CITRATE (PF) 100 MCG/2ML IJ SOLN
INTRAMUSCULAR | Status: AC
  Filled 2016-10-07: qty 2

## 2016-10-07 MED ORDER — DEXAMETHASONE SODIUM PHOSPHATE 10 MG/ML IJ SOLN
10.0000 mg | Freq: Once | INTRAMUSCULAR | Status: AC
  Administered 2016-10-07: 10 mg via INTRAVENOUS

## 2016-10-07 MED ORDER — ONDANSETRON HCL 4 MG PO TABS
4.0000 mg | ORAL_TABLET | Freq: Four times a day (QID) | ORAL | Status: DC | PRN
  Administered 2016-10-09 (×2): 4 mg via ORAL
  Filled 2016-10-07 (×2): qty 1

## 2016-10-07 MED ORDER — STERILE WATER FOR IRRIGATION IR SOLN
Status: DC | PRN
  Administered 2016-10-07: 2000 mL

## 2016-10-07 MED ORDER — ALUM & MAG HYDROXIDE-SIMETH 200-200-20 MG/5ML PO SUSP
15.0000 mL | ORAL | Status: DC | PRN
  Administered 2016-10-08: 09:00:00 15 mL via ORAL
  Filled 2016-10-07: qty 30

## 2016-10-07 MED ORDER — POLYETHYLENE GLYCOL 3350 17 G PO PACK: 17.0000 g | PACK | Freq: Two times a day (BID) | ORAL | 0 refills | Status: DC

## 2016-10-07 MED ORDER — POLYETHYLENE GLYCOL 3350 17 G PO PACK
17.0000 g | PACK | Freq: Two times a day (BID) | ORAL | Status: DC
  Administered 2016-10-08: 10:00:00 17 g via ORAL
  Filled 2016-10-07 (×2): qty 1

## 2016-10-07 MED ORDER — CHLORHEXIDINE GLUCONATE 4 % EX LIQD: 60.0000 mL | Freq: Once | CUTANEOUS | Status: DC

## 2016-10-07 MED ORDER — ANASTROZOLE 1 MG PO TABS
1.0000 mg | ORAL_TABLET | Freq: Every day | ORAL | Status: DC
  Administered 2016-10-08 – 2016-10-09 (×2): 1 mg via ORAL
  Filled 2016-10-07 (×2): qty 1

## 2016-10-07 MED ORDER — SODIUM CHLORIDE 0.9 % IR SOLN
Status: DC | PRN
  Administered 2016-10-07: 1000 mL

## 2016-10-07 MED ORDER — SIMVASTATIN 20 MG PO TABS
20.0000 mg | ORAL_TABLET | Freq: Every day | ORAL | Status: DC
  Administered 2016-10-07 – 2016-10-08 (×2): 20 mg via ORAL
  Filled 2016-10-07 (×2): qty 1

## 2016-10-07 MED ORDER — LIDOCAINE 5 % EX PTCH
1.0000 | MEDICATED_PATCH | CUTANEOUS | Status: DC
  Administered 2016-10-08: 22:00:00 1 via TRANSDERMAL
  Filled 2016-10-07 (×3): qty 1

## 2016-10-07 MED ORDER — MONTELUKAST SODIUM 10 MG PO TABS
10.0000 mg | ORAL_TABLET | Freq: Every day | ORAL | Status: DC
  Administered 2016-10-07 – 2016-10-08 (×2): 10 mg via ORAL
  Filled 2016-10-07 (×3): qty 1

## 2016-10-07 MED ORDER — FERROUS SULFATE 325 (65 FE) MG PO TABS: 325.0000 mg | ORAL_TABLET | Freq: Three times a day (TID) | ORAL | Status: DC

## 2016-10-07 MED ORDER — PROPOFOL 10 MG/ML IV BOLUS
INTRAVENOUS | Status: AC
  Filled 2016-10-07: qty 60

## 2016-10-07 MED ORDER — PHENYLEPHRINE 40 MCG/ML (10ML) SYRINGE FOR IV PUSH (FOR BLOOD PRESSURE SUPPORT)
PREFILLED_SYRINGE | INTRAVENOUS | Status: AC
  Filled 2016-10-07: qty 10

## 2016-10-07 MED ORDER — DOCUSATE SODIUM 100 MG PO CAPS: 100.0000 mg | ORAL_CAPSULE | Freq: Two times a day (BID) | ORAL | 0 refills | Status: DC

## 2016-10-07 MED ORDER — ESOMEPRAZOLE MAGNESIUM 40 MG PO CPDR
40.0000 mg | DELAYED_RELEASE_CAPSULE | Freq: Every day | ORAL | Status: DC
  Administered 2016-10-08 – 2016-10-09 (×2): 40 mg via ORAL
  Filled 2016-10-07 (×2): qty 1

## 2016-10-07 MED ORDER — PROMETHAZINE HCL 25 MG/ML IJ SOLN
6.2500 mg | INTRAMUSCULAR | Status: DC | PRN
  Administered 2016-10-07: 6.25 mg via INTRAVENOUS

## 2016-10-07 MED ORDER — LIDOCAINE 2% (20 MG/ML) 5 ML SYRINGE
INTRAMUSCULAR | Status: DC | PRN
  Administered 2016-10-07: 50 mg via INTRAVENOUS

## 2016-10-07 MED ORDER — HYDROMORPHONE HCL-NACL 0.5-0.9 MG/ML-% IV SOSY
PREFILLED_SYRINGE | INTRAVENOUS | Status: AC
  Filled 2016-10-07: qty 2

## 2016-10-07 MED ORDER — FENTANYL CITRATE (PF) 250 MCG/5ML IJ SOLN
INTRAMUSCULAR | Status: DC | PRN
Start: 2016-10-07 — End: 2016-10-07
  Administered 2016-10-07 (×4): 50 ug via INTRAVENOUS

## 2016-10-07 MED ORDER — METHOCARBAMOL 500 MG PO TABS
500.0000 mg | ORAL_TABLET | Freq: Four times a day (QID) | ORAL | Status: DC | PRN
  Administered 2016-10-07: 21:00:00 500 mg via ORAL
  Filled 2016-10-07: qty 1

## 2016-10-07 MED ORDER — BISACODYL 10 MG RE SUPP: 10.0000 mg | Freq: Every day | RECTAL | Status: DC | PRN

## 2016-10-07 MED ORDER — ROCURONIUM BROMIDE 10 MG/ML (PF) SYRINGE
PREFILLED_SYRINGE | INTRAVENOUS | Status: DC | PRN
  Administered 2016-10-07: 30 mg via INTRAVENOUS

## 2016-10-07 MED ORDER — VANCOMYCIN HCL IN DEXTROSE 1-5 GM/200ML-% IV SOLN
1000.0000 mg | Freq: Two times a day (BID) | INTRAVENOUS | Status: AC
  Administered 2016-10-08: 1000 mg via INTRAVENOUS
  Filled 2016-10-07: qty 200

## 2016-10-07 MED ORDER — HYDROCODONE-ACETAMINOPHEN 7.5-325 MG PO TABS: 1.0000 | ORAL_TABLET | ORAL | 0 refills | Status: DC | PRN

## 2016-10-07 MED ORDER — PROMETHAZINE HCL 25 MG/ML IJ SOLN
INTRAMUSCULAR | Status: AC
  Filled 2016-10-07: qty 1

## 2016-10-07 MED ORDER — DEXAMETHASONE SODIUM PHOSPHATE 10 MG/ML IJ SOLN
10.0000 mg | Freq: Once | INTRAMUSCULAR | Status: AC
  Administered 2016-10-08: 10 mg via INTRAVENOUS
  Filled 2016-10-07: qty 1

## 2016-10-07 MED ORDER — MAGNESIUM CITRATE PO SOLN: 1.0000 | Freq: Once | ORAL | Status: DC | PRN

## 2016-10-07 MED ORDER — VANCOMYCIN HCL IN DEXTROSE 1-5 GM/200ML-% IV SOLN
INTRAVENOUS | Status: AC
  Filled 2016-10-07: qty 200

## 2016-10-07 MED ORDER — ONDANSETRON HCL 4 MG/2ML IJ SOLN
INTRAMUSCULAR | Status: AC
  Filled 2016-10-07: qty 2

## 2016-10-07 MED ORDER — RIVAROXABAN 10 MG PO TABS
20.0000 mg | ORAL_TABLET | ORAL | Status: DC
  Administered 2016-10-08 – 2016-10-09 (×2): 20 mg via ORAL
  Filled 2016-10-07: qty 1
  Filled 2016-10-07 (×2): qty 2

## 2016-10-07 MED ORDER — HYDROCODONE-ACETAMINOPHEN 7.5-325 MG PO TABS
1.0000 | ORAL_TABLET | ORAL | Status: DC
  Administered 2016-10-07 (×2): 2 via ORAL
  Administered 2016-10-07: 1 via ORAL
  Administered 2016-10-08 (×3): 2 via ORAL
  Administered 2016-10-08: 05:00:00 1 via ORAL
  Administered 2016-10-08 – 2016-10-09 (×3): 2 via ORAL
  Filled 2016-10-07 (×3): qty 2
  Filled 2016-10-07: qty 1
  Filled 2016-10-07 (×7): qty 2
  Filled 2016-10-07: qty 1

## 2016-10-07 MED ORDER — LIDOCAINE 2% (20 MG/ML) 5 ML SYRINGE
INTRAMUSCULAR | Status: AC
  Filled 2016-10-07: qty 5

## 2016-10-07 MED ORDER — LACTATED RINGERS IV SOLN
INTRAVENOUS | Status: DC
  Administered 2016-10-07: 13:00:00 via INTRAVENOUS
  Administered 2016-10-07: 1000 mL via INTRAVENOUS

## 2016-10-07 MED ORDER — METHOCARBAMOL 1000 MG/10ML IJ SOLN
500.0000 mg | Freq: Four times a day (QID) | INTRAMUSCULAR | Status: DC | PRN
Start: 2016-10-07 — End: 2016-10-09
  Administered 2016-10-07: 500 mg via INTRAVENOUS
  Filled 2016-10-07: qty 550

## 2016-10-07 MED ORDER — ROCURONIUM BROMIDE 50 MG/5ML IV SOSY
PREFILLED_SYRINGE | INTRAVENOUS | Status: AC
  Filled 2016-10-07: qty 5

## 2016-10-07 MED ORDER — VANCOMYCIN HCL IN DEXTROSE 1-5 GM/200ML-% IV SOLN
1000.0000 mg | INTRAVENOUS | Status: AC
  Administered 2016-10-07: 1000 mg via INTRAVENOUS

## 2016-10-07 MED ORDER — FERROUS SULFATE 325 (65 FE) MG PO TABS
325.0000 mg | ORAL_TABLET | Freq: Three times a day (TID) | ORAL | Status: DC
  Filled 2016-10-07: qty 1

## 2016-10-07 MED ORDER — TRIAMTERENE-HCTZ 37.5-25 MG PO TABS
1.0000 | ORAL_TABLET | Freq: Every day | ORAL | Status: DC
  Filled 2016-10-07: qty 1

## 2016-10-07 MED ORDER — MENTHOL 3 MG MT LOZG: 1.0000 | LOZENGE | OROMUCOSAL | Status: DC | PRN

## 2016-10-07 MED ORDER — METOCLOPRAMIDE HCL 5 MG/ML IJ SOLN: 5.0000 mg | Freq: Three times a day (TID) | INTRAMUSCULAR | Status: DC | PRN

## 2016-10-07 MED ORDER — LOSARTAN POTASSIUM 50 MG PO TABS
100.0000 mg | ORAL_TABLET | Freq: Every day | ORAL | Status: DC
  Filled 2016-10-07: qty 2

## 2016-10-07 MED ORDER — ONDANSETRON HCL 4 MG/2ML IJ SOLN
4.0000 mg | Freq: Four times a day (QID) | INTRAMUSCULAR | Status: DC | PRN
  Administered 2016-10-07: 4 mg via INTRAVENOUS
  Filled 2016-10-07: qty 2

## 2016-10-07 MED ORDER — SUGAMMADEX SODIUM 200 MG/2ML IV SOLN
INTRAVENOUS | Status: DC | PRN
  Administered 2016-10-07: 100 mg via INTRAVENOUS

## 2016-10-07 MED ORDER — HYDROMORPHONE HCL-NACL 0.5-0.9 MG/ML-% IV SOSY
0.2500 mg | PREFILLED_SYRINGE | INTRAVENOUS | Status: DC | PRN
  Administered 2016-10-07 (×2): 0.5 mg via INTRAVENOUS

## 2016-10-07 MED ORDER — PHENOL 1.4 % MT LIQD: 1.0000 | OROMUCOSAL | Status: DC | PRN

## 2016-10-07 MED ORDER — DIPHENHYDRAMINE HCL 25 MG PO CAPS: 25.0000 mg | ORAL_CAPSULE | Freq: Four times a day (QID) | ORAL | Status: DC | PRN

## 2016-10-07 MED ORDER — METOCLOPRAMIDE HCL 5 MG PO TABS
5.0000 mg | ORAL_TABLET | Freq: Three times a day (TID) | ORAL | Status: DC | PRN
  Administered 2016-10-07: 5 mg via ORAL
  Filled 2016-10-07: qty 1

## 2016-10-07 MED ORDER — ONDANSETRON HCL 4 MG/2ML IJ SOLN
INTRAMUSCULAR | Status: DC | PRN
  Administered 2016-10-07: 4 mg via INTRAVENOUS

## 2016-10-07 MED ORDER — SUCCINYLCHOLINE CHLORIDE 200 MG/10ML IV SOSY
PREFILLED_SYRINGE | INTRAVENOUS | Status: DC | PRN
  Administered 2016-10-07: 80 mg via INTRAVENOUS

## 2016-10-07 MED ORDER — DOCUSATE SODIUM 100 MG PO CAPS
100.0000 mg | ORAL_CAPSULE | Freq: Two times a day (BID) | ORAL | Status: DC
  Administered 2016-10-07 – 2016-10-09 (×4): 100 mg via ORAL
  Filled 2016-10-07 (×4): qty 1

## 2016-10-07 MED ORDER — PROPOFOL 10 MG/ML IV BOLUS
INTRAVENOUS | Status: DC | PRN
  Administered 2016-10-07: 90 mg via INTRAVENOUS

## 2016-10-07 MED ORDER — PHENYLEPHRINE HCL 10 MG/ML IJ SOLN
INTRAMUSCULAR | Status: DC | PRN
  Administered 2016-10-07 (×2): 80 ug via INTRAVENOUS

## 2016-10-07 SURGICAL SUPPLY — 38 items
ADH SKN CLS APL DERMABOND .7 (GAUZE/BANDAGES/DRESSINGS) ×1
BAG SPEC THK2 15X12 ZIP CLS (MISCELLANEOUS) ×1
BAG ZIPLOCK 12X15 (MISCELLANEOUS) ×1 IMPLANT
BLADE SAG 18X100X1.27 (BLADE) ×2 IMPLANT
CAPT HIP TOTAL 2 ×1 IMPLANT
CLOTH BEACON ORANGE TIMEOUT ST (SAFETY) ×2 IMPLANT
COVER PERINEAL POST (MISCELLANEOUS) ×2 IMPLANT
COVER SURGICAL LIGHT HANDLE (MISCELLANEOUS) ×2 IMPLANT
DERMABOND ADVANCED (GAUZE/BANDAGES/DRESSINGS) ×1
DERMABOND ADVANCED .7 DNX12 (GAUZE/BANDAGES/DRESSINGS) ×1 IMPLANT
DRAPE STERI IOBAN 125X83 (DRAPES) ×2 IMPLANT
DRAPE U-SHAPE 47X51 STRL (DRAPES) ×4 IMPLANT
DRESSING AQUACEL AG SP 3.5X10 (GAUZE/BANDAGES/DRESSINGS) ×1 IMPLANT
DRSG AQUACEL AG SP 3.5X10 (GAUZE/BANDAGES/DRESSINGS) ×2
DURAPREP 26ML APPLICATOR (WOUND CARE) ×2 IMPLANT
ELECT REM PT RETURN 15FT ADLT (MISCELLANEOUS) ×2 IMPLANT
GLOVE BIOGEL M STRL SZ7.5 (GLOVE) ×2 IMPLANT
GLOVE BIOGEL PI IND STRL 7.5 (GLOVE) ×1 IMPLANT
GLOVE BIOGEL PI IND STRL 8.5 (GLOVE) ×1 IMPLANT
GLOVE BIOGEL PI INDICATOR 7.5 (GLOVE) ×6
GLOVE BIOGEL PI INDICATOR 8.5 (GLOVE) ×1
GLOVE ORTHO TXT STRL SZ7.5 (GLOVE) ×2 IMPLANT
GLOVE SURG SS PI 7.0 STRL IVOR (GLOVE) ×1 IMPLANT
GLOVE SURG SS PI 7.5 STRL IVOR (GLOVE) ×1 IMPLANT
GOWN STRL REUS W/ TWL XL LVL3 (GOWN DISPOSABLE) IMPLANT
GOWN STRL REUS W/TWL LRG LVL3 (GOWN DISPOSABLE) ×2 IMPLANT
GOWN STRL REUS W/TWL XL LVL3 (GOWN DISPOSABLE) ×5 IMPLANT
HOLDER FOLEY CATH W/STRAP (MISCELLANEOUS) ×2 IMPLANT
PACK ANTERIOR HIP CUSTOM (KITS) ×2 IMPLANT
SUT MNCRL AB 4-0 PS2 18 (SUTURE) ×2 IMPLANT
SUT STRATAFIX 0 PDS 27 VIOLET (SUTURE) ×2
SUT VIC AB 1 CT1 36 (SUTURE) ×6 IMPLANT
SUT VIC AB 2-0 CT1 27 (SUTURE) ×4
SUT VIC AB 2-0 CT1 TAPERPNT 27 (SUTURE) ×2 IMPLANT
SUTURE STRATFX 0 PDS 27 VIOLET (SUTURE) ×1 IMPLANT
TRAY FOLEY CATH SILVER 14FR (SET/KITS/TRAYS/PACK) ×1 IMPLANT
TRAY FOLEY W/METER SILVER 16FR (SET/KITS/TRAYS/PACK) IMPLANT
YANKAUER SUCT BULB TIP 10FT TU (MISCELLANEOUS) ×1 IMPLANT

## 2016-10-07 NOTE — Discharge Instructions (Addendum)
Information on my medicine - XARELTO® (Rivaroxaban) ° °This medication education was reviewed with me or my healthcare representative as part of my discharge preparation.  °Why was Xarelto® prescribed for you? °Xarelto® was prescribed for you to reduce the risk of blood clots forming after orthopedic surgery. The medical term for these abnormal blood clots is venous thromboembolism (VTE). ° °What do you need to know about xarelto® ? °Take your Xarelto® ONCE DAILY at the same time every day. °You may take it either with or without food. ° °If you have difficulty swallowing the tablet whole, you may crush it and mix in applesauce just prior to taking your dose. ° °Take Xarelto® exactly as prescribed by your doctor and DO NOT stop taking Xarelto® without talking to the doctor who prescribed the medication.  Stopping without other VTE prevention medication to take the place of Xarelto® may increase your risk of developing a clot. ° °After discharge, you should have regular check-up appointments with your healthcare provider that is prescribing your Xarelto®.   ° °What do you do if you miss a dose? °If you miss a dose, take it as soon as you remember on the same day then continue your regularly scheduled once daily regimen the next day. Do not take two doses of Xarelto® on the same day.  ° °Important Safety Information °A possible side effect of Xarelto® is bleeding. You should call your healthcare provider right away if you experience any of the following: °? Bleeding from an injury or your nose that does not stop. °? Unusual colored urine (red or dark brown) or unusual colored stools (red or black). °? Unusual bruising for unknown reasons. °? A serious fall or if you hit your head (even if there is no bleeding). ° °Some medicines may interact with Xarelto® and might increase your risk of bleeding while on Xarelto®. To help avoid this, consult your healthcare provider or pharmacist prior to using any new prescription or  non-prescription medications, including herbals, vitamins, non-steroidal anti-inflammatory drugs (NSAIDs) and supplements. ° °This website has more information on Xarelto®: www.xarelto.com. ° °INSTRUCTIONS AFTER JOINT REPLACEMENT  ° °o Remove items at home which could result in a fall. This includes throw rugs or furniture in walking pathways °o ICE to the affected joint every three hours while awake for 30 minutes at a time, for at least the first 3-5 days, and then as needed for pain and swelling.  Continue to use ice for pain and swelling. You may notice swelling that will progress down to the foot and ankle.  This is normal after surgery.  Elevate your leg when you are not up walking on it.   °o Continue to use the breathing machine you got in the hospital (incentive spirometer) which will help keep your temperature down.  It is common for your temperature to cycle up and down following surgery, especially at night when you are not up moving around and exerting yourself.  The breathing machine keeps your lungs expanded and your temperature down. ° ° °DIET:  As you were doing prior to hospitalization, we recommend a well-balanced diet. ° °DRESSING / WOUND CARE / SHOWERING ° °Keep the surgical dressing until follow up.  The dressing is water proof, so you can shower without any extra covering.  IF THE DRESSING FALLS OFF or the wound gets wet inside, change the dressing with sterile gauze.  Please use good hand washing techniques before changing the dressing.  Do not use any lotions or   creams on the incision until instructed by your surgeon.   ° °ACTIVITY ° °o Increase activity slowly as tolerated, but follow the weight bearing instructions below.   °o No driving for 6 weeks or until further direction given by your physician.  You cannot drive while taking narcotics.  °o No lifting or carrying greater than 10 lbs. until further directed by your surgeon. °o Avoid periods of inactivity such as sitting longer than an  hour when not asleep. This helps prevent blood clots.  °o You may return to work once you are authorized by your doctor.  ° ° ° °WEIGHT BEARING  ° °Weight bearing as tolerated with assist device (walker, cane, etc) as directed, use it as long as suggested by your surgeon or therapist, typically at least 4-6 weeks. ° ° °EXERCISES ° °Results after joint replacement surgery are often greatly improved when you follow the exercise, range of motion and muscle strengthening exercises prescribed by your doctor. Safety measures are also important to protect the joint from further injury. Any time any of these exercises cause you to have increased pain or swelling, decrease what you are doing until you are comfortable again and then slowly increase them. If you have problems or questions, call your caregiver or physical therapist for advice.  ° °Rehabilitation is important following a joint replacement. After just a few days of immobilization, the muscles of the leg can become weakened and shrink (atrophy).  These exercises are designed to build up the tone and strength of the thigh and leg muscles and to improve motion. Often times heat used for twenty to thirty minutes before working out will loosen up your tissues and help with improving the range of motion but do not use heat for the first two weeks following surgery (sometimes heat can increase post-operative swelling).  ° °These exercises can be done on a training (exercise) mat, on the floor, on a table or on a bed. Use whatever works the best and is most comfortable for you.    Use music or television while you are exercising so that the exercises are a pleasant break in your day. This will make your life better with the exercises acting as a break in your routine that you can look forward to.   Perform all exercises about fifteen times, three times per day or as directed.  You should exercise both the operative leg and the other leg as well. ° °Exercises include: °    °• Quad Sets - Tighten up the muscle on the front of the thigh (Quad) and hold for 5-10 seconds.   °• Straight Leg Raises - With your knee straight (if you were given a brace, keep it on), lift the leg to 60 degrees, hold for 3 seconds, and slowly lower the leg.  Perform this exercise against resistance later as your leg gets stronger.  °• Leg Slides: Lying on your back, slowly slide your foot toward your buttocks, bending your knee up off the floor (only go as far as is comfortable). Then slowly slide your foot back down until your leg is flat on the floor again.  °• Angel Wings: Lying on your back spread your legs to the side as far apart as you can without causing discomfort.  °• Hamstring Strength:  Lying on your back, push your heel against the floor with your leg straight by tightening up the muscles of your buttocks.  Repeat, but this time bend your knee to a comfortable angle,   and push your heel against the floor.  You may put a pillow under the heel to make it more comfortable if necessary.  ° °A rehabilitation program following joint replacement surgery can speed recovery and prevent re-injury in the future due to weakened muscles. Contact your doctor or a physical therapist for more information on knee rehabilitation.  ° ° °CONSTIPATION ° °Constipation is defined medically as fewer than three stools per week and severe constipation as less than one stool per week.  Even if you have a regular bowel pattern at home, your normal regimen is likely to be disrupted due to multiple reasons following surgery.  Combination of anesthesia, postoperative narcotics, change in appetite and fluid intake all can affect your bowels.  ° °YOU MUST use at least one of the following options; they are listed in order of increasing strength to get the job done.  They are all available over the counter, and you may need to use some, POSSIBLY even all of these options:   ° °Drink plenty of fluids (prune juice may be helpful) and  high fiber foods °Colace 100 mg by mouth twice a day  °Senokot for constipation as directed and as needed Dulcolax (bisacodyl), take with full glass of water  °Miralax (polyethylene glycol) once or twice a day as needed. ° °If you have tried all these things and are unable to have a bowel movement in the first 3-4 days after surgery call either your surgeon or your primary doctor.   ° °If you experience loose stools or diarrhea, hold the medications until you stool forms back up.  If your symptoms do not get better within 1 week or if they get worse, check with your doctor.  If you experience "the worst abdominal pain ever" or develop nausea or vomiting, please contact the office immediately for further recommendations for treatment. ° ° °ITCHING:  If you experience itching with your medications, try taking only a single pain pill, or even half a pain pill at a time.  You can also use Benadryl over the counter for itching or also to help with sleep.  ° °TED HOSE STOCKINGS:  Use stockings on both legs until for at least 2 weeks or as directed by physician office. They may be removed at night for sleeping. ° °MEDICATIONS:  See your medication summary on the “After Visit Summary” that nursing will review with you.  You may have some home medications which will be placed on hold until you complete the course of blood thinner medication.  It is important for you to complete the blood thinner medication as prescribed. ° °PRECAUTIONS:  If you experience chest pain or shortness of breath - call 911 immediately for transfer to the hospital emergency department.  ° °If you develop a fever greater that 101 F, purulent drainage from wound, increased redness or drainage from wound, foul odor from the wound/dressing, or calf pain - CONTACT YOUR SURGEON.   °                                                °FOLLOW-UP APPOINTMENTS:  If you do not already have a post-op appointment, please call the office for an appointment to be seen  by your surgeon.  Guidelines for how soon to be seen are listed in your “After Visit Summary”, but are typically between 1-4   weeks after surgery. ° °OTHER INSTRUCTIONS:  ° °Knee Replacement:  Do not place pillow under knee, focus on keeping the knee straight while resting.  ° °MAKE SURE YOU:  °• Understand these instructions.  °• Get help right away if you are not doing well or get worse.  ° ° °Thank you for letting us be a part of your medical care team.  It is a privilege we respect greatly.  We hope these instructions will help you stay on track for a fast and full recovery!  °  °

## 2016-10-07 NOTE — Anesthesia Procedure Notes (Signed)
Procedure Name: Intubation Date/Time: 10/07/2016 11:41 AM Performed by: Montel Clock Pre-anesthesia Checklist: Patient identified, Emergency Drugs available, Suction available, Patient being monitored and Timeout performed Patient Re-evaluated:Patient Re-evaluated prior to inductionOxygen Delivery Method: Circle system utilized Preoxygenation: Pre-oxygenation with 100% oxygen Intubation Type: IV induction Ventilation: Mask ventilation without difficulty Laryngoscope Size: Mac and 3 Grade View: Grade I Tube type: Oral Tube size: 7.0 mm Number of attempts: 1 Airway Equipment and Method: Stylet Placement Confirmation: ETT inserted through vocal cords under direct vision,  positive ETCO2 and breath sounds checked- equal and bilateral Secured at: 21 cm Tube secured with: Tape Dental Injury: Teeth and Oropharynx as per pre-operative assessment

## 2016-10-07 NOTE — Transfer of Care (Signed)
Immediate Anesthesia Transfer of Care Note  Patient: Gwendolyn Williams  Procedure(s) Performed: Procedure(s) with comments: LEFT TOTAL HIP ARTHROPLASTY ANTERIOR APPROACH (Left) - 70 mins  Patient Location: PACU  Anesthesia Type:General  Level of Consciousness:  sedated, patient cooperative and responds to stimulation  Airway & Oxygen Therapy:Patient Spontanous Breathing and Patient connected to face mask oxgen  Post-op Assessment:  Report given to PACU RN and Post -op Vital signs reviewed and stable  Post vital signs:  Reviewed and stable  Last Vitals:  Vitals:   10/03/16 1049 10/07/16 0945  BP: 115/62 131/81  Pulse: 75 65  Resp: 16 16  Temp: 36.8 C 10.0 C    Complications: No apparent anesthesia complications

## 2016-10-07 NOTE — Op Note (Signed)
NAME:  Gwendolyn Williams                ACCOUNT NO.: [id number]      MEDICAL RECORD NO.: [id number]      FACILITY:  [facility]      PHYSICIAN:  Paralee Cancel D  DATE OF BIRTH:  04-28-1940     DATE OF PROCEDURE:  10/07/2016                                 OPERATIVE REPORT         PREOPERATIVE DIAGNOSIS: Left  hip osteoarthritis.      POSTOPERATIVE DIAGNOSIS:  Left hip osteoarthritis.      PROCEDURE:  Left total hip replacement through an anterior approach   utilizing DePuy THR system, component size 73mm pinnacle cup, a size 32+4 neutral   Altrex liner, a size 0 Hi Tri Lock stem with a 32+5 delta ceramic   ball.      SURGEON:  Pietro Cassis. Alvan Dame, M.D.      ASSISTANT:  Nehemiah Massed, PA-C     ANESTHESIA:  General.      SPECIMENS:  None.      COMPLICATIONS:  None.      BLOOD LOSS:  450 cc     DRAINS:  None.      INDICATION OF THE PROCEDURE:  Gwendolyn Williams is a 76 y.o. female who had   presented to office for evaluation of left hip pain.  Radiographs revealed   progressive degenerative changes with bone-on-bone   articulation to the  hip joint.  The patient had painful limited range of   motion significantly affecting their overall quality of life.  The patient was failing to    respond to conservative measures, and at this point was ready   to proceed with more definitive measures.  The patient has noted progressive   degenerative changes in his hip, progressive problems and dysfunction   with regarding the hip prior to surgery.  Consent was obtained for   benefit of pain relief.  Specific risk of infection, DVT, component   failure, dislocation, need for revision surgery, as well discussion of   the anterior versus posterior approach were reviewed.  Consent was   obtained for benefit of anterior pain relief through an anterior   approach.      PROCEDURE IN DETAIL:  The patient was brought to operative theater.   Once adequate anesthesia, preoperative  antibiotics, 1 gm of Vancomycin, 10 mg of Decadron (no TXA due to coagulation issues) administered.   The patient was positioned supine on the OSI Hanna table.  Once adequate   padding of boney process was carried out, we had predraped out the hip, and  used fluoroscopy to confirm orientation of the pelvis and position.      The left hip was then prepped and draped from proximal iliac crest to   mid thigh with shower curtain technique.      Time-out was performed identifying the patient, planned procedure, and   extremity.     An incision was then made 2 cm distal and lateral to the   anterior superior iliac spine extending over the orientation of the   tensor fascia lata muscle and sharp dissection was carried down to the   fascia of the muscle and protractor placed in the soft tissues.      The fascia  was then incised.  The muscle belly was identified and swept   laterally and retractor placed along the superior neck.  Following   cauterization of the circumflex vessels and removing some pericapsular   fat, a second cobra retractor was placed on the inferior neck.  A third   retractor was placed on the anterior acetabulum after elevating the   anterior rectus.  A L-capsulotomy was along the line of the   superior neck to the trochanteric fossa, then extended proximally and   distally.  Tag sutures were placed and the retractors were then placed   intracapsular.  We then identified the trochanteric fossa and   orientation of my neck cut, confirmed this radiographically   and then made a neck osteotomy with the femur on traction.  The femoral   head was removed without difficulty or complication.  Traction was let   off and retractors were placed posterior and anterior around the   acetabulum.      The labrum and foveal tissue were debrided.  I began reaming with a 51mm   reamer and reamed up to 25mm reamer with good bony bed preparation and a 57mm   cup was chosen.  The final 18mm  Pinnacle cup was then impacted under fluoroscopy  to confirm the depth of penetration and orientation with respect to   abduction.  A screw was placed followed by the hole eliminator.  The final   32+4 neutral Altrex liner was impacted with good visualized rim fit.  The cup was positioned anatomically within the acetabular portion of the pelvis.      At this point, the femur was rolled at 80 degrees.  Further capsule was   released off the inferior aspect of the femoral neck.  I then   released the superior capsule proximally.  The hook was placed laterally   along the femur and elevated manually and held in position with the bed   hook.  The leg was then extended and adducted with the leg rolled to 100   degrees of external rotation.  Once the proximal femur was fully   exposed, I used a box osteotome to set orientation.  I then began   broaching with the starting chili pepper broach and passed this by hand and then broached up to 0.  With the 0 broach in place I chose a high offset neck (due to soft tissue tension) and did several trial reductions.  The offset was appropriate, leg lengths   appeared to be equal best matched with the +5 head ball, in addition to providing appropriate soft tissue tension,  confirmed radiographically.   Given these findings, I went ahead and dislocated the hip, repositioned all   retractors and positioned the right hip in the extended and abducted position.  The final 0 hi Tri Lock stem was   chosen and it was impacted down to the level of neck cut.  Based on this   and the trial reduction, a 32+5 delta ceramic ball was chosen and   impacted onto a clean and dry trunnion, and the hip was reduced.  The   hip had been irrigated throughout the case again at this point.  I did   reapproximate the superior capsular leaflet to the anterior leaflet   using #1 Vicryl.  The fascia of the   tensor fascia lata muscle was then reapproximated using #1 Vicryl and #0 V-lock  sutures.  The   remaining wound was closed  with 2-0 Vicryl and running 4-0 Monocryl.   The hip was cleaned, dried, and dressed sterilely using Dermabond and   Aquacel dressing.  She was then brought   to recovery room in stable condition tolerating the procedure well.    Nehemiah Massed, PA-C was present for the entirety of the case involved from   preoperative positioning, perioperative retractor management, general   facilitation of the case, as well as primary wound closure as assistant.            Pietro Cassis Alvan Dame, M.D.        10/07/2016 12:54 PM

## 2016-10-07 NOTE — Anesthesia Preprocedure Evaluation (Addendum)
Anesthesia Evaluation  Patient identified by MRN, date of birth, ID band  Reviewed: Allergy & Precautions, NPO status , Patient's Chart, lab work & pertinent test results  History of Anesthesia Complications (+) PONV  Airway Mallampati: II  TM Distance: >3 FB Neck ROM: Full    Dental  (+) Dental Advisory Given, Chipped Bridge right lower--- crown with chip:   Pulmonary asthma , former smoker,    breath sounds clear to auscultation       Cardiovascular hypertension, Pt. 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   Anesthesia Other Findings   Reproductive/Obstetrics                           Lab Results  Component Value Date   WBC 12.5 (H) 10/03/2016   HGB 14.7 10/03/2016   HCT 45.0 10/03/2016   MCV 82.3 10/03/2016   PLT 589 (H) 10/03/2016   Lab Results  Component Value Date   CREATININE 1.13 (H) 10/03/2016   BUN 37 (H) 10/03/2016   NA 139 10/03/2016   K 3.9 10/03/2016   CL 104 10/03/2016   CO2 27 10/03/2016    Anesthesia Physical Anesthesia Plan  ASA: III  Anesthesia Plan: General   Post-op Pain Management:    Induction: Intravenous  PONV Risk Score and Plan: 4 or greater and Ondansetron, Dexamethasone, Propofol, Midazolam and Treatment may vary due to age or medical condition  Airway Management Planned: Oral ETT  Additional Equipment:   Intra-op Plan:   Post-operative Plan: Extubation in OR  Informed Consent: I have reviewed the patients History and Physical, chart, labs and discussed the procedure including the risks, benefits and alternatives for the proposed anesthesia with the patient  or authorized representative who has indicated his/her understanding and acceptance.   Dental advisory given  Plan Discussed with: CRNA  Anesthesia Plan Comments:        Anesthesia Quick Evaluation

## 2016-10-07 NOTE — Progress Notes (Signed)
Patient complaining of intense scalp itching , area slightly reddened. No facial or body redness noted.  Pharmacist notified.  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  Advised slowing infusion to 100cc/hr.  Rate slowed as instructed.  Symptons improved within a few minutes.

## 2016-10-07 NOTE — Anesthesia Postprocedure Evaluation (Signed)
Anesthesia Post Note  Patient: Gwendolyn Williams  Procedure(s) Performed: Procedure(s) (LRB): LEFT TOTAL HIP ARTHROPLASTY ANTERIOR APPROACH (Left)     Patient location during evaluation: PACU Anesthesia Type: General Level of consciousness: awake and alert Pain management: pain level controlled Vital Signs Assessment: post-procedure vital signs reviewed and stable Respiratory status: spontaneous breathing, nonlabored ventilation, respiratory function stable and patient connected to nasal cannula oxygen Cardiovascular status: blood pressure returned to baseline and stable Postop Assessment: no signs of nausea or vomiting Anesthetic complications: no    Last Vitals:  Vitals:   10/07/16 1456 10/07/16 1500  BP: 106/61 (!) 122/46  Pulse: 83 80  Resp: 15 14  Temp: (!) 36.4 C 36.6 C    Last Pain:  Vitals:   10/07/16 1445  TempSrc:   PainSc: 5                  Tiajuana Amass

## 2016-10-07 NOTE — Interval H&P Note (Signed)
History and Physical Interval Note:  10/07/2016 10:23 AM  Gwendolyn Williams  has presented today for surgery, with the diagnosis of Left hip osteoarthritis  The various methods of treatment have been discussed with the patient and family. After consideration of risks, benefits and other options for treatment, the patient has consented to  Procedure(s) with comments: LEFT TOTAL HIP ARTHROPLASTY ANTERIOR APPROACH (Left) - 70 mins as a surgical intervention .  The patient's history has been reviewed, patient examined, no change in status, stable for surgery.  I have reviewed the patient's chart and labs.  Questions were answered to the patient's satisfaction.     [name]

## 2016-10-08 LAB — BASIC METABOLIC PANEL
Anion gap: 9 (ref 5–15)
BUN: 22 mg/dL — ABNORMAL HIGH (ref 6–20)
CO2: 27 mmol/L (ref 22–32)
Calcium: 8.6 mg/dL — ABNORMAL LOW (ref 8.9–10.3)
Chloride: 104 mmol/L (ref 101–111)
Creatinine, Ser: 1 mg/dL (ref 0.44–1.00)
GFR calc Af Amer: >60 mL/min (ref >60)
GFR calc non Af Amer: 55 mL/min — ABNORMAL LOW (ref >60)
Glucose, Bld: 121 mg/dL — ABNORMAL HIGH (ref 65–99)
Potassium: 4.3 mmol/L (ref 3.5–5.1)
Sodium: 140 mmol/L (ref 135–145)

## 2016-10-08 LAB — CBC
HCT: 32.7 % — ABNORMAL LOW (ref 36.0–46.0)
Hemoglobin: 10.5 g/dL — ABNORMAL LOW (ref 12.0–15.0)
MCH: 26.8 pg (ref 26.0–34.0)
MCHC: 32.1 g/dL (ref 30.0–36.0)
MCV: 83.4 fL (ref 78.0–100.0)
Platelets: 450 K/uL — ABNORMAL HIGH (ref 150–400)
RBC: 3.92 MIL/uL (ref 3.87–5.11)
RDW: 15.5 % (ref 11.5–15.5)
WBC: 13.8 K/uL — ABNORMAL HIGH (ref 4.0–10.5)

## 2016-10-08 NOTE — Progress Notes (Addendum)
Discharge planning, no HH needs identified. No PT planned. Has RW, declines 3n1. [phone]

## 2016-10-08 NOTE — Evaluation (Signed)
Physical Therapy Evaluation Patient Details Name: Gwendolyn Williams MRN: [id number] DOB: 10/07/1940 Today's Date: 10/08/2016   History of Present Illness  s/p L DA THA  Clinical Impression  Pt is s/p THA resulting in the deficits listed below (see PT Problem List).  Pt will benefit from skilled PT to increase their independence and safety with mobility to allow discharge to the venue listed below.  Pt assisted with ambulating in hallway and reports increased pain with mobility.  Pt states she plans to d/c home likely tomorrow.     Follow Up Recommendations DC plan and follow up therapy as arranged by surgeon;No PT follow up    Equipment Recommendations  None recommended by PT    Recommendations for Other Services       Precautions / Restrictions Precautions Precautions: Fall Restrictions Weight Bearing Restrictions: No Other Position/Activity Restrictions: WBAT      Mobility  Bed Mobility Overal bed mobility: Needs Assistance Bed Mobility: Supine to Sit          General bed mobility comments: pt up in recliner on arrival  Transfers Overall transfer level: Needs assistance Equipment used: Rolling walker (2 wheeled) Transfers: Sit to/from Stand Sit to Stand: Min assist Stand pivot transfers: Min assist       General transfer comment: assist to rise and stabilize, verbal cues for technique  Ambulation/Gait Ambulation/Gait assistance: Min guard Ambulation Distance (Feet): 120 Feet Assistive device: Rolling walker (2 wheeled) Gait Pattern/deviations: Step-to pattern;Antalgic;Decreased stance time - left Gait velocity: decr   General Gait Details: verbal cues for sequence and RW positioning  Stairs            Wheelchair Mobility    Modified Rankin (Stroke Patients Only)       Balance                                             Pertinent Vitals/Pain Pain Assessment: 0-10 Pain Score: 7  Faces Pain Scale: Hurts even more Pain  Location: L hip Pain Descriptors / Indicators: Sore;Aching Pain Intervention(s): Limited activity within patient's tolerance;Monitored during session;Repositioned;Patient requesting pain meds-RN notified    Home Living Family/patient expects to be discharged to:: Private residence Living Arrangements: Spouse/significant other Available Help at Discharge: Family Type of Home: House Home Access: Stairs to enter Entrance Stairs-Rails: None Technical brewer of Steps: 3 Home Layout: One level Home Equipment: Shower seat - built in;Walker - 2 wheels      Prior Function Level of Independence: Independent with assistive device(s)               Hand Dominance        Extremity/Trunk Assessment   Upper Extremity Assessment Upper Extremity Assessment: Overall WFL for tasks assessed    Lower Extremity Assessment Lower Extremity Assessment: LLE deficits/detail LLE Deficits / Details: anticipated post op hip weakness       Communication   Communication: No difficulties  Cognition Arousal/Alertness: Awake/alert Behavior During Therapy: WFL for tasks assessed/performed Overall Cognitive Status: Within Functional Limits for tasks assessed                                        General Comments      Exercises     Assessment/Plan    PT Assessment Patient needs  continued PT services  PT Problem List Decreased strength;Decreased mobility;Pain;Decreased knowledge of use of DME       PT Treatment Interventions DME instruction;Gait training;Therapeutic activities;Therapeutic exercise;Functional mobility training;Patient/family education;Stair training    PT Goals (Current goals can be found in the Care Plan section)  Acute Rehab PT Goals Patient Stated Goal: less pain and return to independence PT Goal Formulation: With patient Time For Goal Achievement: 10/18/16 Potential to Achieve Goals: Good    Frequency Min 3X/week   Barriers to discharge         Co-evaluation               AM-PAC PT "6 Clicks" Daily Activity  Outcome Measure Difficulty turning over in bed (including adjusting bedclothes, sheets and blankets)?: A Little Difficulty moving from lying on back to sitting on the side of the bed? : Total Difficulty sitting down on and standing up from a chair with arms (e.g., wheelchair, bedside commode, etc,.)?: Total Help needed moving to and from a bed to chair (including a wheelchair)?: A Little Help needed walking in hospital room?: A Little Help needed climbing 3-5 steps with a railing? : A Little 6 Click Score: 14    End of Session Equipment Utilized During Treatment: Gait belt Activity Tolerance: Patient tolerated treatment well Patient left: in chair;with call bell/phone within reach;with family/visitor present Nurse Communication: Mobility status;Patient requests pain meds PT Visit Diagnosis: Difficulty in walking, not elsewhere classified (R26.2)    Time: 5102-5852 PT Time Calculation (min) (ACUTE ONLY): 22 min   Charges:   PT Evaluation $PT Eval Low Complexity: 1 Procedure     PT G Codes:        Carmelia Bake, PT, DPT 10/08/2016 Pager: [phone]   [name] 10/08/2016, 12:45 PM

## 2016-10-08 NOTE — Evaluation (Signed)
Occupational Therapy Evaluation Patient Details Name: SOPHIRA RUMLER MRN: [id number] DOB: 12/17/1940 Today's Date: 10/08/2016    History of Present Illness s/p L DA THA   Clinical Impression   This 76 year old female was admitted for the above sx.  She was mod I with adls prior to admission.  She was limited by pain this session, but was able to get up to chair and educated on AE.  She is about 5' tall. Will need to further assess need for toilet DME as well as increase activity tolerance for adls. Goals in acute are for min guard level.    Follow Up Recommendations  Supervision/Assistance - 24 hour    Equipment Recommendations   (to be further assessed)    Recommendations for Other Services       Precautions / Restrictions Precautions Precautions: Fall Restrictions Weight Bearing Restrictions: No      Mobility Bed Mobility Overal bed mobility: Needs Assistance Bed Mobility: Supine to Sit     Supine to sit: Min assist;Mod assist;HOB elevated     General bed mobility comments: cues for sequence  Transfers Overall transfer level: Needs assistance Equipment used: Rolling walker (2 wheeled) Transfers: Sit to/from [facility] Sit to Stand: Min assist Stand pivot transfers: Min assist       General transfer comment: assist to rise and stabilize. Cues for posture, sequence. Extra time to get adjusted wtih position changes    Balance                                           ADL either performed or assessed with clinical judgement   ADL Overall ADL's : Needs assistance/impaired     Grooming: Set up;Sitting   Upper Body Bathing: Set up;Sitting   Lower Body Bathing: Maximal assistance;Sit to/from stand   Upper Body Dressing : Set up;Sitting   Lower Body Dressing: Total assistance;Sit to/from stand   Toilet Transfer: Minimal assistance;Stand-pivot;RW (to chair)             General ADL Comments: pt has catheter.  She was  able to perform SPT to chair.  BP 105/62.  Educated on AE but pt did not use.  She may be interested in reacher and a long sponge.  Husband can assist her; he does have some back pain     Vision         Perception     Praxis      Pertinent Vitals/Pain Pain Assessment: Faces Faces Pain Scale: Hurts even more Pain Location: L hip Pain Descriptors / Indicators: Aching Pain Intervention(s): Limited activity within patient's tolerance;Monitored during session;Premedicated before session;Repositioned;Ice applied     Hand Dominance     Extremity/Trunk Assessment Upper Extremity Assessment Upper Extremity Assessment: Overall WFL for tasks assessed           Communication Communication Communication: No difficulties   Cognition Arousal/Alertness: Awake/alert Behavior During Therapy: WFL for tasks assessed/performed Overall Cognitive Status: Within Functional Limits for tasks assessed                                     General Comments       Exercises     Shoulder Instructions      Home Living Family/patient expects to be discharged to:: Private residence Living  Arrangements: Spouse/significant other Available Help at Discharge: Family               Bathroom Shower/Tub: Occupational psychologist: Standard     Home Equipment: Shower seat - built in;Walker - 2 wheels          Prior Functioning/Environment Level of Independence: Independent with assistive device(s)                 OT Problem List: Decreased strength;Decreased activity tolerance;Decreased knowledge of use of DME or AE;Decreased knowledge of precautions;Pain      OT Treatment/Interventions: Self-care/ADL training;DME and/or AE instruction;Patient/family education    OT Goals(Current goals can be found in the care plan section) Acute Rehab OT Goals Patient Stated Goal: less pain and return to independence OT Goal Formulation: With patient Time For Goal  Achievement: 10/15/16 Potential to Achieve Goals: Good ADL Goals Pt Will Perform Grooming: with min guard assist;standing Pt Will Perform Lower Body Bathing: with min guard assist;with adaptive equipment;sit to/from stand Pt Will Perform Lower Body Dressing: with min guard assist;with adaptive equipment;sit to/from stand (pants with reacher) Pt Will Transfer to Toilet: with min guard assist;bedside commode;ambulating;regular height toilet (vs) Pt Will Perform Toileting - Clothing Manipulation and hygiene: with min guard assist;sit to/from stand Pt Will Perform Tub/Shower Transfer: Shower transfer;with min guard assist;shower seat;ambulating  OT Frequency: Min 2X/week   Barriers to D/C:            Co-evaluation              AM-PAC PT "6 Clicks" Daily Activity     Outcome Measure Help from another person eating meals?: None Help from another person taking care of personal grooming?: A Little Help from another person toileting, which includes using toliet, bedpan, or urinal?: A Lot Help from another person bathing (including washing, rinsing, drying)?: A Lot Help from another person to put on and taking off regular upper body clothing?: A Little Help from another person to put on and taking off regular lower body clothing?: Total 6 Click Score: 15   End of Session    Activity Tolerance: Patient limited by pain Patient left: in chair;with call bell/phone within reach;with family/visitor present  OT Visit Diagnosis: Muscle weakness (generalized) (M62.81);Pain Pain - Right/Left: Left Pain - part of body: Hip                Time: 2706-2376 OT Time Calculation (min): 38 min Charges:  OT General Charges $OT Visit: 1 Procedure OT Evaluation $OT Eval Low Complexity: 1 Procedure OT Treatments $Therapeutic Activity: 8-22 mins G-Codes:     Lesle Chris, OTR/L [phone] 10/08/2016  Finesse Fielder 10/08/2016, 9:47 AM

## 2016-10-08 NOTE — Progress Notes (Signed)
Physical Therapy Treatment Patient Details Name: Gwendolyn Williams MRN: [id number] DOB: March 31, 1941 Today's Date: 10/08/2016    History of Present Illness s/p L DA THA    PT Comments    Pt tolerated ambulating in hallway better this afternoon and able to improve distance.  Pt will need to practice steps prior to d/c home tomorrow.   Follow Up Recommendations  DC plan and follow up therapy as arranged by surgeon;No PT follow up     Equipment Recommendations  None recommended by PT    Recommendations for Other Services       Precautions / Restrictions Precautions Precautions: Fall Restrictions Weight Bearing Restrictions: No Other Position/Activity Restrictions: WBAT    Mobility  Bed Mobility Overal bed mobility: Needs Assistance Bed Mobility: Supine to Sit;Sit to Supine     Supine to sit: Min assist;HOB elevated Sit to supine: Min assist   General bed mobility comments: pt up in recliner on arrival  Transfers Overall transfer level: Needs assistance Equipment used: Rolling walker (2 wheeled) Transfers: Sit to/from Stand Sit to Stand: Min assist         General transfer comment: Assist to rise and stabilize, verbal cues given for hand placement and technique  Ambulation/Gait Ambulation/Gait assistance: Min guard Ambulation Distance (Feet): 200 Feet Assistive device: Rolling walker (2 wheeled) Gait Pattern/deviations: Step-to pattern;Antalgic;Decreased stance time - left Gait velocity: decr   General Gait Details: Pt reports feeling a lot better ambulating this afternoon, verbal cues given for sequence reinforcement and RW positioning    Stairs            Wheelchair Mobility    Modified Rankin (Stroke Patients Only)       Balance                                            Cognition Arousal/Alertness: Awake/alert Behavior During Therapy: WFL for tasks assessed/performed Overall Cognitive Status: Within Functional Limits for  tasks assessed                                        Exercises      General Comments        Pertinent Vitals/Pain Pain Assessment: 0-10 Pain Score: 5  Pain Location: L hip Pain Descriptors / Indicators: Sore;Aching Pain Intervention(s): Limited activity within patient's tolerance;Monitored during session;Ice applied;Repositioned    Home Living                      Prior Function            PT Goals (current goals can now be found in the care plan section) Acute Rehab PT Goals Patient Stated Goal: less pain and return to independence PT Goal Formulation: With patient Time For Goal Achievement: 10/18/16 Potential to Achieve Goals: Good    Frequency    7X/week      PT Plan Current plan remains appropriate    Co-evaluation              AM-PAC PT "6 Clicks" Daily Activity  Outcome Measure  Difficulty turning over in bed (including adjusting bedclothes, sheets and blankets)?: A Little Difficulty moving from lying on back to sitting on the side of the bed? : Total Difficulty sitting down on and standing up  from a chair with arms (e.g., wheelchair, bedside commode, etc,.)?: Total Help needed moving to and from a bed to chair (including a wheelchair)?: A Little Help needed walking in hospital room?: A Little Help needed climbing 3-5 steps with a railing? : A Little 6 Click Score: 14    End of Session Equipment Utilized During Treatment: Gait belt Activity Tolerance: Patient tolerated treatment well Patient left: in bed;with family/visitor present;with call bell/phone within reach Nurse Communication: Mobility status;Patient requests pain meds PT Visit Diagnosis: Difficulty in walking, not elsewhere classified (R26.2)     Time: 2122-4825 PT Time Calculation (min) (ACUTE ONLY): 28 min  Charges:  $Gait Training: 23-37 mins                    G Codes:      Progress Note written by: Olegario Shearer, SPT I have reviewed this note and  agree with all findings.  Carmelia Bake, PT, DPT 10/08/2016 Pager: [phone]    [name] 10/08/2016, 4:23 PM

## 2016-10-08 NOTE — Progress Notes (Signed)
     Subjective: 1 Day Post-Op Procedure(s) (LRB): LEFT TOTAL HIP ARTHROPLASTY ANTERIOR APPROACH (Left)   Patient reports pain as mild, pain controlled. Some issues with low BP since surgery, otherwise no events throughout the night.  Plan for discharge tomorrow due to need for inpatient therapy to meet goal of being discharged home safely with family/caregiver.   Objective:   VITALS:   Vitals:   10/08/16 0134 10/08/16 0613  BP: (!) 82/53 97/61  Pulse:  73  Resp:  17  Temp:  98.1 F (36.7 C)    Dorsiflexion/Plantar flexion intact Incision: dressing C/D/I No cellulitis present Compartment soft  LABS  Recent Labs  10/08/16 0538  HGB 10.5*  HCT 32.7*  WBC 13.8*  PLT 450*     Recent Labs  10/08/16 0538  NA 140  K 4.3  BUN 22*  CREATININE 1.00  GLUCOSE 121*     Assessment/Plan: 1 Day Post-Op Procedure(s) (LRB): LEFT TOTAL HIP ARTHROPLASTY ANTERIOR APPROACH (Left) Foley cath d/c'ed Advance diet Up with therapy D/C IV fluids Discharge home eventually, when ready     Gwendolyn Williams. Gwendolyn Williams   PAC  10/08/2016, 9:42 AM

## 2016-10-09 LAB — CBC
HCT: 28.6 % — ABNORMAL LOW (ref 36.0–46.0)
Hemoglobin: 9.4 g/dL — ABNORMAL LOW (ref 12.0–15.0)
MCH: 27.3 pg (ref 26.0–34.0)
MCHC: 32.9 g/dL (ref 30.0–36.0)
MCV: 83.1 fL (ref 78.0–100.0)
Platelets: 414 10*3/uL — ABNORMAL HIGH (ref 150–400)
RBC: 3.44 MIL/uL — ABNORMAL LOW (ref 3.87–5.11)
RDW: 15.7 % — ABNORMAL HIGH (ref 11.5–15.5)
WBC: 12.3 10*3/uL — ABNORMAL HIGH (ref 4.0–10.5)

## 2016-10-09 LAB — BASIC METABOLIC PANEL
Anion gap: 4 — ABNORMAL LOW (ref 5–15)
BUN: 20 mg/dL (ref 6–20)
CO2: 28 mmol/L (ref 22–32)
Calcium: 8.5 mg/dL — ABNORMAL LOW (ref 8.9–10.3)
Chloride: 106 mmol/L (ref 101–111)
Creatinine, Ser: 0.94 mg/dL (ref 0.44–1.00)
GFR calc Af Amer: >60 mL/min (ref >60)
GFR calc non Af Amer: 59 mL/min — ABNORMAL LOW (ref >60)
Glucose, Bld: 106 mg/dL — ABNORMAL HIGH (ref 65–99)
Potassium: 3.5 mmol/L (ref 3.5–5.1)
Sodium: 138 mmol/L (ref 135–145)

## 2016-10-09 MED ORDER — ONDANSETRON HCL 4 MG PO TABS: 4.0000 mg | ORAL_TABLET | Freq: Four times a day (QID) | ORAL | 0 refills | Status: DC | PRN

## 2016-10-09 NOTE — Progress Notes (Signed)
Physical Therapy Treatment Patient Details Name: JAEDAN SCHUMAN MRN: [id number] DOB: 09/01/1940 Today's Date: 10/09/2016    History of Present Illness s/p L DA THA    PT Comments    Pt reports feeling unwell this morning resulting in some dizziness during treatment. Overall, pt performed well and felt comfortable going home. Pt ambulated approx. 200 feet and was instructed on stair mobility and therapeutic exercises to be conducted at home.   Follow Up Recommendations  DC plan and follow up therapy as arranged by surgeon;No PT follow up     Equipment Recommendations  None recommended by PT    Recommendations for Other Services       Precautions / Restrictions Precautions Precautions: Fall Restrictions Weight Bearing Restrictions: No Other Position/Activity Restrictions: WBAT    Mobility  Bed Mobility Overal bed mobility: Needs Assistance Bed Mobility: Supine to Sit;Sit to Supine     Supine to sit: HOB elevated;Supervision Sit to supine: Min assist   General bed mobility comments: Min assist provided on sit to supine to help lift leg into bed  Transfers Overall transfer level: Needs assistance Equipment used: Rolling walker (2 wheeled) Transfers: Sit to/from Stand Sit to Stand: Supervision         General transfer comment: Pt was able to transfer from sit to stand with supervision, verbal cues provided for handplacement on RW  Ambulation/Gait Ambulation/Gait assistance: Min guard Ambulation Distance (Feet): 200 Feet Assistive device: Rolling walker (2 wheeled) Gait Pattern/deviations: Step-to pattern;Antalgic;Decreased stance time - left Gait velocity: decr   General Gait Details: Pt reports feeling unwell this morning and was experiencing a little dizziness throughout treatment   Stairs Stairs: Yes   Stair Management: No rails;With walker;Backwards;Step to pattern Number of Stairs: 2 General stair comments: Pt was instructed on stair mobility and  performed well, pt ascended stairs backwards with support of husband stabilizing the walker   Wheelchair Mobility    Modified Rankin (Stroke Patients Only)       Balance                                            Cognition Arousal/Alertness: Awake/alert Behavior During Therapy: WFL for tasks assessed/performed Overall Cognitive Status: Within Functional Limits for tasks assessed                                        Exercises General Exercises - Lower Extremity Ankle Circles/Pumps: AROM;10 reps;Left;Seated Quad Sets: AROM;10 reps;Left;Supine Long Arc Quad: 10 reps;AROM;Seated Heel Slides: 10 reps;AROM;Supine Hip ABduction/ADduction: AROM;10 reps;Standing Hip Flexion/Marching: AROM;10 reps;Standing    General Comments        Pertinent Vitals/Pain Pain Assessment: 0-10 Pain Score: 4  Pain Location: L hip Pain Descriptors / Indicators: Sore;Aching Pain Intervention(s): Limited activity within patient's tolerance;Monitored during session;Ice applied;Repositioned    Home Living                      Prior Function            PT Goals (current goals can now be found in the care plan section) Acute Rehab PT Goals Patient Stated Goal: less pain and return to independence PT Goal Formulation: With patient Time For Goal Achievement: 10/18/16 Potential to Achieve Goals: Good Progress towards PT goals: Progressing  toward goals    Frequency    7X/week      PT Plan Current plan remains appropriate    Co-evaluation              AM-PAC PT "6 Clicks" Daily Activity  Outcome Measure  Difficulty turning over in bed (including adjusting bedclothes, sheets and blankets)?: A Little Difficulty moving from lying on back to sitting on the side of the bed? : A Lot Difficulty sitting down on and standing up from a chair with arms (e.g., wheelchair, bedside commode, etc,.)?: A Lot Help needed moving to and from a bed to  chair (including a wheelchair)?: A Little Help needed walking in hospital room?: A Little Help needed climbing 3-5 steps with a railing? : A Little 6 Click Score: 16    End of Session Equipment Utilized During Treatment: Gait belt Activity Tolerance: Patient tolerated treatment well Patient left: in bed;with family/visitor present;with call bell/phone within reach Nurse Communication: Mobility status PT Visit Diagnosis: Difficulty in walking, not elsewhere classified (R26.2)     Time: 2518-9842 PT Time Calculation (min) (ACUTE ONLY): 26 min  Charges:  $Gait Training: 8-22 mins $Therapeutic Exercise: 8-22 mins                    G Codes:       Olegario Shearer, SPT   Reino Bellis 10/09/2016, 1:38 PM

## 2016-10-09 NOTE — Progress Notes (Signed)
OT Cancellation Note  Patient Details Name: Gwendolyn Williams MRN: [id number] DOB: 1940-08-08   Cancelled Treatment:    Reason Eval/Treat Not Completed: OT screened, no further needs identified, will sign off. Pt feels she does not need a 3n1, husband can A her with LBADLs until she can do them by herself ( I did educate them on the most efficient sequence of getting dressed) and educated on that perhaps if she does have to use the RW to get into out of shower stall that backing in my go better than going in forward due to built in seat she has in shower.  Almon Register [phone] 10/09/2016, 9:12 AM

## 2016-10-09 NOTE — Progress Notes (Addendum)
     Subjective: 2 Days Post-Op Procedure(s) (LRB): LEFT TOTAL HIP ARTHROPLASTY ANTERIOR APPROACH (Left)   Seen by Dr. Alvan Dame. Patient reports pain as mild, pain controlled.  No events throughout the night.  Ready to be discharged home.   Objective:   VITALS:   Vitals:   10/08/16 2209 10/09/16 0644  BP: 102/68 (!) 91/54  Pulse: 67 76  Resp: 17 18  Temp: 98.9 F (37.2 C) 98.4 F (36.9 C)    Dorsiflexion/Plantar flexion intact Incision: dressing C/D/I No cellulitis present Compartment soft  LABS  Recent Labs  10/08/16 0538 10/09/16 0509  HGB 10.5* 9.4*  HCT 32.7* 28.6*  WBC 13.8* 12.3*  PLT 450* 414*     Recent Labs  10/08/16 0538 10/09/16 0509  NA 140 138  K 4.3 3.5  BUN 22* 20  CREATININE 1.00 0.94  GLUCOSE 121* 106*     Assessment/Plan: 2 Days Post-Op Procedure(s) (LRB): LEFT TOTAL HIP ARTHROPLASTY ANTERIOR APPROACH (Left) Up with therapy Discharge home Follow up in 2 weeks at [facility]. Follow up with Alexxus Sobh D in 2 weeks.  Contact information:  [facility] [address], [address] [place] [address]   PAC  10/09/2016, 7:46 AM   Up yesterday with therapy after challenging first 12 hours or so.  Did well walking halls, getting up this am to use restroom Home today RTC in 2 weeks

## 2016-10-14 NOTE — Discharge Summary (Signed)
Physician Discharge Summary  Patient ID: Gwendolyn Williams MRN: [id number] DOB/AGE: October 07, 1940 76 y.o.  Admit date: 10/07/2016 Discharge date: 10/09/2016   Procedures:  Procedure(s) (LRB): LEFT TOTAL HIP ARTHROPLASTY ANTERIOR APPROACH (Left)  Attending Physician:  Dr. Paralee Cancel   Admission Diagnoses:   Left hip primary OA / pain  Discharge Diagnoses:  Principal Problem:   S/P left THA, AA  Past Medical History:  Diagnosis Date  . Arthritis   . Asthma    controlled with singulair  . Breast cancer ([place])    stage I left breast  . GERD (gastroesophageal reflux disease)   . Hematoma of arm 08/09/2012  . 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 Hyperlipidemia   . Hypertension   . 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 Pneumonia    viral pneumonia as a kid  . PONV (postoperative nausea and vomiting)     HPI:     Gwendolyn Williams, 76 y.o. female, has a history of pain and functional disability in the left hip(s) due to arthritis and patient has failed non-surgical conservative treatments for greater than 12 weeks to include NSAID's and/or analgesics, corticosteriod injections and activity modification.  Onset of symptoms was gradual starting 5-7 years ago with gradually worsening course since that time.The patient noted no past surgery on the left hip(s).  Patient currently rates pain in the left hip at 9 out of 10 with activity. Patient has night pain, worsening of pain with activity and weight bearing, trendelenberg gait, pain that interfers with activities of daily living, pain with passive range of motion, crepitus and joint swelling. Patient has evidence of periarticular osteophytes and joint space narrowing by imaging studies. This condition presents safety issues increasing the risk of falls.  There is no current active infection.   Risks, benefits and expectations were discussed with the  patient.  Risks including but not limited to the risk of anesthesia, blood clots, nerve damage, blood vessel damage, failure of the prosthesis, infection and up to and including death.  Patient understand the risks, benefits and expectations and wishes to proceed with surgery.   PCP: Gaynelle Arabian, MD   Discharged Condition: good  Hospital Course:  Patient underwent the above stated procedure on 10/07/2016. Patient tolerated the procedure well and brought to the recovery room in good condition and subsequently to the floor.  POD #1 BP: 97/61 ; Pulse: 73 ; Temp: 98.1 F (36.7 C) ; Resp: 17 Patient reports pain as mild, pain controlled. Some issues with low BP since surgery, otherwise no events throughout the night.  Plan for discharge tomorrowdue to need for inpatient therapy to meet goal of being discharged home safely with family/caregiver. Dorsiflexion/plantar flexion intact, incision: dressing C/D/I, no cellulitis present and compartment soft.   LABS  Basename    HGB     10.5  HCT     32.7   POD #2  BP: 91/54 ; Pulse: 76 ; Temp: 98.4 F (36.9 C) ; Resp: 18 Patient reports pain as mild, pain controlled.  No events throughout the night.  Ready to be discharged home.  Dorsiflexion/plantar flexion intact, incision: dressing C/D/I, no cellulitis present and compartment soft.   LABS  Basename    HGB     9.4  HCT     28.6    Discharge Exam: General appearance: alert, cooperative and no distress Extremities: Homans sign is negative,  no sign of DVT, no edema, redness or tenderness in the calves or thighs and no ulcers, gangrene or trophic changes  Disposition: Home with follow up in 2 weeks   Follow-up Information    Paralee Cancel, MD. Schedule an appointment as soon as possible for a visit in 2 week(s).   Specialty:  Orthopedic Surgery Contact information: [address] [place] [address] [phone]           Discharge Instructions    Call MD /  Call 911    Complete by:  As directed    If you experience chest pain or shortness of breath, CALL 911 and be transported to the hospital emergency room.  If you develope a fever above 101 F, pus (white drainage) or increased drainage or redness at the wound, or calf pain, call your surgeon's office.   Change dressing    Complete by:  As directed    Maintain surgical dressing until follow up in the clinic. If the edges start to pull up, may reinforce with tape. If the dressing is no longer working, may remove and cover with gauze and tape, but must keep the area dry and clean.  Call with any questions or concerns.   Constipation Prevention    Complete by:  As directed    Drink plenty of fluids.  Prune juice may be helpful.  You may use a stool softener, such as Colace (over the counter) 100 mg twice a day.  Use MiraLax (over the counter) for constipation as needed.   Diet - low sodium heart healthy    Complete by:  As directed    Discharge instructions    Complete by:  As directed    Maintain surgical dressing until follow up in the clinic. If the edges start to pull up, may reinforce with tape. If the dressing is no longer working, may remove and cover with gauze and tape, but must keep the area dry and clean.  Follow up in 2 weeks at [facility]. Call with any questions or concerns.   Increase activity slowly as tolerated    Complete by:  As directed    Weight bearing as tolerated with assist device (walker, cane, etc) as directed, use it as long as suggested by your surgeon or therapist, typically at least 4-6 weeks.   TED hose    Complete by:  As directed    Use stockings (TED hose) for 2 weeks on both leg(s).  You may remove them at night for sleeping.      Allergies as of 10/09/2016      Reactions   Penicillins Hives   Has patient had a PCN reaction causing immediate rash, facial/tongue/throat swelling, SOB or lightheadedness with hypotension: Unknown Has patient had a PCN  reaction causing severe rash involving mucus membranes or skin necrosis: Unknown Has patient had a PCN reaction that required hospitalization: No Has patient had a PCN reaction occurring within the last 10 years: No If all of the above answers are "NO", then may proceed with Cephalosporin use.   Sulfa Antibiotics Other (See Comments)   unknown      Medication List    STOP taking these medications   acetaminophen 500 MG tablet Commonly known as:  TYLENOL   enoxaparin 60 MG/0.6ML injection Commonly known as:  LOVENOX     TAKE these medications   anastrozole 1 MG tablet Commonly known as:  ARIMIDEX Take 1 mg by mouth daily.  aspirin 81 MG tablet Take 81 mg by mouth daily.   CALCIUM PO Take 1 tablet by mouth daily. Citracal   docusate sodium 100 MG capsule Commonly known as:  COLACE Take 1 capsule (100 mg total) by mouth 2 (two) times daily. What changed:  medication strength  when to take this   ferrous sulfate 325 (65 FE) MG tablet Commonly known as:  FERROUSUL Take 1 tablet (325 mg total) by mouth 3 (three) times daily with meals.   folic acid 1 MG tablet Commonly known as:  FOLVITE Take 1 mg by mouth daily.   HYDROcodone-acetaminophen 7.5-325 MG tablet Commonly known as:  NORCO Take 1-2 tablets by mouth every 4 (four) hours as needed for moderate pain or severe pain.   lidocaine 5 % Commonly known as:  LIDODERM Place 1 patch onto the skin daily. Remove & Discard patch within 12 hours or as directed by MD   losartan 100 MG tablet Commonly known as:  COZAAR Take 100 mg by mouth daily.   methocarbamol 500 MG tablet Commonly known as:  ROBAXIN Take 1 tablet (500 mg total) by mouth every 6 (six) hours as needed for muscle spasms.   montelukast 10 MG tablet Commonly known as:  SINGULAIR Take 1 tablet by mouth at bedtime.   NEXIUM PO Take 40 mg by mouth daily.   ondansetron 4 MG tablet Commonly known as:  ZOFRAN Take 1 tablet (4 mg total) by mouth  every 6 (six) hours as needed for nausea.   polyethylene glycol packet Commonly known as:  MIRALAX / GLYCOLAX Take 17 g by mouth 2 (two) times daily. What changed:  when to take this   rivaroxaban 20 MG Tabs tablet Commonly known as:  XARELTO Take 1 tablet (20 mg total) by mouth daily with supper.   simvastatin 20 MG tablet Commonly known as:  ZOCOR Take 20 mg by mouth daily at 6 PM.   triamterene-hydrochlorothiazide 37.5-25 MG tablet Commonly known as:  MAXZIDE-25 Take 1 tablet by mouth daily.   Vitamin D 2000 units Caps Take 2,000 Units by mouth daily.        Signed: [place]. Abrie Egloff   PA-C  10/14/2016, 3:04 PM

## 2016-10-22 DIAGNOSIS — Z96642 Presence of left artificial hip joint: Secondary | ICD-10-CM | POA: Diagnosis not present

## 2016-10-22 DIAGNOSIS — Z471 Aftercare following joint replacement surgery: Secondary | ICD-10-CM | POA: Diagnosis not present

## 2016-11-05 DIAGNOSIS — Z96642 Presence of left artificial hip joint: Secondary | ICD-10-CM | POA: Diagnosis not present

## 2016-11-05 DIAGNOSIS — Z471 Aftercare following joint replacement surgery: Secondary | ICD-10-CM | POA: Diagnosis not present

## 2016-11-10 ENCOUNTER — Other Ambulatory Visit: Payer: Self-pay | Admitting: Family Medicine

## 2016-11-10 ENCOUNTER — Ambulatory Visit
Admission: RE | Admit: 2016-11-10 | Discharge: 2016-11-10 | Disposition: A | Discharge status: Home / Self Care | Payer: Medicare Other | Source: Ambulatory Visit | Attending: Family Medicine | Admitting: Family Medicine

## 2016-11-10 DIAGNOSIS — R1030 Lower abdominal pain, unspecified: Secondary | ICD-10-CM | POA: Diagnosis not present

## 2016-11-10 DIAGNOSIS — R198 Other specified symptoms and signs involving the digestive system and abdomen: Secondary | ICD-10-CM

## 2016-11-10 DIAGNOSIS — I129 Hypertensive chronic kidney disease with stage 1 through stage 4 chronic kidney disease, or unspecified chronic kidney disease: Secondary | ICD-10-CM | POA: Diagnosis not present

## 2016-11-10 DIAGNOSIS — R110 Nausea: Secondary | ICD-10-CM | POA: Diagnosis not present

## 2016-11-10 DIAGNOSIS — R1900 Intra-abdominal and pelvic swelling, mass and lump, unspecified site: Secondary | ICD-10-CM | POA: Diagnosis not present

## 2016-11-18 ENCOUNTER — Other Ambulatory Visit: Payer: Self-pay | Admitting: Family Medicine

## 2016-11-18 DIAGNOSIS — R1900 Intra-abdominal and pelvic swelling, mass and lump, unspecified site: Secondary | ICD-10-CM

## 2016-11-18 DIAGNOSIS — R198 Other specified symptoms and signs involving the digestive system and abdomen: Secondary | ICD-10-CM

## 2016-11-19 DIAGNOSIS — Z96642 Presence of left artificial hip joint: Secondary | ICD-10-CM | POA: Diagnosis not present

## 2016-11-19 DIAGNOSIS — Z471 Aftercare following joint replacement surgery: Secondary | ICD-10-CM | POA: Diagnosis not present

## 2016-11-20 ENCOUNTER — Ambulatory Visit
Admission: RE | Admit: 2016-11-20 | Discharge: 2016-11-20 | Disposition: A | Discharge status: Home / Self Care | Payer: Medicare Other | Source: Ambulatory Visit | Attending: Family Medicine | Admitting: Family Medicine

## 2016-11-20 DIAGNOSIS — N183 Chronic kidney disease, stage 3 (moderate): Secondary | ICD-10-CM | POA: Diagnosis not present

## 2016-11-20 DIAGNOSIS — Z853 Personal history of malignant neoplasm of breast: Secondary | ICD-10-CM | POA: Diagnosis not present

## 2016-11-20 DIAGNOSIS — E559 Vitamin D deficiency, unspecified: Secondary | ICD-10-CM | POA: Diagnosis not present

## 2016-11-20 DIAGNOSIS — R198 Other specified symptoms and signs involving the digestive system and abdomen: Secondary | ICD-10-CM

## 2016-11-20 DIAGNOSIS — C946 Myelodysplastic disease, not classified: Secondary | ICD-10-CM | POA: Diagnosis not present

## 2016-11-20 DIAGNOSIS — I129 Hypertensive chronic kidney disease with stage 1 through stage 4 chronic kidney disease, or unspecified chronic kidney disease: Secondary | ICD-10-CM | POA: Diagnosis not present

## 2016-11-20 DIAGNOSIS — E7800 Pure hypercholesterolemia, unspecified: Secondary | ICD-10-CM | POA: Diagnosis not present

## 2016-11-20 DIAGNOSIS — M810 Age-related osteoporosis without current pathological fracture: Secondary | ICD-10-CM | POA: Diagnosis not present

## 2016-11-20 DIAGNOSIS — K219 Gastro-esophageal reflux disease without esophagitis: Secondary | ICD-10-CM | POA: Diagnosis not present

## 2016-11-20 DIAGNOSIS — K8020 Calculus of gallbladder without cholecystitis without obstruction: Secondary | ICD-10-CM | POA: Diagnosis not present

## 2016-11-22 DIAGNOSIS — M542 Cervicalgia: Secondary | ICD-10-CM | POA: Diagnosis not present

## 2016-12-10 ENCOUNTER — Other Ambulatory Visit: Payer: Medicare Other

## 2016-12-10 DIAGNOSIS — Z471 Aftercare following joint replacement surgery: Secondary | ICD-10-CM | POA: Diagnosis not present

## 2016-12-10 DIAGNOSIS — Z96642 Presence of left artificial hip joint: Secondary | ICD-10-CM | POA: Diagnosis not present

## 2016-12-11 DIAGNOSIS — R109 Unspecified abdominal pain: Secondary | ICD-10-CM | POA: Diagnosis not present

## 2016-12-19 ENCOUNTER — Other Ambulatory Visit (INDEPENDENT_AMBULATORY_CARE_PROVIDER_SITE_OTHER): Payer: Medicare Other

## 2016-12-19 DIAGNOSIS — D471 Chronic myeloproliferative disease: Secondary | ICD-10-CM | POA: Diagnosis not present

## 2016-12-19 DIAGNOSIS — K909 Intestinal malabsorption, unspecified: Secondary | ICD-10-CM | POA: Diagnosis not present

## 2016-12-19 DIAGNOSIS — K55069 Acute infarction of intestine, part and extent unspecified: Secondary | ICD-10-CM | POA: Diagnosis not present

## 2016-12-19 DIAGNOSIS — Z7901 Long term (current) use of anticoagulants: Secondary | ICD-10-CM

## 2016-12-19 DIAGNOSIS — Z853 Personal history of malignant neoplasm of breast: Secondary | ICD-10-CM

## 2016-12-20 LAB — CBC WITH DIFFERENTIAL/PLATELET
Basophils Absolute: 0.1 10*3/uL (ref 0.0–0.2)
Basos: 1 %
EOS (ABSOLUTE): 0.3 10*3/uL (ref 0.0–0.4)
Eos: 3 %
Hematocrit: 42.7 % (ref 34.0–46.6)
Hemoglobin: 13.6 g/dL (ref 11.1–15.9)
Immature Grans (Abs): 0.1 10*3/uL (ref 0.0–0.1)
Immature Granulocytes: 1 %
Lymphocytes Absolute: 1.4 10*3/uL (ref 0.7–3.1)
Lymphs: 14 %
MCH: 25.5 pg — ABNORMAL LOW (ref 26.6–33.0)
MCHC: 31.9 g/dL (ref 31.5–35.7)
MCV: 80 fL (ref 79–97)
Monocytes Absolute: 0.5 10*3/uL (ref 0.1–0.9)
Monocytes: 5 %
Neutrophils Absolute: 7.4 10*3/uL — ABNORMAL HIGH (ref 1.4–7.0)
Neutrophils: 76 %
Platelets: 597 10*3/uL — ABNORMAL HIGH (ref 150–379)
RBC: 5.34 x10E6/uL — ABNORMAL HIGH (ref 3.77–5.28)
RDW: 16.4 % — ABNORMAL HIGH (ref 12.3–15.4)
WBC: 9.8 10*3/uL (ref 3.4–10.8)

## 2016-12-20 LAB — IRON AND TIBC
Iron Saturation: 9 % — CL (ref 15–55)
Iron: 32 ug/dL (ref 27–139)
Total Iron Binding Capacity: 353 ug/dL (ref 250–450)
UIBC: 321 ug/dL (ref 118–369)

## 2016-12-20 LAB — FERRITIN: Ferritin: 24 ng/mL (ref 15–150)

## 2016-12-22 ENCOUNTER — Other Ambulatory Visit: Payer: Self-pay | Admitting: Oncology

## 2016-12-22 ENCOUNTER — Encounter: Payer: Self-pay | Admitting: Oncology

## 2016-12-22 ENCOUNTER — Telehealth: Payer: Self-pay | Admitting: *Deleted

## 2016-12-22 DIAGNOSIS — D471 Chronic myeloproliferative disease: Secondary | ICD-10-CM

## 2016-12-22 DIAGNOSIS — K55069 Acute infarction of intestine, part and extent unspecified: Secondary | ICD-10-CM

## 2016-12-22 NOTE — Telephone Encounter (Signed)
Pt called / informed "Hb now back to normal at 13.6; platelets running a little high at 597,000. Will Check again in 2 months."per Dr Ouida Sills. Stated she looked at her labs on My Chart. Asked about an appt - informed I will have Tamela Oddi S to call her.

## 2016-12-22 NOTE — Telephone Encounter (Signed)
-----   Message from Annia Belt, MD sent at 12/22/2016  8:10 AM EDT ----- Call pt: Hb now back to normal at 13.6; platelets running a little high at 597,000. Will  Check again in 2 months.  Dimitrios Balestrieri/Doris - she needs a follow up appt with me soon - I do not see one on the schedule

## 2016-12-30 DIAGNOSIS — Z471 Aftercare following joint replacement surgery: Secondary | ICD-10-CM | POA: Diagnosis not present

## 2016-12-30 DIAGNOSIS — Z96642 Presence of left artificial hip joint: Secondary | ICD-10-CM | POA: Diagnosis not present

## 2017-01-19 DIAGNOSIS — M5412 Radiculopathy, cervical region: Secondary | ICD-10-CM | POA: Diagnosis not present

## 2017-01-19 DIAGNOSIS — M542 Cervicalgia: Secondary | ICD-10-CM | POA: Diagnosis not present

## 2017-01-19 DIAGNOSIS — M5416 Radiculopathy, lumbar region: Secondary | ICD-10-CM | POA: Diagnosis not present

## 2017-01-19 DIAGNOSIS — M545 Low back pain: Secondary | ICD-10-CM | POA: Diagnosis not present

## 2017-01-22 DIAGNOSIS — Z853 Personal history of malignant neoplasm of breast: Secondary | ICD-10-CM | POA: Diagnosis not present

## 2017-01-22 DIAGNOSIS — R928 Other abnormal and inconclusive findings on diagnostic imaging of breast: Secondary | ICD-10-CM | POA: Diagnosis not present

## 2017-02-03 ENCOUNTER — Other Ambulatory Visit (INDEPENDENT_AMBULATORY_CARE_PROVIDER_SITE_OTHER): Payer: Medicare Other

## 2017-02-03 DIAGNOSIS — C946 Myelodysplastic disease, not classified: Secondary | ICD-10-CM

## 2017-02-03 DIAGNOSIS — D471 Chronic myeloproliferative disease: Secondary | ICD-10-CM

## 2017-02-03 DIAGNOSIS — K55069 Acute infarction of intestine, part and extent unspecified: Secondary | ICD-10-CM | POA: Diagnosis not present

## 2017-02-04 LAB — CBC WITH DIFFERENTIAL/PLATELET
Basophils Absolute: 0.1 10*3/uL (ref 0.0–0.2)
Basos: 1 %
EOS (ABSOLUTE): 0.3 10*3/uL (ref 0.0–0.4)
Eos: 2 %
Hematocrit: 44.5 % (ref 34.0–46.6)
Hemoglobin: 13.8 g/dL (ref 11.1–15.9)
Immature Grans (Abs): 0.2 10*3/uL — ABNORMAL HIGH (ref 0.0–0.1)
Immature Granulocytes: 1 %
Lymphocytes Absolute: 1.8 10*3/uL (ref 0.7–3.1)
Lymphs: 15 %
MCH: 23.9 pg — ABNORMAL LOW (ref 26.6–33.0)
MCHC: 31 g/dL — ABNORMAL LOW (ref 31.5–35.7)
MCV: 77 fL — ABNORMAL LOW (ref 79–97)
Monocytes Absolute: 0.6 10*3/uL (ref 0.1–0.9)
Monocytes: 5 %
Neutrophils Absolute: 9.1 10*3/uL — ABNORMAL HIGH (ref 1.4–7.0)
Neutrophils: 76 %
Platelets: 699 10*3/uL — ABNORMAL HIGH (ref 150–379)
RBC: 5.77 x10E6/uL — ABNORMAL HIGH (ref 3.77–5.28)
RDW: 15.2 % (ref 12.3–15.4)
WBC: 12.1 10*3/uL — ABNORMAL HIGH (ref 3.4–10.8)

## 2017-02-05 ENCOUNTER — Telehealth: Payer: Self-pay | Admitting: *Deleted

## 2017-02-05 NOTE — Telephone Encounter (Signed)
Called pt - no answer; left message "platelets [phone],000. Will discuss at time of vist next week" per Dr Beryle Beams which will be Tuesday next week.  And call for any questions.

## 2017-02-05 NOTE — Telephone Encounter (Signed)
-----   Message from Annia Belt, MD sent at 02/04/2017  7:28 AM EST ----- Call pt: platelets [phone],000. Will discuss at time of vist next week

## 2017-02-10 ENCOUNTER — Ambulatory Visit (INDEPENDENT_AMBULATORY_CARE_PROVIDER_SITE_OTHER): Payer: Medicare Other | Admitting: Oncology

## 2017-02-10 ENCOUNTER — Other Ambulatory Visit: Payer: Self-pay

## 2017-02-10 ENCOUNTER — Encounter: Payer: Self-pay | Admitting: Oncology

## 2017-02-10 VITALS — BP 124/75 | HR 62 | Temp 97.8°F | Ht 61.0 in | Wt 105.3 lb

## 2017-02-10 DIAGNOSIS — Z880 Allergy status to penicillin: Secondary | ICD-10-CM | POA: Diagnosis not present

## 2017-02-10 DIAGNOSIS — Z882 Allergy status to sulfonamides status: Secondary | ICD-10-CM

## 2017-02-10 DIAGNOSIS — Z7982 Long term (current) use of aspirin: Secondary | ICD-10-CM

## 2017-02-10 DIAGNOSIS — E611 Iron deficiency: Secondary | ICD-10-CM | POA: Diagnosis not present

## 2017-02-10 DIAGNOSIS — Z170 Estrogen receptor positive status [ER+]: Secondary | ICD-10-CM | POA: Diagnosis not present

## 2017-02-10 DIAGNOSIS — D473 Essential (hemorrhagic) thrombocythemia: Secondary | ICD-10-CM | POA: Diagnosis present

## 2017-02-10 DIAGNOSIS — Z79811 Long term (current) use of aromatase inhibitors: Secondary | ICD-10-CM

## 2017-02-10 DIAGNOSIS — Z7901 Long term (current) use of anticoagulants: Secondary | ICD-10-CM

## 2017-02-10 DIAGNOSIS — D471 Chronic myeloproliferative disease: Secondary | ICD-10-CM | POA: Diagnosis not present

## 2017-02-10 DIAGNOSIS — Z9221 Personal history of antineoplastic chemotherapy: Secondary | ICD-10-CM

## 2017-02-10 DIAGNOSIS — K55059 Acute (reversible) ischemia of intestine, part and extent unspecified: Secondary | ICD-10-CM | POA: Diagnosis not present

## 2017-02-10 DIAGNOSIS — C50912 Malignant neoplasm of unspecified site of left female breast: Secondary | ICD-10-CM | POA: Diagnosis not present

## 2017-02-10 DIAGNOSIS — I10 Essential (primary) hypertension: Secondary | ICD-10-CM

## 2017-02-10 DIAGNOSIS — Z87891 Personal history of nicotine dependence: Secondary | ICD-10-CM | POA: Diagnosis not present

## 2017-02-10 DIAGNOSIS — Z96642 Presence of left artificial hip joint: Secondary | ICD-10-CM

## 2017-02-10 DIAGNOSIS — Z923 Personal history of irradiation: Secondary | ICD-10-CM

## 2017-02-10 DIAGNOSIS — K9049 Malabsorption due to intolerance, not elsewhere classified: Secondary | ICD-10-CM | POA: Diagnosis not present

## 2017-02-10 DIAGNOSIS — K55069 Acute infarction of intestine, part and extent unspecified: Secondary | ICD-10-CM

## 2017-02-10 DIAGNOSIS — Z853 Personal history of malignant neoplasm of breast: Secondary | ICD-10-CM | POA: Diagnosis not present

## 2017-02-10 MED ORDER — HYDROXYUREA 500 MG PO CAPS: 500.0000 mg | ORAL_CAPSULE | Freq: Every day | ORAL | 3 refills | Status: DC

## 2017-02-10 NOTE — Progress Notes (Signed)
Hematology and Oncology Follow Up Visit  Gwendolyn Williams [id number] 11/08/1940 76 y.o. 02/10/2017 6:53 PM   Principle Diagnosis: Encounter Diagnoses  Name Primary?  . 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 Myeloproliferative disorder ([place]) Yes  . 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 She has been on full dose Coumadin anticoagulation and low-dose aspirin. She was recently changed to [place] in July 2017. She made last-minute plans to travel.She was not going to be able to get Coumadin levels checked. I decided to transition her to the Xarelto based on evolving experience with the new oral anticoagulants with respect to decreased bleeding in patients on long-term anticoagulation compared with Coumadin and increasing experience with unusual site thrombotic events.   She was diagnosed with a stage I, ER/PR positive, HER-2-negative, cancer of the left breast in August 2010. Oncotype DX score high risk. She underwent left lumpectomy on 12/26/2008 followed by adjuvant chemotherapy with 4 cycles of Taxotere plus Cytoxan between 02/16/2009 and 04/20/2009. She then had a course of radiation between February 14 and 07/02/2009. She was started on hormonal therapy with Arimidex which she continues at this time. BRCA1 and BRCA2 gene testing was negative.  She developed a large chest wall hematoma at time of the surgery related to tight anticoagulation which has long since resolved. She has one son. One daughter age 90(2016).  Interim History: Extended visit to discuss initiation of hydroxyurea therapy to lower her platelet count.  Following iron replacement, hemoglobin has normalized.  Platelet count initially felt back to  her baseline but is now steadily rising and was 699,000 in anticipation of today's visit on November 6.  Although she is protected against clotting on aspirin and Xarelto, in view of her advancing age and history of arterial thrombosis, I think it is time to begin platelet lowering therapy.  We discussed that there are no good studies to show that there is an optimal platelet count in patients with essential thrombocythemia.  The platelets are dysfunctional and may still cause problems even when the numbers on paper appear within normal range.  I have tried to keep patients less than 500,000.  We discussed the potential downsides of the Hydrea therapy primarily relating to some patients who have excessive suppression of the hemoglobin and white count but these patients are rare.  There may be some mild GI intolerance and she already has GI issues.  In general there is no hair loss or minimal hair thinning.  Rarely, cutaneous ulcers of the lower extremities can occur.  I have seen this primarily in my sickle cell patients.  I have never seen it in other populations.  Her husband accompanies her today.  He is a retired Software engineer.  We talked about alternative medications.  Main alternative is an anagrelide which I feel is a excellent drug but is not favored by some experts for unclear reasons.  In the absence of any underlying cardiac disease or arrhythmias, it is a good drug. We talked about the previous hypophysis that Hydrea increased risk for conversion to acute leukemia.  This has just not been borne out clinically or in direct observation of patients on multiple clinical trials including younger patients with sickle cell disease who have started the drug and have been on treatment for many years.  We discussed that essential  thrombocythemia is a stem cell disorder so there is a finite chance that it will convert to a more aggressive blood disorder such as leukemia over time.  His conversion has been 5% or  less. I will plan to start at 500 mg daily and titrate dose to keep platelet count less than 500,000.  She has no neurologic symptoms at this time.  No headache.  No change in vision.  No focal weakness.  She is not having any abdominal pain or bleeding symptoms.  No cardiorespiratory symptoms.  Since last visit with me she underwent successful left hip replacement on October 07, 2016.  Medications: reviewed  Allergies:  Allergies  Allergen Reactions  . Penicillins Hives    Has patient had a PCN reaction causing immediate rash, facial/tongue/throat swelling, SOB or lightheadedness with hypotension: Unknown Has patient had a PCN reaction causing severe rash involving mucus membranes or skin necrosis: Unknown Has patient had a PCN reaction that required hospitalization: No Has patient had a PCN reaction occurring within the last 10 years: No If all of the above answers are "NO", then may proceed with Cephalosporin use.   . Sulfa Antibiotics Other (See Comments)    unknown    Review of Systems: See interim history Remaining ROS negative:   Physical Exam: Blood pressure 124/75, pulse 62, temperature 97.8 F (36.6 C), temperature source Oral, height _0  (1.549 m), weight 105 lb 4.8 oz (47.8 kg), SpO2 100 %. Wt Readings from Last 3 Encounters:  02/10/17 105 lb 4.8 oz (47.8 kg)  10/07/16 101 lb (45.8 kg)  09/09/16 102 lb 12.8 oz (46.6 kg)     General appearance: Petite Caucasian woman [name]: Pharynx no erythema, exudate, mass, or ulcer. No thyromegaly or thyroid nodules Lymph nodes: No cervical, supraclavicular, or axillary lymphadenopathy Breasts: Not examined today Lungs: Clear to auscultation, resonant to percussion throughout Heart: Regular rhythm, no murmur, no gallop, no rub, no click, no edema Abdomen: Soft, nontender, normal bowel sounds, no mass, no organomegaly Extremities: No edema, no calf tenderness Musculoskeletal: no joint deformities GU:  Vascular: Carotid pulses  2+, no bruits, distal pulses: Dorsalis pedis 1+ symmetric Neurologic: Alert, oriented, PERRLA, optic discs sharp and vessels normal, no hemorrhage or exudate, cranial nerves grossly normal, motor strength 5 over 5, reflexes 1+ symmetric, upper body coordination normal, gait normal, Skin: No rash or ecchymosis  Lab Results: CBC W/Diff    Component Value Date/Time   WBC 12.1 (H) 02/03/2017 0959   WBC 12.3 (H) 10/09/2016 0509   RBC 5.77 (H) 02/03/2017 0959   RBC 3.44 (L) 10/09/2016 0509   HGB 13.8 02/03/2017 0959   HGB 11.8 12/08/2014 1540   HCT 44.5 02/03/2017 0959   HCT 36.8 12/08/2014 1540   PLT 699 (H) 02/03/2017 0959   MCV 77 (L) 02/03/2017 0959   MCV 72.0 (L) 12/08/2014 1540   MCH 23.9 (L) 02/03/2017 0959   MCH 27.3 10/09/2016 0509   MCHC 31.0 (L) 02/03/2017 0959   MCHC 32.9 10/09/2016 0509   RDW 15.2 02/03/2017 0959   RDW 17.1 (H) 12/08/2014 1540   LYMPHSABS 1.8 02/03/2017 0959   LYMPHSABS 1.4 12/08/2014 1540   MONOABS 0.6 12/08/2014 1540   EOSABS 0.3 02/03/2017 0959   BASOSABS 0.1 02/03/2017 0959   BASOSABS 0.1 12/08/2014 1540     Chemistry      Component Value Date/Time   NA 138 10/09/2016 0509   NA 142 09/01/2016 1032   NA 137 12/08/2014 1540   K 3.5 10/09/2016  0509   K 4.2 12/08/2014 1540   CL 106 10/09/2016 0509   CL 105 08/03/2012 1529   CO2 28 10/09/2016 0509   CO2 25 12/08/2014 1540   BUN 20 10/09/2016 0509   BUN 25 09/01/2016 1032   BUN 27.4 (H) 12/08/2014 1540   CREATININE 0.94 10/09/2016 0509   CREATININE 1.1 12/08/2014 1540      Component Value Date/Time   CALCIUM 8.5 (L) 10/09/2016 0509   CALCIUM 9.9 12/08/2014 1540   ALKPHOS 50 09/01/2016 1032   ALKPHOS 42 12/08/2014 1540   AST 13 09/01/2016 1032   AST 23 12/08/2014 1540   ALT 13 09/01/2016 1032   ALT 21 12/08/2014 1540   BILITOT 0.5 09/01/2016 1032   BILITOT 0.65 12/08/2014 1540       Radiological Studies: No results found.  Impression:  1.  Myeloproliferative disorder-essential  thrombocythemia Plan: Begin hydroxyurea 500 mg daily and titrate to platelet count less than 500,000.  See discussion above.  2.  Mesenteric vascular thrombosis as initial sign of #1  3.  Chronic anticoagulation and antiplatelet therapy in view of #1 and #2  4.  Stage I ER positive, HER-2 negative, Oncotype DX high risk left breast cancer with ongoing hormonal therapy with Arimidex with plan to continue through April 2021.  5.  Degenerative arthritis now status post left hip replacement July 2018  6.  Essential hypertension  7.  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

## 2017-02-10 NOTE — Patient Instructions (Signed)
To lab today  Begin Hydrea (Hydroxyurea) 500 mg 1 capsule with food daily OK to take a Zofran 30 minutes before the Hydrea for the first week or 2 until we see how the Hydrea affects your GI tract Repeat lab in 2 weeks then monthly or as instructed by MD Return visit 6-8 weeks

## 2017-02-11 LAB — CBC WITH DIFFERENTIAL/PLATELET
Basophils Absolute: 0.1 10*3/uL (ref 0.0–0.2)
Basos: 1 %
EOS (ABSOLUTE): 0.3 10*3/uL (ref 0.0–0.4)
Eos: 3 %
Hematocrit: 42.4 % (ref 34.0–46.6)
Hemoglobin: 13.2 g/dL (ref 11.1–15.9)
Immature Grans (Abs): 0.1 10*3/uL (ref 0.0–0.1)
Immature Granulocytes: 1 %
Lymphocytes Absolute: 1.4 10*3/uL (ref 0.7–3.1)
Lymphs: 14 %
MCH: 23.8 pg — ABNORMAL LOW (ref 26.6–33.0)
MCHC: 31.1 g/dL — ABNORMAL LOW (ref 31.5–35.7)
MCV: 77 fL — ABNORMAL LOW (ref 79–97)
Monocytes Absolute: 0.6 10*3/uL (ref 0.1–0.9)
Monocytes: 6 %
Neutrophils Absolute: 7.8 10*3/uL — ABNORMAL HIGH (ref 1.4–7.0)
Neutrophils: 75 %
Platelets: 655 10*3/uL — ABNORMAL HIGH (ref 150–379)
RBC: 5.54 x10E6/uL — ABNORMAL HIGH (ref 3.77–5.28)
RDW: 15.3 % (ref 12.3–15.4)
WBC: 10.1 10*3/uL (ref 3.4–10.8)

## 2017-02-11 LAB — COMPREHENSIVE METABOLIC PANEL
ALT: 14 IU/L (ref 0–32)
AST: 16 IU/L (ref 0–40)
Albumin/Globulin Ratio: 1.9 (ref 1.2–2.2)
Albumin: 4.2 g/dL (ref 3.5–4.8)
Alkaline Phosphatase: 63 IU/L (ref 39–117)
BUN/Creatinine Ratio: 24 (ref 12–28)
BUN: 25 mg/dL (ref 8–27)
Bilirubin Total: 0.4 mg/dL (ref 0.0–1.2)
CO2: 24 mmol/L (ref 20–29)
Calcium: 9.5 mg/dL (ref 8.7–10.3)
Chloride: 102 mmol/L (ref 96–106)
Creatinine, Ser: 1.03 mg/dL — ABNORMAL HIGH (ref 0.57–1.00)
GFR calc Af Amer: 62 mL/min/{1.73_m2} (ref >59)
GFR calc non Af Amer: 54 mL/min/{1.73_m2} — ABNORMAL LOW (ref >59)
Globulin, Total: 2.2 g/dL (ref 1.5–4.5)
Glucose: 70 mg/dL (ref 65–99)
Potassium: 4.3 mmol/L (ref 3.5–5.2)
Sodium: 143 mmol/L (ref 134–144)
Total Protein: 6.4 g/dL (ref 6.0–8.5)

## 2017-02-11 LAB — URIC ACID: Uric Acid: 6 mg/dL (ref 2.5–7.1)

## 2017-02-11 LAB — LACTATE DEHYDROGENASE: LDH: 317 IU/L — ABNORMAL HIGH (ref 119–226)

## 2017-02-13 ENCOUNTER — Telehealth: Payer: Self-pay | Admitting: *Deleted

## 2017-02-13 NOTE — Telephone Encounter (Signed)
appt OK. She can call if any problems with the Hydrea and we will be checking cbc every 2 weeks until on a stable dose

## 2017-02-13 NOTE — Telephone Encounter (Signed)
Pt called / informed "platelets about the same @ 655,000. LDH chronic, mild, elevation - is a sign of increased cell growth associated with the essential thrombocythemia." per Dr Beryle Beams. Also result given to husband per pt's request. Pt next appt is Jan 22 to see Dr Darnell Level - wants to know if this is ok? (instructed to come back in 6-8 weeks)

## 2017-02-13 NOTE — Telephone Encounter (Signed)
-----   Message from Annia Belt, MD sent at 02/11/2017 10:04 AM EST ----- Call pt: platelets about the same @ 655,000. LDH chronic, mild, elevation - is a sign of increased cell growth associated with the essential thrombocythemia.

## 2017-02-13 NOTE — Telephone Encounter (Signed)
Pt called / informed Jan 22 appt is ok per Dr Darnell Level and to call if she has any problems w/Hydrea. Stated she has been taking Zofran along w/Hydrea and so far ok but will call if she has any problems.

## 2017-02-14 DIAGNOSIS — Z23 Encounter for immunization: Secondary | ICD-10-CM | POA: Diagnosis not present

## 2017-02-24 ENCOUNTER — Encounter: Payer: Self-pay | Admitting: Oncology

## 2017-02-24 ENCOUNTER — Other Ambulatory Visit: Payer: Self-pay

## 2017-02-24 ENCOUNTER — Ambulatory Visit (INDEPENDENT_AMBULATORY_CARE_PROVIDER_SITE_OTHER): Payer: Medicare Other | Admitting: Oncology

## 2017-02-24 ENCOUNTER — Other Ambulatory Visit: Payer: Medicare Other

## 2017-02-24 VITALS — BP 124/75 | HR 72 | Temp 97.4°F | Ht 61.0 in

## 2017-02-24 DIAGNOSIS — K55069 Acute infarction of intestine, part and extent unspecified: Secondary | ICD-10-CM | POA: Diagnosis not present

## 2017-02-24 DIAGNOSIS — R0609 Other forms of dyspnea: Secondary | ICD-10-CM | POA: Insufficient documentation

## 2017-02-24 DIAGNOSIS — R0600 Dyspnea, unspecified: Secondary | ICD-10-CM

## 2017-02-24 DIAGNOSIS — D471 Chronic myeloproliferative disease: Secondary | ICD-10-CM

## 2017-02-24 HISTORY — DX: Dyspnea, unspecified: R06.00

## 2017-02-24 LAB — CBC WITH DIFFERENTIAL/PLATELET
Basophils Absolute: 0.1 10*3/uL (ref 0.0–0.1)
Basophils Relative: 1 %
Eosinophils Absolute: 0.2 10*3/uL (ref 0.0–0.7)
Eosinophils Relative: 3 %
HCT: 42.4 % (ref 36.0–46.0)
Hemoglobin: 13 g/dL (ref 12.0–15.0)
Lymphocytes Relative: 15 %
Lymphs Abs: 1.2 10*3/uL (ref 0.7–4.0)
MCH: 24.2 pg — ABNORMAL LOW (ref 26.0–34.0)
MCHC: 30.7 g/dL (ref 30.0–36.0)
MCV: 78.8 fL (ref 78.0–100.0)
Monocytes Absolute: 0.2 10*3/uL (ref 0.1–1.0)
Monocytes Relative: 3 %
Neutro Abs: 6.1 10*3/uL (ref 1.7–7.7)
Neutrophils Relative %: 78 %
Platelets: 467 10*3/uL — ABNORMAL HIGH (ref 150–400)
RBC: 5.38 MIL/uL — ABNORMAL HIGH (ref 3.87–5.11)
RDW: 17.7 % — ABNORMAL HIGH (ref 11.5–15.5)
WBC: 7.8 10*3/uL (ref 4.0–10.5)

## 2017-02-24 NOTE — Progress Notes (Signed)
Hematology and Oncology Follow Up Visit  Gwendolyn Williams [id number] 02-02-1941 76 y.o. 02/24/2017 11:35 AM   Principle Diagnosis: Encounter Diagnoses  Name Primary?  Marland Kitchen Dyspnea, unspecified type   . Myeloproliferative disorder ([place]) Yes  . 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  76 year old woman on chronic anticoagulation and low-dose aspirin status post mesenteric vascular thrombosis found secondary to underlying myeloproliferative disorder, essential thrombocythemia.  Platelet count has fluctuated over time but recently trend has been for persistent elevation despite repletion of iron stores.  I just saw her on November 13.  Please refer to that note for complete details of her history.  I elected to start platelet lowering therapy at that time with hydroxyurea 500 mg daily.  Coincident with this.  She has noted the indolent onset of dyspnea with decreased exercise tolerance at the gym and episodes of dyspnea on minimal exertion when she gets up from a sitting or lying position and walks across the room.  No dyspnea at rest.  No pleuritic chest pain.  No palpitations.  No chest pressure.  No ankle swelling.  She looked up the potential side effects of Hydrea and dyspnea listed as  although in my experience of using this drug for over 30 years, I have never heard this complaint.  She has no known underlying cardiopulmonary condition.  She is a never smoker.    Medications: reviewed  Allergies:  Allergies  Allergen Reactions  . Penicillins Hives    Has patient had a PCN reaction causing immediate rash, facial/tongue/throat swelling, SOB or lightheadedness with hypotension: Unknown Has patient had a PCN reaction causing severe rash involving mucus membranes or skin necrosis: Unknown Has patient had a PCN reaction that required hospitalization: No Has patient had a PCN reaction occurring within the last 10 years: No If all of the above answers are "NO", then may  proceed with Cephalosporin use.   . Sulfa Antibiotics Other (See Comments)    unknown    Review of Systems: See interim history Remaining ROS negative:   Physical Exam: Blood pressure 124/75, pulse 72, temperature (!) 97.4 F (36.3 C), temperature source Oral, height 5\' 1"  (1.549 m), SpO2 100 %. Wt Readings from Last 3 Encounters:  02/10/17 105 lb 4.8 oz (47.8 kg)  10/07/16 101 lb (45.8 kg)  09/09/16 102 lb 12.8 oz (46.6 kg)   Focused exam: General appearance: Petite Caucasian woman [name]: Pharynx no erythema, exudate, mass, or ulcer. No thyromegaly or thyroid nodules Lymph nodes:  Breasts:  Lungs: Clear to auscultation, resonant to percussion throughout Heart: Regular rhythm, no murmur, no gallop, no rub, no click, no edema Abdomen:  Extremities: No edema, no calf tenderness Musculoskeletal:  GU:  Vascular: Carotid pulses 2+, no bruits, distal pulses:  Neurologic: Alert, oriented, Skin:   Lab Results: CBC W/Diff    Component Value Date/Time   WBC 7.8 02/24/2017 1007   RBC 5.38 (H) 02/24/2017 1007   HGB 13.0 02/24/2017 1007   HGB 13.2 02/10/2017 1028   HGB 11.8 12/08/2014 1540   HCT 42.4 02/24/2017 1007   HCT 42.4 02/10/2017 1028   HCT 36.8 12/08/2014 1540   PLT 467 (H) 02/24/2017 1007   PLT 655 (H) 02/10/2017 1028   MCV 78.8 02/24/2017 1007   MCV 77 (L) 02/10/2017 1028   MCV 72.0 (L) 12/08/2014 1540   MCH 24.2 (L) 02/24/2017 1007   MCHC 30.7 02/24/2017 1007   RDW 17.7 (H) 02/24/2017 1007  RDW 15.3 02/10/2017 1028   RDW 17.1 (H) 12/08/2014 1540   LYMPHSABS 1.2 02/24/2017 1007   LYMPHSABS 1.4 02/10/2017 1028   LYMPHSABS 1.4 12/08/2014 1540   MONOABS 0.2 02/24/2017 1007   MONOABS 0.6 12/08/2014 1540   EOSABS 0.2 02/24/2017 1007   EOSABS 0.3 02/10/2017 1028   BASOSABS 0.1 02/24/2017 1007   BASOSABS 0.1 02/10/2017 1028   BASOSABS 0.1 12/08/2014 1540     Chemistry      Component Value Date/Time   NA 143 02/10/2017 1028   NA 137 12/08/2014 1540   K  4.3 02/10/2017 1028   K 4.2 12/08/2014 1540   CL 102 02/10/2017 1028   CL 105 08/03/2012 1529   CO2 24 02/10/2017 1028   CO2 25 12/08/2014 1540   BUN 25 02/10/2017 1028   BUN 27.4 (H) 12/08/2014 1540   CREATININE 1.03 (H) 02/10/2017 1028   CREATININE 1.1 12/08/2014 1540      Component Value Date/Time   CALCIUM 9.5 02/10/2017 1028   CALCIUM 9.9 12/08/2014 1540   ALKPHOS 63 02/10/2017 1028   ALKPHOS 42 12/08/2014 1540   AST 16 02/10/2017 1028   AST 23 12/08/2014 1540   ALT 14 02/10/2017 1028   ALT 21 12/08/2014 1540   BILITOT 0.4 02/10/2017 1028   BILITOT 0.65 12/08/2014 1540    Lab today: Hemoglobin 13, hematocrit 42, white count 7800, platelets 467,000   Radiological Studies: No results found.  Impression:  1.  Unexplained dyspnea on exertion coincidental with starting hydroxyurea to control essential thrombocythemia. Not clear why she is having this effect.  Blood pressure and oxygenation are normal.  Physical exam normal.  No significant drop in her hemoglobin from baseline. I would like to continue the hydroxyurea for now to see if her body adjust to it.  If symptoms persist or progress then I will stop the drug and use an alternative platelet lowering drug-anagrelide.  We discussed this today along with her husband who accompanied her.  2.  Myeloproliferative disorder-essential thrombocythemia Nice early response to [facility] which is a reason that I would like to continue it if it is tolerated.  CC: Patient Care Team: Gaynelle Arabian, MD as PCP - General (Family Medicine) Annia Belt, MD as Consulting Physician (Oncology) Rolm Bookbinder, MD as Consulting Physician (General Surgery)   Murriel Hopper, MD, FACP  Hematology-Oncology/Internal Medicine     11/27/201811:35 AM

## 2017-02-24 NOTE — Patient Instructions (Signed)
Continue Hydrea for now Keep me updated if symptoms persist or worsen We will call with lab results

## 2017-02-24 NOTE — Addendum Note (Signed)
Addended by: Truddie Crumble on: 02/24/2017 10:07 AM   Modules accepted: Orders

## 2017-03-02 ENCOUNTER — Ambulatory Visit (HOSPITAL_BASED_OUTPATIENT_CLINIC_OR_DEPARTMENT_OTHER): Payer: Medicare Other | Admitting: Genetics

## 2017-03-02 ENCOUNTER — Encounter: Payer: Medicare Other | Admitting: Gynecology

## 2017-03-02 DIAGNOSIS — Z803 Family history of malignant neoplasm of breast: Secondary | ICD-10-CM

## 2017-03-02 DIAGNOSIS — Z853 Personal history of malignant neoplasm of breast: Secondary | ICD-10-CM | POA: Diagnosis present

## 2017-03-03 ENCOUNTER — Other Ambulatory Visit: Payer: Medicare Other

## 2017-03-03 ENCOUNTER — Encounter: Payer: Self-pay | Admitting: Genetics

## 2017-03-03 DIAGNOSIS — Z803 Family history of malignant neoplasm of breast: Secondary | ICD-10-CM | POA: Diagnosis not present

## 2017-03-03 DIAGNOSIS — K55069 Acute infarction of intestine, part and extent unspecified: Secondary | ICD-10-CM

## 2017-03-03 DIAGNOSIS — Z853 Personal history of malignant neoplasm of breast: Secondary | ICD-10-CM | POA: Diagnosis not present

## 2017-03-03 DIAGNOSIS — K909 Intestinal malabsorption, unspecified: Secondary | ICD-10-CM

## 2017-03-03 DIAGNOSIS — D471 Chronic myeloproliferative disease: Secondary | ICD-10-CM

## 2017-03-03 NOTE — Progress Notes (Signed)
REFERRING PROVIDER: Self- daughter   PRIMARY PROVIDER:  Gaynelle Arabian, MD  PRIMARY REASON FOR VISIT:  1. History of breast cancer in female   2. Family history of breast cancer      HISTORY OF PRESENT ILLNESS:   Gwendolyn Williams, a 76 y.o. female, was seen for a [place] cancer genetics consultation with her daughter due to a personal and family history of breast cancer.  Gwendolyn Williams presents to clinic today to discuss the possibility of a hereditary predisposition to cancer, genetic testing, and to further clarify her future cancer risks, as well as potential cancer risks for family members.   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  She underwent left lumpectomy followed by adjuvant chemotherapy, radiation, and anti estrogen therapy.  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  She has a  myeloproliferative disorder.   In 2010 Gwendolyn Williams had Multi-site (3 site BRCA1/2) genetic testing.  The results from this test were reportedly negative- the report is currently unavailable for review.    HORMONAL RISK FACTORS:  Ovaries intact: yes.  Menopausal status: postmenopausal.    Past Medical History:  Diagnosis Date  . Arthritis   . Asthma    controlled with singulair  . Breast cancer ([place])    stage I left breast  . Dyspnea, unspecified 02/24/2017   Started coincident with Hydrea, exertional and orthostatic, normal exam. O2 sat 100%.  . Family history of breast cancer   . GERD (gastroesophageal reflux disease)   . Hematoma of arm 08/09/2012  . 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 Hyperlipidemia   . Hypertension   . 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 Pneumonia    viral pneumonia as a kid  . PONV (postoperative nausea and  vomiting)     Past Surgical History:  Procedure Laterality Date  . BREAST SURGERY     Left breast lumpectomy sentinel node biopsy  . CERVICAL SPINE SURGERY  05/23/2008  . COLECTOMY  04/11/2008   left colectomy with end colostomy due to clot  . COLOSTOMY TAKEDOWN  08/14/2008  . EYE SURGERY     bil cataracts  . HERNIA REPAIR  2011   [facility]  . [place] SURGERY  2006  . TOTAL HIP ARTHROPLASTY Left 10/07/2016   Procedure: LEFT TOTAL HIP ARTHROPLASTY ANTERIOR APPROACH;  Surgeon: Paralee Cancel, MD;  Location: [facility];  Service: Orthopedics;  Laterality: Left;  70 mins    Social History   Socioeconomic History  . Marital status: Married    Spouse name: None  . Number of children: None  . Years of education: None  . Highest education level: None  Social Needs  . Financial resource strain: None  . Food insecurity - worry: None  . Food insecurity - inability: None  . Transportation needs - medical: None  . Transportation needs - non-medical: None  Occupational History  . None  Tobacco Use  . Smoking status: Former Smoker    Last attempt to quit: 01/06/1964    Years since quitting: 53.1  . Smokeless tobacco: Never Used  Substance and Sexual Activity  . Alcohol use: Yes    Alcohol/week: 0.0 oz    Comment: once in a while  .  Drug use: No  . Sexual activity: Yes    Comment: 1st intercourse 76 yo-Fewer than 5 partners  Other Topics Concern  . None  Social History Narrative  . None     FAMILY HISTORY:  We obtained a detailed, 4-generation family history.  Significant diagnoses are listed below: Family History  Problem Relation Age of Onset  . Breast cancer Mother 5  . Lung cancer Mother   . Heart disease Father   . Basal cell carcinoma Father        x2  . Ovarian cysts Sister   . Breast cancer Maternal Aunt 63  . Breast cancer Paternal Aunt 25  . Ovarian cysts Maternal Grandmother        possibly had ovarian cancer as well  . Breast cancer Maternal Aunt 25  . Cancer  Other        either pancreatic or colon  . Other Daughter 24       breast lumpectomy- complex sclerosing lesion  . Polycystic ovary syndrome Daughter    Gwendolyn Williams has a 37 year-old daughter who recently had a lumpectomy for a complex sclerosing lesion.  This daughter has a 63 year-old daughter.   Gwendolyn Williams and her daughter came together for genetic counseling.  Gwendolyn Williams also has a 25 year-old son with no history of cancer.  Gwendolyn Williams has a sister who has no history of cancer.    Gwendolyn Williams father died at 24 and had a history of 2 basal cell carcinomas. Gwendolyn Williams has several uncles with no history of cancer and a paternal aunt who had a history of breast cancer in her 6's (died at 52). Gwendolyn Williams paternal grandparents have no known history of cancer and were cousins (she is unsure if 1st or 2nd cousins).  Gwendolyn Williams paternal grandfather's mother had either colon or pancreatic cancer.    Gwendolyn Williams mother was diagnosed with breast cancer in her early 33's and lung cancer in her 39's.  Gwendolyn Williams has 3 maternal aunts, 2 of which developed breast caner (about age 9?).  Gwendolyn Williams also has 2 maternal uncles with no history of cancer.  Gwendolyn Williams has several cousins with no history of cancer, but one maternal cousin died of bladder cancer.  Gwendolyn Williams maternal grandparents died at 'young ages'.  She reports that her maternal grandmother had ovarian cysts, and possibly had ovarian cancer.    Ms. Kreiter is unaware of previous family history of genetic testing for hereditary cancer risks. Patient's maternal ancestors are of Ashkenazi [place] descent, and paternal ancestors are of Ashkenazi Jewish descent. There is no reported Ashkenazi Jewish ancestry. There consanguinity of her paternal grandparents (unsure if they were cousins or 2nd cousins).  GENETIC COUNSELING ASSESSMENT: Gwendolyn Williams is a 76 y.o. female with a personal and family which is somewhat suggestive of a Hereditary Cancer  Predisposition Syndrome. We, therefore, discussed and recommended the following at today's visit.   DISCUSSION: We reviewed the characteristics, features and inheritance patterns of hereditary cancer syndromes. We also discussed genetic testing, including the appropriate family members to test, the process of testing, insurance coverage and turn-around-time for results. We discussed the implications of a negative, positive and/or variant of uncertain significant result. We recommended Ms. Schamberger pursue genetic testing for the Common Hereditary Cancer gene panel. The Hereditary Gene Panel offered by [facility] includes sequencing and/or deletion duplication testing of the following 47 genes: APC, ATM, AXIN2, BARD1, BMPR1A, BRCA1, BRCA2, BRIP1, CDH1, CDKN2A (  p14ARF), CDKN2A (p16INK4a), CKD4, CHEK2, CTNNA1, DICER1, EPCAM (Deletion/duplication testing only), GREM1 (promoter region deletion/duplication testing only), KIT, MEN1, MLH1, MSH2, MSH3, MSH6, MUTYH, NBN, NF1, NHTL1, PALB2, PDGFRA, PMS2, POLD1, POLE, PTEN, RAD50, RAD51C, RAD51D, SDHB, SDHC, SDHD, SMAD4, SMARCA4. STK11, TP53, TSC1, TSC2, and VHL.  The following genes were evaluated for sequence changes only: SDHA and HOXB13 c.251G>A variant only.  We discussed that only 5-10% of cancers are associated with a Hereditary cancer predisposition syndrome.  One of the most common hereditary cancer syndromes that increases breast cancer risk is called Hereditary Breast and Ovarian Cancer (HBOC) syndrome.  This syndrome is caused by mutations in the BRCA1 and BRCA2 genes.  This syndrome increases an individual's lifetime risk to develop breast, ovarian, pancreatic, and other types of cancer.  There are also many other cancer predisposition syndromes caused by mutations in several other genes.  We discussed that if she is found to have a mutation in one of these genes, it may impact future medical management recommendations such as increased cancer screenings and  consideration of risk reducing surgeries.  A positive result could also have implications for the patient's family members.  A Negative result would mean we were unable to identify a hereditary component to her cancer, but does not rule out the possibility of a hereditary basis for her  cancer.  There could be mutations that are undetectable by current technology, or in genes not yet tested or identified to increase cancer risk.    We discussed the potential to find a Variant of Uncertain Significance or VUS.  These are variants that have not yet been identified as pathogenic or benign, and it is unknown if this variant is associated with increased cancer risk or if this is a normal finding.  Most VUS's are reclassified to benign or likely benign.   It should not be used to make medical management decisions. With time, we suspect the lab will determine the significance of any VUS's identified if any.   Based on Ms. Ziemba personal and family history of cancer, she meets medical criteria for genetic testing. Despite that she meets criteria, she may still have an out of pocket cost. We discussed that if her out of pocket cost for testing is over $100, the laboratory will call and confirm whether she wants to proceed with testing.  If the out of pocket cost of testing is less than $100 she will be billed by the genetic testing laboratory.  We discussed that individuals with medicare insurance typically have $0 OOP cost.   We also discussed that her myeloproliferative disease may impact the results of her genetic testing.  When there are acquired mutations in the blood, genetic testing can pick up variants that are somatic as opposed to germline.  It is possible there will be a result that identifies somatic mutations as opposed to germline mutations.    PLAN: After considering the risks, benefits, and limitations, Ms. Oddo  provided informed consent to pursue genetic testing and [facility] of the Common Hereditary Cancer Panel. Results should be available within approximately 2-3 weeks' time, at which point they will be disclosed by telephone to Ms. Elem, as will any additional recommendations warranted by these results. Ms. Latendresse will receive a summary of her genetic counseling visit and a copy of her results once available. This information will also be available in Epic. We encouraged Ms. Saulsbury to remain in contact with cancer genetics  annually so that we can continuously update the family history and inform her of any changes in cancer genetics and testing that may be of benefit for her family. Ms. Knarr questions were answered to her satisfaction today. Our contact information was provided should additional questions or concerns arise.  Based on Ms. Scruggs family history, we recommended her sister and other maternal relatives have genetic counseling and testing. Ms. Hoge will let us know if we can be of any assistance in coordinating genetic counseling and/or testing for this family member.   Lastly, we encouraged Ms. Parkin to remain in contact with cancer genetics annually so that we can continuously update the family history and inform her of any changes in cancer genetics and testing that may be of benefit for this family.   Ms.  Dalporto questions were answered to her satisfaction today. Our contact information was provided should additional questions or concerns arise. Thank you for the referral and allowing [place] to share in the care of your patient.   Tana Felts, MS Genetic Counselor [email or web address] phone: [phone]  The patient was seen for a total of 60 minutes in face-to-face genetic counseling.  The patient was accompanied today by her daughter, Minahil Quinlivan.

## 2017-03-06 ENCOUNTER — Telehealth: Payer: Self-pay | Admitting: Genetics

## 2017-03-06 NOTE — Telephone Encounter (Signed)
12/5 Discussed with Gwendolyn Williams that after being able to review her records fully and check in with the lab about her myeloproliferative disorder, the laboratory has said that they will accept a blood sample, but that the accuracy could be affected by her myeloproliferative disorder.   There would be a possibility for false positives and false negatives using a blood sample due to the malignancy in her blood.  We could proceed with the testing of her blood with her understanding the results may not be as accurate as possible or we discussed the option to do testing on a skin biopsy sample.  The skin biopsy would have to be done at her dermatologists office and then would be sent to a lab to culture the cells before sending the sample to the lab for genetic testing.  Most patients with medicare insurance pay $0 out of pocket for the actual genetic testing/analysis, but I am unsure if insurance will pay for the procedure of the skin biopsy and for the culturing of the cells so there could be a larger out of pocket cost to do the testing with this sample.  However, the results will be accurate.   Gwendolyn Williams requested that I also explain these results with her husband and that it was okay to discuss this process with him.  She decided to proceed with the skin biopsy and was okay with the costs that could be associated with ordering this testing.  I told her I would reach out to her dermatologist to see if they would be willing to do this, and will call back when I have those details.  12/7 Left message with Ms. Zenker husband (previously gave consent to discuss this genetic testing issue with him).  Ms. Levier dermatologist Jari Pigg has agreed to do the skin biopsy and we will coordinate sending a kit to her office prior to the date of her appointment.  She should call Dr. Maurie Boettcher office to make the appointment-  I recommended that she make the appointment between [place] AM/early PM so that we could  send the sample out by Thursday afternoon and not run into any issues with shipping over the weekend.  She should call and let me know the date she schedules this appointment so that I can coordinate the testing kit and shipment.

## 2017-03-09 ENCOUNTER — Encounter: Payer: Medicare Other | Admitting: Gynecology

## 2017-03-12 ENCOUNTER — Telehealth: Payer: Self-pay | Admitting: Genetics

## 2017-03-12 NOTE — Telephone Encounter (Signed)
Returned patient's call.  She informed me that her skin biopsy appointment is scheduled for 12/19 at 11:20 am with Dr. Delman Cheadle.  I told her that I have reached out to the laboratory and they will be sending a kit to arrive on Monday or Tuesday- and then I will walk it across the street to her dermatologist's office before her appointment on Wed.  I can then pick it up and send it out.  She asked where on the body that the sample has to be taken/how much,  and I explained that it can be taken from any site of health skin and they request 2 80m sized skin punch biopsies.    The sample will be sent to the laboratory [facility] for culturing (this takes a couple of weeks) and they will isolate the DNA and send it the laboratory [facility] for DNA analysis.  Patients with medicare have no out of pocket cost for [facility] but there could be a cost for culturing the cells - I am not sure if her insurance will cover that or the appointment for a skin biopsy.  She expressed that she understood the plan and understands that there could be some out of pocket cost for this whole process.  I will call her with an update in a few weeks as to how her sample is progressing.

## 2017-03-18 DIAGNOSIS — Z1379 Encounter for other screening for genetic and chromosomal anomalies: Secondary | ICD-10-CM | POA: Diagnosis not present

## 2017-03-18 DIAGNOSIS — Z23 Encounter for immunization: Secondary | ICD-10-CM | POA: Diagnosis not present

## 2017-03-19 DIAGNOSIS — D471 Chronic myeloproliferative disease: Secondary | ICD-10-CM | POA: Diagnosis not present

## 2017-03-19 DIAGNOSIS — Z803 Family history of malignant neoplasm of breast: Secondary | ICD-10-CM | POA: Diagnosis not present

## 2017-03-19 DIAGNOSIS — Z853 Personal history of malignant neoplasm of breast: Secondary | ICD-10-CM | POA: Diagnosis not present

## 2017-04-06 ENCOUNTER — Telehealth: Payer: Self-pay | Admitting: *Deleted

## 2017-04-06 NOTE — Telephone Encounter (Signed)
Call from pt - has an appt on 1/22 to see you; wants to know if she needs to come in before this appt for labs? Last labs were 11/27. Thanks

## 2017-04-06 NOTE — Telephone Encounter (Signed)
She should have standing orders for CBCs. If none current let me know - I would like lab before visit. drG

## 2017-04-07 NOTE — Telephone Encounter (Signed)
thanks

## 2017-04-07 NOTE — Telephone Encounter (Signed)
Called pt - lab appt scheduled 1/16 @ 1100 AM. She has standing order for CBC.

## 2017-04-13 ENCOUNTER — Telehealth: Payer: Self-pay | Admitting: Genetics

## 2017-04-13 NOTE — Telephone Encounter (Signed)
Called Gwendolyn Williams to let her know her cells are taking longer to grow than usual, but that they are still growing and looking healthier than they did last week which is a hopefull sign.    I will call her when results are available.  It will likely be several more weeks.

## 2017-04-15 ENCOUNTER — Other Ambulatory Visit (INDEPENDENT_AMBULATORY_CARE_PROVIDER_SITE_OTHER): Payer: Medicare Other

## 2017-04-15 DIAGNOSIS — K55069 Acute infarction of intestine, part and extent unspecified: Secondary | ICD-10-CM

## 2017-04-15 DIAGNOSIS — Z853 Personal history of malignant neoplasm of breast: Secondary | ICD-10-CM | POA: Diagnosis not present

## 2017-04-15 DIAGNOSIS — D471 Chronic myeloproliferative disease: Secondary | ICD-10-CM

## 2017-04-16 ENCOUNTER — Telehealth: Payer: Self-pay | Admitting: *Deleted

## 2017-04-16 LAB — CBC WITH DIFFERENTIAL/PLATELET
Basophils Absolute: 0.1 10*3/uL (ref 0.0–0.2)
Basos: 1 %
EOS (ABSOLUTE): 0.1 10*3/uL (ref 0.0–0.4)
Eos: 2 %
Hematocrit: 39.6 % (ref 34.0–46.6)
Hemoglobin: 12.2 g/dL (ref 11.1–15.9)
Immature Grans (Abs): 0 10*3/uL (ref 0.0–0.1)
Immature Granulocytes: 0 %
Lymphocytes Absolute: 1.1 10*3/uL (ref 0.7–3.1)
Lymphs: 18 %
MCH: 26.4 pg — ABNORMAL LOW (ref 26.6–33.0)
MCHC: 30.8 g/dL — ABNORMAL LOW (ref 31.5–35.7)
MCV: 86 fL (ref 79–97)
Monocytes Absolute: 0.3 10*3/uL (ref 0.1–0.9)
Monocytes: 5 %
Neutrophils Absolute: 4.2 10*3/uL (ref 1.4–7.0)
Neutrophils: 74 %
Platelets: 344 10*3/uL (ref 150–379)
RBC: 4.62 x10E6/uL (ref 3.77–5.28)
RDW: 28.6 % — ABNORMAL HIGH (ref 12.3–15.4)
WBC: 5.7 10*3/uL (ref 3.4–10.8)

## 2017-04-16 NOTE — Telephone Encounter (Signed)
-----   Message from Annia Belt, MD sent at 04/16/2017  8:18 AM EST ----- Call pt: counts good: platlets [phone] - excellent. Stay on current dose of Hydrea

## 2017-04-16 NOTE — Telephone Encounter (Signed)
Called pt - not at home; talked to her husband. Stated they have seen results via My Chart. Informed "counts good: platlets [phone] - excellent. Stay on current dose of Hydrea" per Dr Beryle Beams. Concerned about "RBC's - few teardrops; elliptocytes" - would like to discuss at next visit on Tuesday 1/22.

## 2017-04-21 ENCOUNTER — Ambulatory Visit (INDEPENDENT_AMBULATORY_CARE_PROVIDER_SITE_OTHER): Payer: Medicare Other | Admitting: Oncology

## 2017-04-21 ENCOUNTER — Other Ambulatory Visit: Payer: Self-pay

## 2017-04-21 ENCOUNTER — Encounter: Payer: Self-pay | Admitting: Oncology

## 2017-04-21 VITALS — BP 103/65 | HR 70 | Temp 97.5°F | Ht 61.0 in | Wt 106.4 lb

## 2017-04-21 DIAGNOSIS — Z86718 Personal history of other venous thrombosis and embolism: Secondary | ICD-10-CM | POA: Diagnosis not present

## 2017-04-21 DIAGNOSIS — Z882 Allergy status to sulfonamides status: Secondary | ICD-10-CM

## 2017-04-21 DIAGNOSIS — Z880 Allergy status to penicillin: Secondary | ICD-10-CM | POA: Diagnosis not present

## 2017-04-21 DIAGNOSIS — C946 Myelodysplastic disease, not classified: Secondary | ICD-10-CM | POA: Diagnosis not present

## 2017-04-21 DIAGNOSIS — I10 Essential (primary) hypertension: Secondary | ICD-10-CM

## 2017-04-21 DIAGNOSIS — Z87891 Personal history of nicotine dependence: Secondary | ICD-10-CM

## 2017-04-21 DIAGNOSIS — K909 Intestinal malabsorption, unspecified: Secondary | ICD-10-CM | POA: Diagnosis not present

## 2017-04-21 DIAGNOSIS — D509 Iron deficiency anemia, unspecified: Secondary | ICD-10-CM

## 2017-04-21 DIAGNOSIS — Z7982 Long term (current) use of aspirin: Secondary | ICD-10-CM

## 2017-04-21 DIAGNOSIS — Z853 Personal history of malignant neoplasm of breast: Secondary | ICD-10-CM

## 2017-04-21 DIAGNOSIS — Z7901 Long term (current) use of anticoagulants: Secondary | ICD-10-CM | POA: Insufficient documentation

## 2017-04-21 DIAGNOSIS — Z96642 Presence of left artificial hip joint: Secondary | ICD-10-CM

## 2017-04-21 DIAGNOSIS — D471 Chronic myeloproliferative disease: Secondary | ICD-10-CM

## 2017-04-21 DIAGNOSIS — D693 Immune thrombocytopenic purpura: Secondary | ICD-10-CM | POA: Diagnosis not present

## 2017-04-21 HISTORY — DX: Long term (current) use of anticoagulants: Z79.01

## 2017-04-21 MED ORDER — HYDROXYUREA 500 MG PO CAPS: 500.0000 mg | ORAL_CAPSULE | Freq: Every day | ORAL | 3 refills | Status: DC

## 2017-04-21 MED ORDER — RIVAROXABAN 20 MG PO TABS: 20.0000 mg | ORAL_TABLET | Freq: Every day | ORAL | 3 refills | Status: DC

## 2017-04-21 NOTE — Patient Instructions (Signed)
Continue CBC every month: standing order on file Chem profile on 08/10/17 MD visit 1 week after lab in May

## 2017-04-22 ENCOUNTER — Ambulatory Visit (INDEPENDENT_AMBULATORY_CARE_PROVIDER_SITE_OTHER): Payer: Medicare Other | Admitting: Gynecology

## 2017-04-22 ENCOUNTER — Encounter: Payer: Self-pay | Admitting: Gynecology

## 2017-04-22 VITALS — BP 106/64 | Ht 60.0 in | Wt 106.0 lb

## 2017-04-22 DIAGNOSIS — Z9289 Personal history of other medical treatment: Secondary | ICD-10-CM | POA: Diagnosis not present

## 2017-04-22 DIAGNOSIS — Z01411 Encounter for gynecological examination (general) (routine) with abnormal findings: Secondary | ICD-10-CM

## 2017-04-22 DIAGNOSIS — N952 Postmenopausal atrophic vaginitis: Secondary | ICD-10-CM

## 2017-04-22 DIAGNOSIS — M810 Age-related osteoporosis without current pathological fracture: Secondary | ICD-10-CM

## 2017-04-22 DIAGNOSIS — Z853 Personal history of malignant neoplasm of breast: Secondary | ICD-10-CM

## 2017-04-22 NOTE — Progress Notes (Signed)
Hematology and Oncology Follow Up Visit  TATIANNA IBBOTSON [id number] 10/08/1940 77 y.o. 04/22/2017 11:38 AM   Principle Diagnosis: Encounter Diagnoses  Name Primary?  . Myeloproliferative disorder ([place]) Yes  . 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 She has been on full dose Coumadin anticoagulation and low-dose aspirin. She was recently changed to [place] in July 2017. She made last-minute plans to travel.She was not going to be able to get Coumadin levels checked. I decided to transition her to the Xarelto based on evolving experience with the new oral anticoagulants with respect to decreased bleeding in patients on long-term anticoagulation compared with Coumadin and increasing experience with unusual site thrombotic events.   She was diagnosed with a stage I, ER/PR positive, HER-2-negative, cancer of the left breast in August 2010. Oncotype DX score high risk. She underwent left lumpectomy on 12/26/2008 followed by adjuvant chemotherapy with 4 cycles of Taxotere plus Cytoxan between 02/16/2009 and 04/20/2009. She then had a course of radiation between February 14 and 07/02/2009. She was started on hormonal therapy with Arimidex which she continues at this time. BRCA1 and BRCA2 gene testing was negative.  She developed a large chest wall hematoma at time of the surgery related to tight anticoagulation which has long since resolved. She has one son. One daughter age 9(2016).   Interim History: Overall doing well.  Recently started on hydroxyurea 500 mg daily and she has had a rapid and excellent response with normalization of her platelet count but not at the expense of her white count or hemoglobin.   She has noted some mild dyspnea not exertion related since she has been on the drug but it is not interfering with her activities of daily living. She was concerned with some minor changes reported on the peripheral blood film by the lab techs with occasional teardrop red cells and elliptocytes which I reassured her and her husband are totally benign findings associated with the myeloproliferative disorder. She denies any neurologic symptoms including headache, change in vision, slurred speech, focal weakness, or paresthesias. No abdominal pain or change in bowel habit. She continues on chronic Xarelto anticoagulation. She continues on Arimidex to prevent breast cancer recurrence.  Her daughter now age 9 is concerned about her breast cancer risk.  Mrs. Whetstone has agreed to undergo additional genetic testing since a number of new genes have been described other than the BRCA1 and BRCA2 genes that were tested in her 10 years ago at time of her breast cancer diagnosis. She is accompanied by her husband, a retired Software engineer.  Medications: reviewed  Allergies:  Allergies  Allergen Reactions  . Penicillins Hives    Has patient had a PCN reaction causing immediate rash, facial/tongue/throat swelling, SOB or lightheadedness with hypotension: Unknown Has patient had a PCN reaction causing severe rash involving mucus membranes or skin necrosis: Unknown Has patient had a PCN reaction that required hospitalization: No Has patient had a PCN reaction occurring within the last 10 years: No If all of the above answers are "NO", then may proceed with Cephalosporin use.   . Sulfa Antibiotics Other (See Comments)    unknown    Review of Systems: See interim history Remaining ROS negative:   Physical Exam: Blood pressure 103/65, pulse 70, temperature (!) 97.5 F (36.4 C), temperature source Oral, height  5' 1" (1.549 m), weight 106 lb 6.4 oz (48.3 kg), SpO2 100 %. Wt Readings from Last 3 Encounters:   04/21/17 106 lb 6.4 oz (48.3 kg)  02/10/17 105 lb 4.8 oz (47.8 kg)  10/07/16 101 lb (45.8 kg)     General appearance: Well-nourished Caucasian woman [name]: Pharynx no erythema, exudate, mass, or ulcer. No thyromegaly or thyroid nodules Lymph nodes: No cervical, supraclavicular, or axillary lymphadenopathy Breasts: Extensive deformity left breast from prior cancer surgery, no dominant mass in either breast Lungs: Clear to auscultation, resonant to percussion throughout Heart: Regular rhythm, no murmur, no gallop, no rub, no click, no edema Abdomen: Soft, nontender, normal bowel sounds, no mass, no organomegaly Extremities: No edema, no calf tenderness Musculoskeletal: no joint deformities GU:  Vascular: Carotid pulses 2+, no bruits Neurologic: Alert, oriented, PERRLA, optic discs sharp and vessels normal, no hemorrhage or exudate, cranial nerves grossly normal, motor strength 5 over 5, reflexes 1+ symmetric, upper body coordination normal, gait normal, Skin: No rash or ecchymosis  Lab Results: CBC W/Diff    Component Value Date/Time   WBC 5.7 04/15/2017 1115   WBC 7.8 02/24/2017 1007   RBC 4.62 04/15/2017 1115   RBC 5.38 (H) 02/24/2017 1007   HGB 12.2 04/15/2017 1115   HGB 11.8 12/08/2014 1540   HCT 39.6 04/15/2017 1115   HCT 36.8 12/08/2014 1540   PLT 344 04/15/2017 1115   MCV 86 04/15/2017 1115   MCV 72.0 (L) 12/08/2014 1540   MCH 26.4 (L) 04/15/2017 1115   MCH 24.2 (L) 02/24/2017 1007   MCHC 30.8 (L) 04/15/2017 1115   MCHC 30.7 02/24/2017 1007   RDW 28.6 (H) 04/15/2017 1115   RDW 17.1 (H) 12/08/2014 1540   LYMPHSABS 1.1 04/15/2017 1115   LYMPHSABS 1.4 12/08/2014 1540   MONOABS 0.2 02/24/2017 1007   MONOABS 0.6 12/08/2014 1540   EOSABS 0.1 04/15/2017 1115   BASOSABS 0.1 04/15/2017 1115   BASOSABS 0.1 12/08/2014 1540     Chemistry      Component Value Date/Time   NA 143 02/10/2017 1028   NA 137 12/08/2014 1540   K 4.3 02/10/2017 1028   K 4.2 12/08/2014 1540    CL 102 02/10/2017 1028   CL 105 08/03/2012 1529   CO2 24 02/10/2017 1028   CO2 25 12/08/2014 1540   BUN 25 02/10/2017 1028   BUN 27.4 (H) 12/08/2014 1540   CREATININE 1.03 (H) 02/10/2017 1028   CREATININE 1.1 12/08/2014 1540      Component Value Date/Time   CALCIUM 9.5 02/10/2017 1028   CALCIUM 9.9 12/08/2014 1540   ALKPHOS 63 02/10/2017 1028   ALKPHOS 42 12/08/2014 1540   AST 16 02/10/2017 1028   AST 23 12/08/2014 1540   ALT 14 02/10/2017 1028   ALT 21 12/08/2014 1540   BILITOT 0.4 02/10/2017 1028   BILITOT 0.65 12/08/2014 1540       Radiological Studies: No results found.  Impression:  #1.  Myeloproliferative disorder: Essential thrombocythemia Initially presenting with mesenteric vascular thrombosis 9 years ago. She continues on chronic anticoagulation with low-dose aspirin plus Xarelto.  We were able to observe her platelet count over time and only recently began low-dose hydroxyurea in November 2018 for persistent and progressive thrombocytosis. Excellent response.  I will continue the current dose.  2.  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  See #1 above.  No new thrombotic events.  3.  Stage I ER positive, HER-2 negative, Oncotype DX high risk cancer of the left  breast diagnosed August 2010 treated with lumpectomy, chemotherapy, radiation, and subsequent continuous aromatase inhibitor hormonal therapy with plan to complete 10 years in April 2021.  No evidence for recurrent disease at this time.  4.  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  Essential hypertension  6.  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  We discussed it will now be difficult to follow MCV as an indicator of iron deficiency since Hydrea will interfere with DNA synthesis and result in macrocytosis.  CC: Patient Care Team: Gaynelle Arabian, MD as PCP - General (Family Medicine) Annia Belt,  MD as Consulting Physician (Oncology) Rolm Bookbinder, MD as Consulting Physician (General Surgery)   Murriel Hopper, MD, [place]  Hematology-Oncology/Internal Medicine     1/23/201911:38 AM

## 2017-04-22 NOTE — Patient Instructions (Signed)
Follow-up as needed for any GYN issues.

## 2017-04-22 NOTE — Progress Notes (Signed)
    Gwendolyn Williams [birth year] [id number]        76 y.o.  G2P0002 for breast and pelvic exam.  No gynecologic complaints.  Past medical history,surgical history, problem list, medications, allergies, family history and social history were all reviewed and documented as reviewed in the EPIC chart.  ROS:  Performed with pertinent positives and negatives included in the history, assessment and plan.   Additional significant findings : None   Exam: [name] assistant Vitals:   04/22/17 1422  BP: 106/64  Weight: 106 lb (48.1 kg)  Height: 5' (1.524 m)   Body mass index is 20.7 kg/m.  General appearance:  Normal affect, orientation and appearance. Skin: Grossly normal HEENT: Without gross lesions.  No cervical or supraclavicular adenopathy. Thyroid normal.  Lungs:  Clear without wheezing, rales or rhonchi Cardiac: RR, without RMG Abdominal:  Soft, nontender, without masses, guarding, rebound, organomegaly or hernia Breasts:  Examined lying and sitting.  Right without masses, retractions, discharge or axillary adenopathy.   Left with old lumpectomy scar retraction.  No masses, discharge or adenopathy. Pelvic:  Ext, BUS, Vagina: With atrophic changes  Cervix: With atrophic changes  Uterus: Anteverted, normal size, shape and contour, midline and mobile nontender   Adnexa: Without masses or tenderness    Anus and perineum: Normal   Rectovaginal: Normal sphincter tone without palpated masses or tenderness.    Assessment/Plan:  77 y.o. G24P0002 female for rest and pelvic exam.   1. Postmenopausal/atrophic changes.  No significant hot flushes, night sweats, vaginal dryness or bleeding.  Continue to monitor and report any issues or bleeding. 2. History of left-sided breast cancer.  Exam NED.  Mammography 12/2016. 3. Osteoporosis.  DEXA 2017 T score -3.6 distal radius.  Being managed by her primary physician. 4. Pap smear 2014.  No Pap smear done today.  No history of significant abnormal  Pap smears.  We both agree to stop screening per current screening guidelines based on age. 5. Colonoscopy 2016.  Repeat at their recommended interval. 6. Health maintenance.  No routine lab work done as patient does this elsewhere.  Follow-up 1 year, sooner as needed.   Anastasio Auerbach MD, 2:51 PM 04/22/2017

## 2017-04-23 DIAGNOSIS — S0501XA Injury of conjunctiva and corneal abrasion without foreign body, right eye, initial encounter: Secondary | ICD-10-CM | POA: Diagnosis not present

## 2017-05-01 DIAGNOSIS — Z96642 Presence of left artificial hip joint: Secondary | ICD-10-CM | POA: Diagnosis not present

## 2017-05-14 DIAGNOSIS — M25552 Pain in left hip: Secondary | ICD-10-CM | POA: Diagnosis not present

## 2017-05-15 ENCOUNTER — Other Ambulatory Visit (INDEPENDENT_AMBULATORY_CARE_PROVIDER_SITE_OTHER): Payer: Medicare Other

## 2017-05-15 DIAGNOSIS — D471 Chronic myeloproliferative disease: Secondary | ICD-10-CM | POA: Diagnosis not present

## 2017-05-15 DIAGNOSIS — Z853 Personal history of malignant neoplasm of breast: Secondary | ICD-10-CM | POA: Diagnosis not present

## 2017-05-15 DIAGNOSIS — K55069 Acute infarction of intestine, part and extent unspecified: Secondary | ICD-10-CM | POA: Diagnosis not present

## 2017-05-16 LAB — CBC WITH DIFFERENTIAL/PLATELET
Basophils Absolute: 0.1 10*3/uL (ref 0.0–0.2)
Basos: 1 %
EOS (ABSOLUTE): 0.1 10*3/uL (ref 0.0–0.4)
Eos: 1 %
Hematocrit: 39.8 % (ref 34.0–46.6)
Hemoglobin: 13.1 g/dL (ref 11.1–15.9)
Immature Grans (Abs): 0 10*3/uL (ref 0.0–0.1)
Immature Granulocytes: 0 %
Lymphocytes Absolute: 1.1 10*3/uL (ref 0.7–3.1)
Lymphs: 19 %
MCH: 29.6 pg (ref 26.6–33.0)
MCHC: 32.9 g/dL (ref 31.5–35.7)
MCV: 90 fL (ref 79–97)
Monocytes Absolute: 0.3 10*3/uL (ref 0.1–0.9)
Monocytes: 6 %
Neutrophils Absolute: 4.4 10*3/uL (ref 1.4–7.0)
Neutrophils: 73 %
Platelets: 295 10*3/uL (ref 150–379)
RBC: 4.43 x10E6/uL (ref 3.77–5.28)
WBC: 6 10*3/uL (ref 3.4–10.8)

## 2017-05-18 ENCOUNTER — Telehealth: Payer: Self-pay | Admitting: *Deleted

## 2017-05-18 DIAGNOSIS — M25552 Pain in left hip: Secondary | ICD-10-CM | POA: Diagnosis not present

## 2017-05-18 NOTE — Telephone Encounter (Signed)
-----   Message from Annia Belt, MD sent at 05/18/2017  7:58 AM EST ----- Call pt: counts perfect platelets 295,000. Continue current dose of Hydrea

## 2017-05-18 NOTE — Telephone Encounter (Signed)
Called pt - not at home; talked to her husband "counts perfect platelets 295,000. Continue current dose of Hydrea" per Dr Beryle Beams. SDated they saw results on My Chart this morning. And let Mrs Gwendolyn Williams know to continue current dose of her medication.

## 2017-05-20 DIAGNOSIS — M25552 Pain in left hip: Secondary | ICD-10-CM | POA: Diagnosis not present

## 2017-05-21 ENCOUNTER — Telehealth: Payer: Self-pay | Admitting: Genetics

## 2017-05-21 NOTE — Telephone Encounter (Signed)
Revealed negative genetic testing.  This normal result is reassuring and indicates that we did not identify a genetic cause for Gwendolyn Williams cancer. It is unlikely that there is an increased risk of another cancer due to a mutation in one of these genes.  However, genetic testing is not perfect, and cannot definitively rule out a hereditary cause.  It will be important for her to keep in contact with genetics to learn if any additional testing may be needed in the future.

## 2017-05-25 DIAGNOSIS — C50919 Malignant neoplasm of unspecified site of unspecified female breast: Secondary | ICD-10-CM | POA: Diagnosis not present

## 2017-05-25 DIAGNOSIS — N183 Chronic kidney disease, stage 3 (moderate): Secondary | ICD-10-CM | POA: Diagnosis not present

## 2017-05-25 DIAGNOSIS — C946 Myelodysplastic disease, not classified: Secondary | ICD-10-CM | POA: Diagnosis not present

## 2017-05-25 DIAGNOSIS — M810 Age-related osteoporosis without current pathological fracture: Secondary | ICD-10-CM | POA: Diagnosis not present

## 2017-05-25 DIAGNOSIS — E7800 Pure hypercholesterolemia, unspecified: Secondary | ICD-10-CM | POA: Diagnosis not present

## 2017-05-25 DIAGNOSIS — I129 Hypertensive chronic kidney disease with stage 1 through stage 4 chronic kidney disease, or unspecified chronic kidney disease: Secondary | ICD-10-CM | POA: Diagnosis not present

## 2017-05-25 DIAGNOSIS — E559 Vitamin D deficiency, unspecified: Secondary | ICD-10-CM | POA: Diagnosis not present

## 2017-05-25 DIAGNOSIS — D692 Other nonthrombocytopenic purpura: Secondary | ICD-10-CM | POA: Diagnosis not present

## 2017-05-25 DIAGNOSIS — K219 Gastro-esophageal reflux disease without esophagitis: Secondary | ICD-10-CM | POA: Diagnosis not present

## 2017-05-27 DIAGNOSIS — M25552 Pain in left hip: Secondary | ICD-10-CM | POA: Diagnosis not present

## 2017-05-29 DIAGNOSIS — M25552 Pain in left hip: Secondary | ICD-10-CM | POA: Diagnosis not present

## 2017-06-01 ENCOUNTER — Encounter: Payer: Self-pay | Admitting: Genetics

## 2017-06-01 ENCOUNTER — Ambulatory Visit: Payer: Self-pay | Admitting: Genetics

## 2017-06-01 DIAGNOSIS — M25552 Pain in left hip: Secondary | ICD-10-CM | POA: Diagnosis not present

## 2017-06-01 DIAGNOSIS — Z1379 Encounter for other screening for genetic and chromosomal anomalies: Secondary | ICD-10-CM | POA: Insufficient documentation

## 2017-06-01 DIAGNOSIS — Z803 Family history of malignant neoplasm of breast: Secondary | ICD-10-CM

## 2017-06-01 DIAGNOSIS — Z853 Personal history of malignant neoplasm of breast: Secondary | ICD-10-CM

## 2017-06-01 NOTE — Progress Notes (Signed)
HPI: Gwendolyn Williams was previously seen in the [place] clinic on 03/02/2018 due to a personal and family history of breast cancer and concerns regarding a hereditary predisposition to cancer. Please refer to our prior cancer genetics clinic note for more information regarding Gwendolyn Williams medical, social and family histories, and our assessment and recommendations, at the time. Gwendolyn Williams recent genetic test results were disclosed to her, as well as recommendations warranted by these results. These results and recommendations are discussed in more detail below.  Genetic Testing History: In 2010 Gwendolyn Williams had Multi-site (3 site BRCA1/2) genetic testing.  The results from this test were reportedly negative- the report is currently unavailable for review.     FAMILY HISTORY:  We obtained a detailed, 4-generation family history.  Significant diagnoses are listed below: Family History  Problem Relation Age of Onset  . Breast cancer Mother 23  . Lung cancer Mother   . Heart disease Father   . Basal cell carcinoma Father        x2  . Ovarian cysts Sister   . Breast cancer Maternal Aunt 79  . Breast cancer Paternal Aunt 86  . Ovarian cysts Maternal Grandmother        possibly had ovarian cancer as well  . Breast cancer Maternal Aunt 28  . Cancer Other        either pancreatic or colon  . Other Daughter 72       breast lumpectomy- complex sclerosing lesion  . Polycystic ovary syndrome Daughter   . Bladder Cancer Cousin     Gwendolyn Williams has a 65 year-old daughter who recently had a lumpectomy for a complex sclerosing lesion.  This daughter has a 69 year-old daughter.   Gwendolyn Williams and her daughter came together for genetic counseling.  Gwendolyn Williams also has a 16 year-old son with no history of cancer.  Gwendolyn Williams has a sister who has no history of cancer.    Gwendolyn Williams father died at 14 and had a history of 2 basal cell carcinomas. Gwendolyn Williams has several uncles with no history of  cancer and a paternal aunt who had a history of breast cancer in her 71's (died at 58). Gwendolyn Williams paternal grandparents have no known history of cancer and were cousins (she is unsure if 1st or 2nd cousins).  Gwendolyn Williams paternal grandfather's mother had either colon or pancreatic cancer.    Gwendolyn Williams mother was diagnosed with breast cancer in her early 41's and lung cancer in her 73's.  Gwendolyn Williams has 3 maternal aunts, 2 of which developed breast caner (about age 80?).  Gwendolyn Williams also has 2 maternal uncles with no history of cancer.  Gwendolyn Williams has several cousins with no history of cancer, but one maternal cousin died of bladder cancer.  Gwendolyn Williams maternal grandparents died at 'young ages'.  She reports that her maternal grandmother had ovarian cysts, and possibly had ovarian cancer.    Gwendolyn Williams is unaware of previous family history of genetic testing for hereditary cancer risks. Patient's maternal ancestors are of Ashkenazi Hewish descent, and paternal ancestors are of Ashkenazi Jewish descent. There is no reported Ashkenazi Jewish ancestry. There consanguinity of her paternal grandparents (unsure if they were cousins or 2nd cousins).  GENETIC TEST RESULTS: A skin biopsy sample was obtained and sent to [facility] for cell culture and DNA isolation.  Genetic testing was performed through [facility]'s Common Hereditary Cancer Panel reported out on 05/19/2017 showed no  pathogenic mutations. The Common Hereditary Cancer Panel offered by [facility] includes sequencing and/or deletion duplication testing of the following 47 genes: APC, ATM, AXIN2, BARD1, BMPR1A, BRCA1, BRCA2, BRIP1, CDH1, CDKN2A (p14ARF), CDKN2A (p16INK4a), CKD4, CHEK2, CTNNA1, DICER1, EPCAM (Deletion/duplication testing only), GREM1 (promoter region deletion/duplication testing only), KIT, MEN1, MLH1, MSH2, MSH3, MSH6, MUTYH, NBN, NF1, NHTL1, PALB2, PDGFRA, PMS2, POLD1, POLE, PTEN, RAD50, RAD51C, RAD51D, SDHB, SDHC, SDHD, SMAD4, SMARCA4.  STK11, TP53, TSC1, TSC2, and VHL.  The following genes were evaluated for sequence changes only: SDHA and HOXB13 c.251G>A variant only..  The test report will be scanned into EPIC and will be located under the Molecular Pathology section of the Results Review tab.A portion of the result report is included below for reference.     We discussed with Ms. Resler that because current genetic testing is not perfect, it is possible there may be a gene mutation in one of these genes that current testing cannot detect, but that chance is small. We also discussed, that there could be another gene that has not yet been discovered, or that we have not yet tested, that is responsible for the cancer diagnoses in the family. It is also possible there is a hereditary cause for the cancer in the family that Ms. Sample did not inherit and therefore was not identified in her testing.  Therefore, it is important to remain in touch with cancer genetics in the future so that we can continue to offer Ms. Lavalle the most up to date genetic testing.   ADDITIONAL GENETIC TESTING: We discussed with Ms. Rostad that there are other genes that are associated with increased cancer risk that can be analyzed. The laboratories that offer this testing look at these additional genes via a hereditary cancer gene panel. Should Ms. Silberman wish to pursue additional genetic testing, we are happy to discuss and coordinate this testing, at any time.    CANCER SCREENING RECOMMENDATIONS:  Ms. Haws test result is considered negative (normal).  This means that we have not identified a hereditary cause for her personal and family history of cancer at this time.   This does not definitively rule out a hereditary basis for her cancer. It is still possible that there could be genetic mutations that are undetectable by current technology, or genetic mutations in genes that have not been tested or identified to increase cancer risk.  It is also  possible there is a hereditary cause for the cancer in her family that Ms. Roemer did not inherit and therefore was not identified in her genetic testing.   It is recommended she continue to follow the cancer management and screening guidelines provided by her oncology and primary healthcare provider. Other factors such as her personal and family history may still affect her cancer risk.   RECOMMENDATIONS FOR FAMILY MEMBERS: Women in this family might be at some increased risk of developing cancer, over the general population risk, simply due to the family history of cancer. We recommended women in this family have a yearly mammogram beginning at age 51, or 69 years younger than the earliest onset of cancer, an annual clinical breast exam, and perform monthly breast self-exams. Women in this family should also have a gynecological exam as recommended by their primary provider. All family members should have a colonoscopy by age 11.  All family members should inform their physicians about the family history of cancer so their doctors can make the most appropriate screening recommendations for them.   Based on  Ms. Flegal family history, we recommended her sister and all maternal realtives, have genetic counseling and testing. Ms. Roulhac will let us know if we can be of any assistance in coordinating genetic counseling and/or testing for these family members.   FOLLOW-UP: Lastly, we discussed with Ms. Delorey that cancer genetics is a rapidly advancing field and it is possible that new genetic tests will be appropriate for her and/or her family members in the future. We encouraged her to remain in contact with cancer genetics on an annual basis so we can update her personal and family histories and let her know of advances in cancer genetics that may benefit this family.   Our contact number was provided. Ms. Popoca questions were answered to her satisfaction, and she knows she is welcome to call us at  anytime with additional questions or concerns.   Ferol Luz, MS, [facility] Certified Genetic Counselor [email or web address]

## 2017-06-05 DIAGNOSIS — M25552 Pain in left hip: Secondary | ICD-10-CM | POA: Diagnosis not present

## 2017-06-12 ENCOUNTER — Other Ambulatory Visit (INDEPENDENT_AMBULATORY_CARE_PROVIDER_SITE_OTHER): Payer: Medicare Other

## 2017-06-12 DIAGNOSIS — K55069 Acute infarction of intestine, part and extent unspecified: Secondary | ICD-10-CM

## 2017-06-12 DIAGNOSIS — D471 Chronic myeloproliferative disease: Secondary | ICD-10-CM | POA: Diagnosis not present

## 2017-06-12 DIAGNOSIS — Z853 Personal history of malignant neoplasm of breast: Secondary | ICD-10-CM

## 2017-06-12 DIAGNOSIS — M25552 Pain in left hip: Secondary | ICD-10-CM | POA: Diagnosis not present

## 2017-06-13 LAB — CBC WITH DIFFERENTIAL/PLATELET
Basophils Absolute: 0 10*3/uL (ref 0.0–0.2)
Basos: 1 %
EOS (ABSOLUTE): 0.1 10*3/uL (ref 0.0–0.4)
Eos: 2 %
Hematocrit: 39.8 % (ref 34.0–46.6)
Hemoglobin: 13.1 g/dL (ref 11.1–15.9)
Immature Grans (Abs): 0 10*3/uL (ref 0.0–0.1)
Immature Granulocytes: 0 %
Lymphocytes Absolute: 1 10*3/uL (ref 0.7–3.1)
Lymphs: 18 %
MCH: 31.5 pg (ref 26.6–33.0)
MCHC: 32.9 g/dL (ref 31.5–35.7)
MCV: 96 fL (ref 79–97)
Monocytes Absolute: 0.4 10*3/uL (ref 0.1–0.9)
Monocytes: 8 %
Neutrophils Absolute: 3.9 10*3/uL (ref 1.4–7.0)
Neutrophils: 71 %
Platelets: 357 10*3/uL (ref 150–379)
RBC: 4.16 x10E6/uL (ref 3.77–5.28)
RDW: 21.1 % — ABNORMAL HIGH (ref 12.3–15.4)
WBC: 5.5 10*3/uL (ref 3.4–10.8)

## 2017-06-15 ENCOUNTER — Telehealth: Payer: Self-pay | Admitting: *Deleted

## 2017-06-15 DIAGNOSIS — M25552 Pain in left hip: Secondary | ICD-10-CM | POA: Diagnosis not present

## 2017-06-15 NOTE — Telephone Encounter (Signed)
-----   Message from Annia Belt, MD sent at 06/15/2017  7:58 AM EDT ----- Call pt: counts good; stay on same dose of Hydrea

## 2017-06-15 NOTE — Telephone Encounter (Signed)
Called pt - talked to Gwendolyn Williams; stated they saw results on My Chart. Informed "counts good; stay on same dose of Hydrea" per Dr Beryle Beams - stated he will tell [name] and thanks for calling.

## 2017-07-01 DIAGNOSIS — M1612 Unilateral primary osteoarthritis, left hip: Secondary | ICD-10-CM | POA: Diagnosis not present

## 2017-07-14 DIAGNOSIS — Z86018 Personal history of other benign neoplasm: Secondary | ICD-10-CM | POA: Diagnosis not present

## 2017-07-14 DIAGNOSIS — L570 Actinic keratosis: Secondary | ICD-10-CM | POA: Diagnosis not present

## 2017-07-14 DIAGNOSIS — D225 Melanocytic nevi of trunk: Secondary | ICD-10-CM | POA: Diagnosis not present

## 2017-07-14 DIAGNOSIS — Z85828 Personal history of other malignant neoplasm of skin: Secondary | ICD-10-CM | POA: Diagnosis not present

## 2017-07-14 DIAGNOSIS — K121 Other forms of stomatitis: Secondary | ICD-10-CM | POA: Diagnosis not present

## 2017-07-14 DIAGNOSIS — L821 Other seborrheic keratosis: Secondary | ICD-10-CM | POA: Diagnosis not present

## 2017-07-16 ENCOUNTER — Other Ambulatory Visit: Payer: Medicare Other

## 2017-07-17 ENCOUNTER — Other Ambulatory Visit: Payer: Medicare Other

## 2017-07-20 ENCOUNTER — Other Ambulatory Visit (INDEPENDENT_AMBULATORY_CARE_PROVIDER_SITE_OTHER): Payer: Medicare Other

## 2017-07-20 DIAGNOSIS — D471 Chronic myeloproliferative disease: Secondary | ICD-10-CM | POA: Diagnosis not present

## 2017-07-20 DIAGNOSIS — Z853 Personal history of malignant neoplasm of breast: Secondary | ICD-10-CM | POA: Diagnosis not present

## 2017-07-20 DIAGNOSIS — K55069 Acute infarction of intestine, part and extent unspecified: Secondary | ICD-10-CM

## 2017-07-21 ENCOUNTER — Telehealth: Payer: Self-pay | Admitting: *Deleted

## 2017-07-21 LAB — CBC WITH DIFFERENTIAL/PLATELET
Basophils Absolute: 0.1 10*3/uL (ref 0.0–0.2)
Basos: 1 %
EOS (ABSOLUTE): 0.1 10*3/uL (ref 0.0–0.4)
Eos: 2 %
Hematocrit: 36.2 % (ref 34.0–46.6)
Hemoglobin: 12 g/dL (ref 11.1–15.9)
Immature Grans (Abs): 0 10*3/uL (ref 0.0–0.1)
Immature Granulocytes: 1 %
Lymphocytes Absolute: 1.4 10*3/uL (ref 0.7–3.1)
Lymphs: 20 %
MCH: 34.8 pg — ABNORMAL HIGH (ref 26.6–33.0)
MCHC: 33.1 g/dL (ref 31.5–35.7)
MCV: 105 fL — ABNORMAL HIGH (ref 79–97)
Monocytes Absolute: 0.4 10*3/uL (ref 0.1–0.9)
Monocytes: 6 %
Neutrophils Absolute: 5.1 10*3/uL (ref 1.4–7.0)
Neutrophils: 70 %
Platelets: 476 10*3/uL — ABNORMAL HIGH (ref 150–379)
RBC: 3.45 x10E6/uL — ABNORMAL LOW (ref 3.77–5.28)
RDW: 17.2 % — ABNORMAL HIGH (ref 12.3–15.4)
WBC: 7.1 10*3/uL (ref 3.4–10.8)

## 2017-07-21 NOTE — Telephone Encounter (Signed)
Pt called / informed "platelet count up from 357,000 to 476,000. Any current or recent infection or surgery?" per Dr Beryle Beams. Stated she saw results on My Chart. Stated she had 2 dental implants x 3 weeks ago and tooth extraction prior - took Clindamycin. And she's taking Hydrea 500 mg daily.

## 2017-07-21 NOTE — Telephone Encounter (Signed)
OK - platelet count probably up non specifically from acute inflammation - let's stay on same dose of Hydrea & repeat count next month as scheduled - please call her

## 2017-07-21 NOTE — Telephone Encounter (Signed)
Pt unavailable - talked to pt's husband; informed "platelet count probably up non specifically from acute inflammation - let's stay on same dose of Hydrea & repeat count next month as scheduled " per Dr Beryle Beams. Stated he will let her know; also reminded next lab appt is 5/17.

## 2017-07-21 NOTE — Telephone Encounter (Signed)
-----   Message from Annia Belt, MD sent at 07/21/2017  8:03 AM EDT ----- Call pt: platelet count up from 357,000 to 476,000. Any current or recent infection or surgery? Confirm she is taking Hydrea 500 mg daily. Let me know the answers then I will advise her.

## 2017-08-14 ENCOUNTER — Other Ambulatory Visit (INDEPENDENT_AMBULATORY_CARE_PROVIDER_SITE_OTHER): Payer: Medicare Other

## 2017-08-14 ENCOUNTER — Other Ambulatory Visit: Payer: Medicare Other

## 2017-08-14 DIAGNOSIS — D471 Chronic myeloproliferative disease: Secondary | ICD-10-CM

## 2017-08-14 DIAGNOSIS — K55069 Acute infarction of intestine, part and extent unspecified: Secondary | ICD-10-CM | POA: Diagnosis not present

## 2017-08-14 DIAGNOSIS — Z853 Personal history of malignant neoplasm of breast: Secondary | ICD-10-CM | POA: Diagnosis not present

## 2017-08-15 LAB — COMPREHENSIVE METABOLIC PANEL
ALT: 13 IU/L (ref 0–32)
AST: 16 IU/L (ref 0–40)
Albumin/Globulin Ratio: 2 (ref 1.2–2.2)
Albumin: 4.3 g/dL (ref 3.5–4.8)
Alkaline Phosphatase: 48 IU/L (ref 39–117)
BUN/Creatinine Ratio: 20 (ref 12–28)
BUN: 25 mg/dL (ref 8–27)
Bilirubin Total: 0.6 mg/dL (ref 0.0–1.2)
CO2: 22 mmol/L (ref 20–29)
Calcium: 10.9 mg/dL — ABNORMAL HIGH (ref 8.7–10.3)
Chloride: 101 mmol/L (ref 96–106)
Creatinine, Ser: 1.22 mg/dL — ABNORMAL HIGH (ref 0.57–1.00)
GFR calc Af Amer: 50 mL/min/{1.73_m2} — ABNORMAL LOW (ref >59)
GFR calc non Af Amer: 44 mL/min/{1.73_m2} — ABNORMAL LOW (ref >59)
Globulin, Total: 2.1 g/dL (ref 1.5–4.5)
Glucose: 101 mg/dL — ABNORMAL HIGH (ref 65–99)
Potassium: 3.9 mmol/L (ref 3.5–5.2)
Sodium: 141 mmol/L (ref 134–144)
Total Protein: 6.4 g/dL (ref 6.0–8.5)

## 2017-08-15 LAB — CBC WITH DIFFERENTIAL/PLATELET
Basophils Absolute: 0 10*3/uL (ref 0.0–0.2)
Basos: 0 %
EOS (ABSOLUTE): 0.1 10*3/uL (ref 0.0–0.4)
Eos: 1 %
Hematocrit: 37.6 % (ref 34.0–46.6)
Hemoglobin: 12.5 g/dL (ref 11.1–15.9)
Immature Grans (Abs): 0 10*3/uL (ref 0.0–0.1)
Immature Granulocytes: 0 %
Lymphocytes Absolute: 1 10*3/uL (ref 0.7–3.1)
Lymphs: 13 %
MCH: 35.4 pg — ABNORMAL HIGH (ref 26.6–33.0)
MCHC: 33.2 g/dL (ref 31.5–35.7)
MCV: 107 fL — ABNORMAL HIGH (ref 79–97)
Monocytes Absolute: 0.4 10*3/uL (ref 0.1–0.9)
Monocytes: 5 %
Neutrophils Absolute: 6 10*3/uL (ref 1.4–7.0)
Neutrophils: 81 %
Platelets: 389 10*3/uL — ABNORMAL HIGH (ref 150–379)
RBC: 3.53 x10E6/uL — ABNORMAL LOW (ref 3.77–5.28)
RDW: 16.3 % — ABNORMAL HIGH (ref 12.3–15.4)
WBC: 7.5 10*3/uL (ref 3.4–10.8)

## 2017-08-15 LAB — URIC ACID: Uric Acid: 5.4 mg/dL (ref 2.5–7.1)

## 2017-08-15 LAB — LACTATE DEHYDROGENASE: LDH: 216 IU/L (ref 119–226)

## 2017-08-17 ENCOUNTER — Telehealth: Payer: Self-pay | Admitting: *Deleted

## 2017-08-17 NOTE — Telephone Encounter (Signed)
-----   Message from Annia Belt, MD sent at 08/17/2017 10:00 AM EDT ----- Call pt: platelets good at 389,000. Stay on current dose of Hydrea. Calcium level & kidney function running a little high. If taking a calcium supplement - I would stop it. We will send copy of lab to Dr Marisue Humble

## 2017-08-17 NOTE — Telephone Encounter (Signed)
Called pt - unavailable; talked to Mr Standlee, stated thye had seen lab results - informed "platelets good at 389,000. Stay on current dose of Hydrea. Calcium level & kidney function running a little high. If taking a calcium supplement - I would stop it. We will send copy of lab to Dr Marisue Humble " per Dr Beryle Beams. Stated she's on a calcium supplement and will tell her to stop.

## 2017-08-25 ENCOUNTER — Ambulatory Visit (INDEPENDENT_AMBULATORY_CARE_PROVIDER_SITE_OTHER): Payer: Medicare Other | Admitting: Oncology

## 2017-08-25 ENCOUNTER — Other Ambulatory Visit: Payer: Self-pay

## 2017-08-25 ENCOUNTER — Encounter: Payer: Self-pay | Admitting: Oncology

## 2017-08-25 VITALS — BP 105/64 | HR 60 | Temp 97.7°F | Ht 61.0 in | Wt 106.8 lb

## 2017-08-25 DIAGNOSIS — Z87891 Personal history of nicotine dependence: Secondary | ICD-10-CM | POA: Diagnosis not present

## 2017-08-25 DIAGNOSIS — K55069 Acute infarction of intestine, part and extent unspecified: Secondary | ICD-10-CM | POA: Diagnosis not present

## 2017-08-25 DIAGNOSIS — Z880 Allergy status to penicillin: Secondary | ICD-10-CM | POA: Diagnosis not present

## 2017-08-25 DIAGNOSIS — D471 Chronic myeloproliferative disease: Secondary | ICD-10-CM

## 2017-08-25 DIAGNOSIS — Z888 Allergy status to other drugs, medicaments and biological substances status: Secondary | ICD-10-CM | POA: Diagnosis not present

## 2017-08-25 DIAGNOSIS — Z79899 Other long term (current) drug therapy: Secondary | ICD-10-CM

## 2017-08-25 DIAGNOSIS — N182 Chronic kidney disease, stage 2 (mild): Secondary | ICD-10-CM

## 2017-08-25 DIAGNOSIS — Z853 Personal history of malignant neoplasm of breast: Secondary | ICD-10-CM | POA: Diagnosis not present

## 2017-08-25 DIAGNOSIS — Z7901 Long term (current) use of anticoagulants: Secondary | ICD-10-CM | POA: Diagnosis not present

## 2017-08-25 DIAGNOSIS — Z7982 Long term (current) use of aspirin: Secondary | ICD-10-CM

## 2017-08-25 NOTE — Progress Notes (Signed)
Hematology and Oncology Follow Up Visit  DACIE MANDEL [id number] 1941-03-24 77 y.o. 08/25/2017 9:21 AM   Principle Diagnosis: Encounter Diagnoses  Name Primary?  . Current use of long term anticoagulation Yes  . Myeloproliferative disorder ([place])   . 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)   . Hypercalcemia   . 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 She has been on full dose Coumadin anticoagulation and low-dose aspirin. She was recently changed to [place] in July 2017. She made last-minute plans to travel.She was not going to be able to get Coumadin levels checked. I decided to transition her to the Xarelto based on evolving experience with the new oral anticoagulants with respect to decreased bleeding in patients on long-term anticoagulation compared with Coumadin and increasing experience with unusual site thrombotic events. Due to progressive thrombocytosis, she was started on low-dose Hydrea 500 mg daily in November 2018.  She had a very nice response with excellent control of her platelet count without fall in her hemoglobin or white count.  She was diagnosed with a stage I, ER/PR positive, HER-2-negative, cancer of the left breast in August 2010. Oncotype DX score high risk. She underwent left lumpectomy on 12/26/2008 followed by adjuvant chemotherapy with 4 cycles of Taxotere plus Cytoxan between 02/16/2009 and 04/20/2009. She then had a course of radiation between February 14 and 07/02/2009. She was started on hormonal therapy with Arimidex which she continues at this time. BRCA1 and BRCA2 gene testing was negative.  She developed a large chest wall hematoma at time of the surgery  related to tight anticoagulation which has long since resolved. She has one son. One daughter age 34(2016).    Interim History: Overall doing well.  No interim medical problems.  She continues to experience intermittent dyspnea when she walks on an incline.  No PND or orthopnea.  No chest pain or palpitations.  No new or progressive symptoms.  She does exercises at the gym without any difficulty.  She has had intermittent dyspnea to this degree which she relates to the anastrozole.  She has now been on the drug for 9 of 10 planned years.  Lab done in anticipation of today's visit showed new borderline hypercalcemia with calcium 10.9 and rising creatinine up to 1.2.  She is on a thiazide containing diuretic.  Blood pressure if anything runs low and she may not need to be on this medicine at all.  She is taking a calcium supplement.  Pending further evaluation we phoned her one we got the lab results and told her to stop the calcium.  She gets an occasional abdominal cramp.  No significant or progressive pain.  No hematochezia or melena.  No signs of any new thrombotic events.  Husband accompanies her today.  She told me that her 33 year old sister living in [place] probably is developing Alzheimer's and is under evaluation for this at this time.  Medications: reviewed  Allergies:  Allergies  Allergen Reactions  . Penicillins Hives    Has patient had a PCN reaction causing immediate rash, facial/tongue/throat swelling, SOB or lightheadedness with hypotension: Unknown Has patient had a PCN reaction causing severe rash involving mucus membranes or skin necrosis: Unknown Has patient had a PCN reaction that required hospitalization: No Has patient had a PCN reaction occurring within  the last 10 years: No If all of the above answers are "NO", then may proceed with Cephalosporin use.   . Sulfa Antibiotics Other (See Comments)    unknown    Review of Systems: See interim history Remaining ROS  negative:   Physical Exam: Blood pressure 105/64, pulse 60, temperature 97.7 F (36.5 C), temperature source Oral, height '5\' 1"'$  (1.549 m), weight 106 lb 12.8 oz (48.4 kg), SpO2 100 %. Wt Readings from Last 3 Encounters:  08/25/17 106 lb 12.8 oz (48.4 kg)  04/22/17 106 lb (48.1 kg)  04/21/17 106 lb 6.4 oz (48.3 kg)     General appearance: petite Caucasian woman [name]: Pharynx no erythema, exudate, mass, or ulcer. No thyromegaly or thyroid nodules Lymph nodes: No cervical, supraclavicular, or axillary lymphadenopathy Breasts:  Lungs: Clear to auscultation, resonant to percussion throughout Heart: Regular rhythm, no murmur, no gallop, no rub, no click, no edema Abdomen: Soft, nontender, normal bowel sounds, no mass, no organomegaly Extremities: No edema, no calf tenderness Musculoskeletal: no joint deformities GU:  Vascular: Carotid pulses 2+, no bruits, distal pulses: Dorsalis pedis 1+ symmetric Neurologic: Alert, oriented, PERRLA, optic discs sharp and vessels normal on the left, no hemorrhage or exudate, fundus not visualized on the right cranial nerves grossly normal, motor strength 5 over 5, reflexes 1+ symmetric, upper body coordination normal, gait normal, Skin: No rash or ecchymosis  Lab Results: CBC W/Diff    Component Value Date/Time   WBC 7.5 08/14/2017 1346   WBC 7.8 02/24/2017 1007   RBC 3.53 (L) 08/14/2017 1346   RBC 5.38 (H) 02/24/2017 1007   HGB 12.5 08/14/2017 1346   HGB 11.8 12/08/2014 1540   HCT 37.6 08/14/2017 1346   HCT 36.8 12/08/2014 1540   PLT 389 (H) 08/14/2017 1346   MCV 107 (H) 08/14/2017 1346   MCV 72.0 (L) 12/08/2014 1540   MCH 35.4 (H) 08/14/2017 1346   MCH 24.2 (L) 02/24/2017 1007   MCHC 33.2 08/14/2017 1346   MCHC 30.7 02/24/2017 1007   RDW 16.3 (H) 08/14/2017 1346   RDW 17.1 (H) 12/08/2014 1540   LYMPHSABS 1.0 08/14/2017 1346   LYMPHSABS 1.4 12/08/2014 1540   MONOABS 0.2 02/24/2017 1007   MONOABS 0.6 12/08/2014 1540   EOSABS 0.1  08/14/2017 1346   BASOSABS 0.0 08/14/2017 1346   BASOSABS 0.1 12/08/2014 1540     Chemistry      Component Value Date/Time   NA 141 08/14/2017 1346   NA 137 12/08/2014 1540   K 3.9 08/14/2017 1346   K 4.2 12/08/2014 1540   CL 101 08/14/2017 1346   CL 105 08/03/2012 1529   CO2 22 08/14/2017 1346   CO2 25 12/08/2014 1540   BUN 25 08/14/2017 1346   BUN 27.4 (H) 12/08/2014 1540   CREATININE 1.22 (H) 08/14/2017 1346   CREATININE 1.1 12/08/2014 1540      Component Value Date/Time   CALCIUM 10.9 (H) 08/14/2017 1346   CALCIUM 9.9 12/08/2014 1540   ALKPHOS 48 08/14/2017 1346   ALKPHOS 42 12/08/2014 1540   AST 16 08/14/2017 1346   AST 23 12/08/2014 1540   ALT 13 08/14/2017 1346   ALT 21 12/08/2014 1540   BILITOT 0.6 08/14/2017 1346   BILITOT 0.65 12/08/2014 1540       Radiological Studies: No results found.  Impression:  1.  Myeloproliferative disorder-essential thrombocythemia positive JAK-2 Platelet count now well-controlled on low-dose Hydrea 500 mg daily. Plan: Continue the same.  I am still checking counts monthly.  If platelets remain stable at time of her 30-monthinterval follow-up with me, I will decrease blood counts to every other month.  2.  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 She remains on chronic anticoagulation.  Initially with Coumadin and low-dose aspirin.  Transition to Xarelto in 2018.  No new thrombotic events.  3.  Chronic anticoagulation and low-dose antiplatelet agent secondary to #2.  4.  Stage I, ER positive, HER-2 negative, cancer of the left breast treated as outlined above.  Now out almost 9 years from diagnosis in August 2010.  She will continue Arimidex for 1 more year.  Continue periodic exams and mammograms.  5.  Borderline hypercalcemia and borderline rise in her creatinine  likely due to combination of thiazide diuretic plus oral calcium supplement.  Holding her calcium supplement at this time.  She has low  blood pressure.  I will leave to the discretion of her primary care physician whether or not she should stop or modify the diuretic.  CC: Patient Care Team: EGaynelle Arabian MD as PCP - General (Family Medicine) GAnnia Belt MD as Consulting Physician (Oncology) WRolm Bookbinder MD as Consulting Physician (General Surgery)   JMurriel Hopper MD, [facility]     5/28/20199:21 AM

## 2017-08-25 NOTE — Patient Instructions (Signed)
CBC every month Repeat chemistry profile on June 16 to follow up calcium & kidney function MD visit 4 months

## 2017-09-14 ENCOUNTER — Other Ambulatory Visit (INDEPENDENT_AMBULATORY_CARE_PROVIDER_SITE_OTHER): Payer: Medicare Other

## 2017-09-14 DIAGNOSIS — Z7901 Long term (current) use of anticoagulants: Secondary | ICD-10-CM

## 2017-09-14 DIAGNOSIS — N182 Chronic kidney disease, stage 2 (mild): Secondary | ICD-10-CM | POA: Diagnosis not present

## 2017-09-14 DIAGNOSIS — D471 Chronic myeloproliferative disease: Secondary | ICD-10-CM | POA: Diagnosis not present

## 2017-09-14 DIAGNOSIS — K55069 Acute infarction of intestine, part and extent unspecified: Secondary | ICD-10-CM

## 2017-09-15 ENCOUNTER — Telehealth: Payer: Self-pay | Admitting: *Deleted

## 2017-09-15 LAB — CBC WITH DIFFERENTIAL/PLATELET
Basophils Absolute: 0 10*3/uL (ref 0.0–0.2)
Basos: 1 %
EOS (ABSOLUTE): 0.1 10*3/uL (ref 0.0–0.4)
Eos: 1 %
Hematocrit: 35.2 % (ref 34.0–46.6)
Hemoglobin: 11.6 g/dL (ref 11.1–15.9)
Immature Grans (Abs): 0 10*3/uL (ref 0.0–0.1)
Immature Granulocytes: 0 %
Lymphocytes Absolute: 0.8 10*3/uL (ref 0.7–3.1)
Lymphs: 19 %
MCH: 35.2 pg — ABNORMAL HIGH (ref 26.6–33.0)
MCHC: 33 g/dL (ref 31.5–35.7)
MCV: 107 fL — ABNORMAL HIGH (ref 79–97)
Monocytes Absolute: 0.3 10*3/uL (ref 0.1–0.9)
Monocytes: 6 %
Neutrophils Absolute: 3.2 10*3/uL (ref 1.4–7.0)
Neutrophils: 73 %
Platelets: 351 10*3/uL (ref 150–450)
RBC: 3.3 x10E6/uL — ABNORMAL LOW (ref 3.77–5.28)
RDW: 16 % — ABNORMAL HIGH (ref 12.3–15.4)
WBC: 4.4 10*3/uL (ref 3.4–10.8)

## 2017-09-15 LAB — COMPREHENSIVE METABOLIC PANEL
ALT: 10 IU/L (ref 0–32)
AST: 14 IU/L (ref 0–40)
Albumin/Globulin Ratio: 1.9 (ref 1.2–2.2)
Albumin: 4 g/dL (ref 3.5–4.8)
Alkaline Phosphatase: 40 IU/L (ref 39–117)
BUN/Creatinine Ratio: 26 (ref 12–28)
BUN: 31 mg/dL — ABNORMAL HIGH (ref 8–27)
Bilirubin Total: 0.4 mg/dL (ref 0.0–1.2)
CO2: 22 mmol/L (ref 20–29)
Calcium: 9.6 mg/dL (ref 8.7–10.3)
Chloride: 105 mmol/L (ref 96–106)
Creatinine, Ser: 1.19 mg/dL — ABNORMAL HIGH (ref 0.57–1.00)
GFR calc Af Amer: 52 mL/min/{1.73_m2} — ABNORMAL LOW (ref >59)
GFR calc non Af Amer: 45 mL/min/{1.73_m2} — ABNORMAL LOW (ref >59)
Globulin, Total: 2.1 g/dL (ref 1.5–4.5)
Glucose: 75 mg/dL (ref 65–99)
Potassium: 4.5 mmol/L (ref 3.5–5.2)
Sodium: 144 mmol/L (ref 134–144)
Total Protein: 6.1 g/dL (ref 6.0–8.5)

## 2017-09-15 LAB — LACTATE DEHYDROGENASE: LDH: 207 IU/L (ref 119–226)

## 2017-09-15 NOTE — Telephone Encounter (Signed)
-----   Message from Annia Belt, MD sent at 09/15/2017  8:51 AM EDT ----- Call pt: platelets good at 351,000. Calcium level back to normal.

## 2017-09-15 NOTE — Telephone Encounter (Signed)
Patient notified of results. Very appreciative. L. Clovia Reine, RN, BSN   

## 2017-10-01 ENCOUNTER — Encounter: Payer: Self-pay | Admitting: Oncology

## 2017-10-01 NOTE — Progress Notes (Signed)
Lady w essential thrombocythemia; prior mesenteric vascular thrombosis on chronic ASA, Xarelto, Hydrea. Had surgery on roof of mouth 7/1. Was advised by me to hold Xarelto x 24 hrs. Tues PM she began to have blood oozing intermittently from surgical site not completely better with local gauze pad compression. Husband emailed me 7/3 PM. I advised holding ASA. Continue Xarelto. Advised to call if persistent bleeding & I will prescribe Tranexamic acid oral rinse 1 gm TID for 5-7 days. I called her this AM. Bleeding has now stopped. I told her to stay off ASA for 1 week.

## 2017-10-12 ENCOUNTER — Other Ambulatory Visit (INDEPENDENT_AMBULATORY_CARE_PROVIDER_SITE_OTHER): Payer: Medicare Other

## 2017-10-12 DIAGNOSIS — Z7901 Long term (current) use of anticoagulants: Secondary | ICD-10-CM

## 2017-10-12 DIAGNOSIS — K55069 Acute infarction of intestine, part and extent unspecified: Secondary | ICD-10-CM

## 2017-10-12 DIAGNOSIS — D471 Chronic myeloproliferative disease: Secondary | ICD-10-CM

## 2017-10-13 ENCOUNTER — Telehealth: Payer: Self-pay | Admitting: *Deleted

## 2017-10-13 LAB — CBC WITH DIFFERENTIAL/PLATELET
Basophils Absolute: 0.1 10*3/uL (ref 0.0–0.2)
Basos: 1 %
EOS (ABSOLUTE): 0 10*3/uL (ref 0.0–0.4)
Eos: 1 %
Hematocrit: 35.7 % (ref 34.0–46.6)
Hemoglobin: 11.7 g/dL (ref 11.1–15.9)
Immature Grans (Abs): 0 10*3/uL (ref 0.0–0.1)
Immature Granulocytes: 0 %
Lymphocytes Absolute: 1.1 10*3/uL (ref 0.7–3.1)
Lymphs: 15 %
MCH: 35.6 pg — ABNORMAL HIGH (ref 26.6–33.0)
MCHC: 32.8 g/dL (ref 31.5–35.7)
MCV: 109 fL — ABNORMAL HIGH (ref 79–97)
Monocytes Absolute: 0.2 10*3/uL (ref 0.1–0.9)
Monocytes: 3 %
Neutrophils Absolute: 5.8 10*3/uL (ref 1.4–7.0)
Neutrophils: 80 %
Platelets: 360 10*3/uL (ref 150–450)
RBC: 3.29 x10E6/uL — ABNORMAL LOW (ref 3.77–5.28)
RDW: 15.1 % (ref 12.3–15.4)
WBC: 7.2 10*3/uL (ref 3.4–10.8)

## 2017-10-13 NOTE — Telephone Encounter (Signed)
Pt called / informed "counts good; platelets 360,000; Hb stable at 11.7. stay on current dose of Hydrea " per Dr Beryle Beams. Stated ok and she had already looked at results this morning.

## 2017-10-13 NOTE — Telephone Encounter (Signed)
-----   Message from Annia Belt, MD sent at 10/13/2017  8:50 AM EDT ----- Call pt: counts good; platelets 360,000; Hb stable at 11.7. stay on current dose of Hydrea

## 2017-11-16 ENCOUNTER — Other Ambulatory Visit (INDEPENDENT_AMBULATORY_CARE_PROVIDER_SITE_OTHER): Payer: Medicare Other

## 2017-11-16 DIAGNOSIS — D471 Chronic myeloproliferative disease: Secondary | ICD-10-CM

## 2017-11-16 DIAGNOSIS — K55069 Acute infarction of intestine, part and extent unspecified: Secondary | ICD-10-CM

## 2017-11-16 DIAGNOSIS — Z7901 Long term (current) use of anticoagulants: Secondary | ICD-10-CM | POA: Diagnosis not present

## 2017-11-17 ENCOUNTER — Telehealth: Payer: Self-pay | Admitting: *Deleted

## 2017-11-17 LAB — CBC WITH DIFFERENTIAL/PLATELET
Basophils Absolute: 0.1 10*3/uL (ref 0.0–0.2)
Basos: 1 %
EOS (ABSOLUTE): 0.1 10*3/uL (ref 0.0–0.4)
Eos: 2 %
Hematocrit: 38.8 % (ref 34.0–46.6)
Hemoglobin: 12.5 g/dL (ref 11.1–15.9)
Immature Grans (Abs): 0 10*3/uL (ref 0.0–0.1)
Immature Granulocytes: 0 %
Lymphocytes Absolute: 1 10*3/uL (ref 0.7–3.1)
Lymphs: 19 %
MCH: 34.2 pg — ABNORMAL HIGH (ref 26.6–33.0)
MCHC: 32.2 g/dL (ref 31.5–35.7)
MCV: 106 fL — ABNORMAL HIGH (ref 79–97)
Monocytes Absolute: 0.3 10*3/uL (ref 0.1–0.9)
Monocytes: 6 %
Neutrophils Absolute: 3.9 10*3/uL (ref 1.4–7.0)
Neutrophils: 72 %
Platelets: 339 10*3/uL (ref 150–450)
RBC: 3.66 x10E6/uL — ABNORMAL LOW (ref 3.77–5.28)
RDW: 15.4 % (ref 12.3–15.4)
WBC: 5.4 10*3/uL (ref 3.4–10.8)

## 2017-11-17 NOTE — Telephone Encounter (Signed)
-----   Message from Annia Belt, MD sent at 11/17/2017  8:12 AM EDT ----- Call pt: counts good; platelets 339,000. Continue current dose of Hydrea

## 2017-11-17 NOTE — Telephone Encounter (Signed)
Called pt - talked to her husband. Stated they had seen results this morning on My Chart. Informed "counts good; platelets 339,000. Continue current dose of Hydrea" per Dr Beryle Beams. Stated he will let her to stay on same dose of hydroxyurea.

## 2017-11-23 ENCOUNTER — Other Ambulatory Visit: Payer: Self-pay | Admitting: Family Medicine

## 2017-11-23 ENCOUNTER — Ambulatory Visit
Admission: RE | Admit: 2017-11-23 | Discharge: 2017-11-23 | Disposition: A | Discharge status: Home / Self Care | Payer: Medicare Other | Source: Ambulatory Visit | Attending: Family Medicine | Admitting: Family Medicine

## 2017-11-23 DIAGNOSIS — K219 Gastro-esophageal reflux disease without esophagitis: Secondary | ICD-10-CM | POA: Diagnosis not present

## 2017-11-23 DIAGNOSIS — N183 Chronic kidney disease, stage 3 (moderate): Secondary | ICD-10-CM | POA: Diagnosis not present

## 2017-11-23 DIAGNOSIS — I129 Hypertensive chronic kidney disease with stage 1 through stage 4 chronic kidney disease, or unspecified chronic kidney disease: Secondary | ICD-10-CM | POA: Diagnosis not present

## 2017-11-23 DIAGNOSIS — R0609 Other forms of dyspnea: Secondary | ICD-10-CM | POA: Diagnosis not present

## 2017-11-23 DIAGNOSIS — M810 Age-related osteoporosis without current pathological fracture: Secondary | ICD-10-CM | POA: Diagnosis not present

## 2017-11-23 DIAGNOSIS — E559 Vitamin D deficiency, unspecified: Secondary | ICD-10-CM | POA: Diagnosis not present

## 2017-11-23 DIAGNOSIS — C50919 Malignant neoplasm of unspecified site of unspecified female breast: Secondary | ICD-10-CM | POA: Diagnosis not present

## 2017-11-23 DIAGNOSIS — E7800 Pure hypercholesterolemia, unspecified: Secondary | ICD-10-CM | POA: Diagnosis not present

## 2017-11-23 DIAGNOSIS — R0600 Dyspnea, unspecified: Secondary | ICD-10-CM

## 2017-11-23 DIAGNOSIS — D692 Other nonthrombocytopenic purpura: Secondary | ICD-10-CM | POA: Diagnosis not present

## 2017-11-23 DIAGNOSIS — R0602 Shortness of breath: Secondary | ICD-10-CM | POA: Diagnosis not present

## 2017-11-23 DIAGNOSIS — C946 Myelodysplastic disease, not classified: Secondary | ICD-10-CM | POA: Diagnosis not present

## 2017-11-23 DIAGNOSIS — I81 Portal vein thrombosis: Secondary | ICD-10-CM | POA: Diagnosis not present

## 2017-11-26 ENCOUNTER — Other Ambulatory Visit (HOSPITAL_COMMUNITY): Payer: Self-pay | Admitting: Family Medicine

## 2017-11-26 ENCOUNTER — Other Ambulatory Visit: Payer: Self-pay

## 2017-11-26 ENCOUNTER — Ambulatory Visit (HOSPITAL_COMMUNITY): Payer: Medicare Other | Attending: Cardiology

## 2017-11-26 DIAGNOSIS — R0609 Other forms of dyspnea: Secondary | ICD-10-CM

## 2017-11-26 DIAGNOSIS — I351 Nonrheumatic aortic (valve) insufficiency: Secondary | ICD-10-CM | POA: Diagnosis not present

## 2017-11-26 DIAGNOSIS — Z853 Personal history of malignant neoplasm of breast: Secondary | ICD-10-CM | POA: Diagnosis not present

## 2017-11-26 DIAGNOSIS — R0600 Dyspnea, unspecified: Secondary | ICD-10-CM

## 2017-12-02 DIAGNOSIS — H1013 Acute atopic conjunctivitis, bilateral: Secondary | ICD-10-CM | POA: Diagnosis not present

## 2017-12-14 ENCOUNTER — Other Ambulatory Visit (INDEPENDENT_AMBULATORY_CARE_PROVIDER_SITE_OTHER): Payer: Medicare Other

## 2017-12-14 DIAGNOSIS — D471 Chronic myeloproliferative disease: Secondary | ICD-10-CM | POA: Diagnosis not present

## 2017-12-14 DIAGNOSIS — Z7901 Long term (current) use of anticoagulants: Secondary | ICD-10-CM | POA: Diagnosis not present

## 2017-12-14 DIAGNOSIS — K55069 Acute infarction of intestine, part and extent unspecified: Secondary | ICD-10-CM

## 2017-12-15 LAB — CBC WITH DIFFERENTIAL/PLATELET
Basophils Absolute: 0.1 10*3/uL (ref 0.0–0.2)
Basos: 1 %
EOS (ABSOLUTE): 0.1 10*3/uL (ref 0.0–0.4)
Eos: 1 %
Hematocrit: 35.9 % (ref 34.0–46.6)
Hemoglobin: 12.3 g/dL (ref 11.1–15.9)
Immature Grans (Abs): 0 10*3/uL (ref 0.0–0.1)
Immature Granulocytes: 1 %
Lymphocytes Absolute: 0.9 10*3/uL (ref 0.7–3.1)
Lymphs: 15 %
MCH: 36 pg — ABNORMAL HIGH (ref 26.6–33.0)
MCHC: 34.3 g/dL (ref 31.5–35.7)
MCV: 105 fL — ABNORMAL HIGH (ref 79–97)
Monocytes Absolute: 0.3 10*3/uL (ref 0.1–0.9)
Monocytes: 6 %
Neutrophils Absolute: 4.2 10*3/uL (ref 1.4–7.0)
Neutrophils: 76 %
Platelets: 309 10*3/uL (ref 150–450)
RBC: 3.42 x10E6/uL — ABNORMAL LOW (ref 3.77–5.28)
RDW: 13.8 % (ref 12.3–15.4)
WBC: 5.6 10*3/uL (ref 3.4–10.8)

## 2017-12-16 ENCOUNTER — Telehealth: Payer: Self-pay | Admitting: *Deleted

## 2017-12-16 NOTE — Telephone Encounter (Signed)
Pt called / informed "counts good. Platelets 309,000. Stay on same dose Hydrea" per Dr Beryle Beams. Pt verbalized understanding and stated she will see [place] on Monday.

## 2017-12-16 NOTE — Telephone Encounter (Signed)
-----   Message from Annia Belt, MD sent at 12/15/2017  9:09 AM EDT ----- Call pt: counts good. Platelets 309,000. Stay on same dose Hydrea

## 2017-12-18 ENCOUNTER — Telehealth: Payer: Self-pay | Admitting: *Deleted

## 2017-12-18 NOTE — Telephone Encounter (Signed)
Talked to pt - informed this week labs are great and ok to change appt to Oct 22 per Dr Darnell Level. Stated she will let me know if she decides to go out of town or not ; if not she will like to keep Monday's appt.

## 2017-12-18 NOTE — Telephone Encounter (Signed)
-----   Message from Annia Belt, MD sent at 12/18/2017 10:37 AM EDT ----- No problem. Counts done this week looked great. ----- Message ----- From: Ebbie Latus, RN Sent: 12/18/2017  10:15 AM EDT To: Annia Belt, MD  Dr Darnell Level Pt needs to go out of town  And has an appt on Monday. Your next available appt will be Oct 22 and she cannot come 9/30 d/t jewish holiday. She wants to be sure it's ok to wait until Oct 22. Thales Knipple

## 2017-12-18 NOTE — Telephone Encounter (Signed)
Pt called to say she is going to cancel the other appt but she is keeping the 10/22 appt

## 2017-12-21 ENCOUNTER — Encounter: Payer: Medicare Other | Admitting: Oncology

## 2017-12-22 ENCOUNTER — Other Ambulatory Visit: Payer: Self-pay | Admitting: Oncology

## 2017-12-22 DIAGNOSIS — K55069 Acute infarction of intestine, part and extent unspecified: Secondary | ICD-10-CM

## 2017-12-22 DIAGNOSIS — Z7901 Long term (current) use of anticoagulants: Secondary | ICD-10-CM

## 2017-12-22 DIAGNOSIS — D471 Chronic myeloproliferative disease: Secondary | ICD-10-CM

## 2018-01-01 ENCOUNTER — Ambulatory Visit (INDEPENDENT_AMBULATORY_CARE_PROVIDER_SITE_OTHER): Payer: Medicare Other | Admitting: Cardiovascular Disease

## 2018-01-01 ENCOUNTER — Encounter: Payer: Self-pay | Admitting: Cardiovascular Disease

## 2018-01-01 VITALS — BP 90/56 | HR 66 | Ht 61.0 in | Wt 106.0 lb

## 2018-01-01 DIAGNOSIS — E7800 Pure hypercholesterolemia, unspecified: Secondary | ICD-10-CM | POA: Diagnosis not present

## 2018-01-01 DIAGNOSIS — I10 Essential (primary) hypertension: Secondary | ICD-10-CM | POA: Diagnosis not present

## 2018-01-01 DIAGNOSIS — R0600 Dyspnea, unspecified: Secondary | ICD-10-CM

## 2018-01-01 DIAGNOSIS — R0609 Other forms of dyspnea: Secondary | ICD-10-CM

## 2018-01-01 NOTE — Progress Notes (Signed)
Cardiology Office Note   Date:  01/04/2018   ID:  Gwendolyn Williams, DOB 1941/02/12, MRN [id number]  PCP:  Gaynelle Arabian, MD  Cardiologist:   Skeet Latch, MD   Chief Complaint  Patient presents with  . Follow-up  . Shortness of Breath    Ongoing on exertion.      History of Present Illness: Gwendolyn Williams is a 77 y.o. 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  Gwendolyn Williams saw Dr. Hughie Closs on 8/26 and reported exertional dyspnea when lifting weights or walking up hills.  She had a chest x-ray that showed some changes consistent with asthma.  She was referred for an echo 10/2017 that revealed LVEF 55 to 60% with grade 1 diastolic dysfunction.  She was referred to cardiology for further evaluation.  She has been on Arimadex for the last 10 years and initially thought her shortness of breath was attributable to this medication. However, in the last 5-6 months she has been increasingly short of breath.  She notices this when walking up stairs or when doing chores around the house.  She is very active and goes to [employer] 5 days per week.  Lately she is more short of breath with her exercise.  She has no exertional chest pain.  She does have pain in her R hip and previously had her R hip replaced.  In 2010 she developed a mesenteric thrombus and was ultimately found to have myeloproliferative disorder.  She has been on anticoagulation since that time, initially on warfarin and now Xarelto in addition to aspirin.  She has no lower extremity edema, orthopnea or PND.  She has note that her BP is low lately.  She has no lightheadedness or dizziness with exertion or with positional changes.     Past Medical History:  Diagnosis Date  . Arthritis   . Asthma    controlled with singulair  . Breast cancer  ([place])    stage I left breast  . Current use of long term anticoagulation 04/21/2017  . Dyspnea, unspecified 02/24/2017   Started coincident with Hydrea, exertional and orthostatic, normal exam. O2 sat 100%.  . Family history of breast cancer   . GERD (gastroesophageal reflux disease)   . Hematoma of arm 08/09/2012  . Hypercoagulable state, primary ([place])    JAK2 gene defect, thrombocythemia  . Hyperlipidemia   . Hypertension   . 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 Pneumonia    viral pneumonia as a kid  . PONV (postoperative nausea and vomiting)     Past Surgical History:  Procedure Laterality Date  . BREAST SURGERY     Left breast lumpectomy sentinel node biopsy  . CERVICAL SPINE SURGERY  05/23/2008  . COLECTOMY  04/11/2008   left colectomy with end colostomy due to clot  . COLOSTOMY TAKEDOWN  08/14/2008  . EYE SURGERY     bil cataracts  . HERNIA REPAIR  2011   [facility]  . [place] SURGERY  2006  . TOTAL HIP ARTHROPLASTY Left 10/07/2016   Procedure: LEFT TOTAL HIP ARTHROPLASTY ANTERIOR APPROACH;  Surgeon: Paralee Cancel, MD;  Location: [facility];  Service: Orthopedics;  Laterality: Left;  70 mins     Current Outpatient Medications  Medication Sig Dispense Refill  . anastrozole (ARIMIDEX)  1 MG tablet Take 1 mg by mouth daily.     Marland Kitchen aspirin 81 MG tablet Take 81 mg by mouth daily.      . Cholecalciferol (VITAMIN D) 2000 units CAPS Take 2,000 Units by mouth daily.    Marland Kitchen docusate sodium (COLACE) 100 MG capsule Take 1 capsule (100 mg total) by mouth 2 (two) times daily. 10 capsule 0  . Esomeprazole Magnesium (NEXIUM PO) Take 40 mg by mouth daily.     . folic acid (FOLVITE) 1 MG tablet Take 1 mg by mouth daily.    . hydroxyurea (HYDREA) 500 MG capsule Take 1 capsule (500 mg total) by mouth daily. May take with food to minimize GI side effects. 90 capsule 3  . losartan (COZAAR) 100 MG tablet Take 100 mg by mouth daily.      . montelukast  (SINGULAIR) 10 MG tablet Take 1 tablet by mouth at bedtime.     . ondansetron (ZOFRAN) 4 MG tablet Take 1 tablet (4 mg total) by mouth every 6 (six) hours as needed for nausea. 30 tablet 0  . polyethylene glycol (MIRALAX / GLYCOLAX) packet Take 17 g by mouth 2 (two) times daily. 14 each 0  . rivaroxaban (XARELTO) 20 MG TABS tablet Take 1 tablet (20 mg total) by mouth daily with supper. 90 tablet 3  . simvastatin (ZOCOR) 20 MG tablet Take 20 mg by mouth daily at 6 PM.     . triamterene-hydrochlorothiazide (MAXZIDE-25) 37.5-25 MG tablet Take 1 tablet by mouth daily.     No current facility-administered medications for this visit.     Allergies:   Penicillins and Sulfa antibiotics    Social History:  The patient  reports that she quit smoking about 54 years ago. She has never used smokeless tobacco. She reports that she drinks alcohol. She reports that she does not use drugs.   Family History:  The patient's family history includes Basal cell carcinoma in her father; Bladder Cancer in her cousin; Breast cancer (age of onset: 70) in her mother; Breast cancer (age of onset: 12) in her maternal aunt and maternal aunt; Breast cancer (age of onset: 31) in her paternal aunt; CAD in her father; Cancer in her other; Dementia in her sister; Heart disease in her father; Lung cancer in her mother; Other (age of onset: 84) in her daughter; Ovarian cysts in her maternal grandmother and sister; Polycystic ovary syndrome in her daughter.    ROS:  Please see the history of present illness.   Otherwise, review of systems are positive for none.   All other systems are reviewed and negative.    PHYSICAL EXAM: VS:  BP (!) 90/56 (BP Location: Left Arm, Patient Position: Sitting, Cuff Size: Normal)   Pulse 66   Ht 5\' 1"  (1.549 m)   Wt 106 lb (48.1 kg)   BMI 20.03 kg/m  , BMI Body mass index is 20.03 kg/m. GENERAL:  Well appearing HEENT:  Pupils equal round and reactive, fundi not visualized, oral mucosa  unremarkable NECK:  No jugular venous distention, waveform within normal limits, carotid upstroke brisk and symmetric, no bruits, no thyromegaly LUNGS:  Clear to auscultation bilaterally HEART:  RRR.  PMI not displaced or sustained,S1 and S2 within normal limits, no S3, no S4, no clicks, no rubs, no murmurs ABD:  Flat, positive bowel sounds normal in frequency in pitch, no bruits, no rebound, no guarding, no midline pulsatile mass, no hepatomegaly, no splenomegaly EXT:  2 plus pulses throughout, no  edema, no cyanosis no clubbing SKIN:  No rashes no nodules NEURO:  Cranial nerves II through XII grossly intact, motor grossly intact throughout PSYCH:  Cognitively intact, oriented to person place and time   EKG:  EKG is ordered today. The ekg ordered today demonstrates sinus rhythm.  Rate 66 bpm.    Echo 11/26/2017: Study Conclusions  - Left ventricle: The cavity size was normal. There was mild focal   basal hypertrophy of the septum. Systolic function was normal.   The estimated ejection fraction was in the range of 55% to 60%.   Wall motion was normal; there were no regional wall motion   abnormalities. Doppler parameters are consistent with abnormal   left ventricular relaxation (grade 1 diastolic dysfunction). - Aortic valve: There was mild regurgitation.  Recent Labs: 09/14/2017: ALT 10; BUN 31; Creatinine, Ser 1.19; Potassium 4.5; Sodium 144 12/14/2017: Hemoglobin 12.3; Platelets 309   11/23/2017: Total cholesterol 200, triglycerides 151, HDL 57, LDL 113 Sodium 142, potassium 4.6, BUN 31, creatinine 1.3 AST 15, ALT 12    Lipid Panel    Component Value Date/Time   TRIG 109 04/17/2008 0425      Wt Readings from Last 3 Encounters:  01/01/18 106 lb (48.1 kg)  08/25/17 106 lb 12.8 oz (48.4 kg)  04/22/17 106 lb (48.1 kg)      ASSESSMENT AND PLAN:  # Exertional dyspnea: Risk factors for CAD include hypertension and hyperlipidemia.  Her symptoms are atypical.  We will get  an ETT to assess for ischemia. She is euvolemic and has no evidence of heart failure.   # myeloproliferative disorder: # Prior mesenteric thrombus: On aspirin and Xarelto. She never failure anticoagulation and will discuss the need to continue aspirin with Dr. Sanjuana Letters.  She may be on this for secondary preventiof of thrombotic disease but does not need it for CV risk reduction in addition to the Xarelto.  # Hyperlipidemia: Continue simvastatin.  # Hypertension: BP has been low but she is asymptomatic.  Continue losartan and HCTZ-triamterene.  Continue to monitor.     Current medicines are reviewed at length with the patient today.  The patient does not have concerns regarding medicines.  The following changes have been made:  no change  Labs/ tests ordered today include:   Orders Placed This Encounter  Procedures  . Exercise Tolerance Test  . EKG 12-Lead     Disposition:   FU with Yareli Carthen C. Oval Linsey, MD, [facility] as needed.    Signed, Dozier Berkovich C. Oval Linsey, MD, [facility]  01/04/2018 4:48 PM    [place]

## 2018-01-01 NOTE — Patient Instructions (Signed)
Medication Instructions:  Your physician recommends that you continue on your current medications as directed. Please refer to the Current Medication list given to you today.   Lab work: noen  Testing/Procedures: Your physician has requested that you have an exercise tolerance test. For further information please visit [email or web address]. Please also follow instruction sheet, as given.   Follow-Up: As needed   Any Other Special Instructions Will Be Listed Below (If Applicable).   Exercise Stress Electrocardiogram An exercise stress electrocardiogram is a test to check how blood flows to your heart. It is done to find areas of poor blood flow. You will need to walk on a treadmill for this test. The electrocardiogram will record your heartbeat when you are at rest and when you are exercising. What happens before the procedure?  Do not have drinks with caffeine or foods with caffeine for 24 hours before the test, or as told by your doctor. This includes coffee, tea (even decaf tea), sodas, chocolate, and cocoa.  Follow your doctor's instructions about eating and drinking before the test.  Ask your doctor what medicines you should or should not take before the test. Take your medicines with water unless told by your doctor not to.  If you use an inhaler, bring it with you to the test.  Bring a snack to eat after the test.  Do not  smoke for 4 hours before the test.  Do not put lotions, powders, creams, or oils on your chest before the test.  Wear comfortable shoes and clothing. What happens during the procedure?  You will have patches put on your chest. Small areas of your chest may need to be shaved. Wires will be connected to the patches.  Your heart rate will be watched while you are resting and while you are exercising.  You will walk on the treadmill. The treadmill will slowly get faster to raise your heart rate.  The test will take about 1-2 hours. What happens after  the procedure?  Your heart rate and blood pressure will be watched after the test.  You may return to your normal diet, activities, and medicines or as told by your doctor. This information is not intended to replace advice given to you by your health care provider. Make sure you discuss any questions you have with your health care provider. Document Released: 09/03/2007 Document Revised: 11/14/2015 Document Reviewed: 11/22/2012 Elsevier Interactive Patient Education  [employer].

## 2018-01-04 ENCOUNTER — Encounter: Payer: Self-pay | Admitting: Cardiovascular Disease

## 2018-01-05 ENCOUNTER — Telehealth (HOSPITAL_COMMUNITY): Payer: Self-pay

## 2018-01-05 NOTE — Telephone Encounter (Signed)
Encounter complete. 

## 2018-01-07 ENCOUNTER — Ambulatory Visit (HOSPITAL_COMMUNITY)
Admission: RE | Admit: 2018-01-07 | Discharge: 2018-01-07 | Disposition: A | Discharge status: Home / Self Care | Payer: Medicare Other | Source: Ambulatory Visit | Attending: Internal Medicine | Admitting: Internal Medicine

## 2018-01-07 DIAGNOSIS — R0609 Other forms of dyspnea: Secondary | ICD-10-CM | POA: Insufficient documentation

## 2018-01-07 DIAGNOSIS — R0600 Dyspnea, unspecified: Secondary | ICD-10-CM

## 2018-01-07 LAB — EXERCISE TOLERANCE TEST
Estimated workload: 4.3 METS
Exercise duration (min): 1 min
Exercise duration (sec): 52 s
MPHR: 145 {beats}/min
Peak HR: 126 {beats}/min
Percent HR: 86 %
RPE: 19
Rest HR: 72 {beats}/min

## 2018-01-08 ENCOUNTER — Telehealth: Payer: Self-pay | Admitting: Cardiovascular Disease

## 2018-01-08 NOTE — Telephone Encounter (Signed)
New Message   Patient is calling to obtain results of her ETT. Please call to discuss.

## 2018-01-08 NOTE — Telephone Encounter (Signed)
Spoke with pt. Pt aware of ETT results.  Notes recorded by Skeet Latch, MD on 01/07/2018 at 1:31 PM EDT Low risk stress test. Pt sts that she was not able to exercise to capacity is wondering if additional testing is needed or if she needs to f/u with Dr.[place]. Adv pt that I will fwd the message to [place] and her nurse.

## 2018-01-11 NOTE — Telephone Encounter (Signed)
Spoke with pt, she would like an appointment to discuss ETT. Follow up scheduled

## 2018-01-11 NOTE — Telephone Encounter (Signed)
New message:       Pt is calling and states she needs to speak to someone regarding her last ETT and needs to know when she needs to follow up with [place].

## 2018-01-12 ENCOUNTER — Other Ambulatory Visit: Payer: Medicare Other

## 2018-01-12 ENCOUNTER — Encounter: Payer: Self-pay | Admitting: Cardiovascular Disease

## 2018-01-12 ENCOUNTER — Other Ambulatory Visit (INDEPENDENT_AMBULATORY_CARE_PROVIDER_SITE_OTHER): Payer: Medicare Other

## 2018-01-12 ENCOUNTER — Ambulatory Visit (INDEPENDENT_AMBULATORY_CARE_PROVIDER_SITE_OTHER): Payer: Medicare Other | Admitting: Cardiovascular Disease

## 2018-01-12 ENCOUNTER — Other Ambulatory Visit: Payer: Self-pay | Admitting: Oncology

## 2018-01-12 ENCOUNTER — Telehealth: Payer: Self-pay | Admitting: *Deleted

## 2018-01-12 VITALS — BP 132/81 | HR 70 | Ht 59.0 in | Wt 109.0 lb

## 2018-01-12 DIAGNOSIS — I119 Hypertensive heart disease without heart failure: Secondary | ICD-10-CM

## 2018-01-12 DIAGNOSIS — R9439 Abnormal result of other cardiovascular function study: Secondary | ICD-10-CM

## 2018-01-12 DIAGNOSIS — K55069 Acute infarction of intestine, part and extent unspecified: Secondary | ICD-10-CM

## 2018-01-12 DIAGNOSIS — Z7901 Long term (current) use of anticoagulants: Secondary | ICD-10-CM

## 2018-01-12 DIAGNOSIS — I10 Essential (primary) hypertension: Secondary | ICD-10-CM

## 2018-01-12 DIAGNOSIS — D471 Chronic myeloproliferative disease: Secondary | ICD-10-CM

## 2018-01-12 DIAGNOSIS — R0602 Shortness of breath: Secondary | ICD-10-CM

## 2018-01-12 DIAGNOSIS — Z01812 Encounter for preprocedural laboratory examination: Secondary | ICD-10-CM | POA: Diagnosis not present

## 2018-01-12 MED ORDER — METOPROLOL TARTRATE 50 MG PO TABS: ORAL_TABLET | ORAL | 0 refills | Status: DC

## 2018-01-12 NOTE — Progress Notes (Signed)
Cardiology Office Note   Date:  01/12/2018   ID:  Gwendolyn Williams, DOB 04-Mar-1941, MRN [id number]  PCP:  Gwendolyn Arabian, MD  Cardiologist:   Gwendolyn Latch, MD   Chief Complaint  Patient presents with  . Follow-up     History of Present Illness: Gwendolyn Williams is a 77 y.o. 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  She was initially seen 10/4 for an evaluation of shortness of breath.  Gwendolyn Williams saw Dr. Hughie Williams on 8/26 and reported exertional dyspnea when lifting weights or walking up hills.  She had a chest x-ray that showed some changes consistent with asthma.  She was referred for an echo 10/2017 that revealed LVEF 55 to 60% with grade 1 diastolic dysfunction.  She was referred to cardiology for further evaluation.  She has been on Arimadex for the last 10 years and initially thought her shortness of breath was attributable to this medication. However, in the last 5-6 months she has been increasingly short of breath.  She notices this when walking up stairs or when doing chores around the house.  She is very active and goes to [employer] 5 days per week.  She was referred for an ETT 01/07/18 that was negative for ischemia.  She achieved 4.3 METS.  She is frustrated because she continues to have exertional dyspnea with minimal activity.  She has no lower extremity edema, orthopnea or PND.  She never misses her anticoagulant.  She also hasn't had any recent long car rides or flights.    Past Medical History:  Diagnosis Date  . Arthritis   . Asthma    controlled with singulair  . Breast cancer ([place])    stage I left breast  . Current use of long term anticoagulation 04/21/2017  . Dyspnea, unspecified 02/24/2017   Started coincident with Hydrea, exertional and orthostatic, normal exam. O2 sat 100%.  . Family history of  breast cancer   . GERD (gastroesophageal reflux disease)   . Hematoma of arm 08/09/2012  . 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 Hyperlipidemia   . Hypertension   . Ischemic colon ([place])    03/2008  . 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 Pneumonia    viral pneumonia as a kid  . PONV (postoperative nausea and vomiting)     Past Surgical History:  Procedure Laterality Date  . BREAST SURGERY     Left breast lumpectomy sentinel node biopsy  . CERVICAL SPINE SURGERY  05/23/2008  . COLECTOMY  04/11/2008   left colectomy with end colostomy due to clot  . COLOSTOMY TAKEDOWN  08/14/2008  . EYE SURGERY     bil cataracts  . HERNIA REPAIR  2011   [facility]  . [place] SURGERY  2006  . TOTAL HIP ARTHROPLASTY Left 10/07/2016   Procedure: LEFT TOTAL HIP ARTHROPLASTY ANTERIOR APPROACH;  Surgeon: Gwendolyn Cancel, MD;  Location: [facility];  Service: Orthopedics;  Laterality: Left;  70 mins     Current Outpatient Medications  Medication Sig Dispense Refill  . anastrozole (ARIMIDEX) 1 MG tablet Take 1 mg by mouth daily.     Marland Kitchen aspirin 81 MG tablet Take 81 mg by mouth daily.      . Cholecalciferol (VITAMIN D) 2000 units CAPS Take 2,000 Units by mouth daily.    Marland Kitchen  docusate sodium (COLACE) 100 MG capsule Take 1 capsule (100 mg total) by mouth 2 (two) times daily. 10 capsule 0  . Esomeprazole Magnesium (NEXIUM PO) Take 40 mg by mouth daily.     . folic acid (FOLVITE) 1 MG tablet Take 1 mg by mouth daily.    . hydroxyurea (HYDREA) 500 MG capsule Take 1 capsule (500 mg total) by mouth daily. May take with food to minimize GI side effects. 90 capsule 3  . losartan (COZAAR) 100 MG tablet Take 100 mg by mouth daily.      . montelukast (SINGULAIR) 10 MG tablet Take 1 tablet by mouth at bedtime.     . ondansetron (ZOFRAN) 4 MG tablet Take 1 tablet (4 mg total) by mouth every 6 (six) hours as needed for nausea. 30 tablet 0  . polyethylene glycol (MIRALAX / GLYCOLAX)  packet Take 17 g by mouth 2 (two) times daily. 14 each 0  . rivaroxaban (XARELTO) 20 MG TABS tablet Take 1 tablet (20 mg total) by mouth daily with supper. 90 tablet 3  . simvastatin (ZOCOR) 20 MG tablet Take 20 mg by mouth daily at 6 PM.     . triamterene-hydrochlorothiazide (MAXZIDE-25) 37.5-25 MG tablet Take 1 tablet by mouth daily.    . metoprolol tartrate (LOPRESSOR) 50 MG tablet TAKE 1 TABLET BY MOUTH 2 HOURS PRIOR TO CT 1 tablet 0   No current facility-administered medications for this visit.     Allergies:   Penicillins and Sulfa antibiotics    Social History:  The patient  reports that she quit smoking about 54 years ago. She has never used smokeless tobacco. She reports that she drinks alcohol. She reports that she does not use drugs.   Family History:  The patient's family history includes Basal cell carcinoma in her father; Bladder Cancer in her cousin; Breast cancer (age of onset: 25) in her mother; Breast cancer (age of onset: 31) in her maternal aunt and maternal aunt; Breast cancer (age of onset: 85) in her paternal aunt; CAD in her father; Cancer in her other; Dementia in her sister; Heart disease in her father; Lung cancer in her mother; Other (age of onset: 32) in her daughter; Ovarian cysts in her maternal grandmother and sister; Polycystic ovary syndrome in her daughter.    ROS:  Please see the history of present illness.   Otherwise, review of systems are positive for none.   All other systems are reviewed and negative.    PHYSICAL EXAM: VS:  BP 132/81   Pulse 70   Ht 4\' 11"  (1.499 m)   Wt 109 lb (49.4 kg)   BMI 22.02 kg/m  , BMI Body mass index is 22.02 kg/m. GENERAL:  Well appearing HEENT:  Pupils equal round and reactive, fundi not visualized, oral mucosa unremarkable NECK:  No jugular venous distention, waveform within normal limits, carotid upstroke brisk and symmetric, no bruits, no thyromegaly LUNGS:  Clear to auscultation bilaterally HEART:  RRR.  PMI not  displaced or sustained,S1 and S2 within normal limits, no S3, no S4, no clicks, no rubs, II/VI systolic murmur at the LUSB. ABD:  Flat, positive bowel sounds normal in frequency in pitch, no bruits, no rebound, no guarding, no midline pulsatile mass, no hepatomegaly, no splenomegaly EXT:  2 plus pulses throughout, no edema, no cyanosis no clubbing SKIN:  No rashes no nodules NEURO:  Cranial nerves II through XII grossly intact, motor grossly intact throughout PSYCH:  Cognitively intact, oriented to person place  and time   EKG:  EKG is not ordered today. The ekg ordered 01/01/18 demonstrates sinus rhythm.  Rate 66 bpm.    Echo 11/26/2017: Study Conclusions  - Left ventricle: The cavity size was normal. There was mild focal   basal hypertrophy of the septum. Systolic function was normal.   The estimated ejection fraction was in the range of 55% to 60%.   Wall motion was normal; there were no regional wall motion   abnormalities. Doppler parameters are consistent with abnormal   left ventricular relaxation (grade 1 diastolic dysfunction). - Aortic valve: There was mild regurgitation.  ETT 01/07/18:  Blood pressure demonstrated a hypotensive response to exercise.  There was no ST segment deviation noted during stress.  No T wave inversion was noted during stress.  Poor exercise capacity.  Negative, adequate stress test.     Recent Labs: 09/14/2017: ALT 10; BUN 31; Creatinine, Ser 1.19; Potassium 4.5; Sodium 144 12/14/2017: Hemoglobin 12.3; Platelets 309   11/23/2017: Total cholesterol 200, triglycerides 151, HDL 57, LDL 113 Sodium 142, potassium 4.6, BUN 31, creatinine 1.3 AST 15, ALT 12    Lipid Panel    Component Value Date/Time   TRIG 109 04/17/2008 0425      Wt Readings from Last 3 Encounters:  01/12/18 109 lb (49.4 kg)  01/01/18 106 lb (48.1 kg)  08/25/17 106 lb 12.8 oz (48.4 kg)      ASSESSMENT AND PLAN:  # Exertional dyspnea: Risk factors for CAD include  hypertension and hyperlipidemia.  ETT was negative for ischemia but her exertional ability was much less than expected.  We will get a coronary CT-A for a more definitive assessment of her coronaries.  We will also refer her to pulmonary.    # Myeloproliferative disorder: # Prior mesenteric thrombus: On aspirin and Xarelto. She never failure anticoagulation and will discuss the need to continue aspirin with Dr. Sanjuana Letters.  She may be on this for secondary preventiof of thrombotic disease but does not need it for CV risk reduction in addition to the Xarelto.  # Hyperlipidemia: Continue simvastatin.  # Hypertension: BP well-controlled.  Continue losartan and HCTZ-triamterene.      Current medicines are reviewed at length with the patient today.  The patient does not have concerns regarding medicines.  The following changes have been made:  no change  Labs/ tests ordered today include:   Orders Placed This Encounter  Procedures  . CT CORONARY MORPH W/CTA COR W/SCORE W/CA W/CM &/OR WO/CM  . CT CORONARY FRACTIONAL FLOW RESERVE DATA PREP  . CT CORONARY FRACTIONAL FLOW RESERVE FLUID ANALYSIS  . Basic metabolic panel  . Ambulatory referral to Pulmonology     Disposition:   FU with Aahil Fredin C. Oval Linsey, MD, [facility] in 1 month.    Signed, Lynnelle Mesmer C. Oval Linsey, MD, [facility]  01/12/2018 3:46 PM    [place]

## 2018-01-12 NOTE — H&P (View-Only) (Signed)
Cardiology Office Note   Date:  01/12/2018   ID:  Gwendolyn Williams, DOB 1940/04/23, MRN [id number]  PCP:  Gaynelle Arabian, MD  Cardiologist:   Skeet Latch, MD   Chief Complaint  Patient presents with  . Follow-up     History of Present Illness: NADA Gwendolyn Williams is a 77 y.o. 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  She was initially seen 10/4 for an evaluation of shortness of breath.  [name] saw Dr. Hughie Closs on 8/26 and reported exertional dyspnea when lifting weights or walking up hills.  She had a chest x-ray that showed some changes consistent with asthma.  She was referred for an echo 10/2017 that revealed LVEF 55 to 60% with grade 1 diastolic dysfunction.  She was referred to cardiology for further evaluation.  She has been on Arimadex for the last 10 years and initially thought her shortness of breath was attributable to this medication. However, in the last 5-6 months she has been increasingly short of breath.  She notices this when walking up stairs or when doing chores around the house.  She is very active and goes to [employer] 5 days per week.  She was referred for an ETT 01/07/18 that was negative for ischemia.  She achieved 4.3 METS.  She is frustrated because she continues to have exertional dyspnea with minimal activity.  She has no lower extremity edema, orthopnea or PND.  She never misses her anticoagulant.  She also hasn't had any recent long car rides or flights.    Past Medical History:  Diagnosis Date  . Arthritis   . Asthma    controlled with singulair  . Breast cancer ([place])    stage I left breast  . Current use of long term anticoagulation 04/21/2017  . Dyspnea, unspecified 02/24/2017   Started coincident with Hydrea, exertional and orthostatic, normal exam. O2 sat 100%.  . Family history of  breast cancer   . GERD (gastroesophageal reflux disease)   . Hematoma of arm 08/09/2012  . 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 Hyperlipidemia   . Hypertension   . 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 Pneumonia    viral pneumonia as a kid  . PONV (postoperative nausea and vomiting)     Past Surgical History:  Procedure Laterality Date  . BREAST SURGERY     Left breast lumpectomy sentinel node biopsy  . CERVICAL SPINE SURGERY  05/23/2008  . COLECTOMY  04/11/2008   left colectomy with end colostomy due to clot  . COLOSTOMY TAKEDOWN  08/14/2008  . EYE SURGERY     bil cataracts  . HERNIA REPAIR  2011   [facility]  . [place] SURGERY  2006  . TOTAL HIP ARTHROPLASTY Left 10/07/2016   Procedure: LEFT TOTAL HIP ARTHROPLASTY ANTERIOR APPROACH;  Surgeon: Paralee Cancel, MD;  Location: [facility];  Service: Orthopedics;  Laterality: Left;  70 mins     Current Outpatient Medications  Medication Sig Dispense Refill  . anastrozole (ARIMIDEX) 1 MG tablet Take 1 mg by mouth daily.     Marland Kitchen aspirin 81 MG tablet Take 81 mg by mouth daily.      . Cholecalciferol (VITAMIN D) 2000 units CAPS Take 2,000 Units by mouth daily.    Marland Kitchen  docusate sodium (COLACE) 100 MG capsule Take 1 capsule (100 mg total) by mouth 2 (two) times daily. 10 capsule 0  . Esomeprazole Magnesium (NEXIUM PO) Take 40 mg by mouth daily.     . folic acid (FOLVITE) 1 MG tablet Take 1 mg by mouth daily.    . hydroxyurea (HYDREA) 500 MG capsule Take 1 capsule (500 mg total) by mouth daily. May take with food to minimize GI side effects. 90 capsule 3  . losartan (COZAAR) 100 MG tablet Take 100 mg by mouth daily.      . montelukast (SINGULAIR) 10 MG tablet Take 1 tablet by mouth at bedtime.     . ondansetron (ZOFRAN) 4 MG tablet Take 1 tablet (4 mg total) by mouth every 6 (six) hours as needed for nausea. 30 tablet 0  . polyethylene glycol (MIRALAX / GLYCOLAX)  packet Take 17 g by mouth 2 (two) times daily. 14 each 0  . rivaroxaban (XARELTO) 20 MG TABS tablet Take 1 tablet (20 mg total) by mouth daily with supper. 90 tablet 3  . simvastatin (ZOCOR) 20 MG tablet Take 20 mg by mouth daily at 6 PM.     . triamterene-hydrochlorothiazide (MAXZIDE-25) 37.5-25 MG tablet Take 1 tablet by mouth daily.    . metoprolol tartrate (LOPRESSOR) 50 MG tablet TAKE 1 TABLET BY MOUTH 2 HOURS PRIOR TO CT 1 tablet 0   No current facility-administered medications for this visit.     Allergies:   Penicillins and Sulfa antibiotics    Social History:  The patient  reports that she quit smoking about 54 years ago. She has never used smokeless tobacco. She reports that she drinks alcohol. She reports that she does not use drugs.   Family History:  The patient's family history includes Basal cell carcinoma in her father; Bladder Cancer in her cousin; Breast cancer (age of onset: 52) in her mother; Breast cancer (age of onset: 72) in her maternal aunt and maternal aunt; Breast cancer (age of onset: 60) in her paternal aunt; CAD in her father; Cancer in her other; Dementia in her sister; Heart disease in her father; Lung cancer in her mother; Other (age of onset: 82) in her daughter; Ovarian cysts in her maternal grandmother and sister; Polycystic ovary syndrome in her daughter.    ROS:  Please see the history of present illness.   Otherwise, review of systems are positive for none.   All other systems are reviewed and negative.    PHYSICAL EXAM: VS:  BP 132/81   Pulse 70   Ht 4\' 11"  (1.499 m)   Wt 109 lb (49.4 kg)   BMI 22.02 kg/m  , BMI Body mass index is 22.02 kg/m. GENERAL:  Well appearing HEENT:  Pupils equal round and reactive, fundi not visualized, oral mucosa unremarkable NECK:  No jugular venous distention, waveform within normal limits, carotid upstroke brisk and symmetric, no bruits, no thyromegaly LUNGS:  Clear to auscultation bilaterally HEART:  RRR.  PMI not  displaced or sustained,S1 and S2 within normal limits, no S3, no S4, no clicks, no rubs, II/VI systolic murmur at the LUSB. ABD:  Flat, positive bowel sounds normal in frequency in pitch, no bruits, no rebound, no guarding, no midline pulsatile mass, no hepatomegaly, no splenomegaly EXT:  2 plus pulses throughout, no edema, no cyanosis no clubbing SKIN:  No rashes no nodules NEURO:  Cranial nerves II through XII grossly intact, motor grossly intact throughout PSYCH:  Cognitively intact, oriented to person place  and time   EKG:  EKG is not ordered today. The ekg ordered 01/01/18 demonstrates sinus rhythm.  Rate 66 bpm.    Echo 11/26/2017: Study Conclusions  - Left ventricle: The cavity size was normal. There was mild focal   basal hypertrophy of the septum. Systolic function was normal.   The estimated ejection fraction was in the range of 55% to 60%.   Wall motion was normal; there were no regional wall motion   abnormalities. Doppler parameters are consistent with abnormal   left ventricular relaxation (grade 1 diastolic dysfunction). - Aortic valve: There was mild regurgitation.  ETT 01/07/18:  Blood pressure demonstrated a hypotensive response to exercise.  There was no ST segment deviation noted during stress.  No T wave inversion was noted during stress.  Poor exercise capacity.  Negative, adequate stress test.     Recent Labs: 09/14/2017: ALT 10; BUN 31; Creatinine, Ser 1.19; Potassium 4.5; Sodium 144 12/14/2017: Hemoglobin 12.3; Platelets 309   11/23/2017: Total cholesterol 200, triglycerides 151, HDL 57, LDL 113 Sodium 142, potassium 4.6, BUN 31, creatinine 1.3 AST 15, ALT 12    Lipid Panel    Component Value Date/Time   TRIG 109 04/17/2008 0425      Wt Readings from Last 3 Encounters:  01/12/18 109 lb (49.4 kg)  01/01/18 106 lb (48.1 kg)  08/25/17 106 lb 12.8 oz (48.4 kg)      ASSESSMENT AND PLAN:  # Exertional dyspnea: Risk factors for CAD include  hypertension and hyperlipidemia.  ETT was negative for ischemia but her exertional ability was much less than expected.  We will get a coronary CT-A for a more definitive assessment of her coronaries.  We will also refer her to pulmonary.    # Myeloproliferative disorder: # Prior mesenteric thrombus: On aspirin and Xarelto. She never failure anticoagulation and will discuss the need to continue aspirin with Dr. Sanjuana Letters.  She may be on this for secondary preventiof of thrombotic disease but does not need it for CV risk reduction in addition to the Xarelto.  # Hyperlipidemia: Continue simvastatin.  # Hypertension: BP well-controlled.  Continue losartan and HCTZ-triamterene.      Current medicines are reviewed at length with the patient today.  The patient does not have concerns regarding medicines.  The following changes have been made:  no change  Labs/ tests ordered today include:   Orders Placed This Encounter  Procedures  . CT CORONARY MORPH W/CTA COR W/SCORE W/CA W/CM &/OR WO/CM  . CT CORONARY FRACTIONAL FLOW RESERVE DATA PREP  . CT CORONARY FRACTIONAL FLOW RESERVE FLUID ANALYSIS  . Basic metabolic panel  . Ambulatory referral to Pulmonology     Disposition:   FU with Mckaylin Bastien C. Oval Linsey, MD, [facility] in 1 month.    Signed, Sabir Charters C. Oval Linsey, MD, [facility]  01/12/2018 3:46 PM    [place]

## 2018-01-12 NOTE — Patient Instructions (Addendum)
Medication Instructions:  TAKE METOPROLOL 50 MG 2 HOURS PRIOR TO CT  If you need a refill on your cardiac medications before your next appointment, please call your pharmacy.   Lab work: BMET 1 WEEK PRIOR TO CT If you have labs (blood work) drawn today and your tests are completely normal, you will receive your results only by: Marland Kitchen MyChart Message (if you have MyChart) OR . A paper copy in the mail If you have any lab test that is abnormal or we need to change your treatment, we will call you to review the results.  Testing/Procedures: Your physician has requested that you have cardiac CT. Cardiac computed tomography (CT) is a painless test that uses an x-ray machine to take clear, detailed pictures of your heart. For further information please visit [email or web address].tn. Please follow instruction sheet as given.  Follow-Up: At [facility], you and your health needs are our priority.  As part of our continuing mission to provide you with exceptional heart care, we have created designated Provider Care Teams.  These Care Teams include your primary Cardiologist (physician) and Advanced Practice Providers (APPs -  Physician Assistants and Nurse Practitioners) who all work together to provide you with the care you need, when you need it. You will need a follow up appointment in 4-6 weeks. You may see DR [facility] or one of the following Advanced Practice Providers on your designated Care Team:   Kerin Ransom, PA-C Roby Lofts, [place] . Sande Rives, PA-C  You have been referred to [facility] PULMONARY  IF YOU DO NOT HEAR FROM THE OFFICE BY Thursday YOU MAY CALL THEM DIRECTLY AT [phone]  Any Other Special Instructions Will Be Listed Below (If Applicable).  Please arrive at the Ambulatory Care Center main entrance of [facility] at xx:xx AM (30-45 minutes prior to test start time)  [facility] [address] [place], [place] [address] [phone]  Proceed to the [facility]  Radiology Department (First Floor).  Please follow these instructions carefully (unless otherwise directed):  On the Night Before the Test: . Be sure to Drink plenty of water. . Do not consume any caffeinated/decaffeinated beverages or chocolate 12 hours prior to your test. . Do not take any antihistamines 12 hours prior to your test. . If you take Metformin do not take 24 hours prior to test. . If the patient has contrast allergy: ? Patient will need a prescription for Prednisone and very clear instructions (as follows): 1. Prednisone 50 mg - take 13 hours prior to test 2. Take another Prednisone 50 mg 7 hours prior to test 3. Take another Prednisone 50 mg 1 hour prior to test 4. Take Benadryl 50 mg 1 hour prior to test . Patient must complete all four doses of above prophylactic medications. . Patient will need a ride after test due to Benadryl.  On the Day of the Test: . Drink plenty of water. Do not drink any water within one hour of the test. . Do not eat any food 4 hours prior to the test. . You may take your regular medications prior to the test.  . Take metoprolol (Lopressor) two hours prior to test. . HOLD Hydrochlorothiazide morning of the test.    Do not give Lopressor to patients with an allergy to lopressor or anyone with asthma or active COPD symptoms (currently taking steroids).       After the Test: . Drink plenty of water. . After receiving IV contrast, you may experience a  mild flushed feeling. This is normal. . On occasion, you may experience a mild rash up to 24 hours after the test. This is not dangerous. If this occurs, you can take Benadryl 25 mg and increase your fluid intake. . If you experience trouble breathing, this can be serious. If it is severe call 911 IMMEDIATELY. If it is mild, please call our office. . If you take any of these medications: Glipizide/Metformin, Avandament, Glucavance, please do not take 48 hours after completing test.   Cardiac CT  Angiogram A cardiac CT angiogram is a procedure to look at the heart and the area around the heart. It may be done to help find the cause of chest pains or other symptoms of heart disease. During this procedure, a large X-ray machine, called a CT scanner, takes detailed pictures of the heart and the surrounding area after a dye (contrast material) has been injected into blood vessels in the area. The procedure is also sometimes called a coronary CT angiogram, coronary artery scanning, or CTA. A cardiac CT angiogram allows the health care provider to see how well blood is flowing to and from the heart. The health care provider will be able to see if there are any problems, such as:  Blockage or narrowing of the coronary arteries in the heart.  Fluid around the heart.  Signs of weakness or disease in the muscles, valves, and tissues of the heart.  Tell a health care provider about:  Any allergies you have. This is especially important if you have had a previous allergic reaction to contrast dye.  All medicines you are taking, including vitamins, herbs, eye drops, creams, and over-the-counter medicines.  Any blood disorders you have.  Any surgeries you have had.  Any medical conditions you have.  Whether you are pregnant or may be pregnant.  Any anxiety disorders, chronic pain, or other conditions you have that may increase your stress or prevent you from lying still. What are the risks? Generally, this is a safe procedure. However, problems may occur, including:  Bleeding.  Infection.  Allergic reactions to medicines or dyes.  Damage to other structures or organs.  Kidney damage from the dye or contrast that is used.  Increased risk of cancer from radiation exposure. This risk is low. Talk with your health care provider about: ? The risks and benefits of testing. ? How you can receive the lowest dose of radiation.  What happens before the procedure?  Wear comfortable  clothing and remove any jewelry, glasses, dentures, and hearing aids.  Follow instructions from your health care provider about eating and drinking. This may include: ? For 12 hours before the test - avoid caffeine. This includes tea, coffee, soda, energy drinks, and diet pills. Drink plenty of water or other fluids that do not have caffeine in them. Being well-hydrated can prevent complications. ? For 4-6 hours before the test - stop eating and drinking. The contrast dye can cause nausea, but this is less likely if your stomach is empty.  Ask your health care provider about changing or stopping your regular medicines. This is especially important if you are taking diabetes medicines, blood thinners, or medicines to treat erectile dysfunction. What happens during the procedure?  Hair on your chest may need to be removed so that small sticky patches called electrodes can be placed on your chest. These will transmit information that helps to monitor your heart during the test.  An IV tube will be inserted into one of  your veins.  You might be given a medicine to control your heart rate during the test. This will help to ensure that good images are obtained.  You will be asked to lie on an exam table. This table will slide in and out of the CT machine during the procedure.  Contrast dye will be injected into the IV tube. You might feel warm, or you may get a metallic taste in your mouth.  You will be given a medicine (nitroglycerin) to relax (dilate) the arteries in your heart.  The table that you are lying on will move into the CT machine tunnel for the scan.  The person running the machine will give you instructions while the scans are being done. You may be asked to: ? Keep your arms above your head. ? Hold your breath. ? Stay very still, even if the table is moving.  When the scanning is complete, you will be moved out of the machine.  The IV tube will be removed. The procedure may  vary among health care providers and hospitals. What happens after the procedure?  You might feel warm, or you may get a metallic taste in your mouth from the contrast dye.  You may have a headache from the nitroglycerin.  After the procedure, drink water or other fluids to wash (flush) the contrast material out of your body.  Contact a health care provider if you have any symptoms of allergy to the contrast. These symptoms include: ? Shortness of breath. ? Rash or hives. ? A racing heartbeat.  Most people can return to their normal activities right after the procedure. Ask your health care provider what activities are safe for you.  It is up to you to get the results of your procedure. Ask your health care provider, or the department that is doing the procedure, when your results will be ready. Summary  A cardiac CT angiogram is a procedure to look at the heart and the area around the heart. It may be done to help find the cause of chest pains or other symptoms of heart disease.  During this procedure, a large X-ray machine, called a CT scanner, takes detailed pictures of the heart and the surrounding area after a dye (contrast material) has been injected into blood vessels in the area.  Ask your health care provider about changing or stopping your regular medicines before the procedure. This is especially important if you are taking diabetes medicines, blood thinners, or medicines to treat erectile dysfunction.  After the procedure, drink water or other fluids to wash (flush) the contrast material out of your body. This information is not intended to replace advice given to you by your health care provider. Make sure you discuss any questions you have with your health care provider. Document Released: 02/28/2008 Document Revised: 02/04/2016 Document Reviewed: 02/04/2016 Elsevier Interactive Patient Education  2017 [employer].

## 2018-01-12 NOTE — Telephone Encounter (Signed)
Patient seen in office today. 

## 2018-01-12 NOTE — Telephone Encounter (Signed)
Received message from Gwendolyn Williams CMA from another office one of the Cardiac CT orders released in error. Cardiac CT orders placed again EPIC

## 2018-01-13 ENCOUNTER — Telehealth: Payer: Self-pay | Admitting: *Deleted

## 2018-01-13 LAB — CBC WITH DIFFERENTIAL/PLATELET
Basophils Absolute: 0.1 10*3/uL (ref 0.0–0.2)
Basos: 1 %
EOS (ABSOLUTE): 0.1 10*3/uL (ref 0.0–0.4)
Eos: 2 %
Hematocrit: 36.7 % (ref 34.0–46.6)
Hemoglobin: 12.2 g/dL (ref 11.1–15.9)
Immature Grans (Abs): 0 10*3/uL (ref 0.0–0.1)
Immature Granulocytes: 1 %
Lymphocytes Absolute: 1.1 10*3/uL (ref 0.7–3.1)
Lymphs: 18 %
MCH: 35.3 pg — ABNORMAL HIGH (ref 26.6–33.0)
MCHC: 33.2 g/dL (ref 31.5–35.7)
MCV: 106 fL — ABNORMAL HIGH (ref 79–97)
Monocytes Absolute: 0.3 10*3/uL (ref 0.1–0.9)
Monocytes: 6 %
Neutrophils Absolute: 4.4 10*3/uL (ref 1.4–7.0)
Neutrophils: 72 %
Platelets: 316 10*3/uL (ref 150–450)
RBC: 3.46 x10E6/uL — ABNORMAL LOW (ref 3.77–5.28)
RDW: 15.5 % — ABNORMAL HIGH (ref 12.3–15.4)
WBC: 6 10*3/uL (ref 3.4–10.8)

## 2018-01-13 NOTE — Telephone Encounter (Signed)
-----   Message from Annia Belt, MD sent at 01/13/2018  9:25 AM EDT ----- Call pt: counts good; platelets 326,000; stay on same dose Hydrea

## 2018-01-13 NOTE — Telephone Encounter (Signed)
Called pt -not at home. Talked to Mr Docter - stated they had viewed results this morning. Informed "counts good; platelets 316,000; stay on same dose Hydrea " per Dr Beryle Beams. Stated he will definitely let her know and will see [place] next week.

## 2018-01-15 DIAGNOSIS — Z01812 Encounter for preprocedural laboratory examination: Secondary | ICD-10-CM | POA: Diagnosis not present

## 2018-01-15 DIAGNOSIS — I119 Hypertensive heart disease without heart failure: Secondary | ICD-10-CM | POA: Diagnosis not present

## 2018-01-15 LAB — BASIC METABOLIC PANEL
BUN/Creatinine Ratio: 23 (ref 12–28)
BUN: 29 mg/dL — ABNORMAL HIGH (ref 8–27)
CO2: 24 mmol/L (ref 20–29)
Calcium: 9.8 mg/dL (ref 8.7–10.3)
Chloride: 102 mmol/L (ref 96–106)
Creatinine, Ser: 1.24 mg/dL — ABNORMAL HIGH (ref 0.57–1.00)
GFR calc Af Amer: 49 mL/min/{1.73_m2} — ABNORMAL LOW (ref >59)
GFR calc non Af Amer: 43 mL/min/{1.73_m2} — ABNORMAL LOW (ref >59)
Glucose: 67 mg/dL (ref 65–99)
Potassium: 4 mmol/L (ref 3.5–5.2)
Sodium: 142 mmol/L (ref 134–144)

## 2018-01-18 ENCOUNTER — Ambulatory Visit (HOSPITAL_COMMUNITY)
Admission: RE | Admit: 2018-01-18 | Discharge: 2018-01-18 | Disposition: A | Discharge status: Home / Self Care | Payer: Medicare Other | Source: Ambulatory Visit | Attending: Cardiovascular Disease | Admitting: Cardiovascular Disease

## 2018-01-18 DIAGNOSIS — R9439 Abnormal result of other cardiovascular function study: Secondary | ICD-10-CM

## 2018-01-18 DIAGNOSIS — I119 Hypertensive heart disease without heart failure: Secondary | ICD-10-CM | POA: Diagnosis not present

## 2018-01-18 DIAGNOSIS — I2510 Atherosclerotic heart disease of native coronary artery without angina pectoris: Secondary | ICD-10-CM | POA: Diagnosis not present

## 2018-01-18 DIAGNOSIS — R0602 Shortness of breath: Secondary | ICD-10-CM | POA: Diagnosis not present

## 2018-01-18 DIAGNOSIS — I700 Atherosclerosis of aorta: Secondary | ICD-10-CM | POA: Diagnosis not present

## 2018-01-18 MED ORDER — IOPAMIDOL (ISOVUE-370) INJECTION 76%
INTRAVENOUS | Status: AC
  Administered 2018-01-18: 80 mL via INTRAVENOUS
  Filled 2018-01-18: qty 100

## 2018-01-18 MED ORDER — IOPAMIDOL (ISOVUE-370) INJECTION 76%: 100.0000 mL | Freq: Once | INTRAVENOUS | Status: DC | PRN

## 2018-01-18 MED ORDER — NITROGLYCERIN 0.4 MG SL SUBL
0.8000 mg | SUBLINGUAL_TABLET | Freq: Once | SUBLINGUAL | Status: AC
  Administered 2018-01-18: 0.8 mg via SUBLINGUAL
  Filled 2018-01-18: qty 25

## 2018-01-18 MED ORDER — NITROGLYCERIN 0.4 MG SL SUBL
SUBLINGUAL_TABLET | SUBLINGUAL | Status: AC
  Filled 2018-01-18: qty 2

## 2018-01-19 ENCOUNTER — Other Ambulatory Visit: Payer: Self-pay

## 2018-01-19 ENCOUNTER — Ambulatory Visit (INDEPENDENT_AMBULATORY_CARE_PROVIDER_SITE_OTHER): Payer: Medicare Other | Admitting: Oncology

## 2018-01-19 ENCOUNTER — Ambulatory Visit (HOSPITAL_COMMUNITY)
Admission: RE | Admit: 2018-01-19 | Discharge: 2018-01-19 | Disposition: A | Discharge status: Home / Self Care | Payer: Medicare Other | Source: Ambulatory Visit | Attending: Oncology | Admitting: Oncology

## 2018-01-19 ENCOUNTER — Encounter: Payer: Self-pay | Admitting: Oncology

## 2018-01-19 VITALS — BP 105/77 | HR 64 | Temp 98.2°F | Ht 61.0 in | Wt 106.1 lb

## 2018-01-19 DIAGNOSIS — Z9221 Personal history of antineoplastic chemotherapy: Secondary | ICD-10-CM

## 2018-01-19 DIAGNOSIS — I351 Nonrheumatic aortic (valve) insufficiency: Secondary | ICD-10-CM

## 2018-01-19 DIAGNOSIS — Z880 Allergy status to penicillin: Secondary | ICD-10-CM

## 2018-01-19 DIAGNOSIS — I7090 Unspecified atherosclerosis: Secondary | ICD-10-CM | POA: Diagnosis not present

## 2018-01-19 DIAGNOSIS — Z7982 Long term (current) use of aspirin: Secondary | ICD-10-CM | POA: Diagnosis not present

## 2018-01-19 DIAGNOSIS — Z923 Personal history of irradiation: Secondary | ICD-10-CM

## 2018-01-19 DIAGNOSIS — R0600 Dyspnea, unspecified: Secondary | ICD-10-CM

## 2018-01-19 DIAGNOSIS — Z79899 Other long term (current) drug therapy: Secondary | ICD-10-CM

## 2018-01-19 DIAGNOSIS — Z853 Personal history of malignant neoplasm of breast: Secondary | ICD-10-CM

## 2018-01-19 DIAGNOSIS — R0609 Other forms of dyspnea: Secondary | ICD-10-CM | POA: Insufficient documentation

## 2018-01-19 DIAGNOSIS — Z7901 Long term (current) use of anticoagulants: Secondary | ICD-10-CM

## 2018-01-19 DIAGNOSIS — Z87891 Personal history of nicotine dependence: Secondary | ICD-10-CM

## 2018-01-19 DIAGNOSIS — D693 Immune thrombocytopenic purpura: Secondary | ICD-10-CM | POA: Diagnosis not present

## 2018-01-19 DIAGNOSIS — D471 Chronic myeloproliferative disease: Secondary | ICD-10-CM

## 2018-01-19 DIAGNOSIS — Z882 Allergy status to sulfonamides status: Secondary | ICD-10-CM | POA: Diagnosis not present

## 2018-01-19 LAB — PULMONARY FUNCTION TEST
DL/VA % pred: 104 %
DL/VA: 4.25 ml/min/mmHg/L
DLCO cor % pred: 94 %
DLCO cor: 16.49 ml/min/mmHg
DLCO unc % pred: 90 %
DLCO unc: 15.84 ml/min/mmHg
FEF 25-75 Post: 5.39 L/sec
FEF 25-75 Pre: 4.53 L/sec
FEF2575-%Change-Post: 18 %
FEF2575-%Pred-Post: 389 %
FEF2575-%Pred-Pre: 327 %
FEV1-%Change-Post: 9 %
FEV1-%Pred-Post: 151 %
FEV1-%Pred-Pre: 138 %
FEV1-Post: 2.5 L
FEV1-Pre: 2.29 L
FEV1FVC-%Change-Post: -6 %
FEV1FVC-%Pred-Pre: 133 %
FEV6-%Change-Post: 16 %
FEV6-%Pred-Post: 126 %
FEV6-%Pred-Pre: 108 %
FEV6-Post: 2.67 L
FEV6-Pre: 2.29 L
FEV6FVC-%Pred-Post: 105 %
FEV6FVC-%Pred-Pre: 105 %
FVC-%Change-Post: 16 %
FVC-%Pred-Post: 120 %
FVC-%Pred-Pre: 103 %
FVC-Post: 2.67 L
FVC-Pre: 2.29 L
Post FEV1/FVC ratio: 94 %
Post FEV6/FVC ratio: 100 %
Pre FEV1/FVC ratio: 100 %
Pre FEV6/FVC Ratio: 100 %
RV % pred: 128 %
RV: 2.62 L
TLC % pred: 117 %
TLC: 5.04 L

## 2018-01-19 MED ORDER — ALBUTEROL SULFATE (2.5 MG/3ML) 0.083% IN NEBU
2.5000 mg | INHALATION_SOLUTION | Freq: Once | RESPIRATORY_TRACT | Status: AC
  Administered 2018-01-19: 2.5 mg via RESPIRATORY_TRACT

## 2018-01-19 MED ORDER — ANAGRELIDE HCL 0.5 MG PO CAPS: 0.5000 mg | ORAL_CAPSULE | Freq: Two times a day (BID) | ORAL | 3 refills | Status: DC

## 2018-01-19 NOTE — Progress Notes (Signed)
Hematology and Oncology Follow Up Visit  Gwendolyn Williams [id number] [birth year] 77 y.o. 01/19/2018 3:15 PM   Principle Diagnosis: Encounter Diagnoses  Name Primary?  Marland Kitchen Dyspnea on minimal exertion Yes  . 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 She has been on full dose Coumadin anticoagulation and low-dose aspirin. She was recently changed to [place] in July 2017. She made last-minute plans to travel.She was not going to be able to get Coumadin levels checked. I decided to transition her to the Xarelto based on evolving experience with the new oral anticoagulants with respect to decreased bleeding in patients on long-term anticoagulation compared with Coumadin and increasing experience with unusual site thrombotic events. Due to progressive thrombocytosis, she was started on low-dose Hydrea 500 mg daily in November 2018.  She had a very nice response with excellent control of her platelet count without fall in her hemoglobin or white count.  She was diagnosed with a stage I, ER/PR positive, HER-2-negative, cancer of the left breast in August 2010. Oncotype DX score high risk. She underwent left lumpectomy on 12/26/2008 followed by adjuvant chemotherapy with 4 cycles of Taxotere plus Cytoxan between 02/16/2009 and 04/20/2009. She then had a course of radiation between February 14 and 07/02/2009. She was started on hormonal therapy with Arimidex which she continues at this time. BRCA1 and BRCA2 gene testing was negative.  She developed a large chest wall hematoma at time of the surgery related to tight anticoagulation which has long since resolved. She has one son. One daughter age 71(2016).  Interim History:    Extended 45-minute visit for this 77 year old woman who has developed progressive dyspnea with minimal exertion.  She initially noted the indolent onset of breathing problems coincident with starting Hydrea in November 2018 to control essential thrombocythemia.  Symptoms initially minimal but over the last month or so have progressed rapidly.  She does not get dyspneic walking slowly but anything that requires minimal effort even something as simple as bending down or folding the laundry provokes dyspnea.  She was referred by her primary care physician to cardiology.  Chest x-ray on August 26 which I personally reviewed is overall normal with some suggestion of changes from reactive airway disease with mild hyperinflation.  She had a echocardiogram November 26, 2017 which showed normal systolic function estimated 42-70%, grade 1 diastolic dysfunction, mild aortic regurgitation.Right ventricular size and function normal.  She had a CT coronary study yesterday.  Results show calcific atherosclerosis in multiple vessels.  Not clear that these findings correlate with her respiratory symptoms.  A pulmonology consultation has been scheduled. Her husband is a retired Software engineer.  They read the package insert on Hydrea.  Although rare, some cases of pulmonary fibrosis/interstitial pneumonitis have been reported.  She stopped the drug yesterday.  Only other new complaint is tenderness over the lateral ribs on the right side and a localized area.  No external palpable mass or skin change.   Medications: reviewed  Allergies:  Allergies  Allergen Reactions  . Penicillins Hives    Has patient had a PCN reaction causing immediate rash, facial/tongue/throat swelling, SOB or lightheadedness with hypotension: Unknown Has patient had a PCN reaction causing severe rash involving mucus membranes or skin necrosis: Unknown Has patient had a PCN reaction that required hospitalization: No Has patient had a PCN reaction  occurring within the last 10 years: No If all of the above answers are "NO", then may proceed with Cephalosporin use.   . Sulfa Antibiotics Other (See Comments)    unknown    Review of Systems: See interim history Remaining ROS negative:   Physical Exam: Blood pressure 105/77, pulse 64, temperature 98.2 F (36.8 C), temperature source Oral, height '5\' 1"'$  (1.549 m), weight 106 lb 1.6 oz (48.1 kg), SpO2 100 %. Wt Readings from Last 3 Encounters:  01/19/18 106 lb 1.6 oz (48.1 kg)  01/12/18 109 lb (49.4 kg)  01/01/18 106 lb (48.1 kg)     General appearance: Thin Caucasian woman who is quite anxious over her progressive pulmonary symptoms [name]: Pharynx no erythema, exudate, mass, or ulcer. No thyromegaly or thyroid nodules Lymph nodes: No cervical, supraclavicular, or axillary lymphadenopathy Breasts: Not examined today Lungs: Clear to auscultation, resonant to percussion throughout.  Mild, fine, and inspiratory rales at the right lung base. Heart: Regular rhythm, no murmur, no gallop, no rub, no click, no edema Abdomen: Soft, nontender, normal bowel sounds, no mass, no organomegaly Extremities: No edema, no calf tenderness Musculoskeletal: no joint deformities.  There is point tenderness approximately T9 right lateral rib. GU:  Vascular: Carotid pulses 2+, no bruits,  Neurologic: Alert, oriented, PERRLA, , cranial nerves grossly normal, motor strength 5 over 5, reflexes 1+ symmetric, upper body coordination normal, gait normal, Skin: No rash or ecchymosis  Lab Results: CBC W/Diff    Component Value Date/Time   WBC 6.0 01/12/2018 1358   WBC 7.8 02/24/2017 1007   RBC 3.46 (L) 01/12/2018 1358   RBC 5.38 (H) 02/24/2017 1007   HGB 12.2 01/12/2018 1358   HGB 11.8 12/08/2014 1540   HCT 36.7 01/12/2018 1358   HCT 36.8 12/08/2014 1540   PLT 316 01/12/2018 1358   MCV 106 (H) 01/12/2018 1358   MCV 72.0 (L) 12/08/2014 1540   MCH 35.3 (H) 01/12/2018 1358   MCH 24.2 (L) 02/24/2017  1007   MCHC 33.2 01/12/2018 1358   MCHC 30.7 02/24/2017 1007   RDW 15.5 (H) 01/12/2018 1358   RDW 17.1 (H) 12/08/2014 1540   LYMPHSABS 1.1 01/12/2018 1358   LYMPHSABS 1.4 12/08/2014 1540   MONOABS 0.2 02/24/2017 1007   MONOABS 0.6 12/08/2014 1540   EOSABS 0.1 01/12/2018 1358   BASOSABS 0.1 01/12/2018 1358   BASOSABS 0.1 12/08/2014 1540     Chemistry      Component Value Date/Time   NA 142 01/15/2018 1042   NA 137 12/08/2014 1540   K 4.0 01/15/2018 1042   K 4.2 12/08/2014 1540   CL 102 01/15/2018 1042   CL 105 08/03/2012 1529   CO2 24 01/15/2018 1042   CO2 25 12/08/2014 1540   BUN 29 (H) 01/15/2018 1042   BUN 27.4 (H) 12/08/2014 1540   CREATININE 1.24 (H) 01/15/2018 1042   CREATININE 1.1 12/08/2014 1540      Component Value Date/Time   CALCIUM 9.8 01/15/2018 1042   CALCIUM 9.9 12/08/2014 1540   ALKPHOS 40 09/14/2017 0922   ALKPHOS 42 12/08/2014 1540   AST 14 09/14/2017 0922   AST 23 12/08/2014 1540   ALT 10 09/14/2017 0922   ALT 21 12/08/2014 1540   BILITOT 0.4 09/14/2017 0922   BILITOT 0.65 12/08/2014 1540       Radiological Studies: Ct Coronary Morph W/cta Cor W/score W/ca W/cm &/or Wo/cm  Addendum Date: 01/18/2018   ADDENDUM REPORT: 01/18/2018 18:00 CLINICAL DATA:  Chest pain EXAM: Cardiac  CTA MEDICATIONS: Sub lingual nitro. '4mg'$  x 2 TECHNIQUE: The patient was scanned on a Siemens 299 slice scanner. Gantry rotation speed was 250 msecs. Collimation was 0.6 mm. A 100 kV prospective scan was triggered in the ascending thoracic aorta at 35-75% of the R-R interval. Average HR during the scan was 60 bpm. The 3D data set was interpreted on a dedicated work station using MPR, MIP and VRT modes. A total of 80cc of contrast was used. FINDINGS: Non-cardiac: See separate report from [facility] Radiology. Pulmonary veins drain normally to the left atrium. Calcium Score: 667 Agatston units. Coronary Arteries: Right dominant with no anomalies LM: No significant disease. LAD system:  Extensive mixed plaque proximal LAD. Possible moderate (50-75%) stenosis. Circumflex system: Soft plaque mid LCx, appears to be about 50% stenosis. RCA: Mixed plaque mild stenosis in the mid RCA. IMPRESSION: 1. Coronary artery calcium score 667 Agatston units. This places the patient in the 90th percentile for age and gender, suggesting high risk for future cardiac events. 2. Suspect 50% mid LCx stenosis and moderate (50-75%) proximal LAD stenosis. Will send for CT FFR. Dalton Mclean Electronically Signed   By: Loralie Champagne M.D.   On: 01/18/2018 18:00   Result Date: 01/18/2018 EXAM: OVER-READ INTERPRETATION  CT CHEST The following report is an over-read performed by radiologist Dr. Vinnie Langton of [facility], [place] on 01/18/2018. This over-read does not include interpretation of cardiac or coronary anatomy or pathology. The coronary calcium score/coronary CTA interpretation by the cardiologist is attached. COMPARISON:  None. FINDINGS: Aortic atherosclerosis. Within the visualized portions of the thorax there are no suspicious appearing pulmonary nodules or masses, there is no acute consolidative airspace disease, no pleural effusions, no pneumothorax and no lymphadenopathy. Visualized portions of the upper abdomen are unremarkable. There are no aggressive appearing lytic or blastic lesions noted in the visualized portions of the skeleton. IMPRESSION: 1.  Aortic Atherosclerosis (ICD10-I70.0). Electronically Signed: By: Vinnie Langton M.D. On: 01/18/2018 08:50    Impression: 1.  Progressive dyspnea with mild exertion. This does not appear to be cardiac in origin. Potential toxicity from Hydrea. I told her to stay off the Hydrea.  I am going to prescribe anagrelide 0.5 mg twice daily to begin in 1 week.  It takes about 2 weeks for the Hydrea to get out of the system.  I am waiting 1 week to have an overlap so she does not get a rebound thrombocytosis. I have scheduled pulmonary functions with a  DLCO today so she will have this information available when she sees the lung specialist.  Further evaluation per pulmonologist.  She will likely need a high-resolution CT scan of the chest.  2.  Myeloproliferative disorder: Essential thrombocythemia Initially presenting with mesenteric vascular thrombosis requiring emergency surgery. Hydrea started December 2018 in view of progressive rise in her platelet count. We will now hold the drug as indicated in #1 above due to potential pulmonary toxicity.  This is quite rare if it is from the [facility].  3.  Chronic anticoagulation in view of #2 above.  Currently on Xarelto.  4.  Stage I ER PR positive HER-2 negative, Oncotype DX high risk, invasive left breast cancer treated as outlined above.  No gross evidence for recurrence now out over 9 years from diagnosis in August 2010. We focused on her new pulmonary complaints today.  If her right rib pain persists, further evaluation will be done.  A CT scan of the chest done as part of the CT coronary  angiography yesterday did not mention any obvious bone or pulmonary lesions.   CC: Patient Care Team: Gaynelle Arabian, MD as PCP - General (Family Medicine) Annia Belt, MD as Consulting Physician (Oncology) Rolm Bookbinder, MD as Consulting Physician (General Surgery)   Murriel Hopper, MD, [place]  Hematology-Oncology/Internal Medicine     10/22/20193:15 PM

## 2018-01-19 NOTE — Patient Instructions (Signed)
Pulmonary function tests today at 2 PM: go to admitting office at 1:45 & they will direct you. Check CBC every 2 weeks x 3 MD visit 4-6 weeks

## 2018-01-20 ENCOUNTER — Telehealth: Payer: Self-pay | Admitting: Cardiovascular Disease

## 2018-01-20 DIAGNOSIS — R0602 Shortness of breath: Secondary | ICD-10-CM

## 2018-01-20 DIAGNOSIS — Z01812 Encounter for preprocedural laboratory examination: Secondary | ICD-10-CM

## 2018-01-20 DIAGNOSIS — I119 Hypertensive heart disease without heart failure: Secondary | ICD-10-CM | POA: Diagnosis not present

## 2018-01-20 NOTE — Telephone Encounter (Signed)
New Message   Patient is calling to obtain information about the recent CT she had.

## 2018-01-20 NOTE — Telephone Encounter (Signed)
Return call to pt. Pt states she is awaiting results for Cardiac CTA. Advised that Dr. Oval Linsey has to review report, make her interpretation, and will have her nurse contact her with results. Pt verbalized understanding.

## 2018-01-21 ENCOUNTER — Encounter: Payer: Self-pay | Admitting: *Deleted

## 2018-01-21 ENCOUNTER — Other Ambulatory Visit: Payer: Self-pay | Admitting: Cardiovascular Disease

## 2018-01-21 DIAGNOSIS — R9439 Abnormal result of other cardiovascular function study: Secondary | ICD-10-CM

## 2018-01-21 NOTE — Telephone Encounter (Signed)
  Patient's son was calling for her to state that they would like to get the results of her test and discuss how she has been since her appt.

## 2018-01-21 NOTE — Telephone Encounter (Addendum)
Dr Oval Linsey spoke with patient regarding Cardiac CT and recommends cardiac cath. Patient scheduled for 01/25/18 with Dr End. She will go to [facility] for labs, letter at front for pick up. Patient aware of date, time, and location.

## 2018-01-22 ENCOUNTER — Telehealth: Payer: Self-pay

## 2018-01-22 ENCOUNTER — Encounter: Payer: Self-pay | Admitting: Gynecology

## 2018-01-22 ENCOUNTER — Other Ambulatory Visit: Payer: Medicare Other | Admitting: *Deleted

## 2018-01-22 DIAGNOSIS — Z853 Personal history of malignant neoplasm of breast: Secondary | ICD-10-CM | POA: Diagnosis not present

## 2018-01-22 DIAGNOSIS — R0602 Shortness of breath: Secondary | ICD-10-CM

## 2018-01-22 DIAGNOSIS — Z96642 Presence of left artificial hip joint: Secondary | ICD-10-CM | POA: Diagnosis not present

## 2018-01-22 DIAGNOSIS — Z01812 Encounter for preprocedural laboratory examination: Secondary | ICD-10-CM

## 2018-01-22 DIAGNOSIS — M810 Age-related osteoporosis without current pathological fracture: Secondary | ICD-10-CM | POA: Diagnosis not present

## 2018-01-22 DIAGNOSIS — Z1231 Encounter for screening mammogram for malignant neoplasm of breast: Secondary | ICD-10-CM | POA: Diagnosis not present

## 2018-01-22 LAB — CBC WITH DIFFERENTIAL/PLATELET
Basophils Absolute: 0 10*3/uL (ref 0.0–0.2)
Basos: 0 %
EOS (ABSOLUTE): 0.1 10*3/uL (ref 0.0–0.4)
Eos: 1 %
Hematocrit: 36.7 % (ref 34.0–46.6)
Hemoglobin: 12.5 g/dL (ref 11.1–15.9)
Lymphocytes Absolute: 1.1 10*3/uL (ref 0.7–3.1)
Lymphs: 18 %
MCH: 35 pg — ABNORMAL HIGH (ref 26.6–33.0)
MCHC: 34.1 g/dL (ref 31.5–35.7)
MCV: 103 fL — ABNORMAL HIGH (ref 79–97)
Monocytes Absolute: 0.2 10*3/uL (ref 0.1–0.9)
Monocytes: 3 %
Neutrophils Absolute: 4.8 10*3/uL (ref 1.4–7.0)
Neutrophils: 78 %
Platelets: 355 10*3/uL (ref 150–450)
RBC: 3.57 x10E6/uL — ABNORMAL LOW (ref 3.77–5.28)
RDW: 13.5 % (ref 12.3–15.4)
WBC: 6.2 10*3/uL (ref 3.4–10.8)

## 2018-01-22 LAB — BASIC METABOLIC PANEL
BUN/Creatinine Ratio: 26 (ref 12–28)
BUN: 32 mg/dL — ABNORMAL HIGH (ref 8–27)
CO2: 26 mmol/L (ref 20–29)
Calcium: 9.3 mg/dL (ref 8.7–10.3)
Chloride: 106 mmol/L (ref 96–106)
Creatinine, Ser: 1.24 mg/dL — ABNORMAL HIGH (ref 0.57–1.00)
GFR calc Af Amer: 49 mL/min/{1.73_m2} — ABNORMAL LOW (ref >59)
GFR calc non Af Amer: 43 mL/min/{1.73_m2} — ABNORMAL LOW (ref >59)
Glucose: 57 mg/dL — ABNORMAL LOW (ref 65–99)
Potassium: 3.8 mmol/L (ref 3.5–5.2)
Sodium: 140 mmol/L (ref 134–144)

## 2018-01-22 NOTE — Telephone Encounter (Signed)
Spoke to patient GFR 43.Cardiac cath moved up to 12:00 noon on Monday 10/28 due to needing hydration.Spoke to [employer] in cath lab she will alert short stay patient will arrive 5 hours early 7:00 am for hydration.

## 2018-01-25 ENCOUNTER — Ambulatory Visit (HOSPITAL_COMMUNITY)
Admission: RE | Admit: 2018-01-25 | Discharge: 2018-01-25 | Disposition: A | Discharge status: Home / Self Care | Payer: Medicare Other | Source: Ambulatory Visit | Attending: Internal Medicine | Admitting: Internal Medicine

## 2018-01-25 ENCOUNTER — Encounter: Payer: Self-pay | Admitting: Gynecology

## 2018-01-25 ENCOUNTER — Other Ambulatory Visit: Payer: Self-pay

## 2018-01-25 ENCOUNTER — Institutional Professional Consult (permissible substitution): Payer: Medicare Other | Admitting: Pulmonary Disease

## 2018-01-25 ENCOUNTER — Encounter (HOSPITAL_COMMUNITY)
Admission: RE | Disposition: A | Discharge status: Home / Self Care | Payer: Self-pay | Source: Ambulatory Visit | Attending: Internal Medicine

## 2018-01-25 DIAGNOSIS — R9439 Abnormal result of other cardiovascular function study: Secondary | ICD-10-CM

## 2018-01-25 DIAGNOSIS — K219 Gastro-esophageal reflux disease without esophagitis: Secondary | ICD-10-CM | POA: Insufficient documentation

## 2018-01-25 DIAGNOSIS — R931 Abnormal findings on diagnostic imaging of heart and coronary circulation: Secondary | ICD-10-CM | POA: Insufficient documentation

## 2018-01-25 DIAGNOSIS — Z803 Family history of malignant neoplasm of breast: Secondary | ICD-10-CM | POA: Insufficient documentation

## 2018-01-25 DIAGNOSIS — I200 Unstable angina: Secondary | ICD-10-CM | POA: Diagnosis present

## 2018-01-25 DIAGNOSIS — Z8052 Family history of malignant neoplasm of bladder: Secondary | ICD-10-CM | POA: Insufficient documentation

## 2018-01-25 DIAGNOSIS — M810 Age-related osteoporosis without current pathological fracture: Secondary | ICD-10-CM | POA: Diagnosis not present

## 2018-01-25 DIAGNOSIS — Z853 Personal history of malignant neoplasm of breast: Secondary | ICD-10-CM | POA: Diagnosis not present

## 2018-01-25 DIAGNOSIS — Z79899 Other long term (current) drug therapy: Secondary | ICD-10-CM | POA: Diagnosis not present

## 2018-01-25 DIAGNOSIS — Z880 Allergy status to penicillin: Secondary | ICD-10-CM | POA: Insufficient documentation

## 2018-01-25 DIAGNOSIS — I2584 Coronary atherosclerosis due to calcified coronary lesion: Secondary | ICD-10-CM | POA: Diagnosis not present

## 2018-01-25 DIAGNOSIS — Z7982 Long term (current) use of aspirin: Secondary | ICD-10-CM | POA: Diagnosis not present

## 2018-01-25 DIAGNOSIS — Z9889 Other specified postprocedural states: Secondary | ICD-10-CM | POA: Diagnosis not present

## 2018-01-25 DIAGNOSIS — C946 Myelodysplastic disease, not classified: Secondary | ICD-10-CM | POA: Insufficient documentation

## 2018-01-25 DIAGNOSIS — M1990 Unspecified osteoarthritis, unspecified site: Secondary | ICD-10-CM | POA: Insufficient documentation

## 2018-01-25 DIAGNOSIS — Z882 Allergy status to sulfonamides status: Secondary | ICD-10-CM | POA: Diagnosis not present

## 2018-01-25 DIAGNOSIS — Z8249 Family history of ischemic heart disease and other diseases of the circulatory system: Secondary | ICD-10-CM | POA: Diagnosis not present

## 2018-01-25 DIAGNOSIS — Z87891 Personal history of nicotine dependence: Secondary | ICD-10-CM | POA: Insufficient documentation

## 2018-01-25 DIAGNOSIS — Z9049 Acquired absence of other specified parts of digestive tract: Secondary | ICD-10-CM | POA: Insufficient documentation

## 2018-01-25 DIAGNOSIS — I129 Hypertensive chronic kidney disease with stage 1 through stage 4 chronic kidney disease, or unspecified chronic kidney disease: Secondary | ICD-10-CM | POA: Diagnosis not present

## 2018-01-25 DIAGNOSIS — J45909 Unspecified asthma, uncomplicated: Secondary | ICD-10-CM | POA: Diagnosis not present

## 2018-01-25 DIAGNOSIS — E785 Hyperlipidemia, unspecified: Secondary | ICD-10-CM | POA: Insufficient documentation

## 2018-01-25 DIAGNOSIS — N183 Chronic kidney disease, stage 3 (moderate): Secondary | ICD-10-CM | POA: Insufficient documentation

## 2018-01-25 DIAGNOSIS — Z96642 Presence of left artificial hip joint: Secondary | ICD-10-CM | POA: Insufficient documentation

## 2018-01-25 DIAGNOSIS — Z7901 Long term (current) use of anticoagulants: Secondary | ICD-10-CM | POA: Insufficient documentation

## 2018-01-25 DIAGNOSIS — Z801 Family history of malignant neoplasm of trachea, bronchus and lung: Secondary | ICD-10-CM | POA: Insufficient documentation

## 2018-01-25 DIAGNOSIS — I25110 Atherosclerotic heart disease of native coronary artery with unstable angina pectoris: Secondary | ICD-10-CM

## 2018-01-25 DIAGNOSIS — Z808 Family history of malignant neoplasm of other organs or systems: Secondary | ICD-10-CM | POA: Diagnosis not present

## 2018-01-25 DIAGNOSIS — Z8041 Family history of malignant neoplasm of ovary: Secondary | ICD-10-CM | POA: Insufficient documentation

## 2018-01-25 HISTORY — PX: LEFT HEART CATH AND CORONARY ANGIOGRAPHY: CATH118249

## 2018-01-25 HISTORY — PX: INTRAVASCULAR PRESSURE WIRE/FFR STUDY: CATH118243

## 2018-01-25 LAB — POCT ACTIVATED CLOTTING TIME
Activated Clotting Time: 224 seconds
Activated Clotting Time: 224 seconds
Activated Clotting Time: 208 seconds

## 2018-01-25 SURGERY — LEFT HEART CATH AND CORONARY ANGIOGRAPHY
Anesthesia: LOCAL

## 2018-01-25 MED ORDER — SODIUM CHLORIDE 0.9 % WEIGHT BASED INFUSION: 1.0000 mL/kg/h | INTRAVENOUS | Status: DC

## 2018-01-25 MED ORDER — ADENOSINE 12 MG/4ML IV SOLN
INTRAVENOUS | Status: AC
  Filled 2018-01-25: qty 4

## 2018-01-25 MED ORDER — SODIUM CHLORIDE 0.9% FLUSH: 3.0000 mL | Freq: Two times a day (BID) | INTRAVENOUS | Status: DC

## 2018-01-25 MED ORDER — SODIUM CHLORIDE 0.9 % IV SOLN: 250.0000 mL | INTRAVENOUS | Status: DC | PRN

## 2018-01-25 MED ORDER — IOHEXOL 350 MG/ML SOLN
INTRAVENOUS | Status: DC | PRN
  Administered 2018-01-25: 55 mL via INTRA_ARTERIAL

## 2018-01-25 MED ORDER — ONDANSETRON HCL 4 MG/2ML IJ SOLN: 4.0000 mg | Freq: Four times a day (QID) | INTRAMUSCULAR | Status: DC | PRN

## 2018-01-25 MED ORDER — SODIUM CHLORIDE 0.9 % WEIGHT BASED INFUSION
3.0000 mL/kg/h | INTRAVENOUS | Status: AC
  Administered 2018-01-25: 3 mL/kg/h via INTRAVENOUS

## 2018-01-25 MED ORDER — HEPARIN SODIUM (PORCINE) 1000 UNIT/ML IJ SOLN
INTRAMUSCULAR | Status: AC
  Filled 2018-01-25: qty 1

## 2018-01-25 MED ORDER — FENTANYL CITRATE (PF) 100 MCG/2ML IJ SOLN
INTRAMUSCULAR | Status: AC
  Filled 2018-01-25: qty 2

## 2018-01-25 MED ORDER — HEPARIN (PORCINE) IN NACL 1000-0.9 UT/500ML-% IV SOLN
INTRAVENOUS | Status: AC
  Filled 2018-01-25: qty 500

## 2018-01-25 MED ORDER — ASPIRIN 81 MG PO CHEW: 81.0000 mg | CHEWABLE_TABLET | ORAL | Status: DC

## 2018-01-25 MED ORDER — ADENOSINE (DIAGNOSTIC) 140MCG/KG/MIN
INTRAVENOUS | Status: AC | PRN
  Administered 2018-01-25: 140 ug/kg/min via INTRAVENOUS

## 2018-01-25 MED ORDER — SODIUM CHLORIDE 0.9% FLUSH: 3.0000 mL | INTRAVENOUS | Status: DC | PRN

## 2018-01-25 MED ORDER — ACETAMINOPHEN 325 MG PO TABS
650.0000 mg | ORAL_TABLET | ORAL | Status: DC | PRN
  Administered 2018-01-25: 650 mg via ORAL
  Filled 2018-01-25: qty 2

## 2018-01-25 MED ORDER — NITROGLYCERIN 1 MG/10 ML FOR IR/CATH LAB
INTRA_ARTERIAL | Status: DC | PRN
  Administered 2018-01-25: 200 ug via INTRACORONARY

## 2018-01-25 MED ORDER — FENTANYL CITRATE (PF) 100 MCG/2ML IJ SOLN
INTRAMUSCULAR | Status: DC | PRN
  Administered 2018-01-25 (×2): 25 ug via INTRAVENOUS

## 2018-01-25 MED ORDER — VERAPAMIL HCL 2.5 MG/ML IV SOLN
INTRAVENOUS | Status: DC | PRN
  Administered 2018-01-25: 10 mL via INTRA_ARTERIAL

## 2018-01-25 MED ORDER — LIDOCAINE HCL (PF) 1 % IJ SOLN
INTRAMUSCULAR | Status: DC | PRN
  Administered 2018-01-25: 2 mL via INTRADERMAL

## 2018-01-25 MED ORDER — HEPARIN SODIUM (PORCINE) 1000 UNIT/ML IJ SOLN
INTRAMUSCULAR | Status: DC | PRN
  Administered 2018-01-25 (×2): 2500 [IU] via INTRAVENOUS

## 2018-01-25 MED ORDER — HEPARIN (PORCINE) IN NACL 1000-0.9 UT/500ML-% IV SOLN
INTRAVENOUS | Status: DC | PRN
  Administered 2018-01-25 (×2): 500 mL

## 2018-01-25 MED ORDER — VERAPAMIL HCL 2.5 MG/ML IV SOLN
INTRAVENOUS | Status: AC
  Filled 2018-01-25: qty 2

## 2018-01-25 MED ORDER — MIDAZOLAM HCL 2 MG/2ML IJ SOLN
INTRAMUSCULAR | Status: AC
  Filled 2018-01-25: qty 2

## 2018-01-25 MED ORDER — SODIUM CHLORIDE 0.9 % IV SOLN: INTRAVENOUS | Status: DC

## 2018-01-25 MED ORDER — LIDOCAINE HCL (PF) 1 % IJ SOLN
INTRAMUSCULAR | Status: AC
  Filled 2018-01-25: qty 30

## 2018-01-25 MED ORDER — MIDAZOLAM HCL 2 MG/2ML IJ SOLN
INTRAMUSCULAR | Status: DC | PRN
  Administered 2018-01-25 (×2): 1 mg via INTRAVENOUS

## 2018-01-25 SURGICAL SUPPLY — 16 items
CATH INFINITI 4FR 145 PIGTAIL (CATHETERS) ×2 IMPLANT
CATH INFINITI 5FR MULTPACK ANG (CATHETERS) ×2 IMPLANT
CATH LAUNCHER 5F EBU3.0 (CATHETERS) IMPLANT
CATHETER LAUNCHER 5F EBU3.0 (CATHETERS) ×2
DEVICE RAD TR BAND REGULAR (VASCULAR PRODUCTS) ×2 IMPLANT
GLIDESHEATH SLEND A-KIT 6F 22G (SHEATH) ×2 IMPLANT
GUIDEWIRE INQWIRE 1.5J.035X260 (WIRE) ×1 IMPLANT
GUIDEWIRE PRESSURE COMET II (WIRE) ×1 IMPLANT
INQWIRE 1.5J .035X260CM (WIRE) ×2
KIT ESSENTIALS PG (KITS) ×1 IMPLANT
KIT HEART LEFT (KITS) ×2 IMPLANT
PACK CARDIAC CATHETERIZATION (CUSTOM PROCEDURE TRAY) ×2 IMPLANT
SHEATH PROBE COVER 6X72 (BAG) ×2 IMPLANT
TRANSDUCER W/STOPCOCK (MISCELLANEOUS) ×2 IMPLANT
TUBING CIL FLEX 10 FLL-RA (TUBING) ×2 IMPLANT
WIRE HI TORQ VERSACORE-J 145CM (WIRE) ×2 IMPLANT

## 2018-01-25 NOTE — Interval H&P Note (Signed)
History and Physical Interval Note:  01/25/2018 12:49 PM  Gwendolyn Williams  has presented today for cardiac catheterization, with the diagnosis of shortness of breath and abnormal cardiac CTA. The various methods of treatment have been discussed with the patient and family. After consideration of risks, benefits and other options for treatment, the patient has consented to  Procedure(s): LEFT HEART CATH AND CORONARY ANGIOGRAPHY (N/A) as a surgical intervention .  The patient's history has been reviewed, patient examined, no change in status, stable for surgery.  I have reviewed the patient's chart and labs.  Questions were answered to the patient's satisfaction.    Cath Lab Visit (complete for each Cath Lab visit)  Clinical Evaluation Leading to the Procedure:   ACS: No.  Non-ACS:    Anginal Classification: CCS III  Anti-ischemic medical therapy: No Therapy  Non-Invasive Test Results: Low-risk stress test findings: cardiac mortality <1%/year.  Cardiac CTA with moderate to severe LAD and LCx disease.  Proximal LAD stenosis is hemodynamically significant by CT-FFR.  Prior CABG: No previous CABG  Gwendolyn Williams

## 2018-01-25 NOTE — Discharge Instructions (Signed)
Drink plenty of fluids over next 48 hours and keep right wrist elevated at heart level for 24 hours May resume Xarelto tomorrow 10/29  Radial Site Care Refer to this sheet in the next few weeks. These instructions provide you with information about caring for yourself after your procedure. Your health care provider may also give you more specific instructions. Your treatment has been planned according to current medical practices, but problems sometimes occur. Call your health care provider if you have any problems or questions after your procedure. What can I expect after the procedure? After your procedure, it is typical to have the following:  Bruising at the radial site that usually fades within 1-2 weeks.  Blood collecting in the tissue (hematoma) that may be painful to the touch. It should usually decrease in size and tenderness within 1-2 weeks.  Follow these instructions at home:  Take medicines only as directed by your health care provider.  You may shower 24-48 hours after the procedure or as directed by your health care provider. Remove the bandage (dressing) and gently wash the site with plain soap and water. Pat the area dry with a clean towel. Do not rub the site, because this may cause bleeding.  Do not take baths, swim, or use a hot tub until your health care provider approves.  Check your insertion site every day for redness, swelling, or drainage.  Do not apply powder or lotion to the site.  Do not flex or bend the affected arm for 24 hours or as directed by your health care provider.  Do not push or pull heavy objects with the affected arm for 24 hours or as directed by your health care provider.  Do not lift over 10 lb (4.5 kg) for 5 days after your procedure or as directed by your health care provider.  Ask your health care provider when it is okay to: ? Return to work or school. ? Resume usual physical activities or sports. ? Resume sexual activity.  Do not  drive home if you are discharged the same day as the procedure. Have someone else drive you.  You may drive 24 hours after the procedure unless otherwise instructed by your health care provider.  Do not operate machinery or power tools for 24 hours after the procedure.  If your procedure was done as an outpatient procedure, which means that you went home the same day as your procedure, a responsible adult should be with you for the first 24 hours after you arrive home.  Keep all follow-up visits as directed by your health care provider. This is important. Contact a health care provider if:  You have a fever.  You have chills.  You have increased bleeding from the radial site. Hold pressure on the site. Get help right away if:  You have unusual pain at the radial site.  You have redness, warmth, or swelling at the radial site.  You have drainage (other than a small amount of blood on the dressing) from the radial site.  The radial site is bleeding, and the bleeding does not stop after 30 minutes of holding steady pressure on the site.  Your arm or hand becomes pale, cool, tingly, or numb. This information is not intended to replace advice given to you by your health care provider. Make sure you discuss any questions you have with your health care provider. Document Released: 04/19/2010 Document Revised: 08/23/2015 Document Reviewed: 10/03/2013 Elsevier Interactive Patient Education  2018 [employer].

## 2018-01-25 NOTE — Brief Op Note (Signed)
BRIEF CARDIAC CATHETERIZATION NOTE  DATE: 01/25/2018 TIME: 2:20 PM  PATIENT:  Gwendolyn Williams  76 y.o. female  PRE-OPERATIVE DIAGNOSIS:  Accelerating angina (shortness of breath) and abnormal cardiac CTA  POST-OPERATIVE DIAGNOSIS:  Non-obstructive coronary artery disease  PROCEDURE:  Procedure(s): LEFT HEART CATH AND CORONARY ANGIOGRAPHY (N/A)  SURGEON:  Surgeon(s) and Role:    * Simcha Farrington, MD - Primary  FINDINGS: 1. Mild to moderate, non-obstructive CAD.  FFR of proximal and mid LAD is not hemodynamically significant. 2. Normal LVEDP.  RECOMMENDATIONS: 1. Medical therapy and risk factor modification to prevent progression of disease. 2. Consider trial of long-acting nitrate or ranolazine in case there is an element of microvascular dysfunction. 3. If no evidence of bleeding or vascular injury, restart rivaroxaban tomorrow.  Nelva Bush, MD [facility] Pager: [phone]

## 2018-01-26 ENCOUNTER — Telehealth: Payer: Self-pay | Admitting: Cardiovascular Disease

## 2018-01-26 ENCOUNTER — Encounter (HOSPITAL_COMMUNITY): Payer: Self-pay | Admitting: Internal Medicine

## 2018-01-26 ENCOUNTER — Telehealth: Payer: Self-pay | Admitting: *Deleted

## 2018-01-26 NOTE — Telephone Encounter (Signed)
New Message   Patients spouse is calling on her behalf. She recently had a heart cath and was told that she may need a long acting nitro. They are interested in seeing about getting that prescription. They also want to know whether or not she needs to be see sooner. Please call to discuss.

## 2018-01-26 NOTE — Telephone Encounter (Signed)
INFORMED  HUSBAND , WILL DEFER TO DR Dacono , CONTACT HIM OR PATIENT WITH COMMENT.   NEXT APPOINTMENT IS SCHEDULE FOR   IS DEC 5 ,2019 WITH DR    HUSBAND VOICE UNDERSTANDING.

## 2018-01-26 NOTE — Telephone Encounter (Signed)
-----   Message from Annia Belt, MD sent at 01/26/2018 10:01 AM EDT ----- Call pt: cath results noted. I want her to stop the Hydrea sine we have not identified another reason for her dyspnea. Go on the anagrelide as we discussed in the office. If she is not comfortable with this - can wait until next Tuesday when I get back to discuss

## 2018-01-26 NOTE — Telephone Encounter (Signed)
Called / talked to pt's husband - informed "cath results noted. I want her to stop the Hydrea since we have not identified another reason for her dyspnea. Go on the anagrelide as we discussed in the office. If she is not comfortable with this - can wait until next Tuesday when I get back to discuss " per Dr Beryle Beams. Mr Mabry stated they are still talking with the cardiologist; and prefer to wait until Dr Synthia Innocent return next Tuesday.

## 2018-01-26 NOTE — Telephone Encounter (Addendum)
Call from pt's husband - stated she had a cardiac cath done. And wants to know when or not to re-start Hydrea? He's aware Dr Darnell Level is out of town. Thanks

## 2018-01-26 NOTE — Telephone Encounter (Signed)
Reviewed chart.  There is no clear answer on Hydrea, but from my review she should be off until told to restart by Dr. Darnell Level and after evaluation by Pulmonary and Cardiology.  She should have started back her Xarelto the morning after left heart cath.  If Dr. Darnell Level now has a different opinion, he can respond tomorrow when he returns.  Thank you!

## 2018-01-27 NOTE — Telephone Encounter (Signed)
Returned call to husband-advised Dr. Oval Linsey OOO (in hospital rounding) and would call with recommendations asap.   He states she cannot take Ranexa due to this being contraindicated with her Xarelto.     Advised would send message to Dr. Oval Linsey and will return call once recommendations are received.

## 2018-01-27 NOTE — Telephone Encounter (Signed)
  Patient's husband has called to follow up from previous call and My Chart message. It was recommended that Gwendolyn Williams be put on long acting nitrate after her catherization on 01/25/18. Patient would like to get started on this medication right away but needs the prescription called in.

## 2018-01-28 MED ORDER — ISOSORBIDE MONONITRATE ER 30 MG PO TB24: 30.0000 mg | ORAL_TABLET | Freq: Every day | ORAL | 3 refills | Status: DC

## 2018-01-28 NOTE — Telephone Encounter (Signed)
Imdur 30 mg daily 

## 2018-01-28 NOTE — Telephone Encounter (Signed)
Advised husband, verbalized understanding

## 2018-02-01 ENCOUNTER — Telehealth: Payer: Self-pay | Admitting: Cardiovascular Disease

## 2018-02-01 NOTE — Telephone Encounter (Signed)
Pt c/o Shortness Of Breath: STAT if SOB developed within the last 24 hours or pt is noticeably SOB on the phone  1. Are you currently SOB (can you hear that pt is SOB on the phone)? no  2. How long have you been experiencing SOB? Going on for months   3. Are you SOB when sitting or when up moving around? At all times, not breathing normally per her husband   4. Are you currently experiencing any other symptoms? No, just the SOB.  She did come in to see Dr. Oval Linsey on 01/12/18 was given isosorbide mononitrate (IMDUR) 30 MG 24 hr tablet, it's not helping her. She does have a follow up on 12/5. Husband wants to know should be seen sooner or is there something else Dr. Oval Linsey would like to do.

## 2018-02-01 NOTE — Telephone Encounter (Signed)
Returned call to husband (ok per DPR)-he states patient stated isosorbide 10/31.  He states her SOB has continued and would like to see Dr. Oval Linsey sooner.   Her SOB is unchanged, it is no better or no worse.   She does have a HA from the Imdur.   She was also referred to pulmonology but this had to be rescheduled due to the cath.  She is scheduled to see pulmonology 11/13.  appt scheduled for Thursday 11/7 at 940 AM with Dr. Oval Linsey.   Husband aware and verbalized understanding

## 2018-02-02 ENCOUNTER — Encounter: Payer: Self-pay | Admitting: Oncology

## 2018-02-02 ENCOUNTER — Other Ambulatory Visit (INDEPENDENT_AMBULATORY_CARE_PROVIDER_SITE_OTHER): Payer: Medicare Other

## 2018-02-02 DIAGNOSIS — R0609 Other forms of dyspnea: Secondary | ICD-10-CM | POA: Diagnosis not present

## 2018-02-02 DIAGNOSIS — D471 Chronic myeloproliferative disease: Secondary | ICD-10-CM | POA: Diagnosis not present

## 2018-02-02 DIAGNOSIS — R0600 Dyspnea, unspecified: Secondary | ICD-10-CM

## 2018-02-02 NOTE — Progress Notes (Signed)
Phone call to patient.  I spoke with her husband. Ongoing investigation into unexplained progressive dyspnea with mild exertion.  Normal pulmonary function tests.  Subcritical coronary disease on cardiac cath with normal left ventricular function by cardiac cath and by echocardiogram.  No valvular disease.  I stopped her Hydrea on general principle given some case reports of interstitial pneumonitis but I would have expected if this was the etiology we would have seen a decrease in her DLCO. My previous experience with Megace is that this progesterone derivative can cause this unexplained dyspnea.  I looked up anastrozole, the current aromatase inhibitor that she has been taking for the last 7 years, and in fact dyspnea is recorded in 6 to 10% of patients.  This is not an insignificant number.  I am advising her to stop the Arimidex at this time.  She will keep her scheduled appointments with pulmonology consultant and follow-up with cardiology.

## 2018-02-02 NOTE — Telephone Encounter (Signed)
Opened in error

## 2018-02-03 LAB — CBC WITH DIFFERENTIAL/PLATELET
Basophils Absolute: 0.1 10*3/uL (ref 0.0–0.2)
Basos: 2 %
EOS (ABSOLUTE): 0.1 10*3/uL (ref 0.0–0.4)
Eos: 2 %
Hematocrit: 35.3 % (ref 34.0–46.6)
Hemoglobin: 11.8 g/dL (ref 11.1–15.9)
Immature Grans (Abs): 0.1 10*3/uL (ref 0.0–0.1)
Immature Granulocytes: 1 %
Lymphocytes Absolute: 1.1 10*3/uL (ref 0.7–3.1)
Lymphs: 16 %
MCH: 33.4 pg — ABNORMAL HIGH (ref 26.6–33.0)
MCHC: 33.4 g/dL (ref 31.5–35.7)
MCV: 100 fL — ABNORMAL HIGH (ref 79–97)
Monocytes Absolute: 0.5 10*3/uL (ref 0.1–0.9)
Monocytes: 6 %
Neutrophils Absolute: 5.3 10*3/uL (ref 1.4–7.0)
Neutrophils: 73 %
Platelets: 352 10*3/uL (ref 150–450)
RBC: 3.53 x10E6/uL — ABNORMAL LOW (ref 3.77–5.28)
RDW: 13.3 % (ref 12.3–15.4)
WBC: 7.2 10*3/uL (ref 3.4–10.8)

## 2018-02-04 ENCOUNTER — Telehealth: Payer: Self-pay | Admitting: *Deleted

## 2018-02-04 ENCOUNTER — Ambulatory Visit (INDEPENDENT_AMBULATORY_CARE_PROVIDER_SITE_OTHER): Payer: Medicare Other | Admitting: Cardiovascular Disease

## 2018-02-04 ENCOUNTER — Encounter: Payer: Self-pay | Admitting: Cardiovascular Disease

## 2018-02-04 VITALS — BP 128/82 | HR 86 | Ht 60.0 in | Wt 107.6 lb

## 2018-02-04 DIAGNOSIS — R0609 Other forms of dyspnea: Secondary | ICD-10-CM

## 2018-02-04 DIAGNOSIS — Z5181 Encounter for therapeutic drug level monitoring: Secondary | ICD-10-CM | POA: Diagnosis not present

## 2018-02-04 DIAGNOSIS — I119 Hypertensive heart disease without heart failure: Secondary | ICD-10-CM

## 2018-02-04 DIAGNOSIS — I2510 Atherosclerotic heart disease of native coronary artery without angina pectoris: Secondary | ICD-10-CM | POA: Diagnosis not present

## 2018-02-04 DIAGNOSIS — R0600 Dyspnea, unspecified: Secondary | ICD-10-CM

## 2018-02-04 DIAGNOSIS — I10 Essential (primary) hypertension: Secondary | ICD-10-CM

## 2018-02-04 DIAGNOSIS — E7800 Pure hypercholesterolemia, unspecified: Secondary | ICD-10-CM | POA: Diagnosis not present

## 2018-02-04 MED ORDER — ATORVASTATIN CALCIUM 20 MG PO TABS: 20.0000 mg | ORAL_TABLET | Freq: Every day | ORAL | 3 refills | Status: DC

## 2018-02-04 NOTE — Telephone Encounter (Signed)
-----   Message from Annia Belt, MD sent at 02/03/2018 12:44 PM EST ----- Call pt: platelet count holding at 352,000; can wait for pulmonary consultation but given normal lung function tests - it would be OK to go back on the Hydrea but stay off the arimidex.

## 2018-02-04 NOTE — Telephone Encounter (Signed)
Called pt - no answer; left message to call me back. 

## 2018-02-04 NOTE — Patient Instructions (Signed)
Medication Instructions:  STOP ISOSORBIDE  STOP SIMVASTATIN   START ATORVASTATIN 20 MG DAILY   If you need a refill on your cardiac medications before your next appointment, please call your pharmacy.   Lab work: FASTING LP/CMET IN 6-8 WEEKS   If you have labs (blood work) drawn today and your tests are completely normal, you will receive your results only by: Marland Kitchen MyChart Message (if you have MyChart) OR . A paper copy in the mail If you have any lab test that is abnormal or we need to change your treatment, we will call you to review the results.  Testing/Procedures: NONE  Follow-Up: At [facility], you and your health needs are our priority.  As part of our continuing mission to provide you with exceptional heart care, we have created designated Provider Care Teams.  These Care Teams include your primary Cardiologist (physician) and Advanced Practice Providers (APPs -  Physician Assistants and Nurse Practitioners) who all work together to provide you with the care you need, when you need it. You will need a follow up appointment in 6 months.  Please call our office 2 months in advance to schedule this appointment.  You may see DR [facility]  or one of the following Advanced Practice Providers on your designated Care Team:   Kerin Ransom, PA-C Roby Lofts, [place] . Sande Rives, PA-C

## 2018-02-04 NOTE — Progress Notes (Signed)
Cardiology Office Note   Date:  02/04/2018   ID:  Gwendolyn Williams, DOB 11/09/1940, MRN [id number]  PCP:  Gaynelle Arabian, MD  Cardiologist:   Skeet Latch, MD   No chief complaint on file.    History of Present Illness: Gwendolyn Williams is a 77 y.o. 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  She was initially seen 10/4 for an evaluation of shortness of breath.  Ms. Wass saw Dr. Hughie Closs on 8/26 and reported exertional dyspnea when lifting weights or walking up hills.  She had a chest x-ray that showed some changes consistent with asthma.  She was referred for an echo 10/2017 that revealed LVEF 55 to 60% with grade 1 diastolic dysfunction.  She was referred to cardiology for further evaluation.  She has been on Arimadex for the last 10 years and initially thought her shortness of breath was attributable to this medication. However, in the last 5-6 months she has been increasingly short of breath.  She notices this when walking up stairs or when doing chores around the house.  She is very active and goes to [employer] 5 days per week.  She was referred for an ETT 01/07/18 that was negative for ischemia.  She achieved 4.3 METS.  She had a coronary CT that was concerning for proximal LAD stenosis.  FFR of that region was 0.64.  She underwent left heart catheterization 01/17/2018 that revealed 30% proximal and 50% mid LAD stenoses.  She also had 50% OM 2.  FFR of the LAD was nonsignificant.  It was felt that CAD was not causing her symptoms.  She was started on Imdur 30 mg.  Since that time she has spoken with Dr. Beryle Beams who is concerned that anastrozole could be causing her dyspnea and recommended that she stop taking it.  She has also been referred to pulmonology.  Since the heart cath Ms. Din continues to have some  dyspnea.  She thinks it may be minimally improved since stopping anastrazole.  She has no chest pain or pressure.  She also denies any lower extremity edema, orthopnea, or PND.  She is disappointed that her functional capacity continues to be diminished.  She does not think the nitroglycerin has helped.  Past Medical History:  Diagnosis Date  . Arthritis   . Asthma    controlled with singulair  . Breast cancer ([place])    stage I left breast  . Current use of long term anticoagulation 04/21/2017  . Dyspnea, unspecified 02/24/2017   Started coincident with Hydrea, exertional and orthostatic, normal exam. O2 sat 100%.  . Family history of breast cancer   . GERD (gastroesophageal reflux disease)   . Hematoma of arm 08/09/2012  . 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 Hyperlipidemia   . Hypertension   . 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 Pneumonia    viral pneumonia as a kid  . PONV (postoperative nausea and vomiting)     Past Surgical History:  Procedure Laterality Date  . BREAST SURGERY     Left breast lumpectomy sentinel node biopsy  . CERVICAL SPINE SURGERY  05/23/2008  . COLECTOMY  04/11/2008   left colectomy with end colostomy due to clot  . COLOSTOMY TAKEDOWN  08/14/2008  . EYE SURGERY  bil cataracts  . HERNIA REPAIR  2011   [facility]  . INTRAVASCULAR PRESSURE WIRE/FFR STUDY N/A 01/25/2018   Procedure: INTRAVASCULAR PRESSURE WIRE/FFR STUDY;  Surgeon: Nelva Bush, MD;  Location: [facility];  Service: Cardiovascular;  Laterality: N/A;  . LEFT HEART CATH AND CORONARY ANGIOGRAPHY N/A 01/25/2018   Procedure: LEFT HEART CATH AND CORONARY ANGIOGRAPHY;  Surgeon: Nelva Bush, MD;  Location: [facility];  Service: Cardiovascular;  Laterality: N/A;  . LUMBAR [place] SURGERY  2006  . TOTAL HIP ARTHROPLASTY Left 10/07/2016   Procedure: LEFT TOTAL HIP ARTHROPLASTY ANTERIOR APPROACH;   Surgeon: Paralee Cancel, MD;  Location: [facility];  Service: Orthopedics;  Laterality: Left;  70 mins     Current Outpatient Medications  Medication Sig Dispense Refill  . aspirin 81 MG tablet Take 81 mg by mouth daily.      . Cholecalciferol (VITAMIN D) 2000 units CAPS Take 2,000 Units by mouth daily.    Marland Kitchen docusate sodium (COLACE) 100 MG capsule Take 1 capsule (100 mg total) by mouth 2 (two) times daily. (Patient taking differently: Take 100 mg by mouth daily. ) 10 capsule 0  . esomeprazole (NEXIUM) 40 MG capsule Take 40 mg by mouth daily.     . folic acid (FOLVITE) 1 MG tablet Take 1 mg by mouth daily.    Marland Kitchen losartan (COZAAR) 100 MG tablet Take 100 mg by mouth daily.      . montelukast (SINGULAIR) 10 MG tablet Take 1 tablet by mouth at bedtime.     . ondansetron (ZOFRAN) 4 MG tablet Take 1 tablet (4 mg total) by mouth every 6 (six) hours as needed for nausea. 30 tablet 0  . polyethylene glycol (MIRALAX / GLYCOLAX) packet Take 17 g by mouth 2 (two) times daily. (Patient taking differently: Take 17 g by mouth daily. ) 14 each 0  . rivaroxaban (XARELTO) 20 MG TABS tablet Take 1 tablet (20 mg total) by mouth daily with supper. 90 tablet 3  . triamterene-hydrochlorothiazide (MAXZIDE-25) 37.5-25 MG tablet Take 1 tablet by mouth daily.    Marland Kitchen atorvastatin (LIPITOR) 20 MG tablet Take 1 tablet (20 mg total) by mouth daily. 90 tablet 3   No current facility-administered medications for this visit.     Allergies:   Penicillins and Sulfa antibiotics    Social History:  The patient  reports that she quit smoking about 54 years ago. She has never used smokeless tobacco. She reports that she drinks alcohol. She reports that she does not use drugs.   Family History:  The patient's family history includes Basal cell carcinoma in her father; Bladder Cancer in her cousin; Breast cancer (age of onset: 64) in her mother; Breast cancer (age of onset: 84) in her maternal aunt and maternal aunt; Breast cancer (age of  onset: 106) in her paternal aunt; CAD in her father; Cancer in her other; Dementia in her sister; Heart disease in her father; Lung cancer in her mother; Other (age of onset: 7) in her daughter; Ovarian cysts in her maternal grandmother and sister; Polycystic ovary syndrome in her daughter.    ROS:  Please see the history of present illness.   Otherwise, review of systems are positive for none.   All other systems are reviewed and negative.    PHYSICAL EXAM: VS:  BP 128/82   Pulse 86   Ht 5' (1.524 m)   Wt 107 lb 9.6 oz (48.8 kg)   SpO2 98%   BMI 21.01 kg/m  ,  BMI Body mass index is 21.01 kg/m. GENERAL:  Well appearing HEENT: Pupils equal round and reactive, fundi not visualized, oral mucosa unremarkable NECK:  No jugular venous distention, waveform within normal limits, carotid upstroke brisk and symmetric, no bruits LUNGS:  Clear to auscultation bilaterally HEART:  RRR.  PMI not displaced or sustained,S1 and S2 within normal limits, no S3, no S4, no clicks, no rubs, no murmurs ABD:  Flat, positive bowel sounds normal in frequency in pitch, no bruits, no rebound, no guarding, no midline pulsatile mass, no hepatomegaly, no splenomegaly EXT:  2 plus pulses throughout, no edema, no cyanosis no clubbing SKIN:  No rashes no nodules NEURO:  Cranial nerves II through XII grossly intact, motor grossly intact throughout PSYCH:  Cognitively intact, oriented to person place and time I have no edema on Cerner she did she think there she should be read so sorry  EKG:  EKG is not ordered today. The ekg ordered 01/01/18 demonstrates sinus rhythm.  Rate 66 bpm.    Echo 11/26/2017: Study Conclusions  - Left ventricle: The cavity size was normal. There was mild focal   basal hypertrophy of the septum. Systolic function was normal.   The estimated ejection fraction was in the range of 55% to 60%.   Wall motion was normal; there were no regional wall motion   abnormalities. Doppler parameters are  consistent with abnormal   left ventricular relaxation (grade 1 diastolic dysfunction). - Aortic valve: There was mild regurgitation.  ETT 01/07/18:  Blood pressure demonstrated a hypotensive response to exercise.  There was no ST segment deviation noted during stress.  No T wave inversion was noted during stress.  Poor exercise capacity.  Negative, adequate stress test.   Coronary CT-A 01/18/18: IMPRESSION: 1. Coronary artery calcium score 667 Agatston units. This places the patient in the 90th percentile for age and gender, suggesting high risk for future cardiac events.  2. Suspect 50% mid LCx stenosis and moderate (50-75%) proximal LAD stenosis. Will send for CT FFR.  FINDINGS: FFR 0.64 mid LAD  FFR 0.94 mid RCA  FFR 0.9 mid LCx  IMPRESSION: Hemodynamically significant proximal LAD stenosis.   Recent Labs: 09/14/2017: ALT 10 01/22/2018: BUN 32; Creatinine, Ser 1.24; Potassium 3.8; Sodium 140 02/02/2018: Hemoglobin 11.8; Platelets 352   11/23/2017: Total cholesterol 200, triglycerides 151, HDL 57, LDL 113 Sodium 142, potassium 4.6, BUN 31, creatinine 1.3 AST 15, ALT 12    Lipid Panel    Component Value Date/Time   TRIG 109 04/17/2008 0425      Wt Readings from Last 3 Encounters:  02/04/18 107 lb 9.6 oz (48.8 kg)  01/25/18 102 lb (46.3 kg)  01/19/18 106 lb 1.6 oz (48.1 kg)      ASSESSMENT AND PLAN:  # Exertional dyspnea:  This does not appear to be attributable to her CAD.  She has not had any improvement with isosorbide mononitrate.  I suspect that her CAD is subclinical.  Her systolic function is normal and she only has grade 1 diastolic dysfunction.  She is euvolemic on exam.  This does not seem to be attributable to cardiac disease.  Hopefully this will improve with holding her anastrozole.  # CAD:  # Hyperlipidemia:  Aspirin.  We will switch simvastatin to atorvastatin 20 mg daily.  Check lipids and a CMP in 6 to 8 weeks.   #  Myeloproliferative disorder: # Prior mesenteric thrombus: On aspirin and Xarelto for secondary prevention of thromboembolic disease per Dr. Beryle Beams.   #  Hypertension: BP well-controlled.  Continue losartan and HCTZ-triamterene.      Current medicines are reviewed at length with the patient today.  The patient does not have concerns regarding medicines.  The following changes have been made:  no change  Labs/ tests ordered today include:   Orders Placed This Encounter  Procedures  . Lipid panel  . Comprehensive metabolic panel     Disposition:   FU with Sou Nohr C. Oval Linsey, MD, [facility] in 6 months.    Signed, Amandamarie Feggins C. Oval Linsey, MD, [facility]  02/04/2018 10:25 AM    [facility]

## 2018-02-05 NOTE — Telephone Encounter (Signed)
Noted Thanks DrG 

## 2018-02-05 NOTE — Telephone Encounter (Signed)
Prior encounter- Dr Rosezena Sensor, had already called / talked to pt/husband.

## 2018-02-05 NOTE — Telephone Encounter (Signed)
Return call from pt - stated her husband last talked to Dr Darnell Level was on Tuesday. Informed "platelet count holding at 352,000; can wait for pulmonary consultation but given normal lung function tests - it would be OK to go back on the Hydrea but stay off the arimidex." per Dr Beryle Beams. She repeated instruction to go back taking Hydrea. Stated she has not noticed "much difference" since being off Arimidex.

## 2018-02-10 ENCOUNTER — Encounter: Payer: Self-pay | Admitting: Pulmonary Disease

## 2018-02-10 ENCOUNTER — Ambulatory Visit (INDEPENDENT_AMBULATORY_CARE_PROVIDER_SITE_OTHER): Payer: Medicare Other | Admitting: Pulmonary Disease

## 2018-02-10 VITALS — BP 102/66 | HR 69 | Ht 60.0 in | Wt 107.0 lb

## 2018-02-10 DIAGNOSIS — R0602 Shortness of breath: Secondary | ICD-10-CM | POA: Diagnosis not present

## 2018-02-10 DIAGNOSIS — I2510 Atherosclerotic heart disease of native coronary artery without angina pectoris: Secondary | ICD-10-CM | POA: Diagnosis not present

## 2018-02-10 MED ORDER — FLUTICASONE FUROATE-VILANTEROL 100-25 MCG/INH IN AEPB: 1.0000 | INHALATION_SPRAY | Freq: Every day | RESPIRATORY_TRACT | 0 refills | Status: AC

## 2018-02-10 MED ORDER — ALBUTEROL SULFATE HFA 108 (90 BASE) MCG/ACT IN AERS: 2.0000 | INHALATION_SPRAY | Freq: Four times a day (QID) | RESPIRATORY_TRACT | 2 refills | Status: DC | PRN

## 2018-02-10 NOTE — Patient Instructions (Signed)
Shortness of breath We will order the following tests and treatment as follows: --CT Chest without contrast --Breo Ellipta 100/25 mcg: Take 1 puff daily --Albuterol inhaler: Take 2 puffs every 4 hours as needed for shortness of breath or wheezing  Follow-up in 1 month

## 2018-02-10 NOTE — Progress Notes (Signed)
Synopsis: Referred in 01/2018 for shortness of breath  Subjective:   PATIENT ID: Christella Noa GENDER: female DOB: 1940/11/06, MRN: [id number]   HPI  Chief Complaint  Patient presents with  . CONSULT    Aug 2019 began with SOB but getting worse.  Feels she "can't get a good breath".  Has had multiple testing and cardiology referral.     Ms. 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  For the last 6 months, she reports gradually worsening shortness of breath.  Associated with chronic cough throughout the day and worsens during the early evening.  She feels as if she cannot take a deep breath.  Denies chest pain, wheezing.  Denies nocturnal symptoms.  Has tried albuterol relief.  Talking and walking upstairs aggravate her symptoms.  She was previously active, attending [employer] daily however had to stop completely 6 weeks ago.  She underwent left heart catheterization on 01/17/2018 mistreating nonobstructive CAD. She was also advised to discontinue her Arimidex on 11/6 which she has been on for 10 years but has not felt a difference in symptoms.  Denies allergies.   Minimal smoking history. 6 pack years. Quit >50 years ago.  Environmental exposures: None  I have personally reviewed patient's past medical/family/social history, allergies, current medications.  Past Medical History:  Diagnosis Date  . Arthritis   . Asthma    controlled with singulair  . Breast cancer ([place])    stage I left breast  . Current use of long term anticoagulation 04/21/2017  . Dyspnea, unspecified 02/24/2017   Started coincident with Hydrea, exertional and orthostatic, normal exam. O2 sat 100%.  . Family history of  breast cancer   . GERD (gastroesophageal reflux disease)   . H/O blood clots    clotting disorder since 2010  . Hematoma of arm 08/09/2012  . 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 Hyperlipidemia   . Hypertension   . Ischemic colon ([place])    03/2008  . 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 Pneumonia    viral pneumonia as a kid  . PONV (postoperative nausea and vomiting)      Family History  Problem Relation Age of Onset  . Breast cancer Mother 65  . Lung cancer Mother   . Heart disease Father   . Basal cell carcinoma Father        x2  . CAD Father   . Ovarian cysts Sister   . Dementia Sister   . Breast cancer Maternal Aunt 16  . Breast cancer Paternal Aunt 62  . Ovarian cysts Maternal Grandmother        possibly had ovarian cancer as well  . Breast cancer Maternal Aunt 70  . Cancer Other        either pancreatic or colon  . Other Daughter 70       breast lumpectomy- complex sclerosing lesion  . Polycystic ovary syndrome Daughter   . Bladder Cancer Cousin      Social History   Occupational History  . Not on file  Tobacco Use  . Smoking status: Former Smoker    Packs/day: 0.50    Years: 6.00    Pack years: 3.00    Last attempt  to quit: 01/06/1964    Years since quitting: 54.1  . Smokeless tobacco: Never Used  Substance and Sexual Activity  . Alcohol use: Yes    Alcohol/week: 0.0 standard drinks    Comment: occasionally.  . Drug use: No  . Sexual activity: Not on file    Allergies  Allergen Reactions  . Penicillins Hives    Has patient had a PCN reaction causing immediate rash, facial/tongue/throat swelling, SOB or lightheadedness with hypotension: Unknown Has patient had a PCN reaction causing severe rash involving mucus membranes or skin necrosis: Unknown Has patient had a PCN reaction that required hospitalization: No Has patient had a PCN reaction occurring within the last 10 years: No If all of the  above answers are "NO", then may proceed with Cephalosporin use.   . Sulfa Antibiotics Other (See Comments)    unknown     Outpatient Medications Prior to Visit  Medication Sig Dispense Refill  . aspirin 81 MG tablet Take 81 mg by mouth daily.      Marland Kitchen atorvastatin (LIPITOR) 20 MG tablet Take 1 tablet (20 mg total) by mouth daily. 90 tablet 3  . Cholecalciferol (VITAMIN D) 2000 units CAPS Take 2,000 Units by mouth daily.    Marland Kitchen docusate sodium (COLACE) 100 MG capsule Take 1 capsule (100 mg total) by mouth 2 (two) times daily. (Patient taking differently: Take 100 mg by mouth daily. ) 10 capsule 0  . esomeprazole (NEXIUM) 40 MG capsule Take 40 mg by mouth daily.     . folic acid (FOLVITE) 1 MG tablet Take 1 mg by mouth daily.    . hydroxyurea (HYDREA) 500 MG capsule Take 500 mg by mouth daily. May take with food to minimize GI side effects.    Marland Kitchen losartan (COZAAR) 100 MG tablet Take 100 mg by mouth daily.      . montelukast (SINGULAIR) 10 MG tablet Take 1 tablet by mouth at bedtime.     . ondansetron (ZOFRAN) 4 MG tablet Take 1 tablet (4 mg total) by mouth every 6 (six) hours as needed for nausea. 30 tablet 0  . polyethylene glycol (MIRALAX / GLYCOLAX) packet Take 17 g by mouth 2 (two) times daily. (Patient taking differently: Take 17 g by mouth daily. ) 14 each 0  . rivaroxaban (XARELTO) 20 MG TABS tablet Take 1 tablet (20 mg total) by mouth daily with supper. 90 tablet 3  . triamterene-hydrochlorothiazide (MAXZIDE-25) 37.5-25 MG tablet Take 1 tablet by mouth daily.     No facility-administered medications prior to visit.     Review of Systems  Constitutional: Negative for chills, diaphoresis, fever and malaise/fatigue.  HENT: Negative for congestion.   Eyes: Negative for blurred vision.  Respiratory: Positive for cough, shortness of breath and wheezing. Negative for hemoptysis and sputum production.   Cardiovascular: Negative for chest pain, claudication, leg swelling and PND.    Gastrointestinal: Negative for heartburn.  Genitourinary: Negative for frequency.  Musculoskeletal: Negative for myalgias.  Skin: Negative for rash.  Neurological: Negative for loss of consciousness and weakness.  Endo/Heme/Allergies: Negative for environmental allergies.     Objective:  Physical Exam  Constitutional: She is oriented to person, place, and time. She appears well-developed and well-nourished. No distress.  HENT:  Head: Normocephalic and atraumatic.  Nose: Nose normal.  Eyes: Conjunctivae and EOM are normal. No scleral icterus.  Neck: Normal range of motion. Neck supple. No tracheal deviation present.  Cardiovascular: Normal rate, regular rhythm, normal heart sounds and intact distal pulses.  Exam reveals no gallop and no friction rub.  No murmur heard. Pulmonary/Chest: Effort normal and breath sounds normal. No stridor. No respiratory distress. She has no wheezes. She has no rales.  Abdominal: Soft. Bowel sounds are normal. She exhibits no distension. There is no tenderness.  Musculoskeletal: Normal range of motion. She exhibits no edema or deformity.  Neurological: She is alert and oriented to person, place, and time. No cranial nerve deficit or sensory deficit.  Skin: Skin is warm and dry. No rash noted. She is not diaphoretic. No erythema. No pallor.  Psychiatric: She has a normal mood and affect. Her behavior is normal. Judgment and thought content normal.  Vitals reviewed.    Vitals:   02/10/18 1016  BP: 102/66  Pulse: 69  SpO2: 100%  Weight: 107 lb (48.5 kg)  Height: 5' (1.524 m)   SpO2: 100 % O2 Device: None (Room air)  Chest imaging: CXR 11/23/2017 No acute cardiopulmonary processes.  No infiltrate, edema or effusion.  PFT: 01/19/2018-normal spirometry lung volumes and DLCO.  Significant bronchodilator effect in FVC.  I have personally reviewed the above labs, images and tests noted above.    Assessment & Plan:   Discussion: 78 year old  female who presents for shortness of breath.  Extensive cardiac work-up including LHC which was negative.  PFTs reviewed with patient and demonstrates significant bronchodilator effect.  Benefit from bronchodilator treatment.  Shortness of breath We will order the following tests and treatment as follows: --CT Chest without contrast for further evaluation --Breo Ellipta 100/25 mcg: Take 1 puff daily --Albuterol inhaler: Take 2 puffs every 4 hours as needed for shortness of breath or wheezing  Orders Placed This Encounter  Procedures  . CT Chest Wo Contrast    SS> chantel [phone]/ MCR     Standing Status:   Future    Standing Expiration Date:   04/13/2019    Scheduling Instructions:     Schedule as soon as possible - patient follow up in one month    Order Specific Question:   Preferred imaging location?    Answer:   [place]    Order Specific Question:   Radiology Contrast Protocol - do NOT remove file path    Answer:   [email or web address]   Meds ordered this encounter  Medications  . albuterol (PROVENTIL HFA;VENTOLIN HFA) 108 (90 Base) MCG/ACT inhaler    Sig: Inhale 2 puffs into the lungs every 6 (six) hours as needed for wheezing or shortness of breath.    Dispense:  1 Inhaler    Refill:  2  . fluticasone furoate-vilanterol (BREO ELLIPTA) 100-25 MCG/INH AEPB    Sig: Inhale 1 puff into the lungs daily for 1 day.    Dispense:  1 each    Refill:  0    Order Specific Question:   Lot Number?    Answer:   [id number]    Order Specific Question:   Expiration Date?    Answer:   02/11/2019    Order Specific Question:   Manufacturer?    Answer:   [employer] [[id number]]    Order Specific Question:   Quantity    Answer:   1    Return in about 4 weeks (around 03/10/2018).   Thank you for choosing [facility] for your health needs!   Ozzie Remmers Rodman Pickle, MD [facility] 02/11/2018 3:49 PM  Personal pager: [phone] If unanswered,  please page CCM On-call: [phone]

## 2018-02-11 ENCOUNTER — Encounter: Payer: Self-pay | Admitting: Pulmonary Disease

## 2018-02-12 ENCOUNTER — Ambulatory Visit (INDEPENDENT_AMBULATORY_CARE_PROVIDER_SITE_OTHER)
Admission: RE | Admit: 2018-02-12 | Discharge: 2018-02-12 | Disposition: A | Discharge status: Home / Self Care | Payer: Medicare Other | Source: Ambulatory Visit | Attending: Pulmonary Disease | Admitting: Pulmonary Disease

## 2018-02-12 DIAGNOSIS — R0602 Shortness of breath: Secondary | ICD-10-CM

## 2018-02-15 ENCOUNTER — Telehealth: Payer: Self-pay | Admitting: Pulmonary Disease

## 2018-02-15 ENCOUNTER — Encounter: Payer: Self-pay | Admitting: Oncology

## 2018-02-15 NOTE — Telephone Encounter (Signed)
I have reviewed CT scan. Overall the lung scan is normal with very mild airway widening in the lower part of her lungs. This can be common finding in some patients. I would recommend continuing taking her inhaler that I prescribed (Breo) as directed. Please update patient regarding these results.  Elzie Rings, MD

## 2018-02-15 NOTE — Telephone Encounter (Signed)
Spoke with patient's husband. He wanted to know Dr. Loanne Drilling know that she had her Ct scan on 11/15. I advised him that I would let her know, he verbalized understanding.   Dr. Loanne Drilling, please advise on the CT scan results. Thanks!

## 2018-02-15 NOTE — Telephone Encounter (Signed)
Spoke with patient's husband, he is aware of the results. He stated that the patient has responded well to the [place] 100 and wishes to have this called into [facility]. Advised him that I would call this in for her. He verbalized understanding.   [name] also wanted to know if a round of prednisone would help with the continued SOB. Advised him that I would ask Dr. Loanne Drilling.   Dr. Loanne Drilling, please advise. Thanks!

## 2018-02-16 ENCOUNTER — Other Ambulatory Visit (INDEPENDENT_AMBULATORY_CARE_PROVIDER_SITE_OTHER): Payer: Medicare Other

## 2018-02-16 ENCOUNTER — Telehealth: Payer: Self-pay | Admitting: Pulmonary Disease

## 2018-02-16 DIAGNOSIS — R0600 Dyspnea, unspecified: Secondary | ICD-10-CM

## 2018-02-16 DIAGNOSIS — R0609 Other forms of dyspnea: Secondary | ICD-10-CM

## 2018-02-16 DIAGNOSIS — D471 Chronic myeloproliferative disease: Secondary | ICD-10-CM

## 2018-02-16 LAB — CBC WITH DIFFERENTIAL/PLATELET
Abs Immature Granulocytes: 0 10*3/uL (ref 0.00–0.07)
Basophils Absolute: 0.1 10*3/uL (ref 0.0–0.1)
Basophils Relative: 2 %
Eosinophils Absolute: 0.3 10*3/uL (ref 0.0–0.5)
Eosinophils Relative: 4 %
HCT: 39.5 % (ref 36.0–46.0)
Hemoglobin: 12.6 g/dL (ref 12.0–15.0)
Lymphocytes Relative: 21 %
Lymphs Abs: 1.5 10*3/uL (ref 0.7–4.0)
MCH: 31.8 pg (ref 26.0–34.0)
MCHC: 31.9 g/dL (ref 30.0–36.0)
MCV: 99.7 fL (ref 80.0–100.0)
Monocytes Absolute: 0.3 10*3/uL (ref 0.1–1.0)
Monocytes Relative: 4 %
Neutro Abs: 4.9 10*3/uL (ref 1.7–7.7)
Neutrophils Relative %: 69 %
Platelets: 416 10*3/uL — ABNORMAL HIGH (ref 150–400)
RBC: 3.96 MIL/uL (ref 3.87–5.11)
RDW: 13.6 % (ref 11.5–15.5)
WBC: 7.1 10*3/uL (ref 4.0–10.5)
nRBC: 0 % (ref 0.0–0.2)
nRBC: 0 /100 WBC

## 2018-02-16 NOTE — Telephone Encounter (Signed)
Called and spoke with [name] who states pt is still having problems with her SOB, pt is coughing more vigorously and states pt's cough is "thicker sounding."   Pt is unable to cough up any mucus. Gwendolyn Williams states this has been going on for a long time now and pt was seen by Dr. Loanne Drilling 11/13 and pt also had a CT performed.  Per Gwendolyn Williams, pt's symptoms are just becoming worse and they are wanting to know if a course of prednisone and an abx could be prescribed to see if it would help pt out. Gwendolyn Williams stated pt could just be sitting on the couch and she will not be able to take a deep breath.  Pt denies any fever, any postnasal drainage, and any sinus pressure/pain. Gwendolyn Williams stated that pt is having a mild wheeze going on.  [name], please advise on this for pt. Thanks!

## 2018-02-16 NOTE — Telephone Encounter (Signed)
So very to hear that she is sick.  She was recently seen for a pulmonary consult for work-up of shortness of breath.  CT chest only showed mild bronchiectasis.   If she is having new symptoms will need an office visit for evaluation I am glad to see her tomorrow in the clinic for work in visit to determine if antibiotics and steroids would be indicated. Use Mucinex DM twice daily as needed for cough and congestion, saline nasal rinses as needed.  Patient has a very significant past medical history multiple medical problems and medications that will need an office visit for evaluation  Please contact office for sooner follow up if symptoms do not improve or worsen or seek emergency care

## 2018-02-16 NOTE — Telephone Encounter (Signed)
Called and spoke with pt's son Shanon Brow letting him know the OTC recs TP is stating pt could try but stated to him we needed to schedule pt an OV to be seen due to the worsening symptoms. Shanon Brow expressed understanding. I scheduled pt an OV with TP tomorrow, 02/17/18 at 11:30. Nothing further needed.

## 2018-02-17 ENCOUNTER — Encounter: Payer: Self-pay | Admitting: Adult Health

## 2018-02-17 ENCOUNTER — Ambulatory Visit (INDEPENDENT_AMBULATORY_CARE_PROVIDER_SITE_OTHER): Payer: Medicare Other | Admitting: Adult Health

## 2018-02-17 DIAGNOSIS — I2510 Atherosclerotic heart disease of native coronary artery without angina pectoris: Secondary | ICD-10-CM

## 2018-02-17 DIAGNOSIS — R0600 Dyspnea, unspecified: Secondary | ICD-10-CM

## 2018-02-17 DIAGNOSIS — R0609 Other forms of dyspnea: Secondary | ICD-10-CM | POA: Diagnosis not present

## 2018-02-17 DIAGNOSIS — J4521 Mild intermittent asthma with (acute) exacerbation: Secondary | ICD-10-CM | POA: Diagnosis not present

## 2018-02-17 DIAGNOSIS — J45909 Unspecified asthma, uncomplicated: Secondary | ICD-10-CM | POA: Insufficient documentation

## 2018-02-17 LAB — NITRIC OXIDE: Nitric Oxide: 5

## 2018-02-17 MED ORDER — PREDNISONE 10 MG PO TABS: ORAL_TABLET | ORAL | 0 refills | Status: DC

## 2018-02-17 NOTE — Assessment & Plan Note (Signed)
Progressive dyspnea with exertion beginning early 2019.  Work-up has been unrevealing thus far.  CT chest without contrast showed mild bronchiectasis and scarring.  No evidence of interstitial lung disease.  Pulmonary function test shows normal lung function and normal DLCO.  Would expect if this was possible interstitial lung disease or scarring and bronchiectasis were contributing her lung function testing would be decreased somewhat.  If symptoms persist could consider doing a high-resolution CT chest.  As she was on Hydrea that can contribute to interstitial lung disease however my suspicion is somewhat low. However her symptoms did begin shortly after starting Hydrea.  Patient was quite active exercising many days a week.  And now is somewhat sedentary with shortness of breath.  She does have some mild diastolic dysfunction that could be contributing.  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  She does not appear to be fluid overloaded.  She does have multiple medical problems that could be contributing to possible deconditioning and symptoms.  For now would continue off of Arimidex, Hydrea and Singulair in case medication side effects are contributing.  Would advance activity as tolerated.  Will treat for possible underlying reactive airways component.  Plan  Patient Instructions  Prednisone taper over next week .  Mucinex DM Twice daily  As needed  Cough /congestion .  Continue on BREO 1 puff daily . Rinse after use.  Advance Activity as tolerated.  Remain off Singulair , Arimidex and Hydrea as discussed .  Follow up as planned next month with Dr. Loanne Drilling and As needed   Please contact office for sooner follow up if symptoms do not improve or worsen or seek emergency care

## 2018-02-17 NOTE — Telephone Encounter (Signed)
I spoke with [name] yesterday, 02/16/18 and due to pt's symptoms worsening, TP who was APP of the day stated that pt needed to come in for an OV.  Pt is coming in today, 02/17/18 at 11:30 to see TP. meds will be addressed at that time. Pt was made aware of the CT results when I spoke with her son Gwendolyn Williams yesterday, 02/16/18. Nothing further needed.

## 2018-02-17 NOTE — Progress Notes (Signed)
@Patient  ID: Gwendolyn Williams, female    DOB: 07-15-1940, 77 y.o.   MRN: [id number]  Chief Complaint  Patient presents with  . Follow-up    Cough/Dyspnea     Referring provider: Gaynelle Arabian, MD  HPI: 77 year old female former smoker seen for pulmonary consult February 10, 2018 for progressive  shortness of breath and chronic cough for 6 months. Had been going on for several years.   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  TEST/EVENTS :  January 19, 2018 PFT normal lung volumes and DLCO.  Significant bronchodilator response CT chest February 12, 2018 diffuse mild bronchiectasis, lingular scarring, pleural and subpleural scarring in the bilateral upper lobes Echo November 26, 2017 EF 55 to 22%, grade 1 diastolic dysfunction  97/98/9211 Follow up : Cough and shortness of breath  Patient presents for a follow up  office visit.  She complains of persistent dyspnea on exertion and dry cough . She has had mild dyspnea over last several years but not enough to limit her activity . She exercised 5 days a week. However 6 months ago, progressive dyspnea on exertion and dry cough . Now limiting her activity .  She gets winded with light housework . Some dyspnea and cough with prolonged talking . She is very frustrated because she wants to be active and now her activity endurance has dramatically decreased. Walking on incline causes more dyspnea .  He does have a dry nonproductive cough.  No wheezing, discolored mucus, fever, orthopnea, edema.  Patient was seen February 10, 2018 for pulmonary consultation for shortness of breath and chronic cough for 6 months.  Patient does have an extensive medical history as above.  PFT showed positive bronchodilator  response.  She was suspected to have possible component of asthma.  She was started on Breo inhaler.  A CT chest was ordered that showed mild diffuse bronchiectasis.  And lingular and subpleural scarring the upper lobes. Reviewed these test results in detail with patient and family.   Patient is followed by hematology and oncology.  She has been on Hydrea since the beginning of this year this has recently been stopped this week due to potential side effects of dyspnea.  Her Arimidex was stopped last month.  There was no evidence of interstitial lung disease on Noncontrasted CT chest (this was not a high-resolution CT chest close ).  Feno tesing today shows 5 ppb  Walk test in office with O2 sat at 99% at rest .,  Walking O2 saturations 92% with heart rate 108.  She also stopped Singulair week..    Allergies  Allergen Reactions  . Penicillins Hives    Has patient had a PCN reaction causing immediate rash, facial/tongue/throat swelling, SOB or lightheadedness with hypotension: Unknown Has patient had a PCN reaction causing severe rash involving mucus membranes or skin necrosis: Unknown Has patient had a PCN reaction that required hospitalization: No Has patient had a PCN reaction occurring within the last 10 years: No If all of the above answers are "NO", then may proceed with Cephalosporin use.   . Sulfa Antibiotics Other (See Comments)    unknown    Immunization History  Administered Date(s) Administered  . Influenza, High Dose Seasonal PF 12/29/2016    Past Medical History:  Diagnosis Date  .  Arthritis   . Asthma    controlled with singulair  . Breast cancer ([place])    stage I left breast  . Current use of long term anticoagulation 04/21/2017  . Dyspnea, unspecified 02/24/2017   Started coincident with Hydrea, exertional and orthostatic, normal exam. O2 sat 100%.  . Family history of breast cancer   . GERD (gastroesophageal reflux disease)   . H/O blood clots    clotting  disorder since 2010  . Hematoma of arm 08/09/2012  . 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 Hyperlipidemia   . Hypertension   . Ischemic colon ([place])    03/2008  . 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 Pneumonia    viral pneumonia as a kid  . PONV (postoperative nausea and vomiting)     Tobacco History: Social History   Tobacco Use  Smoking Status Former Smoker  . Packs/day: 0.50  . Years: 6.00  . Pack years: 3.00  . Last attempt to quit: 01/06/1964  . Years since quitting: 54.1  Smokeless Tobacco Never Used   Counseling given: Not Answered   Outpatient Medications Prior to Visit  Medication Sig Dispense Refill  . albuterol (PROVENTIL HFA;VENTOLIN HFA) 108 (90 Base) MCG/ACT inhaler Inhale 2 puffs into the lungs every 6 (six) hours as needed for wheezing or shortness of breath. 1 Inhaler 2  . aspirin 81 MG tablet Take 81 mg by mouth daily.      Marland Kitchen atorvastatin (LIPITOR) 20 MG tablet Take 1 tablet (20 mg total) by mouth daily. 90 tablet 3  . Cholecalciferol (VITAMIN D) 2000 units CAPS Take 2,000 Units by mouth daily.    Marland Kitchen docusate sodium (COLACE) 100 MG capsule Take 1 capsule (100 mg total) by mouth 2 (two) times daily. (Patient taking differently: Take 100 mg by mouth daily. ) 10 capsule 0  . esomeprazole (NEXIUM) 40 MG capsule Take 40 mg by mouth daily.     . fluticasone furoate-vilanterol (BREO ELLIPTA) 100-25 MCG/INH AEPB Inhale 1 puff into the lungs daily.    . folic acid (FOLVITE) 1 MG tablet Take 1 mg by mouth daily.    Marland Kitchen losartan (COZAAR) 100 MG tablet Take 100 mg by mouth daily.      . ondansetron (ZOFRAN) 4 MG tablet Take 1 tablet (4 mg total) by mouth every 6 (six) hours as needed for nausea. 30 tablet 0  . polyethylene glycol (MIRALAX / GLYCOLAX) packet Take 17 g by mouth 2 (two) times daily. (Patient taking differently: Take 17 g by mouth daily. ) 14 each 0  . rivaroxaban (XARELTO) 20 MG TABS tablet Take 1 tablet (20  mg total) by mouth daily with supper. 90 tablet 3  . triamterene-hydrochlorothiazide (MAXZIDE-25) 37.5-25 MG tablet Take 1 tablet by mouth daily.    . hydroxyurea (HYDREA) 500 MG capsule Take 500 mg by mouth daily. May take with food to minimize GI side effects.    . montelukast (SINGULAIR) 10 MG tablet Take 1 tablet by mouth at bedtime.      No facility-administered medications prior to visit.      Review of Systems  Constitutional:   No  weight loss, night sweats,  Fevers, chills, + fatigue, or  lassitude.  HEENT:   No headaches,  Difficulty swallowing,  Tooth/dental problems, or  Sore throat,                No sneezing, itching, ear ache, nasal congestion, post nasal drip,  CV:  No chest pain,  Orthopnea, PND, swelling in lower extremities, anasarca, dizziness, palpitations, syncope.   GI  No heartburn, indigestion, abdominal pain, nausea, vomiting, diarrhea, change in bowel habits, loss of appetite, bloody stools.   Resp:  .  No chest wall deformity  Skin: no rash or lesions.  GU: no dysuria, change in color of urine, no urgency or frequency.  No flank pain, no hematuria   MS:  No joint pain or swelling.  No decreased range of motion.  No back pain. +hip pain    Physical Exam  BP 114/66 (BP Location: Left Arm, Cuff Size: Normal)   Pulse 86   Ht 4' 11.5" (1.511 m)   Wt 106 lb 12.8 oz (48.4 kg)   SpO2 99%   BMI 21.21 kg/m    GEN: A/Ox3; pleasant , NAD, well nourished    HEENT:  /AT,  EACs-clear, TMs-wnl, NOSE-clear, THROAT-clear, no lesions, no postnasal drip or exudate noted.   NECK:  Supple w/ fair ROM; no JVD; normal carotid impulses w/o bruits; no thyromegaly or nodules palpated; no lymphadenopathy.    RESP  Clear  P & A; w/o, wheezes/ rales/ or rhonchi. no accessory muscle use, no dullness to percussion  CARD:  RRR, no m/r/g, no peripheral edema, pulses intact, no cyanosis or clubbing.  GI:   Soft & nt; nml bowel sounds; no organomegaly or masses detected.     Musco: Warm bil, no deformities or joint swelling noted.   Neuro: alert, no focal deficits noted.    Skin: Warm, no lesions or rashes    Lab Results:  CBC    Component Value Date/Time   WBC 7.1 02/16/2018 1000   RBC 3.96 02/16/2018 1000   HGB 12.6 02/16/2018 1000   HGB 11.8 02/02/2018 1019   HGB 11.8 12/08/2014 1540   HCT 39.5 02/16/2018 1000   HCT 35.3 02/02/2018 1019   HCT 36.8 12/08/2014 1540   PLT 416 (H) 02/16/2018 1000   PLT 352 02/02/2018 1019   MCV 99.7 02/16/2018 1000   MCV 100 (H) 02/02/2018 1019   MCV 72.0 (L) 12/08/2014 1540   MCH 31.8 02/16/2018 1000   MCHC 31.9 02/16/2018 1000   RDW 13.6 02/16/2018 1000   RDW 13.3 02/02/2018 1019   RDW 17.1 (H) 12/08/2014 1540   LYMPHSABS 1.5 02/16/2018 1000   LYMPHSABS 1.1 02/02/2018 1019   LYMPHSABS 1.4 12/08/2014 1540   MONOABS 0.3 02/16/2018 1000   MONOABS 0.6 12/08/2014 1540   EOSABS 0.3 02/16/2018 1000   EOSABS 0.1 02/02/2018 1019   BASOSABS 0.1 02/16/2018 1000   BASOSABS 0.1 02/02/2018 1019   BASOSABS 0.1 12/08/2014 1540    BMET    Component Value Date/Time   NA 140 01/22/2018 0804   NA 137 12/08/2014 1540   K 3.8 01/22/2018 0804   K 4.2 12/08/2014 1540   CL 106 01/22/2018 0804   CL 105 08/03/2012 1529   CO2 26 01/22/2018 0804   CO2 25 12/08/2014 1540   GLUCOSE 57 (L) 01/22/2018 0804   GLUCOSE 106 (H) 10/09/2016 0509   GLUCOSE 84 12/08/2014 1540   GLUCOSE 84 08/03/2012 1529   BUN 32 (H) 01/22/2018 0804   BUN 27.4 (H) 12/08/2014 1540   CREATININE 1.24 (H) 01/22/2018 0804   CREATININE 1.1 12/08/2014 1540   CALCIUM 9.3 01/22/2018 0804   CALCIUM 9.9 12/08/2014 1540   GFRNONAA 43 (L) 01/22/2018 0804   GFRAA 49 (L) 01/22/2018 0804    BNP No results found  for: BNP  ProBNP No results found for: PROBNP  Imaging: Ct Chest Wo Contrast  Result Date: 02/12/2018 CLINICAL DATA:  Increasing shortness of breath. Chronic cough. History of asthma. EXAM: CT CHEST WITHOUT CONTRAST TECHNIQUE:  Multidetector CT imaging of the chest was performed following the standard protocol without IV contrast. COMPARISON:  Calcium scoring CT 01/18/2018 FINDINGS: Cardiovascular: Calcific atherosclerotic disease of the coronary arteries and aorta. Normal heart size. No pericardial effusion. Mediastinum/Nodes: No enlarged mediastinal or axillary lymph nodes. Thyroid gland, trachea, and esophagus demonstrate no significant findings. Lungs/Pleura: Diffuse mild bronchiectasis and mild hyperinflation of the lungs. No pulmonary masses, pulmonary edema or consolidation. Pleural/subpleural scarring in bilateral upper lobes. Lingular scarring. Upper Abdomen: No acute abnormality. Musculoskeletal: Spondylosis of the thoracic spine. IMPRESSION: Mild bronchiectasis and hyperinflation of the lungs, consistent with COPD. No focal consolidation or pulmonary nodules seen. Aortic Atherosclerosis (ICD10-I70.0). Electronically Signed   By: Fidela Salisbury M.D.   On: 02/12/2018 11:38   Ct Coronary Fractional Flow Reserve Fluid Analysis  Result Date: 01/20/2018 CLINICAL DATA:  Chest pain EXAM: CT FFR MEDICATIONS: No additional medications. TECHNIQUE: The coronary CT was sent for FFR. FINDINGS: FFR 0.64 mid LAD FFR 0.94 mid RCA FFR 0.9 mid LCx IMPRESSION: Hemodynamically significant proximal LAD stenosis. Dalton Aundra Dubin Electronically Signed   By: Loralie Champagne M.D.   On: 01/20/2018 07:32      PFT Results Latest Ref Rng & Units 01/19/2018  FVC-Pre L 2.29  FVC-Predicted Pre % 103  FVC-Post L 2.67  FVC-Predicted Post % 120  Pre FEV1/FVC % % 100  Post FEV1/FCV % % 94  FEV1-Pre L 2.29  FEV1-Predicted Pre % 138  FEV1-Post L 2.50  DLCO UNC% % 90  DLCO COR %Predicted % 104  TLC L 5.04  TLC % Predicted % 117  RV % Predicted % 128    No results found for: NITRICOXIDE      Assessment & Plan:   Dyspnea on minimal exertion Progressive dyspnea with exertion beginning early 2019.  Work-up has been unrevealing thus  far.  CT chest without contrast showed mild bronchiectasis and scarring.  No evidence of interstitial lung disease.  Pulmonary function test shows normal lung function and normal DLCO.  Would expect if this was possible interstitial lung disease or scarring and bronchiectasis were contributing her lung function testing would be decreased somewhat.  If symptoms persist could consider doing a high-resolution CT chest.  As she was on Hydrea that can contribute to interstitial lung disease however my suspicion is somewhat low. However her symptoms did begin shortly after starting Hydrea.  Patient was quite active exercising many days a week.  And now is somewhat sedentary with shortness of breath.  She does have some mild diastolic dysfunction that could be contributing.  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  She does not appear to be fluid overloaded.  She does have multiple medical problems that could be contributing to possible deconditioning and symptoms.  For now would continue off of Arimidex, Hydrea and Singulair in case medication side effects are contributing.  Would advance activity as tolerated.  Will treat for possible underlying reactive airways component.  Plan  Patient Instructions  Prednisone taper over next week .  Mucinex DM Twice daily  As needed  Cough /congestion .  Continue on BREO 1 puff daily . Rinse after use.  Advance Activity as tolerated.  Remain off Singulair , Arimidex and Hydrea as discussed .  Follow  up as planned next month with Dr. Loanne Drilling and As needed   Please contact office for sooner follow up if symptoms do not improve or worsen or seek emergency care       Mild reactive airways disease Possible reactive airways disease with significant bronchodilator effect on PFTs.  Patient will continue on Breo inhaler.  Will give a short course of steroids to see if this would help possibly with cough and shortness of breath..    Have close follow-up in 3 to 4 weeks. If symptoms persist will need to investigate further   Plan  Patient Instructions  Prednisone taper over next week .  Mucinex DM Twice daily  As needed  Cough /congestion .  Continue on BREO 1 puff daily . Rinse after use.  Advance Activity as tolerated.  Remain off Singulair , Arimidex and Hydrea as discussed .  Follow up as planned next month with Dr. Loanne Drilling and As needed   Please contact office for sooner follow up if symptoms do not improve or worsen or seek emergency care          Rexene Edison, NP 02/17/2018

## 2018-02-17 NOTE — Patient Instructions (Addendum)
Prednisone taper over next week .  Mucinex DM Twice daily  As needed  Cough /congestion .  Continue on BREO 1 puff daily . Rinse after use.  Advance Activity as tolerated.  Remain off Singulair , Arimidex and Hydrea as discussed .  Follow up as planned next month with Dr. Loanne Drilling and As needed   Please contact office for sooner follow up if symptoms do not improve or worsen or seek emergency care

## 2018-02-17 NOTE — Assessment & Plan Note (Signed)
Possible reactive airways disease with significant bronchodilator effect on PFTs.  Patient will continue on Breo inhaler.  Will give a short course of steroids to see if this would help possibly with cough and shortness of breath..  Have close follow-up in 3 to 4 weeks. If symptoms persist will need to investigate further   Plan  Patient Instructions  Prednisone taper over next week .  Mucinex DM Twice daily  As needed  Cough /congestion .  Continue on BREO 1 puff daily . Rinse after use.  Advance Activity as tolerated.  Remain off Singulair , Arimidex and Hydrea as discussed .  Follow up as planned next month with Dr. Loanne Drilling and As needed   Please contact office for sooner follow up if symptoms do not improve or worsen or seek emergency care

## 2018-02-17 NOTE — Telephone Encounter (Signed)
Please ensure that her clinic is routed to me after visit is completed.

## 2018-02-18 ENCOUNTER — Inpatient Hospital Stay: Admission: RE | Admit: 2018-02-18 | Payer: Medicare Other | Source: Ambulatory Visit

## 2018-02-18 ENCOUNTER — Institutional Professional Consult (permissible substitution): Payer: Medicare Other | Admitting: Pulmonary Disease

## 2018-02-18 MED ORDER — FLUTICASONE FUROATE-VILANTEROL 100-25 MCG/INH IN AEPB: 1.0000 | INHALATION_SPRAY | Freq: Every day | RESPIRATORY_TRACT | 0 refills | Status: DC

## 2018-02-18 NOTE — Addendum Note (Signed)
Addended by: Parke Poisson E on: 02/18/2018 09:44 AM   Modules accepted: Orders

## 2018-02-22 ENCOUNTER — Telehealth: Payer: Self-pay | Admitting: *Deleted

## 2018-02-22 NOTE — Telephone Encounter (Signed)
Call from pt - stated she has an appt to see Dr Beryle Beams on Tuesday 12/3 ; and she wants to come in on Monday for labs. Told her this will be ok; lab appt scheduled Monday @ 0900 AM.

## 2018-03-01 ENCOUNTER — Other Ambulatory Visit (INDEPENDENT_AMBULATORY_CARE_PROVIDER_SITE_OTHER): Payer: Medicare Other

## 2018-03-01 DIAGNOSIS — R0600 Dyspnea, unspecified: Secondary | ICD-10-CM

## 2018-03-01 DIAGNOSIS — D471 Chronic myeloproliferative disease: Secondary | ICD-10-CM | POA: Diagnosis not present

## 2018-03-01 DIAGNOSIS — R0609 Other forms of dyspnea: Secondary | ICD-10-CM

## 2018-03-01 LAB — CBC WITH DIFFERENTIAL/PLATELET
Abs Immature Granulocytes: 0.3 10*3/uL — ABNORMAL HIGH (ref 0.00–0.07)
Basophils Absolute: 0.1 10*3/uL (ref 0.0–0.1)
Basophils Relative: 1 %
Eosinophils Absolute: 0.1 10*3/uL (ref 0.0–0.5)
Eosinophils Relative: 2 %
HCT: 41.2 % (ref 36.0–46.0)
Hemoglobin: 12.6 g/dL (ref 12.0–15.0)
Immature Granulocytes: 4 %
Lymphocytes Relative: 16 %
Lymphs Abs: 1.2 10*3/uL (ref 0.7–4.0)
MCH: 30.1 pg (ref 26.0–34.0)
MCHC: 30.6 g/dL (ref 30.0–36.0)
MCV: 98.3 fL (ref 80.0–100.0)
Monocytes Absolute: 0.7 10*3/uL (ref 0.1–1.0)
Monocytes Relative: 9 %
Neutro Abs: 5.2 10*3/uL (ref 1.7–7.7)
Neutrophils Relative %: 68 %
Platelets: 361 10*3/uL (ref 150–400)
RBC: 4.19 MIL/uL (ref 3.87–5.11)
RDW: 14.6 % (ref 11.5–15.5)
WBC: 7.6 10*3/uL (ref 4.0–10.5)
nRBC: 0 % (ref 0.0–0.2)

## 2018-03-02 ENCOUNTER — Ambulatory Visit (INDEPENDENT_AMBULATORY_CARE_PROVIDER_SITE_OTHER): Payer: Medicare Other | Admitting: Oncology

## 2018-03-02 ENCOUNTER — Encounter: Payer: Self-pay | Admitting: Oncology

## 2018-03-02 ENCOUNTER — Other Ambulatory Visit: Payer: Medicare Other

## 2018-03-02 ENCOUNTER — Other Ambulatory Visit: Payer: Self-pay

## 2018-03-02 VITALS — BP 109/66 | HR 70 | Temp 97.6°F | Ht 61.0 in | Wt 107.9 lb

## 2018-03-02 DIAGNOSIS — Z881 Allergy status to other antibiotic agents status: Secondary | ICD-10-CM

## 2018-03-02 DIAGNOSIS — R0609 Other forms of dyspnea: Secondary | ICD-10-CM | POA: Diagnosis not present

## 2018-03-02 DIAGNOSIS — Z9221 Personal history of antineoplastic chemotherapy: Secondary | ICD-10-CM

## 2018-03-02 DIAGNOSIS — Z7982 Long term (current) use of aspirin: Secondary | ICD-10-CM | POA: Diagnosis not present

## 2018-03-02 DIAGNOSIS — D693 Immune thrombocytopenic purpura: Secondary | ICD-10-CM

## 2018-03-02 DIAGNOSIS — Z7901 Long term (current) use of anticoagulants: Secondary | ICD-10-CM | POA: Diagnosis not present

## 2018-03-02 DIAGNOSIS — Z79890 Hormone replacement therapy (postmenopausal): Secondary | ICD-10-CM | POA: Diagnosis not present

## 2018-03-02 DIAGNOSIS — Z87891 Personal history of nicotine dependence: Secondary | ICD-10-CM | POA: Diagnosis not present

## 2018-03-02 DIAGNOSIS — Z880 Allergy status to penicillin: Secondary | ICD-10-CM

## 2018-03-02 DIAGNOSIS — Z86718 Personal history of other venous thrombosis and embolism: Secondary | ICD-10-CM

## 2018-03-02 DIAGNOSIS — Z79899 Other long term (current) drug therapy: Secondary | ICD-10-CM | POA: Diagnosis not present

## 2018-03-02 DIAGNOSIS — D471 Chronic myeloproliferative disease: Secondary | ICD-10-CM | POA: Diagnosis not present

## 2018-03-02 DIAGNOSIS — Z853 Personal history of malignant neoplasm of breast: Secondary | ICD-10-CM

## 2018-03-02 DIAGNOSIS — J4521 Mild intermittent asthma with (acute) exacerbation: Secondary | ICD-10-CM

## 2018-03-02 DIAGNOSIS — Z923 Personal history of irradiation: Secondary | ICD-10-CM

## 2018-03-02 DIAGNOSIS — K55069 Acute infarction of intestine, part and extent unspecified: Secondary | ICD-10-CM

## 2018-03-02 NOTE — Progress Notes (Signed)
Hematology and Oncology Follow Up Visit  Gwendolyn Williams [id number] 11-02-1940 77 y.o. 03/02/2018 1:59 PM   Principle Diagnosis: Encounter Diagnoses  Name Primary?  . 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 Myeloproliferative disorder ([place]) Yes  . History of breast cancer in female   . Current use of long term anticoagulation   . 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 She has been on full dose Coumadin anticoagulation and low-dose aspirin. She was changed to Xarelto in July 2017. I decided to transition her to the Xarelto based on evolving experience with the new oral anticoagulants with respect to decreased bleeding in patients on long-term anticoagulation compared with Coumadin and increasing experience with unusual site thrombotic events although there is still considerable apprehension about whether or not the new oral anticoagulants provide full protection against arterial thrombosis especially in the setting of triple positive antiphospholipid antibody syndrome. Due to progressive thrombocytosis, she was started on low-dose Hydrea 500 mg daily in November 2018. She had a very nice response with excellent control of her platelet count without fall in her hemoglobin or white count.  She was diagnosed with a stage I, ER/PR positive, HER-2-negative, cancer of the left breast in August 2010. Oncotype DX score high risk. She underwent left lumpectomy on 12/26/2008 followed by adjuvant chemotherapy with 4 cycles of Taxotere plus Cytoxan between 02/16/2009 and 04/20/2009. She then had a course of radiation between February 14 and 07/02/2009. She was started on hormonal therapy with Arimidex  which she continues at this time. BRCA1 and BRCA2 gene testing was negative.  She developed a large chest wall hematoma at time of the surgery related to tight anticoagulation which has long since resolved. She has one son. One daughter age 32(2016).  At the time of a January 19, 2018 visit had developed progressive dyspnea with minimal exertion.  She initially noted the indolent onset of breathing problems coincident with starting Hydrea in November 2018 to control essential thrombocythemia.  Symptoms initially minimal but over the last month or so have progressed rapidly.  She does not get dyspneic walking slowly but anything that requires minimal effort even something as simple as bending down or folding the laundry provokes dyspnea.  She was referred by her primary care physician to cardiology.  Chest x-ray on August 26 which I personally reviewed is overall normal with some suggestion of changes from reactive airway disease with mild hyperinflation.  She had a echocardiogram November 26, 2017 which showed normal systolic function estimated 28-76%, grade 1 diastolic dysfunction, mild aortic regurgitation.Right ventricular size and function normal.  She had a CT coronary study .  Results show calcific atherosclerosis in multiple vessels.  A cardiac cath showed nonobstructive coronary disease.  Her statin was changed to high intensity. Not clear that these findings correlate with her respiratory symptoms.   Her husband is a retired Software engineer.  They read the package insert on Hydrea.  Although rare, some cases of pulmonary fibrosis/interstitial pneumonitis have been reported.  She stopped the drug I did not disagree with this.  I also asked her to stop her Arimidex.  I have seen other patients with unexplained dyspnea on hormonal therapy, in particular, Megace.  I obtain pulmonary function tests with a DLCO and everything was normal. I referred her to pulmonology.  Only  thing in her medical history that  is possibly contributing to her symptoms is a remote history of asthma.  Current lung exam is normal.  No wheezing.  However, she was put on a trial of Breo and a short course of steroids.  Interim History: Short interval follow-up visit.  We have been up and down with respect to her hydroxyurea.  Remote possibility that this is a factor related to her idiopathic dyspnea.  I suggested anagrelide as an alternative but her husband  looked this up and it has also been associated with rare pulmonary symptoms.  Consequently, she decided not to take anything.  Oddly enough, her platelet count went down not up and I suspect that perhaps an inflammatory process in her lungs was causing some degree of secondary thrombocytosis.  She was put on a short course of tapering steroids and does report improvement in her respiratory symptoms.   Medications: reviewed  Allergies:  Allergies  Allergen Reactions  . Penicillins Hives    Has patient had a PCN reaction causing immediate rash, facial/tongue/throat swelling, SOB or lightheadedness with hypotension: Unknown Has patient had a PCN reaction causing severe rash involving mucus membranes or skin necrosis: Unknown Has patient had a PCN reaction that required hospitalization: No Has patient had a PCN reaction occurring within the last 10 years: No If all of the above answers are "NO", then may proceed with Cephalosporin use.   . Sulfa Antibiotics Other (See Comments)    unknown    Review of Systems: See interim history Remaining ROS negative:   Physical Exam: Blood pressure 109/66, pulse 70, temperature 97.6 F (36.4 C), temperature source Oral, height _0  (1.549 m), weight 107 lb 14.4 oz (48.9 kg), SpO2 100 %. Wt Readings from Last 3 Encounters:  03/02/18 107 lb 14.4 oz (48.9 kg)  02/17/18 106 lb 12.8 oz (48.4 kg)  02/10/18 107 lb (48.5 kg)     General appearance: Thin Caucasian woman [name]: Pharynx no erythema, exudate, mass, or ulcer. No  thyromegaly or thyroid nodules Lymph nodes: No cervical, supraclavicular, or axillary lymphadenopathy Breasts:  Lungs: Clear to auscultation, resonant to percussion throughout Heart: Regular rhythm, no murmur, no gallop, no rub, no click, no edema Abdomen: Soft, nontender, normal bowel sounds, no mass, no organomegaly Extremities: No edema, no calf tenderness Musculoskeletal: no joint deformities GU:  Vascular: Carotid pulses 2+, no bruits,  Neurologic: Alert, oriented, PERRLA, , cranial nerves grossly normal, motor strength 5 over 5, reflexes 1+ symmetric, upper body coordination normal, gait normal, Skin: No rash or ecchymosis  Lab Results: CBC W/Diff    Component Value Date/Time   WBC 7.6 03/01/2018 0905   RBC 4.19 03/01/2018 0905   HGB 12.6 03/01/2018 0905   HGB 11.8 02/02/2018 1019   HGB 11.8 12/08/2014 1540   HCT 41.2 03/01/2018 0905   HCT 35.3 02/02/2018 1019   HCT 36.8 12/08/2014 1540   PLT 361 03/01/2018 0905   PLT 352 02/02/2018 1019   MCV 98.3 03/01/2018 0905   MCV 100 (H) 02/02/2018 1019   MCV 72.0 (L) 12/08/2014 1540   MCH 30.1 03/01/2018 0905   MCHC 30.6 03/01/2018 0905   RDW 14.6 03/01/2018 0905   RDW 13.3 02/02/2018 1019   RDW 17.1 (H) 12/08/2014 1540   LYMPHSABS 1.2 03/01/2018 0905   LYMPHSABS 1.1 02/02/2018 1019   LYMPHSABS 1.4 12/08/2014 1540   MONOABS 0.7 03/01/2018 0905   MONOABS 0.6 12/08/2014 1540   EOSABS 0.1 03/01/2018 0905   EOSABS 0.1  02/02/2018 1019   BASOSABS 0.1 03/01/2018 0905   BASOSABS 0.1 02/02/2018 1019   BASOSABS 0.1 12/08/2014 1540     Chemistry      Component Value Date/Time   NA 140 01/22/2018 0804   NA 137 12/08/2014 1540   K 3.8 01/22/2018 0804   K 4.2 12/08/2014 1540   CL 106 01/22/2018 0804   CL 105 08/03/2012 1529   CO2 26 01/22/2018 0804   CO2 25 12/08/2014 1540   BUN 32 (H) 01/22/2018 0804   BUN 27.4 (H) 12/08/2014 1540   CREATININE 1.24 (H) 01/22/2018 0804   CREATININE 1.1 12/08/2014 1540      Component Value  Date/Time   CALCIUM 9.3 01/22/2018 0804   CALCIUM 9.9 12/08/2014 1540   ALKPHOS 40 09/14/2017 0922   ALKPHOS 42 12/08/2014 1540   AST 14 09/14/2017 0922   AST 23 12/08/2014 1540   ALT 10 09/14/2017 0922   ALT 21 12/08/2014 1540   BILITOT 0.4 09/14/2017 0922   BILITOT 0.65 12/08/2014 1540       Radiological Studies: Ct Chest Wo Contrast  Result Date: 02/12/2018 CLINICAL DATA:  Increasing shortness of breath. Chronic cough. History of asthma. EXAM: CT CHEST WITHOUT CONTRAST TECHNIQUE: Multidetector CT imaging of the chest was performed following the standard protocol without IV contrast. COMPARISON:  Calcium scoring CT 01/18/2018 FINDINGS: Cardiovascular: Calcific atherosclerotic disease of the coronary arteries and aorta. Normal heart size. No pericardial effusion. Mediastinum/Nodes: No enlarged mediastinal or axillary lymph nodes. Thyroid gland, trachea, and esophagus demonstrate no significant findings. Lungs/Pleura: Diffuse mild bronchiectasis and mild hyperinflation of the lungs. No pulmonary masses, pulmonary edema or consolidation. Pleural/subpleural scarring in bilateral upper lobes. Lingular scarring. Upper Abdomen: No acute abnormality. Musculoskeletal: Spondylosis of the thoracic spine. IMPRESSION: Mild bronchiectasis and hyperinflation of the lungs, consistent with COPD. No focal consolidation or pulmonary nodules seen. Aortic Atherosclerosis (ICD10-I70.0). Electronically Signed   By: Fidela Salisbury M.D.   On: 02/12/2018 11:38    Impression:  1.JAK-2 positive myeloproliferative disorder: Essential thrombocythemia Well-controlled on low-dose Hydrea.  Currently on a drug holiday in view of unexplained respiratory symptoms.  I am monitoring platelet counts on a monthly basis.  I will reinstitute either Hydrea or a trial of anagrelide if platelet count rises above 500,000.  2.  History of mesenteric vascular thrombosis as the first sign of her myeloproliferative  disorder. Initial anticoagulation with warfarin.  Now on chronic Xarelto full dose with plan to continue anticoagulation indefinitely.  Low-dose aspirin as well.  3.  Idiopathic progressive dyspnea See discussion above.  Some improvement on a trial of steroids and bronchodilators.  Extensive cardiopulmonary evaluation including cardiac cath, CT  angiogram, high resolution CT chest, and pulmonary functions, all unrevealing.  4.  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  She remains free of any obvious recurrence now out over 9 years.  I recently stopped her Arimidex due to potential contribution to her respiratory symptoms.  CC: Patient Care Team: Gaynelle Arabian, MD as PCP - General (Family Medicine) Annia Belt, MD as Consulting Physician (Oncology) Rolm Bookbinder, MD as Consulting Physician (General Surgery)   Murriel Hopper, MD, [place]  Hematology-Oncology/Internal Medicine     12/3/20191:59 PM

## 2018-03-02 NOTE — Patient Instructions (Signed)
Continue to hold Hydrea for now CBC monthly MD visit 8-10 weeks  We will make a referral to Hematologist at [facility] - they should call you for an appointment after the new year. If you don't hear anything by the end of January, call [phone]

## 2018-03-04 ENCOUNTER — Ambulatory Visit: Payer: Medicare Other | Admitting: Cardiovascular Disease

## 2018-03-10 DIAGNOSIS — Z23 Encounter for immunization: Secondary | ICD-10-CM | POA: Diagnosis not present

## 2018-03-11 DIAGNOSIS — M25551 Pain in right hip: Secondary | ICD-10-CM | POA: Diagnosis not present

## 2018-03-12 ENCOUNTER — Other Ambulatory Visit: Payer: Self-pay

## 2018-03-12 ENCOUNTER — Encounter: Payer: Self-pay | Admitting: Pulmonary Disease

## 2018-03-12 ENCOUNTER — Ambulatory Visit (INDEPENDENT_AMBULATORY_CARE_PROVIDER_SITE_OTHER): Payer: Medicare Other | Admitting: Pulmonary Disease

## 2018-03-12 VITALS — BP 96/64 | HR 78 | Wt 109.4 lb

## 2018-03-12 DIAGNOSIS — J479 Bronchiectasis, uncomplicated: Secondary | ICD-10-CM | POA: Diagnosis not present

## 2018-03-12 MED ORDER — FLUTICASONE FUROATE-VILANTEROL 200-25 MCG/INH IN AEPB: 1.0000 | INHALATION_SPRAY | Freq: Every day | RESPIRATORY_TRACT | 3 refills | Status: DC

## 2018-03-12 MED ORDER — FLUTICASONE FUROATE-VILANTEROL 200-25 MCG/INH IN AEPB: 1.0000 | INHALATION_SPRAY | Freq: Every day | RESPIRATORY_TRACT | 0 refills | Status: AC

## 2018-03-12 NOTE — Patient Instructions (Signed)
#  Bronchiectasis --INCREASE Breo Ellipta from 100-17mcg to 200-61mcg. Take 1 puff daily --CONTINUE albuterol inhaler 2 puffs every 4 hours as needed for shortness of breath or wheezing  Labs ordered for further evaluation: --Quantitative immunoglobulins

## 2018-03-12 NOTE — Addendum Note (Signed)
Addended by: Joella Prince on: 03/12/2018 11:07 AM   Modules accepted: Orders

## 2018-03-12 NOTE — Progress Notes (Signed)
Synopsis: Referred in 01/2018 for progressive dyspnea x 6 weeks.  PFTs with reversible obstructive defect and started on ICS/LABA.  CT imaging consistent with mild bronchiectasis.  Subjective:   PATIENT ID: Gwendolyn Williams GENDER: female DOB: 1941-02-21, MRN: [id number]   HPI  Chief Complaint  Patient presents with  . Follow-up    cough almost gone.  has been increasing activity so still having SOB but trying to be more active   [name] is a 77 year old female with history of breast cancer previously on Arimidex, mesenteric thrombus on chronic anticoagulation, ischemic colitis status post left hemicolectomy, thrombocytosis previously on hydroxyurea, chronic diastolic heart failure who presents for follow-up of shortness of breath.  She was last seen in my clinic on 02/10/2018. During that visit PFTs were reviewed and demonstrated significant bronchodilator effect suggesting reversible obstructive airway disease.  She was started on Breo and PRN albuterol.  Since then she has presented for an acute visit with Gwendolyn Edison, NP and treated with prednisone taper.  Today, she reports that after starting Breo her symptoms have improved by 50%.  She still has good and bad days where her dyspnea limits her activity, mainly having difficulty taking deep breaths and being able to talk.  Her cough is only now occasional.  Denies wheezing, chest pain.  She has not been able to return to regular activity since [facility] has closed.  Has tried albuterol which she feels has minimal effect.  Minimal smoking history. 6 pack years. Quit >50 years ago.  Environmental exposures: None  I have personally reviewed patient's past medical/family/social history, allergies, current medications.  Past Medical History:  Diagnosis Date  . Arthritis   . Asthma    controlled with singulair  . Breast cancer ([place])    stage I left breast  . Current use of long term anticoagulation 04/21/2017  . Dyspnea,  unspecified 02/24/2017   Started coincident with Hydrea, exertional and orthostatic, normal exam. O2 sat 100%.  . Family history of breast cancer   . GERD (gastroesophageal reflux disease)   . H/O blood clots    clotting disorder since 2010  . Hematoma of arm 08/09/2012  . Hypercoagulable state, primary ([place])    JAK2 gene defect, thrombocythemia  . Hyperlipidemia   . Hypertension   . 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 Pneumonia    viral pneumonia as a kid  . PONV (postoperative nausea and vomiting)      Family History  Problem Relation Age of Onset  . Breast cancer Mother 15  . Lung cancer Mother   . Heart disease Father   . Basal cell carcinoma Father        x2  . CAD Father   . Ovarian cysts Sister   . Dementia Sister   . Breast cancer Maternal Aunt 42  . Breast cancer Paternal Aunt 17  . Ovarian cysts Maternal Grandmother        possibly had ovarian cancer as well  . Breast cancer Maternal Aunt 20  . Cancer Other        either pancreatic or colon  . Other Daughter 60       breast lumpectomy- complex sclerosing lesion  . Polycystic ovary syndrome Daughter   . Bladder Cancer Cousin      Social History   Occupational History  . Not on file  Tobacco Use  . Smoking status: Former Smoker  Packs/day: 0.50    Years: 6.00    Pack years: 3.00    Last attempt to quit: 01/06/1964    Years since quitting: 54.2  . Smokeless tobacco: Never Used  Substance and Sexual Activity  . Alcohol use: Not Currently    Alcohol/week: 0.0 standard drinks  . Drug use: No  . Sexual activity: Not on file    Allergies  Allergen Reactions  . Penicillins Hives    Has patient had a PCN reaction causing immediate rash, facial/tongue/throat swelling, SOB or lightheadedness with hypotension: Unknown Has patient had a PCN reaction causing severe rash involving mucus membranes or skin necrosis: Unknown Has patient had a PCN reaction that  required hospitalization: No Has patient had a PCN reaction occurring within the last 10 years: No If all of the above answers are "NO", then may proceed with Cephalosporin use.   . Sulfa Antibiotics Other (See Comments)    unknown     Outpatient Medications Prior to Visit  Medication Sig Dispense Refill  . albuterol (PROVENTIL HFA;VENTOLIN HFA) 108 (90 Base) MCG/ACT inhaler Inhale 2 puffs into the lungs every 6 (six) hours as needed for wheezing or shortness of breath. 1 Inhaler 2  . aspirin 81 MG tablet Take 81 mg by mouth daily.      Marland Kitchen atorvastatin (LIPITOR) 20 MG tablet Take 1 tablet (20 mg total) by mouth daily. 90 tablet 3  . Cholecalciferol (VITAMIN D) 2000 units CAPS Take 2,000 Units by mouth daily.    Marland Kitchen dextromethorphan-guaiFENesin (MUCINEX DM) 30-600 MG 12hr tablet Take 1 tablet by mouth 2 (two) times daily.    Marland Kitchen docusate sodium (COLACE) 100 MG capsule Take 1 capsule (100 mg total) by mouth 2 (two) times daily. (Patient taking differently: Take 100 mg by mouth daily. ) 10 capsule 0  . esomeprazole (NEXIUM) 40 MG capsule Take 40 mg by mouth daily.     . folic acid (FOLVITE) 1 MG tablet Take 1 mg by mouth daily.    Marland Kitchen losartan (COZAAR) 100 MG tablet Take 100 mg by mouth daily.      . ondansetron (ZOFRAN) 4 MG tablet Take 1 tablet (4 mg total) by mouth every 6 (six) hours as needed for nausea. 30 tablet 0  . polyethylene glycol (MIRALAX / GLYCOLAX) packet Take 17 g by mouth 2 (two) times daily. (Patient taking differently: Take 17 g by mouth daily. ) 14 each 0  . rivaroxaban (XARELTO) 20 MG TABS tablet Take 1 tablet (20 mg total) by mouth daily with supper. 90 tablet 3  . triamterene-hydrochlorothiazide (MAXZIDE-25) 37.5-25 MG tablet Take 1 tablet by mouth daily.    . fluticasone furoate-vilanterol (BREO ELLIPTA) 100-25 MCG/INH AEPB Inhale 1 puff into the lungs daily.    . fluticasone furoate-vilanterol (BREO ELLIPTA) 100-25 MCG/INH AEPB Inhale 1 puff into the lungs daily. 2 each 0  .  montelukast (SINGULAIR) 10 MG tablet Take 1 tablet by mouth at bedtime.     . predniSONE (DELTASONE) 10 MG tablet 4 tabs for 2 days, then 3 tabs for 2 days, 2 tabs for 2 days, then 1 tab for 2 days, then stop 20 tablet 0   No facility-administered medications prior to visit.     Review of Systems  Constitutional: Negative for chills, diaphoresis, fever, malaise/fatigue and weight loss.  HENT: Negative for congestion and sore throat.   Respiratory: Positive for cough and shortness of breath. Negative for hemoptysis, sputum production and wheezing.   Cardiovascular: Negative for chest pain,  palpitations, orthopnea, leg swelling and PND.  Skin: Negative for rash.  Endo/Heme/Allergies: Does not bruise/bleed easily.     Objective:  Physical Exam Vitals signs reviewed.  Constitutional:      General: She is not in acute distress.    Appearance: She is well-developed. She is not diaphoretic.  HENT:     Head: Normocephalic and atraumatic.     Nose: Nose normal.     Mouth/Throat:     Pharynx: No oropharyngeal exudate.  Eyes:     General: No scleral icterus.    Conjunctiva/sclera: Conjunctivae normal.  Neck:     Musculoskeletal: Normal range of motion and neck supple.     Vascular: No JVD.     Trachea: No tracheal deviation.  Cardiovascular:     Rate and Rhythm: Normal rate and regular rhythm.     Heart sounds: Normal heart sounds. No murmur. No friction rub. No gallop.   Pulmonary:     Effort: Pulmonary effort is normal. No respiratory distress.     Breath sounds: Normal breath sounds. No stridor. No wheezing or rales.  Abdominal:     General: Bowel sounds are normal. There is no distension.     Palpations: Abdomen is soft.     Tenderness: There is no abdominal tenderness.  Musculoskeletal: Normal range of motion.        General: No swelling or deformity.  Lymphadenopathy:     Cervical: No cervical adenopathy.  Skin:    General: Skin is warm and dry.     Coloration: Skin is  not pale.     Findings: No erythema or rash.  Neurological:     Mental Status: She is alert and oriented to person, place, and time.     Cranial Nerves: No cranial nerve deficit.     Sensory: No sensory deficit.  Psychiatric:        Behavior: Behavior normal.        Thought Content: Thought content normal.        Judgment: Judgment normal.      Vitals:   03/12/18 0953  BP: 96/64  Pulse: 78  SpO2: 99%  Weight: 109 lb 6.4 oz (49.6 kg)   SpO2: 99 % O2 Device: None (Room air)  Chest imaging: CXR 11/23/2017 No acute cardiopulmonary processes.  No infiltrate, edema or effusion.  CT chest without contrast 02/12/2018 Diffuse bronchiectasis present.  Mild hyperinflation of the lungs.  PFT: 01/19/2018-normal spirometry lung volumes and DLCO.  Significant bronchodilator effect in FVC.  I have personally reviewed the above labs, images and tests noted above.    Assessment & Plan:   Discussion: 77 year old female who presents for shortness of breath.  Extensive cardiac work-up including LHC which was negative.  PFTs reviewed with patient and demonstrates significant bronchodilator effect.  Benefit from bronchodilator treatment.  CT imaging consistent with bronchiectasis.  #Bronchiectasis, uncontrolled --INCREASE Breo Ellipta from 100-45mcg to 200-90mcg. Take 1 puff daily --CONTINUE albuterol inhaler 2 puffs every 4 hours as needed for shortness of breath or wheezing  Labs ordered for further evaluation: --Quantitative immunoglobulins  Orders Placed This Encounter  Procedures  . IgG, IgA, IgM    Standing Status:   Future    Standing Expiration Date:   03/12/2019  . IgE    Standing Status:   Future    Standing Expiration Date:   03/12/2019   Meds ordered this encounter  Medications  . fluticasone furoate-vilanterol (BREO ELLIPTA) 200-25 MCG/INH AEPB    Sig: Inhale 1  puff into the lungs daily.    Dispense:  1 each    Refill:  3    Return in about 4 months (around  07/12/2018).   Thank you for choosing [facility] for your health needs!   Devi Hopman Rodman Pickle, MD [facility] 03/12/2018 10:45 AM  Personal pager: [phone] If unanswered, please page CCM On-call: [phone]

## 2018-03-15 ENCOUNTER — Telehealth: Payer: Self-pay

## 2018-03-15 LAB — IGG, IGA, IGM
IgG (Immunoglobin G), Serum: 626 mg/dL (ref 600–1540)
IgM, Serum: 88 mg/dL (ref 50–300)
Immunoglobulin A: 113 mg/dL (ref 70–320)

## 2018-03-15 LAB — IGE: IgE (Immunoglobulin E), Serum: 22 kU/L (ref <=114)

## 2018-03-15 NOTE — Telephone Encounter (Signed)
Called re: lab results.  Phone picked up and then hung up.  No one answered.  Will call back at another time.

## 2018-03-15 NOTE — Progress Notes (Signed)
Gwendolyn Williams,  Please contact patient for results. Immunoglobulins are normal. This indicates normal immune system. No additional testing indicated at this time.  J.Telicia Hodgkiss

## 2018-03-16 ENCOUNTER — Telehealth: Payer: Self-pay

## 2018-03-16 NOTE — Telephone Encounter (Signed)
Attempted to contact patient by phone re: lab results.  Phone pick up and hang up on patient end. No answer and no way to leave message.  Will attempt contact once more at another time.

## 2018-03-17 NOTE — Progress Notes (Signed)
Letter with esults of immunoglobulins mailed to patient.  Unable to reach her by phone. Immunoglobulins are normal which indicated normal immune system.  No additional testing needed per Dr. Loanne Drilling

## 2018-03-23 ENCOUNTER — Telehealth: Payer: Self-pay | Admitting: Pulmonary Disease

## 2018-03-23 MED ORDER — AEROCHAMBER MV MISC: 0 refills | Status: DC

## 2018-03-23 NOTE — Telephone Encounter (Signed)
Called and spoke with Patient's Husband Gwendolyn Williams.  He said that the Patient was wanting to know if they could get a spacer, or areochamber for her albuterol inhaler.  Order sent to [facility] per Patient request. Nothing further at this time.

## 2018-03-26 DIAGNOSIS — I119 Hypertensive heart disease without heart failure: Secondary | ICD-10-CM | POA: Diagnosis not present

## 2018-03-26 DIAGNOSIS — E7800 Pure hypercholesterolemia, unspecified: Secondary | ICD-10-CM | POA: Diagnosis not present

## 2018-03-26 DIAGNOSIS — Z5181 Encounter for therapeutic drug level monitoring: Secondary | ICD-10-CM | POA: Diagnosis not present

## 2018-03-26 LAB — COMPREHENSIVE METABOLIC PANEL
ALT: 14 IU/L (ref 0–32)
AST: 16 IU/L (ref 0–40)
Albumin/Globulin Ratio: 2.2 (ref 1.2–2.2)
Albumin: 4.1 g/dL (ref 3.5–4.8)
Alkaline Phosphatase: 58 IU/L (ref 39–117)
BUN/Creatinine Ratio: 22 (ref 12–28)
BUN: 26 mg/dL (ref 8–27)
Bilirubin Total: 0.4 mg/dL (ref 0.0–1.2)
CO2: 23 mmol/L (ref 20–29)
Calcium: 9.7 mg/dL (ref 8.7–10.3)
Chloride: 102 mmol/L (ref 96–106)
Creatinine, Ser: 1.19 mg/dL — ABNORMAL HIGH (ref 0.57–1.00)
GFR calc Af Amer: 52 mL/min/{1.73_m2} — ABNORMAL LOW (ref >59)
GFR calc non Af Amer: 45 mL/min/{1.73_m2} — ABNORMAL LOW (ref >59)
Globulin, Total: 1.9 g/dL (ref 1.5–4.5)
Glucose: 69 mg/dL (ref 65–99)
Potassium: 4.6 mmol/L (ref 3.5–5.2)
Sodium: 140 mmol/L (ref 134–144)
Total Protein: 6 g/dL (ref 6.0–8.5)

## 2018-03-26 LAB — LIPID PANEL
Chol/HDL Ratio: 2.5 ratio (ref 0.0–4.4)
Cholesterol, Total: 163 mg/dL (ref 100–199)
HDL: 64 mg/dL
LDL Calculated: 83 mg/dL (ref 0–99)
Triglycerides: 81 mg/dL (ref 0–149)
VLDL Cholesterol Cal: 16 mg/dL (ref 5–40)

## 2018-03-29 ENCOUNTER — Other Ambulatory Visit: Payer: Self-pay

## 2018-03-29 ENCOUNTER — Telehealth: Payer: Self-pay

## 2018-03-29 DIAGNOSIS — R931 Abnormal findings on diagnostic imaging of heart and coronary circulation: Secondary | ICD-10-CM

## 2018-03-29 DIAGNOSIS — E7800 Pure hypercholesterolemia, unspecified: Secondary | ICD-10-CM

## 2018-03-29 MED ORDER — AEROCHAMBER MV MISC: 0 refills | Status: DC

## 2018-03-29 MED ORDER — ATORVASTATIN CALCIUM 40 MG PO TABS: 40.0000 mg | ORAL_TABLET | Freq: Every day | ORAL | 1 refills | Status: DC

## 2018-03-29 NOTE — Telephone Encounter (Signed)
-----   Message from Skeet Latch, MD sent at 03/28/2018  6:13 PM EST ----- Kidney function and liver function are stable.  Cholesterol levels are pretty good but LDL should be <70.  Increase atorvastatin to 40mg  and repeat lipids/CMP in 2 months.

## 2018-03-29 NOTE — Telephone Encounter (Signed)
Pt aware of lab results and Dr.Randolphs's recommendation. Pt is agreeable with plan. Rx sent to pt pharmacy for Atorvastatin 40mg  daily. #90 R-1. Pt will cone to the office for fasting lipid and cmet in 2 months. Orders in Epic and mailed to pt . Pt verbalized understanding to the instruction given and voiced appreciation for the call.

## 2018-04-01 ENCOUNTER — Encounter: Payer: Self-pay | Admitting: Oncology

## 2018-04-01 MED ORDER — RIVAROXABAN 20 MG PO TABS: 20.0000 mg | ORAL_TABLET | Freq: Every day | ORAL | 3 refills | Status: DC

## 2018-04-01 NOTE — Addendum Note (Signed)
Addended by: Truddie Crumble on: 04/01/2018 04:09 PM   Modules accepted: Orders

## 2018-04-02 ENCOUNTER — Encounter: Payer: Self-pay | Admitting: Oncology

## 2018-04-02 ENCOUNTER — Other Ambulatory Visit (INDEPENDENT_AMBULATORY_CARE_PROVIDER_SITE_OTHER): Payer: Medicare Other

## 2018-04-02 ENCOUNTER — Other Ambulatory Visit: Payer: Self-pay | Admitting: Oncology

## 2018-04-02 DIAGNOSIS — Z7901 Long term (current) use of anticoagulants: Secondary | ICD-10-CM

## 2018-04-02 DIAGNOSIS — K55069 Acute infarction of intestine, part and extent unspecified: Secondary | ICD-10-CM | POA: Diagnosis not present

## 2018-04-02 DIAGNOSIS — D471 Chronic myeloproliferative disease: Secondary | ICD-10-CM

## 2018-04-02 LAB — CBC WITH DIFFERENTIAL/PLATELET
Abs Immature Granulocytes: 0.12 10*3/uL — ABNORMAL HIGH (ref 0.00–0.07)
Basophils Absolute: 0.1 10*3/uL (ref 0.0–0.1)
Basophils Relative: 1 %
Eosinophils Absolute: 0.2 10*3/uL (ref 0.0–0.5)
Eosinophils Relative: 2 %
HCT: 40.3 % (ref 36.0–46.0)
Hemoglobin: 12.1 g/dL (ref 12.0–15.0)
Immature Granulocytes: 1 %
Lymphocytes Relative: 14 %
Lymphs Abs: 1.1 10*3/uL (ref 0.7–4.0)
MCH: 26.7 pg (ref 26.0–34.0)
MCHC: 30 g/dL (ref 30.0–36.0)
MCV: 89 fL (ref 80.0–100.0)
Monocytes Absolute: 0.4 10*3/uL (ref 0.1–1.0)
Monocytes Relative: 5 %
Neutro Abs: 6.5 10*3/uL (ref 1.7–7.7)
Neutrophils Relative %: 77 %
Platelets: 545 10*3/uL — ABNORMAL HIGH (ref 150–400)
RBC: 4.53 MIL/uL (ref 3.87–5.11)
RDW: 16.2 % — ABNORMAL HIGH (ref 11.5–15.5)
WBC: 8.4 10*3/uL (ref 4.0–10.5)
nRBC: 0 % (ref 0.0–0.2)

## 2018-04-02 MED ORDER — FLUTICASONE FUROATE-VILANTEROL 200-25 MCG/INH IN AEPB: 1.0000 | INHALATION_SPRAY | Freq: Every day | RESPIRATORY_TRACT | 3 refills | Status: DC

## 2018-04-02 MED ORDER — HYDROXYUREA 500 MG PO CAPS: 500.0000 mg | ORAL_CAPSULE | Freq: Every day | ORAL | Status: DC

## 2018-04-02 NOTE — Progress Notes (Signed)
Phone conversation with patient: After an extensive cardiac and pulmonary evaluation, etiology of her dyspnea is likely related to complications of chronic asthma and development of bronchiectasis.  It is not felt to be medication related. She has been on a drug holiday for the last 2 months.  Platelet count now drifting up and was 545,000 today.  Hemoglobin and white count stable. She is comfortable going back on the Hydrea 500 mg daily at this time.  We will check another blood count again in about 2 weeks.

## 2018-04-05 ENCOUNTER — Other Ambulatory Visit: Payer: Self-pay | Admitting: *Deleted

## 2018-04-05 MED ORDER — RIVAROXABAN 20 MG PO TABS: 20.0000 mg | ORAL_TABLET | Freq: Every day | ORAL | 3 refills | Status: DC

## 2018-04-05 NOTE — Telephone Encounter (Signed)
Pt wants Xarelto rx sent to [facility]. Thanks

## 2018-04-06 DIAGNOSIS — M1611 Unilateral primary osteoarthritis, right hip: Secondary | ICD-10-CM | POA: Diagnosis not present

## 2018-04-12 ENCOUNTER — Other Ambulatory Visit: Payer: Self-pay | Admitting: Oncology

## 2018-04-12 DIAGNOSIS — I2510 Atherosclerotic heart disease of native coronary artery without angina pectoris: Secondary | ICD-10-CM | POA: Diagnosis not present

## 2018-04-12 DIAGNOSIS — R1013 Epigastric pain: Secondary | ICD-10-CM | POA: Diagnosis not present

## 2018-04-12 DIAGNOSIS — R0789 Other chest pain: Secondary | ICD-10-CM | POA: Diagnosis not present

## 2018-04-12 DIAGNOSIS — K219 Gastro-esophageal reflux disease without esophagitis: Secondary | ICD-10-CM | POA: Diagnosis not present

## 2018-04-12 MED ORDER — HYDROXYUREA 500 MG PO CAPS: 500.0000 mg | ORAL_CAPSULE | Freq: Every day | ORAL | 1 refills | Status: DC

## 2018-04-12 NOTE — Telephone Encounter (Signed)
Refilled 04/02/18 but "No Print". Please re-send. Thanks

## 2018-04-13 ENCOUNTER — Telehealth: Payer: Self-pay | Admitting: *Deleted

## 2018-04-13 NOTE — Telephone Encounter (Signed)
   [facility] Pre-operative Risk Assessment    Request for surgical clearance:  1. What type of surgery is being performed? ENDOSCOPY    2. When is this surgery scheduled? TBD   3. What type of clearance is required (medical clearance vs. Pharmacy clearance to hold med vs. Both)?  4. Are there any medications that need to be held prior to surgery and how long?   5. Practice name and name of physician performing surgery? [facility]   6. What is your office phone number? [phone]     7.   What is your office fax number? [phone]   8.   Anesthesia type (None, local, MAC, general) ?

## 2018-04-15 ENCOUNTER — Encounter: Payer: Self-pay | Admitting: Oncology

## 2018-04-15 ENCOUNTER — Telehealth: Payer: Self-pay

## 2018-04-15 MED ORDER — SPACER/AERO-HOLDING CHAMBERS DEVI: 1.0000 | 0 refills | Status: DC

## 2018-04-15 NOTE — Progress Notes (Signed)
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  She is also on hydroxyurea 500 mg daily to control her platelet count. Colonoscopy planned. Renal function decreased with BUN 26 and creatinine 1.2 on March 26, 2018. Patient will be advised to hold her Xarelto for only 2 days prior to the colonoscopy.  None on the day of the procedure.  Resume evening of procedure if no biopsies are done, next morning if biopsies are done. This patient is high risk for thrombosis and needs tight anticoagulation around the procedure.  Murriel Hopper, MD, [place]  Hematology-Oncology/Internal Medicine [phone]

## 2018-04-15 NOTE — Telephone Encounter (Signed)
Received PA rejection for patients spacer. The wrong spacer was sent in and the insurance would not cover it. Called and spoke with patient, advised her of a more easier route and stated that we have one here in the office she can come pick up she will just need to sign the form. Patient verbalized understanding and thanked me. Patient will come by today or tomorrow to pick up spacer and sign form. Form attached to bag with spacer with note stating patient needs to sign this. Placed up front. Nothing further needed. Prescription printed as well.

## 2018-04-16 ENCOUNTER — Telehealth: Payer: Self-pay | Admitting: *Deleted

## 2018-04-16 ENCOUNTER — Telehealth: Payer: Self-pay | Admitting: Cardiovascular Disease

## 2018-04-16 ENCOUNTER — Other Ambulatory Visit (INDEPENDENT_AMBULATORY_CARE_PROVIDER_SITE_OTHER): Payer: Medicare Other

## 2018-04-16 ENCOUNTER — Telehealth: Payer: Self-pay | Admitting: Pulmonary Disease

## 2018-04-16 DIAGNOSIS — D471 Chronic myeloproliferative disease: Secondary | ICD-10-CM

## 2018-04-16 DIAGNOSIS — Z7901 Long term (current) use of anticoagulants: Secondary | ICD-10-CM | POA: Diagnosis not present

## 2018-04-16 DIAGNOSIS — K55069 Acute infarction of intestine, part and extent unspecified: Secondary | ICD-10-CM

## 2018-04-16 DIAGNOSIS — R110 Nausea: Secondary | ICD-10-CM | POA: Diagnosis not present

## 2018-04-16 DIAGNOSIS — R079 Chest pain, unspecified: Secondary | ICD-10-CM | POA: Diagnosis not present

## 2018-04-16 DIAGNOSIS — R1013 Epigastric pain: Secondary | ICD-10-CM | POA: Diagnosis not present

## 2018-04-16 LAB — CBC WITH DIFFERENTIAL/PLATELET
Abs Immature Granulocytes: 0 10*3/uL (ref 0.00–0.07)
Basophils Absolute: 0.3 10*3/uL — ABNORMAL HIGH (ref 0.0–0.1)
Basophils Relative: 4 %
Eosinophils Absolute: 0.3 10*3/uL (ref 0.0–0.5)
Eosinophils Relative: 3 %
HCT: 42.4 % (ref 36.0–46.0)
Hemoglobin: 12.7 g/dL (ref 12.0–15.0)
Lymphocytes Relative: 17 %
Lymphs Abs: 1.4 10*3/uL (ref 0.7–4.0)
MCH: 26.3 pg (ref 26.0–34.0)
MCHC: 30 g/dL (ref 30.0–36.0)
MCV: 88 fL (ref 80.0–100.0)
Monocytes Absolute: 0.2 10*3/uL (ref 0.1–1.0)
Monocytes Relative: 2 %
Neutro Abs: 6.2 10*3/uL (ref 1.7–7.7)
Neutrophils Relative %: 74 %
Platelets: 481 10*3/uL — ABNORMAL HIGH (ref 150–400)
RBC: 4.82 MIL/uL (ref 3.87–5.11)
RDW: 18 % — ABNORMAL HIGH (ref 11.5–15.5)
WBC: 8.4 10*3/uL (ref 4.0–10.5)
nRBC: 0 % (ref 0.0–0.2)
nRBC: 0 /100 WBC

## 2018-04-16 MED ORDER — NITROGLYCERIN 0.4 MG SL SUBL: SUBLINGUAL_TABLET | SUBLINGUAL | 5 refills | Status: DC

## 2018-04-16 MED ORDER — NITROGLYCERIN 0.4 MG SL SUBL: 0.4000 mg | SUBLINGUAL_TABLET | SUBLINGUAL | 5 refills | Status: DC | PRN

## 2018-04-16 NOTE — Telephone Encounter (Signed)
Pt called / informed "platelets 481,000, trending down back on Hydrea; continue current dose." per Dr Beryle Beams. And we will see her on Tuesday.

## 2018-04-16 NOTE — Telephone Encounter (Signed)
-----   Message from Annia Belt, MD sent at 04/16/2018 11:44 AM EST ----- Call pt: platelets 481,000,  trending down back on Hydrea; continue current dose.

## 2018-04-16 NOTE — Telephone Encounter (Signed)
New Message    *STAT* If patient is at the pharmacy, call can be transferred to refill team.   1. Which medications need to be refilled? (please list name of each medication and dose if known) nitroglycerin   2. Which pharmacy/location (including street and city if local pharmacy) is medication to be sent to? [facility] [address] [place]  3. Do they need a 30 day or 90 day supply? [place]

## 2018-04-16 NOTE — Telephone Encounter (Signed)
Patient came in to pick up aero chamber that was left up front in file cabinet with paper work. Patient signed the form and the form was placed in Dr. Cordelia Pen box//fmb

## 2018-04-16 NOTE — Telephone Encounter (Signed)
Dr Oval Linsey received call from patients GI physician that patient has been having chest pain. Per Dr Oval Linsey Rx NTG and see PA/NP next week. Advised patient and scheduled ov for Monday  04/19/18. Patient also advised to call 911 if she needs 3 NTG in a 15 minute time period.

## 2018-04-16 NOTE — Telephone Encounter (Signed)
I have personally placed these in the folder for [facility] located in libby's room. Nothing further needed.

## 2018-04-18 NOTE — Progress Notes (Signed)
Cardiology Office Note   Date:  04/19/2018   ID:  [name], DOB [birth year], MRN [id number]  PCP:  Gaynelle Arabian, MD  Cardiologist: Dr.[place] No chief complaint on file.    History of Present Illness: Gwendolyn Williams is a 78 y.o. 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  She was last seen by Dr. Oval Linsey on 02/04/2018. She reported DOE. She had a coronary CT that was concerning for pLAD disease, with subsequent cardiac cath on 01/17/2018 that revealed 30% proximal and 50% mid LAD stenoses.  She also had 50% OM 2.  FFR of the LAD was nonsignificant.  It was felt that CAD was not causing her symptoms. PCP stopped anastrozole as he felt that this was causing her dyspnea. She was referred to pulmonology. She was started on Imdur, she was switched to atorvastatin 20 mg daily, with follow up lipids and LFT's.   She comes today with her husband who is a Software engineer with a lengthy list of concerns. She continues to have epigastric/substernal chest pain which she experiences daily. She states she has not felt it today, however. She has been seen by GI and has an endoscopy planned on 04/29/2018.   She is also complaining of muscle aches and pains on Lipitor and ongoing dyspnea and fatigue. She thinks she need to have a repeat cardiac cath, as the chest pain is recurring.   On further questioning she reports that the pain lasts for several hours at a time, sometimes going into her back and jaw. She often grinds her teeth so she is unsure this jaw pain is related or not. She states that when she was on nitrates she could breath better. This was taken off due to headache.   Past Medical History:  Diagnosis Date  . Arthritis   . Asthma    controlled with singulair  . Breast cancer ([place])    stage I left breast  . Current use of long term anticoagulation 04/21/2017  . Dyspnea, unspecified 02/24/2017   Started coincident with Hydrea, exertional and orthostatic, normal exam. O2 sat 100%.  . Family history of breast cancer   . GERD (gastroesophageal reflux disease)   . H/O blood clots    clotting disorder since 2010  . Hematoma of arm 08/09/2012  . 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 Hyperlipidemia   . Hypertension   . 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 Pneumonia    viral pneumonia as a kid  . PONV (postoperative nausea and vomiting)     Past Surgical History:  Procedure Laterality Date  . BREAST SURGERY     Left breast lumpectomy sentinel node biopsy  . CERVICAL SPINE SURGERY  05/23/2008  . COLECTOMY  04/11/2008   left colectomy with end colostomy due to clot  . COLOSTOMY TAKEDOWN  08/14/2008  . EYE SURGERY     bil cataracts  . HERNIA REPAIR  2011   [facility]  . INTRAVASCULAR PRESSURE WIRE/FFR STUDY N/A 01/25/2018   Procedure: INTRAVASCULAR PRESSURE WIRE/FFR STUDY;  Surgeon: Nelva Bush, MD;  Location: [facility];  Service: Cardiovascular;  Laterality: N/A;  . LEFT HEART CATH AND CORONARY ANGIOGRAPHY N/A 01/25/2018   Procedure: LEFT HEART CATH AND CORONARY ANGIOGRAPHY;  Surgeon: Nelva Bush, MD;  Location: [facility];  Service: Cardiovascular;  Laterality: N/A;  . LUMBAR [place] SURGERY  2006  . TOTAL HIP ARTHROPLASTY Left 10/07/2016   Procedure: LEFT TOTAL HIP ARTHROPLASTY ANTERIOR APPROACH;  Surgeon: Paralee Cancel, MD;  Location: [facility];  Service: Orthopedics;  Laterality: Left;  70 mins     Current Outpatient Medications  Medication Sig Dispense Refill  . albuterol (PROVENTIL HFA;VENTOLIN HFA) 108 (90 Base) MCG/ACT inhaler Inhale 2 puffs into the lungs every 6 (six) hours as needed for wheezing or shortness of breath. 1 Inhaler 2  . aspirin 81 MG tablet  Take 81 mg by mouth daily.      Marland Kitchen atorvastatin (LIPITOR) 40 MG tablet Take 1 tablet (40 mg total) by mouth daily. 90 tablet 1  . Cholecalciferol (VITAMIN D) 2000 units CAPS Take 2,000 Units by mouth daily.    Marland Kitchen docusate sodium (COLACE) 100 MG capsule Take 1 capsule (100 mg total) by mouth 2 (two) times daily. (Patient taking differently: Take 100 mg by mouth daily. ) 10 capsule 0  . esomeprazole (NEXIUM) 40 MG capsule Take 40 mg by mouth daily.     . famotidine (PEPCID) 20 MG tablet Take 20 mg by mouth 2 (two) times daily.    . fluticasone furoate-vilanterol (BREO ELLIPTA) 200-25 MCG/INH AEPB Inhale 1 puff into the lungs daily. 831 each 3  . folic acid (FOLVITE) 1 MG tablet Take 1 mg by mouth daily.    . hydroxyurea (HYDREA) 500 MG capsule Take 1 capsule (500 mg total) by mouth daily. May take with food to minimize GI side effects. 90 capsule 1  . losartan (COZAAR) 50 MG tablet Take 50 mg by mouth daily.     . nitroGLYCERIN (NITROSTAT) 0.4 MG SL tablet One tablet under tongue every 5 minutes. If you are still having discomfort after 3 tablets in 15 minutes, call 911. 25 tablet 5  . ondansetron (ZOFRAN) 4 MG tablet Take 1 tablet (4 mg total) by mouth every 6 (six) hours as needed for nausea. 30 tablet 0  . polyethylene glycol (MIRALAX / GLYCOLAX) packet Take 17 g by mouth 2 (two) times daily. (Patient taking differently: Take 17 g by mouth daily. ) 14 each 0  . rivaroxaban (XARELTO) 20 MG TABS tablet Take 1 tablet (20 mg total) by mouth daily with supper. 90 tablet 3  . Spacer/Aero-Holding Chambers DEVI 1 Device by Does not apply route as directed. 1 each 0  . isosorbide mononitrate (IMDUR) 30 MG 24 hr tablet Take 0.5 tablets (15 mg total) by mouth daily. 15 tablet 6   No current facility-administered medications for this visit.     Allergies:   Penicillins and Sulfa antibiotics    Social History:  The patient  reports that she quit smoking about 54 years ago. She has a 3.00 pack-year smoking  history. She has never used smokeless tobacco. She reports previous alcohol use. She reports that she does not use drugs.   Family History:  The patient's family history includes Basal cell carcinoma in her father; Bladder Cancer in her cousin; Breast cancer (age of onset: 13) in her mother; Breast cancer (age of onset: 56) in her maternal aunt and maternal aunt; Breast cancer (age of onset: 11) in her paternal aunt; CAD in her father; Cancer in an other family member; Dementia in her sister; Heart disease in her father; Lung cancer in her mother; Other (age of onset: 53) in her daughter; Ovarian cysts  in her maternal grandmother and sister; Polycystic ovary syndrome in her daughter.    ROS: All other systems are reviewed and negative. Unless otherwise mentioned in H&P    PHYSICAL EXAM: VS:  BP 100/70   Pulse 81   Ht 5' (1.524 m)   Wt 102 lb 9.6 oz (46.5 kg)   BMI 20.04 kg/m  , BMI Body mass index is 20.04 kg/m. GEN: Well nourished, well developed, in no acute distress, anxious.  HEENT: normal Neck: no JVD, carotid bruits, or masses Cardiac: RRR; no murmurs, rubs, or gallops,no edema  Respiratory:  Clear to auscultation bilaterally, normal work of breathing GI: soft, nontender, nondistended, + BS MS: no deformity or atrophy Skin: warm and dry, no rash Neuro:  Strength and sensation are intact Psych: euthymic mood, full affect   EKG:  NSR rate of  81 bpm, possible septal infarct.  Recent Labs: 03/26/2018: ALT 14; BUN 26; Creatinine, Ser 1.19; Potassium 4.6; Sodium 140 04/16/2018: Hemoglobin 12.7; Platelets 481    Lipid Panel    Component Value Date/Time   CHOL 163 03/26/2018 0948   TRIG 81 03/26/2018 0948   HDL 64 03/26/2018 0948   CHOLHDL 2.5 03/26/2018 0948   LDLCALC 83 03/26/2018 0948      Wt Readings from Last 3 Encounters:  04/19/18 102 lb 9.6 oz (46.5 kg)  03/12/18 109 lb 6.4 oz (49.6 kg)  03/02/18 107 lb 14.4 oz (48.9 kg)      Other studies Reviewed: Cardiac  Cath 01/25/2018.  1. Moderate, non-obstructive 2-vessel coronary artery disease.  FFR of proximal and mid LAD is not hemodynamically significant. 2. Normal left ventricular filling pressure.  Recommendations: 1. Medical therapy and risk factor modification to prevent progression of CAD. 2. Epicardial coronary artery disease does not explain the patient's exertional dyspnea.  Consider empiric trial of long-acting nitrate or ranolazine in case there is an element of microvascular dysfunction and further assessment for other causes of dyspnea. 3. If no evidence of bleeding or vascular injury, restart rivaroxaban tomorrow.  Continue low-dose aspirin per Dr. Beryle Beams.  Nelva Bush, MD  ASSESSMENT AND PLAN:  1.  Chronic chest discomfort: She states she felt and breathed better when on nitrates. I will restart Imdur at 15 mg daily to assess if this is helpful to her. If she is having esophageal spasms this may be helpful too. Cath was negative for occlusive disease. Do not think she needs to have a repeat cath.   2. Hypertension: Will addition of nitrates, I will discontinue Maxide to prevent hypotension. BP is soft in the office today, and do not think she will tolerate losartan, triamterene, and nitrates. I have explained to her that she may have a headache on the Imdur. She verbalizes understanding. I will see her in a couple of weeks to evaluate her response to med changes.   3 Pre-Op evaluation: She is to have EDG with Dr. Michail Sermon, [facility] 04/29/2018. She may hold Xarelto for 48 hours prior to procedure.    Chart reviewed as part of pre-operative protocol coverage. Given past medical history and time since last visit, based on ACC/AHA guidelines, JADA FASS would be at acceptable risk for the planned procedure without further cardiovascular testing.   4. Myalgia Pain: I have advised her to stop statin for 1 week to ascertain if this is causing her pain. If she finds no improvement,  she will need to go back on it.   Current medicines are reviewed at length with  the patient today.  I have spent 45 minutes with the patient and her husband today during this office visit .  Labs/ tests ordered today include: BMET and Mg.   Phill Myron. [place] Pugh, ANP, AACC   04/19/2018 5:24 PM    [place] [address] Office [phone] Fax [phone]

## 2018-04-19 ENCOUNTER — Ambulatory Visit (INDEPENDENT_AMBULATORY_CARE_PROVIDER_SITE_OTHER): Payer: Medicare Other | Admitting: Adult Health

## 2018-04-19 ENCOUNTER — Encounter: Payer: Self-pay | Admitting: Adult Health

## 2018-04-19 VITALS — BP 100/70 | HR 81 | Ht 60.0 in | Wt 102.6 lb

## 2018-04-19 DIAGNOSIS — I10 Essential (primary) hypertension: Secondary | ICD-10-CM

## 2018-04-19 DIAGNOSIS — Z79899 Other long term (current) drug therapy: Secondary | ICD-10-CM | POA: Diagnosis not present

## 2018-04-19 DIAGNOSIS — E7800 Pure hypercholesterolemia, unspecified: Secondary | ICD-10-CM

## 2018-04-19 DIAGNOSIS — Z01810 Encounter for preprocedural cardiovascular examination: Secondary | ICD-10-CM

## 2018-04-19 DIAGNOSIS — R079 Chest pain, unspecified: Secondary | ICD-10-CM | POA: Diagnosis not present

## 2018-04-19 DIAGNOSIS — I2510 Atherosclerotic heart disease of native coronary artery without angina pectoris: Secondary | ICD-10-CM | POA: Diagnosis not present

## 2018-04-19 MED ORDER — ISOSORBIDE MONONITRATE ER 30 MG PO TB24: 15.0000 mg | ORAL_TABLET | Freq: Every day | ORAL | 6 refills | Status: DC

## 2018-04-19 MED ORDER — NITROGLYCERIN 0.4 MG SL SUBL: SUBLINGUAL_TABLET | SUBLINGUAL | 5 refills | Status: DC

## 2018-04-19 NOTE — Patient Instructions (Addendum)
Medication Instructions:  STOP TRIAMTERENE-HCTZ  START ISOSORBIDE MONO 15MG  (1/2 TAB) DAILY  If you need a refill on your cardiac medications before your next appointment, please call your pharmacy.  Labwork: BMET AND CBC TODAY HERE IN OUR OFFICE AT [facility]    Take the provided lab slips with you to the lab for your blood draw.    When you have your labs (blood work) drawn today and your tests are completely normal, you will receive your results only by MyChart Message (if you have MyChart) -OR-  A paper copy in the mail.  If you have any lab test that is abnormal or we need to change your treatment, we will call you to review these results.  Special Instructions: HOLD atorvastatin (LIPITOR) 40 MG tablet FOR 1 WEEK  Follow-Up: You will need a follow up appointment in 1 months.  You may see Skeet Latch, MD Jory Sims, DNP, AACC  or one of the following Advanced Practice Providers on your designated Care Team:  Kerin Ransom, PA-C  Roby Lofts, PA-C  Sande Rives, [facility], you and your health needs are our priority.  As part of our continuing mission to provide you with exceptional heart care, we have created designated Provider Care Teams.  These Care Teams include your primary Cardiologist (physician) and Advanced Practice Providers (APPs -  Physician Assistants and Nurse Practitioners) who all work together to provide you with the care you need, when you need it.  Thank you for choosing [facility] at [facility]!!

## 2018-04-20 ENCOUNTER — Encounter: Payer: Self-pay | Admitting: Oncology

## 2018-04-20 ENCOUNTER — Other Ambulatory Visit: Payer: Self-pay

## 2018-04-20 ENCOUNTER — Ambulatory Visit (INDEPENDENT_AMBULATORY_CARE_PROVIDER_SITE_OTHER): Payer: Medicare Other | Admitting: Oncology

## 2018-04-20 VITALS — BP 86/52 | HR 72 | Temp 97.4°F | Ht 61.0 in | Wt 103.1 lb

## 2018-04-20 DIAGNOSIS — D473 Essential (hemorrhagic) thrombocythemia: Secondary | ICD-10-CM | POA: Diagnosis not present

## 2018-04-20 DIAGNOSIS — D471 Chronic myeloproliferative disease: Secondary | ICD-10-CM | POA: Diagnosis not present

## 2018-04-20 DIAGNOSIS — Z853 Personal history of malignant neoplasm of breast: Secondary | ICD-10-CM | POA: Diagnosis not present

## 2018-04-20 DIAGNOSIS — Z923 Personal history of irradiation: Secondary | ICD-10-CM | POA: Diagnosis not present

## 2018-04-20 DIAGNOSIS — Z7901 Long term (current) use of anticoagulants: Secondary | ICD-10-CM

## 2018-04-20 DIAGNOSIS — Z881 Allergy status to other antibiotic agents status: Secondary | ICD-10-CM | POA: Diagnosis not present

## 2018-04-20 DIAGNOSIS — Z7982 Long term (current) use of aspirin: Secondary | ICD-10-CM | POA: Diagnosis not present

## 2018-04-20 DIAGNOSIS — R1013 Epigastric pain: Secondary | ICD-10-CM | POA: Diagnosis not present

## 2018-04-20 DIAGNOSIS — Z9221 Personal history of antineoplastic chemotherapy: Secondary | ICD-10-CM

## 2018-04-20 DIAGNOSIS — Z880 Allergy status to penicillin: Secondary | ICD-10-CM

## 2018-04-20 DIAGNOSIS — Z87891 Personal history of nicotine dependence: Secondary | ICD-10-CM | POA: Diagnosis not present

## 2018-04-20 DIAGNOSIS — I2510 Atherosclerotic heart disease of native coronary artery without angina pectoris: Secondary | ICD-10-CM | POA: Diagnosis not present

## 2018-04-20 DIAGNOSIS — Z79899 Other long term (current) drug therapy: Secondary | ICD-10-CM

## 2018-04-20 DIAGNOSIS — N183 Chronic kidney disease, stage 3 unspecified: Secondary | ICD-10-CM

## 2018-04-20 DIAGNOSIS — M818 Other osteoporosis without current pathological fracture: Secondary | ICD-10-CM

## 2018-04-20 DIAGNOSIS — T386X5A Adverse effect of antigonadotrophins, antiestrogens, antiandrogens, not elsewhere classified, initial encounter: Secondary | ICD-10-CM

## 2018-04-20 LAB — BASIC METABOLIC PANEL
BUN/Creatinine Ratio: 26 (ref 12–28)
BUN: 37 mg/dL — ABNORMAL HIGH (ref 8–27)
CO2: 23 mmol/L (ref 20–29)
Calcium: 9.3 mg/dL (ref 8.7–10.3)
Chloride: 102 mmol/L (ref 96–106)
Creatinine, Ser: 1.42 mg/dL — ABNORMAL HIGH (ref 0.57–1.00)
GFR calc Af Amer: 42 mL/min/{1.73_m2} — ABNORMAL LOW (ref >59)
GFR calc non Af Amer: 36 mL/min/{1.73_m2} — ABNORMAL LOW (ref >59)
Glucose: 81 mg/dL (ref 65–99)
Potassium: 3.9 mmol/L (ref 3.5–5.2)
Sodium: 143 mmol/L (ref 134–144)

## 2018-04-20 LAB — MAGNESIUM: Magnesium: 2.4 mg/dL — ABNORMAL HIGH (ref 1.6–2.3)

## 2018-04-20 NOTE — Patient Instructions (Signed)
Return for lab Monday Feb 3 Return MD visit as needed before April 1,   It has been a pleasure working with you. I wish you all the best.

## 2018-04-20 NOTE — Telephone Encounter (Signed)
   Primary Cardiologist: Skeet Latch, MD  Chart reviewed, see office visit  as part of pre-operative protocol coverage. Given past medical history and time since last visit, based on ACC/AHA guidelines, Gwendolyn Williams would be at acceptable risk for the planned procedure without further cardiovascular testing.   OK to hold Xarelto 48 hours pre op.   I will route this recommendation to the requesting party via Epic fax function and remove from pre-op pool.  Please call with questions.  Kerin Ransom, PA-C 04/20/2018, 11:08 AM

## 2018-04-21 NOTE — Progress Notes (Signed)
Hematology and Oncology Follow Up Visit  Gwendolyn Williams [id number] 12-15-1940 78 y.o. 04/21/2018 9:19 AM   Principle Diagnosis: Encounter Diagnoses  Name Primary?  . 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 Current use of long term anticoagulation   . History of breast cancer in female   . Myeloproliferative disorder ([place]) Yes  . 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 She was been on full dose Coumadin anticoagulation and low-dose aspirin. She was changed to Xarelto in July 2017. I decided to transition her to the Xarelto based on evolving experience with the new oral anticoagulants with respect to decreased bleeding in patients on long-term anticoagulation compared with Coumadin and increasing experience with unusual site thrombotic events although there is still considerable apprehension about whether or not the new oral anticoagulants provide full protection against arterial thrombosis especially in the setting of triple positive antiphospholipid antibody syndrome. Due to progressive thrombocytosis, she was started on low-dose Hydrea 500 mg daily in November 2018. She had a very nice response with excellent control of her platelet count without fall in her hemoglobin or white count.  Hydrea temporarily with hold due to respiratory symptoms which developed in the fall 2019 but subsequently resumed in January 2020.  She was diagnosed with a stage I, ER/PR positive, HER-2-negative, cancer of the left breast in August 2010. Oncotype DX score high risk. She underwent left lumpectomy on 12/26/2008 followed by adjuvant chemotherapy with 4 cycles of Taxotere plus Cytoxan between 02/16/2009  and 04/20/2009. She then had a course of radiation between February 14 and 07/02/2009. She was started on hormonal therapy with Arimidex which she continues at this time. BRCA1 and BRCA2 gene testing was negative.  She developed a large chest wall hematoma at time of the surgery related to tight anticoagulation which has long since resolved. She has one son. One daughter age 10(2016).  At the time of a January 19, 2018 visit had developed progressive dyspnea with minimal exertion. She initially noted the indolent onset of breathing problems coincident with starting Hydrea in November 2018 to control essential thrombocythemia.Symptoms initially minimal but over the last month or so have progressed rapidly. She does not get dyspneic walking slowly but anything that requires minimal effort even something as simple as bending down or folding the laundry provokes dyspnea. She was referred by her primary care physician to cardiology. Chest x-ray on August 26 which I personally reviewed is overall normal with some suggestion of changes from reactive airway disease with mild hyperinflation. She had a echocardiogram November 26, 2017 which showed normal systolic function estimated 89-21%, grade 1 diastolic dysfunction, mild aortic regurgitation.Right ventricular size and function normal. She had a CT coronary study . Results show calcific atherosclerosis in multiple vessels.  A cardiac cath showed nonobstructive coronary disease.  Her statin was changed to high intensity.Not clear that these findings correlate with her respiratory symptoms.   Her husband is a retired Software engineer. They read the package insert on Hydrea. Although rare, some cases of pulmonary fibrosis/interstitial pneumonitis have been reported. She stopped the drug I did not disagree with this.  I also asked her to stop her Arimidex.  I have seen other patients with unexplained dyspnea on hormonal therapy, in particular, Megace.  I  obtain pulmonary function tests with a DLCO and  everything was normal. I referred her to pulmonology.  Only thing in her medical history that is possibly contributing to her symptoms is a remote history of asthma.  Current lung exam is normal.  No wheezing.  However, she was put on a trial of Breo and a short course of steroids and does report improvement in her respiratory symptoms.  Interim History: In a brief interval since I saw her last she is developed new symptoms with difficult to explain epigastric discomfort which can last for a few hours.  She had similar symptoms many years ago.  They resolved completely only to have abruptly returned at this time.  Symptoms did not seem to be precipitated by meals, supine position, and or not associated with nausea, vomiting, or dysphagia.  She saw Dr. Michail Sermon, gastroenterology, he was not convinced that her symptoms were GI in origin but is scheduling upper endoscopy.  I told her I thought her symptoms sounded like esophageal spasm.  She said Dr. Michail Sermon also brought up this possibility but did not think it was likely.  Her cardiologist does not feel her symptoms are cardiac in nature especially in view of recent extensive evaluation including cardiac cath. Dr. Michail Sermon put on a combination of both a PPI and an H2 blocker.  She is back on Hydrea.  Platelets trending down but she is only been on the drug for 2 weeks.  Previously she did not require more than 500 mg daily for good control of her platelet count. She continues on low-dose aspirin and Xarelto.  Noted today is decrease in her already low baseline blood pressure, increase in her BUN and creatinine.  She was on a thiazide containing diuretic which was just stopped.  She is also on a fairly large dose of imdur.  Medications: reviewed  Allergies:  Allergies  Allergen Reactions  . Penicillins Hives    Has patient had a PCN reaction causing immediate rash, facial/tongue/throat swelling, SOB or  lightheadedness with hypotension: Unknown Has patient had a PCN reaction causing severe rash involving mucus membranes or skin necrosis: Unknown Has patient had a PCN reaction that required hospitalization: No Has patient had a PCN reaction occurring within the last 10 years: No If all of the above answers are "NO", then may proceed with Cephalosporin use.   . Sulfa Antibiotics Other (See Comments)    unknown    Review of Systems: See interim history Remaining ROS negative:   Physical Exam: Blood pressure (!) 86/52, pulse 72, temperature (!) 97.4 F (36.3 C), temperature source Oral, height '5\' 1"'$  (1.549 m), weight 103 lb 1.6 oz (46.8 kg), SpO2 100 %. Wt Readings from Last 3 Encounters:  04/20/18 103 lb 1.6 oz (46.8 kg)  04/19/18 102 lb 9.6 oz (46.5 kg)  03/12/18 109 lb 6.4 oz (49.6 kg)     General appearance: Petite, frail, Caucasian woman [name]: Pharynx no erythema, exudate, mass, or ulcer. No thyromegaly or thyroid nodules Lymph nodes: No cervical, supraclavicular, or axillary lymphadenopathy Breasts:  Lungs: Clear to auscultation, resonant to percussion throughout Heart: Regular rhythm, no murmur, no gallop, no rub, no click, no edema Abdomen: Soft, nontender, normal bowel sounds, no mass, no organomegaly Extremities: No edema, no calf tenderness Musculoskeletal: no joint deformities GU:  Vascular: Carotid pulses 2+, no bruits,  Neurologic: Alert, oriented, PERRLA,, cranial nerves grossly normal, motor strength 5 over 5, reflexes 1+ symmetric, upper body coordination normal, gait normal, Skin: No rash or ecchymosis  Lab Results: CBC W/Diff  Component Value Date/Time   WBC 8.4 04/16/2018 0938   RBC 4.82 04/16/2018 0938   HGB 12.7 04/16/2018 0938   HGB 11.8 02/02/2018 1019   HGB 11.8 12/08/2014 1540   HCT 42.4 04/16/2018 0938   HCT 35.3 02/02/2018 1019   HCT 36.8 12/08/2014 1540   PLT 481 (H) 04/16/2018 0938   PLT 352 02/02/2018 1019   MCV 88.0 04/16/2018 0938    MCV 100 (H) 02/02/2018 1019   MCV 72.0 (L) 12/08/2014 1540   MCH 26.3 04/16/2018 0938   MCHC 30.0 04/16/2018 0938   RDW 18.0 (H) 04/16/2018 0938   RDW 13.3 02/02/2018 1019   RDW 17.1 (H) 12/08/2014 1540   LYMPHSABS 1.4 04/16/2018 0938   LYMPHSABS 1.1 02/02/2018 1019   LYMPHSABS 1.4 12/08/2014 1540   MONOABS 0.2 04/16/2018 0938   MONOABS 0.6 12/08/2014 1540   EOSABS 0.3 04/16/2018 0938   EOSABS 0.1 02/02/2018 1019   BASOSABS 0.3 (H) 04/16/2018 0938   BASOSABS 0.1 02/02/2018 1019   BASOSABS 0.1 12/08/2014 1540     Chemistry      Component Value Date/Time   NA 143 04/19/2018 1507   NA 137 12/08/2014 1540   K 3.9 04/19/2018 1507   K 4.2 12/08/2014 1540   CL 102 04/19/2018 1507   CL 105 08/03/2012 1529   CO2 23 04/19/2018 1507   CO2 25 12/08/2014 1540   BUN 37 (H) 04/19/2018 1507   BUN 27.4 (H) 12/08/2014 1540   CREATININE 1.42 (H) 04/19/2018 1507   CREATININE 1.1 12/08/2014 1540      Component Value Date/Time   CALCIUM 9.3 04/19/2018 1507   CALCIUM 9.9 12/08/2014 1540   ALKPHOS 58 03/26/2018 0948   ALKPHOS 42 12/08/2014 1540   AST 16 03/26/2018 0948   AST 23 12/08/2014 1540   ALT 14 03/26/2018 0948   ALT 21 12/08/2014 1540   BILITOT 0.4 03/26/2018 0948   BILITOT 0.65 12/08/2014 1540       Radiological Studies: No results found.  Impression:  1. JAK-2 positive myeloproliferative disorder: Essential thrombocythemia Back on low-dose Hydrea 500 mg daily and continues on aspirin 81 mg daily.  2.  Mesenteric vascular thrombosis as first sign of essential thrombocythemia. Continue chronic anticoagulation with Xarelto and low-dose aspirin.  3.  Progressive pulmonary symptoms Felt possibly related to bronchiectasis and chronic asthma with no other obvious etiology determined after extensive cardiopulmonary evaluation.  4.  Nonobstructive cardiac disease.  5.  New onset atypical epigastric symptoms. See discussion above.  Started on a trial of dual H2 blocker and  PPI therapy.  Endoscopy scheduled.  6.  Relative hypotension with evidence for dehydration.  Rise in  BUN and creatinine over her baseline.  5 pound weight loss.  Not eating well due to new GI symptoms which started after Thanksgiving. Her diuretic was held.  Losartan dose decreased down from 100 to 50 mg daily.  In view of rising creatinine, I told her to stop the losartan after discussion with her primary care physician.  I also told her to hold her Imdur.  Allow her body to equilibrate.  She is coming back in 2 weeks to check a CBC.  I will repeat a BMET at that time.  7.  Stage I ER positive, HER-2 negative, cancer of the left breast diagnosed August 2010.  Treatment as outlined above.  She was on Arimidex hormonal therapy for 9 years.  Discontinued in November 2019 in view of potential to be partially responsible for  her respiratory symptoms.   I will transition her hematology oncology care to Dr. Sullivan Lone at the [place] [facility] at this time.    CC: Patient Care Team: Gaynelle Arabian, MD as PCP - General (Family Medicine) Skeet Latch, MD as PCP - Cardiology (Cardiology) Annia Belt, MD as Consulting Physician (Oncology) Rolm Bookbinder, MD as Consulting Physician (General Surgery)   Murriel Hopper, MD, FACP  Hematology-Oncology/Internal Medicine     1/22/20209:19 AM

## 2018-04-22 ENCOUNTER — Telehealth: Payer: Self-pay | Admitting: Cardiovascular Disease

## 2018-04-22 NOTE — Telephone Encounter (Signed)
Spoke with pt's husband who report he sent a Pharmacist, community message for Gwendolyn Reader, DNP and wanted to make sure Gwendolyn Williams was aware and what she recommends. Message listed below. Husband verbalized pt is not having chest pain right at this moment but felt some type of cardiac workup should be done. Appointment scheduled for Monday 04/26/2018 with Gwendolyn Sims, DNP. Will route to MD  Update  Part 1   Re: Gwendolyn Williams  DOB 1940/04/28   This is an update to apprise you of both medication changes and the results of Gwendolyn Williams's use of  NTG 0.4MG  for the intermittent chest pain.   04/19/2018 - At office visit with Gwendolyn Williams Isosorbide Mononitrate 15MG  QD was added to Gwendolyn Williams regimen (we had some at home from Gwendolyn Williams).   04/20/2018 - At a previously scheduled appointment with Dr. Murriel Williams, Gwendolyn Williams hematologist/oncologist, her BP registered [phone] and we were advised to DC Losartan 50MG , Triamterene/HCTZ 37.5/25, and the Isosorbide Mononitrate. Following this advice the BP returned to ~115/78 the following day.   After being episode-free for ~ 5 days the pain returned though less intense than before. In the afternoon Gwendolyn Williams took one Nitroglycerine 0.4, followed by a second which lessened the discomfort. A more severe pain returned in the late evening and she again took one NTG 0.4. This time the pain was relieved and another Nitroglycerine was unnecessary.   This encounter is not signed. The conversation may still be ongoing.    Part 2   Conclusion/Guess - Since the NTG was found useful in alleviating the pain, it seems that this is a cardiac issue rather than a G.I. problem and that a further cardiac workup be done. Though the cardiac cath. is recognized as the gold standard, perhaps in this case the degree of occlusion was misjudged, or that with even only a 50% occlusion of the LAD she is experiencing pain.   It might be useful to reintroduce the Isosorbide Mono. after her BP has stably  risen to a reasonable degree.   Another pressing issue is that Gwendolyn Williams has an endoscopy scheduled for 04/30/2018 and we don't know if it is necessary at this juncture or should be postponed until a further cardiac workup.   Please confirm receipt of this message so that we may be assured of a fast follow-up.   Thank you,   Gwendolyn Williams

## 2018-04-22 NOTE — Telephone Encounter (Signed)
Advised husband of Dr Quintella Reichert recommendations. He would like to wait a couple more days before starting Imdur and to keep follow up Monday with Arnold Long NP

## 2018-04-22 NOTE — Telephone Encounter (Signed)
New message:    Patient husband calling concerning medication issue. They are having some issue. Please call patient back.

## 2018-04-22 NOTE — Telephone Encounter (Signed)
Nitroglycerin both opens coronary arteries and relaxes esophageal spasm.  Therefore, the fact that it alleviates her chest pain does not automatically mean it is cardiac.  Repeating her heart cath three months later is not helpful, especially when they did an FFR (and extra study that checks to see whether a blockage leads to significant drop in blood flow) that was normal.  Now that her BP is higher restarting Imdur 15mg  is fine.  I think she needs to go ahead with her endoscopy to rule out esophageal problems before we consider any more cardiac testing.  There are no better cardiac tests to perform.

## 2018-04-23 ENCOUNTER — Ambulatory Visit (INDEPENDENT_AMBULATORY_CARE_PROVIDER_SITE_OTHER): Payer: Medicare Other | Admitting: Gynecology

## 2018-04-23 ENCOUNTER — Encounter: Payer: Self-pay | Admitting: Gynecology

## 2018-04-23 VITALS — BP 110/70 | Ht 60.0 in | Wt 105.0 lb

## 2018-04-23 DIAGNOSIS — N952 Postmenopausal atrophic vaginitis: Secondary | ICD-10-CM

## 2018-04-23 DIAGNOSIS — M810 Age-related osteoporosis without current pathological fracture: Secondary | ICD-10-CM

## 2018-04-23 DIAGNOSIS — Z853 Personal history of malignant neoplasm of breast: Secondary | ICD-10-CM

## 2018-04-23 DIAGNOSIS — Z9289 Personal history of other medical treatment: Secondary | ICD-10-CM

## 2018-04-23 DIAGNOSIS — Z01419 Encounter for gynecological examination (general) (routine) without abnormal findings: Secondary | ICD-10-CM

## 2018-04-23 NOTE — Patient Instructions (Signed)
Follow-up in 1 year for annual exam 

## 2018-04-23 NOTE — Telephone Encounter (Signed)
I have also answered this patient's husband by email

## 2018-04-23 NOTE — Progress Notes (Signed)
    Gwendolyn Williams 1940/10/23 [id number]        77 y.o.  G2P0002 for breast and pelvic exam.  Without gynecologic complaints  Past medical history,surgical history, problem list, medications, allergies, family history and social history were all reviewed and documented as reviewed in the EPIC chart.  ROS:  Performed with pertinent positives and negatives included in the history, assessment and plan.   Additional significant findings : None   Exam: [name] assistant Vitals:   04/23/18 1001  BP: 110/70  Weight: 105 lb (47.6 kg)  Height: 5' (1.524 m)   Body mass index is 20.51 kg/m.  General appearance:  Normal affect, orientation and appearance. Skin: Grossly normal HEENT: Without gross lesions.  No cervical or supraclavicular adenopathy. Thyroid normal.  Lungs:  Clear without wheezing, rales or rhonchi Cardiac: RR, without RMG Abdominal:  Soft, nontender, without masses, guarding, rebound, organomegaly or hernia Breasts:  Examined lying and sitting.  Right without masses, retractions, discharge or axillary adenopathy.  Left with old lumpectomy scar retraction.  No masses, nipple discharge or axillary adenopathy Pelvic:  Ext, BUS, Vagina: With atrophic changes  Cervix: With atrophic changes  Uterus: Axial to anteverted, normal size, shape and contour, midline and mobile nontender   Adnexa: Without masses or tenderness    Anus and perineum: Normal   Rectovaginal: Normal sphincter tone without palpated masses or tenderness.    Assessment/Plan:  78 y.o. G69P0002 female for breast and pelvic exam  1. Postmenopausal.  Without significant menopausal symptoms or any bleeding. 2. Osteoporosis.  DEXA 2019.  Managed by her primary physician. 3. Pap smear 2014.  No Pap smear done today.  No history of abnormal Pap smears.  We agreed to stop screening per current screening guidelines. 4. Colonoscopy 2016.  Repeat at their recommended interval. 5. Mammography 12/2017.  History of  left-sided breast cancer.  Exam NED. 6. Health maintenance.  No routine lab work done as patient does this elsewhere.  Follow-up 1 year, sooner as needed.   Anastasio Auerbach MD, 10:29 AM 04/23/2018

## 2018-04-25 NOTE — Progress Notes (Signed)
Cardiology Office Note   Date:  04/26/2018   ID:  Gwendolyn Williams, DOB Nov 17, 1940, MRN [id number]  PCP:  Gaynelle Arabian, MD  Cardiologist:  Dr. Oval Linsey  Chief Complaint  Patient presents with  . Hypertension  . Chest Pain     History of Present Illness: Gwendolyn Williams is a 78 y.o. 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  She had a coronary CT that was concerning for pLAD disease, with subsequent cardiac cath on 01/17/2018 that revealed 30% proximal and 50% mid LAD stenoses. She also had 50% OM 2. FFR of the LAD was nonsignificant. It was felt that CAD was not causing her symptoms. PCP stopped anastrozole as he felt that this was causing her dyspnea. She was referred to pulmonology. She was started on Imdur, she was switched to atorvastatin 20 mg daily, with follow up lipids and LFT's.   She was last seen on 04/19/2018 with her husband who is a Software engineer with multiple questions and ongoing chest pain. She is scheduled for a 04/19/2018. She also complained of muscle aches and pain. She wanted to have a repeat cardiac cath due to her ongoing symptoms. She was taking nitrates in the past which helped her breathing and chest discomfort in the past.   She was restarted on Imdur 15 mg daily, to improve her breathing and to help with pain. If she was also having esophageal spasms the nitrates would be helpful. Maxzide was discontinued to prevent hypotension. She was also advised to stop the statin to assist with myalgia pain if this is etiology. If there was no change she was to restart.  She is here is here for follow up of symptoms.   Her husband sent an email 04/22/2018, that was in "Part 1" and "Part II." Several more questions and request to repeat cardiac cath. She was advised to  follow up with GI for endoscopy.   She continues to have chest pain and muscle aches and pains despite changes in medication regimen. Imdur had not helped. Her husband suggested she go back on triamterene/HCTZ because her feet became edematous and she gained 5 lbs.  Doing so improved her edema. Stopping statin did not improved her symptoms.   Past Medical History:  Diagnosis Date  . Arthritis   . Asthma    controlled with singulair  . Breast cancer ([place])    stage I left breast  . Current use of long term anticoagulation 04/21/2017  . Dyspnea, unspecified 02/24/2017   Started coincident with Hydrea, exertional and orthostatic, normal exam. O2 sat 100%.  . Family history of breast cancer   . GERD (gastroesophageal reflux disease)   . H/O blood clots    clotting disorder since 2010  . Hematoma of arm 08/09/2012  . 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 Hyperlipidemia   . Hypertension   . 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 Pneumonia    viral pneumonia as a kid  . PONV (postoperative nausea and vomiting)     Past Surgical History:  Procedure Laterality Date  . BREAST SURGERY     Left breast lumpectomy sentinel node biopsy  . CERVICAL SPINE SURGERY  05/23/2008  . COLECTOMY  04/11/2008   left colectomy with  end colostomy due to clot  . COLOSTOMY TAKEDOWN  08/14/2008  . EYE SURGERY     bil cataracts  . HERNIA REPAIR  2011   [facility]  . INTRAVASCULAR PRESSURE WIRE/FFR STUDY N/A 01/25/2018   Procedure: INTRAVASCULAR PRESSURE WIRE/FFR STUDY;  Surgeon: Nelva Bush, MD;  Location: [facility];  Service: Cardiovascular;  Laterality: N/A;  . LEFT HEART CATH AND CORONARY ANGIOGRAPHY N/A 01/25/2018   Procedure: LEFT HEART CATH AND CORONARY ANGIOGRAPHY;  Surgeon: Nelva Bush, MD;  Location: [facility];  Service: Cardiovascular;  Laterality: N/A;  . LUMBAR [place] SURGERY  2006  . TOTAL  HIP ARTHROPLASTY Left 10/07/2016   Procedure: LEFT TOTAL HIP ARTHROPLASTY ANTERIOR APPROACH;  Surgeon: Paralee Cancel, MD;  Location: [facility];  Service: Orthopedics;  Laterality: Left;  70 mins     Current Outpatient Medications  Medication Sig Dispense Refill  . albuterol (PROVENTIL HFA;VENTOLIN HFA) 108 (90 Base) MCG/ACT inhaler Inhale 2 puffs into the lungs every 6 (six) hours as needed for wheezing or shortness of breath. 1 Inhaler 2  . aspirin 81 MG tablet Take 81 mg by mouth daily.      . Cholecalciferol (VITAMIN D) 2000 units CAPS Take 2,000 Units by mouth daily.    Marland Kitchen docusate sodium (COLACE) 100 MG capsule Take 1 capsule (100 mg total) by mouth 2 (two) times daily. (Patient taking differently: Take 100 mg by mouth daily. ) 10 capsule 0  . esomeprazole (NEXIUM) 40 MG capsule Take 40 mg by mouth daily.     . famotidine (PEPCID) 20 MG tablet Take 20 mg by mouth 2 (two) times daily.    . folic acid (FOLVITE) 1 MG tablet Take 1 mg by mouth daily.    . hydroxyurea (HYDREA) 500 MG capsule Take 1 capsule (500 mg total) by mouth daily. May take with food to minimize GI side effects. 90 capsule 1  . hyoscyamine (LEVSIN SL) 0.125 MG SL tablet Place 0.125 mg under the tongue daily as needed.    . nitroGLYCERIN (NITROSTAT) 0.4 MG SL tablet One tablet under tongue every 5 minutes. If you are still having discomfort after 3 tablets in 15 minutes, call 911. 25 tablet 5  . ondansetron (ZOFRAN) 4 MG tablet Take 1 tablet (4 mg total) by mouth every 6 (six) hours as needed for nausea. 30 tablet 0  . polyethylene glycol (MIRALAX / GLYCOLAX) packet Take 17 g by mouth 2 (two) times daily. (Patient taking differently: Take 17 g by mouth daily. ) 14 each 0  . rivaroxaban (XARELTO) 20 MG TABS tablet Take 1 tablet (20 mg total) by mouth daily with supper. 90 tablet 3  . Spacer/Aero-Holding Chambers DEVI 1 Device by Does not apply route as directed. 1 each 0  . triamterene-hydrochlorothiazide (DYAZIDE) 37.5-25 MG  capsule Take 1 capsule by mouth daily.    Marland Kitchen atorvastatin (LIPITOR) 40 MG tablet Take 1 tablet (40 mg total) by mouth daily at 6 PM. 90 tablet 1   No current facility-administered medications for this visit.     Allergies:   Penicillins; Sulfa antibiotics; and Tamiflu [oseltamivir phosphate]    Social History:  The patient  reports that she quit smoking about 54 years ago. She has a 3.00 pack-year smoking history. She has never used smokeless tobacco. She reports current alcohol use. She reports that she does not use drugs.   Family History:  The patient's family history includes Basal cell carcinoma in her father; Bladder Cancer in her cousin;  Breast cancer (age of onset: 70) in her mother; Breast cancer (age of onset: 85) in her maternal aunt and maternal aunt; Breast cancer (age of onset: 73) in her paternal aunt; CAD in her father; Cancer in an other family member; Dementia in her sister; Heart disease in her father; Lung cancer in her mother; Other (age of onset: 48) in her daughter; Ovarian cysts in her maternal grandmother and sister; Polycystic ovary syndrome in her daughter.    ROS: All other systems are reviewed and negative. Unless otherwise mentioned in H&P    PHYSICAL EXAM: VS:  BP 101/70   Pulse 72   Ht 5' (1.524 m)   Wt 106 lb 6.4 oz (48.3 kg)   BMI 20.78 kg/m  , BMI Body mass index is 20.78 kg/m. GEN: Well nourished, well developed, in no acute distress HEENT: normal Neck: no JVD, carotid bruits, or masses Cardiac: RRR; no murmurs, rubs, or gallops,no edema  Respiratory:  Clear to auscultation bilaterally, normal work of breathing GI: soft, nontender, nondistended, + BS MS: no deformity or atrophy Skin: warm and dry, no rash Neuro:  Strength and sensation are intact Psych: euthymic mood, full affect   EKG: NSR rare of 72 bpm, with left axis deviation.   Recent Labs: 03/26/2018: ALT 14 04/16/2018: Hemoglobin 12.7; Platelets 481 04/19/2018: BUN 37; Creatinine, Ser  1.42; Magnesium 2.4; Potassium 3.9; Sodium 143    Lipid Panel    Component Value Date/Time   CHOL 163 03/26/2018 0948   TRIG 81 03/26/2018 0948   HDL 64 03/26/2018 0948   CHOLHDL 2.5 03/26/2018 0948   LDLCALC 83 03/26/2018 0948      Wt Readings from Last 3 Encounters:  04/26/18 106 lb 6.4 oz (48.3 kg)  04/23/18 105 lb (47.6 kg)  04/20/18 103 lb 1.6 oz (46.8 kg)      Other studies Reviewed: Cardiac Cath 01/25/2018.  1. Moderate, non-obstructive 2-vessel coronary artery disease. FFR of proximal and mid LAD is not hemodynamically significant. 2. Normal left ventricular filling pressure.  Recommendations: 1. Medical therapy and risk factor modification to prevent progression of CAD. 2. Epicardial coronary artery disease does not explain the patient's exertional dyspnea. Consider empiric trial of long-acting nitrate or ranolazine in case there is an element of microvascular dysfunction and further assessment for other causes of dyspnea. 3. If no evidence of bleeding or vascular injury, restart rivaroxaban tomorrow. Continue low-dose aspirin per Dr. Beryle Beams.  ASSESSMENT AND PLAN:  1.  Chest pain: Does not appear to be cardiac in etiology. Cath has been reviewed and several questions answered in person and by email. They are still concerned that this is cardiac in etiology. I have recommended that they seek second opinion.   She is due for EGD on 04/29/2018. She is encouraged to proceed with this.   2. CAD: Non-obstructive per cath, with normal FFR of the proximal and mid LAD is not hemodynamically significant.   3. Hypertension: She will stay off of the losartan for now and go back on Triamterene as this is helping her BP and edema.  Stop Imdur  4. Hypercholesterolemia: Stopping statin did not help with myalgia pain. She is advised to go back on this for cardioprotection and prevention of further CAD progression.   5. Hx of Breast Cancer:  She may be having some scar or  neurologic pain from 32 sessions of radiation at that time. Defer to PCP for follow up on this.   Current medicines are reviewed at length with  the patient today.  Multiple questions answered.  Labs/ tests ordered today include: None  Phill Myron. [place] Pugh, ANP, AACC   04/26/2018 12:52 PM    [address] Office [phone] Fax [phone]

## 2018-04-26 ENCOUNTER — Encounter: Payer: Self-pay | Admitting: Adult Health

## 2018-04-26 ENCOUNTER — Ambulatory Visit (INDEPENDENT_AMBULATORY_CARE_PROVIDER_SITE_OTHER): Payer: Medicare Other | Admitting: Adult Health

## 2018-04-26 VITALS — BP 101/70 | HR 72 | Ht 60.0 in | Wt 106.4 lb

## 2018-04-26 DIAGNOSIS — I10 Essential (primary) hypertension: Secondary | ICD-10-CM

## 2018-04-26 DIAGNOSIS — I200 Unstable angina: Secondary | ICD-10-CM

## 2018-04-26 DIAGNOSIS — I2510 Atherosclerotic heart disease of native coronary artery without angina pectoris: Secondary | ICD-10-CM

## 2018-04-26 MED ORDER — ATORVASTATIN CALCIUM 40 MG PO TABS: 40.0000 mg | ORAL_TABLET | Freq: Every day | ORAL | 1 refills | Status: DC

## 2018-04-26 NOTE — Patient Instructions (Signed)
Medication Instructions:  RE-START TRIAMTERENE DAILY  RE-START ATORVASTATIN DAILY STOP IMDUR  If you need a refill on your cardiac medications before your next appointment, please call your pharmacy.  Labwork: When you have your labs (blood work) drawn today and your tests are completely normal, you will receive your results only by MyChart Message (if you have MyChart) -OR-  A paper copy in the mail.  If you have any lab test that is abnormal or we need to change your treatment, we will call you to review these results.  Follow-Up: You will need a follow up appointment in 6 months.  Please call our office 2 months in advance  (June 2020) to schedule this (AUGUST 2020) appointment.  You may see Skeet Latch, MD or one of the following Advanced Practice Providers on your designated Care Team:  Kerin Ransom, [place]  Roby Lofts, PA-C  Sande Rives, [place]    At [facility], you and your health needs are our priority.  As part of our continuing mission to provide you with exceptional heart care, we have created designated Provider Care Teams.  These Care Teams include your primary Cardiologist (physician) and Advanced Practice Providers (APPs -  Physician Assistants and Nurse Practitioners) who all work together to provide you with the care you need, when you need it.  Thank you for choosing [facility] at [facility]!!

## 2018-04-29 ENCOUNTER — Telehealth: Payer: Self-pay | Admitting: *Deleted

## 2018-04-29 NOTE — Telephone Encounter (Signed)
Patient of Dr. Beryle Beams. Asking when she will be contacted by scheduling. Last appt with Dr. Beryle Beams 1/21 and he wanted to see her before 4/1. Per Dr. Irene Limbo, after review of records, will keep appt schedule the same. Patient contacted with this information and message sent to scheduling.

## 2018-04-30 DIAGNOSIS — K449 Diaphragmatic hernia without obstruction or gangrene: Secondary | ICD-10-CM | POA: Diagnosis not present

## 2018-04-30 DIAGNOSIS — R079 Chest pain, unspecified: Secondary | ICD-10-CM | POA: Diagnosis not present

## 2018-04-30 DIAGNOSIS — R1013 Epigastric pain: Secondary | ICD-10-CM | POA: Diagnosis not present

## 2018-04-30 DIAGNOSIS — K2930 Chronic superficial gastritis without bleeding: Secondary | ICD-10-CM | POA: Diagnosis not present

## 2018-05-03 ENCOUNTER — Telehealth: Payer: Self-pay | Admitting: *Deleted

## 2018-05-03 ENCOUNTER — Other Ambulatory Visit (INDEPENDENT_AMBULATORY_CARE_PROVIDER_SITE_OTHER): Payer: Medicare Other

## 2018-05-03 DIAGNOSIS — D471 Chronic myeloproliferative disease: Secondary | ICD-10-CM | POA: Diagnosis not present

## 2018-05-03 DIAGNOSIS — N183 Chronic kidney disease, stage 3 unspecified: Secondary | ICD-10-CM

## 2018-05-03 DIAGNOSIS — Z7901 Long term (current) use of anticoagulants: Secondary | ICD-10-CM

## 2018-05-03 LAB — CBC WITH DIFFERENTIAL/PLATELET
Abs Immature Granulocytes: 0.07 10*3/uL (ref 0.00–0.07)
Basophils Absolute: 0.1 10*3/uL (ref 0.0–0.1)
Basophils Relative: 1 %
Eosinophils Absolute: 0.1 10*3/uL (ref 0.0–0.5)
Eosinophils Relative: 2 %
HCT: 42.5 % (ref 36.0–46.0)
Hemoglobin: 12.6 g/dL (ref 12.0–15.0)
Immature Granulocytes: 1 %
Lymphocytes Relative: 14 %
Lymphs Abs: 0.9 10*3/uL (ref 0.7–4.0)
MCH: 25.8 pg — ABNORMAL LOW (ref 26.0–34.0)
MCHC: 29.6 g/dL — ABNORMAL LOW (ref 30.0–36.0)
MCV: 86.9 fL (ref 80.0–100.0)
Monocytes Absolute: 0.3 10*3/uL (ref 0.1–1.0)
Monocytes Relative: 5 %
Neutro Abs: 4.8 10*3/uL (ref 1.7–7.7)
Neutrophils Relative %: 77 %
Platelets: 400 10*3/uL (ref 150–400)
RBC: 4.89 MIL/uL (ref 3.87–5.11)
RDW: 22.3 % — ABNORMAL HIGH (ref 11.5–15.5)
WBC: 6.2 10*3/uL (ref 4.0–10.5)
nRBC: 0 % (ref 0.0–0.2)

## 2018-05-03 LAB — BASIC METABOLIC PANEL
Anion gap: 11 (ref 5–15)
BUN: 25 mg/dL — ABNORMAL HIGH (ref 8–23)
CO2: 27 mmol/L (ref 22–32)
Calcium: 9.9 mg/dL (ref 8.9–10.3)
Chloride: 103 mmol/L (ref 98–111)
Creatinine, Ser: 1.32 mg/dL — ABNORMAL HIGH (ref 0.44–1.00)
GFR calc Af Amer: 46 mL/min — ABNORMAL LOW (ref >60)
GFR calc non Af Amer: 39 mL/min — ABNORMAL LOW (ref >60)
Glucose, Bld: 97 mg/dL (ref 70–99)
Potassium: 3.6 mmol/L (ref 3.5–5.1)
Sodium: 141 mmol/L (ref 135–145)

## 2018-05-03 NOTE — Telephone Encounter (Signed)
Pt called / informed "platelets down to 400,000; other counts stable. Stay on current dose of Hydrea. If count stable next month, can change to every other month lab checks." per Dr Beryle Beams.  Lab appt scheduled 3/3 @ 1000 AM.

## 2018-05-03 NOTE — Addendum Note (Signed)
Addended by: Truddie Crumble on: 05/03/2018 10:20 AM   Modules accepted: Orders

## 2018-05-03 NOTE — Telephone Encounter (Signed)
-----   Message from Annia Belt, MD sent at 05/03/2018 12:46 PM EST ----- Call pt: platelets down to 400,000; other counts stable. Stay on current dose of Hydrea. If count stable next month, can change to every other month lab checks.

## 2018-05-04 ENCOUNTER — Telehealth: Payer: Self-pay | Admitting: Hematology

## 2018-05-04 NOTE — Telephone Encounter (Signed)
Scheduled appt per 1/30 sch message - pt is aware of appt date and time   

## 2018-05-05 DIAGNOSIS — M1611 Unilateral primary osteoarthritis, right hip: Secondary | ICD-10-CM | POA: Diagnosis not present

## 2018-05-05 DIAGNOSIS — Z471 Aftercare following joint replacement surgery: Secondary | ICD-10-CM | POA: Diagnosis not present

## 2018-05-05 DIAGNOSIS — K2930 Chronic superficial gastritis without bleeding: Secondary | ICD-10-CM | POA: Diagnosis not present

## 2018-05-05 DIAGNOSIS — Z96642 Presence of left artificial hip joint: Secondary | ICD-10-CM | POA: Diagnosis not present

## 2018-05-05 DIAGNOSIS — M25551 Pain in right hip: Secondary | ICD-10-CM | POA: Diagnosis not present

## 2018-05-19 ENCOUNTER — Telehealth: Payer: Self-pay | Admitting: *Deleted

## 2018-05-19 NOTE — Telephone Encounter (Signed)
Spoke with patient and "weakness in legs" better. Will forward to Dr Oval Linsey for review   ----- Message -----  From: Christella Noa  Sent: 05/18/2018  1:34 PM EST  To: Cv Div Nl Triage  Subject: Non-Urgent Medical Question             Dr. Oval Linsey,    I was having some leg-muscle weakness after being on the Lipitor 40MG . I DC'd it on Feb. 12th, 2020 and the muscle weakness remitted.  I'm supposed to go in for a lipid check next week, but I believe that it is unnecessary at this time.    I am unsure about restarting the Lipitor at 20MG  or should I go back to the Simvastatin 20MG ?    Thank you,  Rulon Abide

## 2018-05-19 NOTE — Telephone Encounter (Signed)
If stopping atorvastatin helped then we won't retry it.  Simvastatin will not get her LDL to 70.  Let's try rosuvastatin 20mg  instead to see if she tolerates this better.  Repeat lipids/CMP in 3 months.

## 2018-05-24 ENCOUNTER — Ambulatory Visit: Payer: Medicare Other | Admitting: Adult Health

## 2018-05-28 MED ORDER — ROSUVASTATIN CALCIUM 20 MG PO TABS: 20.0000 mg | ORAL_TABLET | Freq: Every day | ORAL | 1 refills | Status: DC

## 2018-05-28 NOTE — Telephone Encounter (Signed)
Advised patient, verbalized understanding  

## 2018-06-01 ENCOUNTER — Other Ambulatory Visit: Payer: Medicare Other

## 2018-06-02 ENCOUNTER — Other Ambulatory Visit (INDEPENDENT_AMBULATORY_CARE_PROVIDER_SITE_OTHER): Payer: Medicare Other

## 2018-06-02 DIAGNOSIS — D471 Chronic myeloproliferative disease: Secondary | ICD-10-CM | POA: Diagnosis not present

## 2018-06-02 DIAGNOSIS — Z7901 Long term (current) use of anticoagulants: Secondary | ICD-10-CM

## 2018-06-02 DIAGNOSIS — K55069 Acute infarction of intestine, part and extent unspecified: Secondary | ICD-10-CM | POA: Diagnosis not present

## 2018-06-02 LAB — CBC WITH DIFFERENTIAL/PLATELET
Abs Immature Granulocytes: 0.07 10*3/uL (ref 0.00–0.07)
Basophils Absolute: 0.1 10*3/uL (ref 0.0–0.1)
Basophils Relative: 1 %
Eosinophils Absolute: 0.1 10*3/uL (ref 0.0–0.5)
Eosinophils Relative: 1 %
HCT: 41.5 % (ref 36.0–46.0)
Hemoglobin: 12.4 g/dL (ref 12.0–15.0)
Immature Granulocytes: 1 %
Lymphocytes Relative: 13 %
Lymphs Abs: 0.8 10*3/uL (ref 0.7–4.0)
MCH: 26.2 pg (ref 26.0–34.0)
MCHC: 29.9 g/dL — ABNORMAL LOW (ref 30.0–36.0)
MCV: 87.7 fL (ref 80.0–100.0)
Monocytes Absolute: 0.3 10*3/uL (ref 0.1–1.0)
Monocytes Relative: 4 %
Neutro Abs: 5.4 10*3/uL (ref 1.7–7.7)
Neutrophils Relative %: 80 %
Platelets: 325 10*3/uL (ref 150–400)
RBC: 4.73 MIL/uL (ref 3.87–5.11)
RDW: 25.5 % — ABNORMAL HIGH (ref 11.5–15.5)
WBC: 6.7 10*3/uL (ref 4.0–10.5)
nRBC: 0 % (ref 0.0–0.2)

## 2018-06-03 ENCOUNTER — Telehealth: Payer: Self-pay | Admitting: *Deleted

## 2018-06-03 NOTE — Telephone Encounter (Signed)
Pt called / informed "counts good. Platelets 325,000. Stay on current dose of Hydrea. " per Dr Beryle Beams. Verbalized understanding.

## 2018-06-03 NOTE — Telephone Encounter (Signed)
-----   Message from Annia Belt, MD sent at 06/02/2018  3:33 PM EST ----- Call pt: counts good. Platelets 325,000. Stay on current dose of Hydrea.

## 2018-06-09 ENCOUNTER — Other Ambulatory Visit: Payer: Self-pay | Admitting: Physician Assistant

## 2018-06-09 DIAGNOSIS — R1013 Epigastric pain: Secondary | ICD-10-CM

## 2018-06-09 DIAGNOSIS — R0789 Other chest pain: Secondary | ICD-10-CM | POA: Diagnosis not present

## 2018-06-14 ENCOUNTER — Ambulatory Visit
Admission: RE | Admit: 2018-06-14 | Discharge: 2018-06-14 | Disposition: A | Discharge status: Home / Self Care | Payer: Medicare Other | Source: Ambulatory Visit | Attending: Physician Assistant | Admitting: Physician Assistant

## 2018-06-14 DIAGNOSIS — K824 Cholesterolosis of gallbladder: Secondary | ICD-10-CM | POA: Diagnosis not present

## 2018-06-14 DIAGNOSIS — R1013 Epigastric pain: Secondary | ICD-10-CM

## 2018-06-18 ENCOUNTER — Encounter: Payer: Self-pay | Admitting: *Deleted

## 2018-06-21 ENCOUNTER — Other Ambulatory Visit: Payer: Self-pay | Admitting: *Deleted

## 2018-06-21 DIAGNOSIS — Z853 Personal history of malignant neoplasm of breast: Secondary | ICD-10-CM

## 2018-06-21 NOTE — Progress Notes (Signed)
HEMATOLOGY/ONCOLOGY CONSULTATION NOTE  Date of Service: 06/22/2018  Patient Care Team: Gaynelle Arabian, MD as PCP - General (Family Medicine) Skeet Latch, MD as PCP - Cardiology (Cardiology) Annia Belt, MD as Consulting Physician (Oncology) Rolm Bookbinder, MD as Consulting Physician (General Surgery)  CHIEF COMPLAINTS/PURPOSE OF CONSULTATION:  Essential Thrombocytosis  HISTORY OF PRESENTING ILLNESS:   Gwendolyn Williams is a wonderful 78 y.o. female who has been referred to [place] by Dr. Gaynelle Arabian, and was previously under the care of Dr. 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 The pt reports that she is doing well overall.   The pt reports that she has seen Dr. Michail Sermon in GI and is concerned for esophageal spasm but does not wish to proceed with the diagnostic work up with esophageal manometry. She notes that muscle relaxants help with this occasional pain. She also follows up with Dr. Donne Hazel in surgery to determine if this related to her previous abdominal surgeries. 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  The pt notes that she does not have any other present concerns. She notes that she had SOB at the end of last year, as part of bronchiectasis, and takes Memory Dance once a day, denies needing her rescue inhaler. She denies positional elements to her SOB.  The pt notes that she was off Hyxdroxyurea during the Fall of 2019 during this work up. She then returned to '500mg'$  Hydoxyurea once a day and her PLT have normalized. She takes this in the morning with her food and denies any problems tolerating this medication.   The pt denies any concerns for bleeding at this time.  The pt continues with annual mammograms with her OBGYN Dr. Donalynn Furlong. The pt pursued maintenance Arimidex for 8.5 years, and stopped during the  evaluation of her respiratory symptoms in Fall 2019. The pt completes a bone density study every year, and notes she has stable osteopenia. She takes Vitamin D replacement and notes her levels are good.  She continues on '20mg'$  Xarelto. She continues on '81mg'$  aspirin as well.  The pt notes that she has had chronic mouth ulcers for "decades." She has tried Vitamin B complex and mouthwashes. She notes that she routinely gets mouth ulcers after her dental cleanings as well.  Most recent lab results (06/22/18) of CBC w/diff and CMP is as follows: all values are WNL except for RDW at 25.2, Potassium at 3.3, BUN at 30, Creatinine at 1.23, GFR at 43.  On review of systems, pt reports intermittent esophageal discomfort, stable breathing, good energy levels, some abdominal tenderness, chronic mouth ulcers, and denies concerns for bleeding, and any other symptoms.  MEDICAL HISTORY:  Past Medical History:  Diagnosis Date   Arthritis    Asthma    controlled with singulair   Breast cancer ([place])    stage I left breast   Current use of long term anticoagulation 04/21/2017   Dyspnea, unspecified 02/24/2017   Started coincident with Hydrea, exertional and orthostatic, normal exam. O2 sat 100%.   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   Pneumonia    viral pneumonia as a kid   PONV (postoperative nausea and vomiting)     SURGICAL HISTORY: Past Surgical History:  Procedure Laterality Date   BREAST SURGERY     Left breast lumpectomy sentinel node biopsy   CERVICAL SPINE SURGERY  05/23/2008   COLECTOMY  04/11/2008   left colectomy with end colostomy due to clot   COLOSTOMY  [place]  08/14/2008   EYE SURGERY     bil cataracts   HERNIA REPAIR  2011   [facility]   INTRAVASCULAR PRESSURE WIRE/FFR STUDY N/A 01/25/2018   Procedure: INTRAVASCULAR PRESSURE WIRE/FFR STUDY;  Surgeon: Nelva Bush, MD;  Location: [facility];  Service: Cardiovascular;  Laterality: N/A;   LEFT HEART CATH AND CORONARY ANGIOGRAPHY N/A 01/25/2018   Procedure: LEFT HEART CATH AND CORONARY ANGIOGRAPHY;  Surgeon: Nelva Bush, MD;  Location: [facility];  Service: Cardiovascular;  Laterality: N/A;   LUMBAR DISC SURGERY  2006   TOTAL HIP ARTHROPLASTY Left 10/07/2016   Procedure: LEFT TOTAL HIP ARTHROPLASTY ANTERIOR APPROACH;  Surgeon: Paralee Cancel, MD;  Location: [facility];  Service: Orthopedics;  Laterality: Left;  70 mins    SOCIAL HISTORY: Social History   Socioeconomic History   Marital status: Married    Spouse name: Not on file   Number of children: Not on file   Years of education: Not on file   Highest education level: Not on file  Occupational History   Not on file  Social Needs   Financial resource strain: Not on file   Food insecurity:    Worry: Not on file    Inability: Not on file   Transportation needs:    Medical: Not on file    Non-medical: Not on file  Tobacco Use   Smoking status: Former Smoker    Packs/day: 0.50    Years: 6.00    Pack years: 3.00    Last attempt to quit: 01/06/1964    Years since quitting: 54.4   Smokeless tobacco: Never Used  Substance and Sexual Activity   Alcohol use: Yes    Alcohol/week: 0.0 standard drinks    Comment: Rare   Drug use: No   Sexual activity: Not Currently    Partners: Male    Comment: 1st intercourse 78 yo-Fewer than 5 partners  Lifestyle   Physical activity:    Days per week: Not on file    Minutes per session: Not on file   Stress: Not on file  Relationships   Social connections:    Talks on phone: Not on file    Gets together: Not on file    Attends religious service: Not  on file    Active member of club or organization: Not on file    Attends meetings of clubs or organizations: Not on file    Relationship status: Not on file   Intimate partner violence:    Fear of current or ex partner: Not on file    Emotionally abused: Not on file    Physically abused: Not on file    Forced sexual activity: Not on file  Other Topics Concern   Not on file  Social History Narrative   Not on file    FAMILY HISTORY: Family History  Problem Relation Age of Onset   Breast cancer Mother 25   Lung cancer Mother    Heart disease Father    Basal cell carcinoma  Father        x2   CAD Father    Ovarian cysts Sister    Dementia Sister    Breast cancer Maternal Aunt 65   Breast cancer Paternal Aunt 21   Ovarian cysts Maternal Grandmother        possibly had ovarian cancer as well   Breast cancer Maternal Aunt 55   Cancer Other        either pancreatic or colon   Other Daughter 88       breast lumpectomy- complex sclerosing lesion   Polycystic ovary syndrome Daughter    Bladder Cancer Cousin     ALLERGIES:  is allergic to lipitor [atorvastatin calcium]; penicillins; sulfa antibiotics; and tamiflu [oseltamivir phosphate].  MEDICATIONS:  Current Outpatient Medications  Medication Sig Dispense Refill   albuterol (PROVENTIL HFA;VENTOLIN HFA) 108 (90 Base) MCG/ACT inhaler Inhale 2 puffs into the lungs every 6 (six) hours as needed for wheezing or shortness of breath. 1 Inhaler 2   aspirin 81 MG tablet Take 81 mg by mouth daily.       Cholecalciferol (VITAMIN D) 2000 units CAPS Take 2,000 Units by mouth daily.     docusate sodium (COLACE) 100 MG capsule Take 1 capsule (100 mg total) by mouth 2 (two) times daily. (Patient taking differently: Take 100 mg by mouth daily. ) 10 capsule 0   esomeprazole (NEXIUM) 40 MG capsule Take 40 mg by mouth daily.      famotidine (PEPCID) 20 MG tablet Take 20 mg by mouth 2 (two) times daily.     folic acid  (FOLVITE) 1 MG tablet Take 1 mg by mouth daily.     hydroxyurea (HYDREA) 500 MG capsule Take 1 capsule (500 mg total) by mouth daily. May take with food to minimize GI side effects. 90 capsule 1   hyoscyamine (LEVSIN SL) 0.125 MG SL tablet Place 0.125 mg under the tongue daily as needed.     nitroGLYCERIN (NITROSTAT) 0.4 MG SL tablet One tablet under tongue every 5 minutes. If you are still having discomfort after 3 tablets in 15 minutes, call 911. 25 tablet 5   ondansetron (ZOFRAN) 4 MG tablet Take 1 tablet (4 mg total) by mouth every 6 (six) hours as needed for nausea. 30 tablet 0   polyethylene glycol (MIRALAX / GLYCOLAX) packet Take 17 g by mouth 2 (two) times daily. (Patient taking differently: Take 17 g by mouth daily. ) 14 each 0   rivaroxaban (XARELTO) 20 MG TABS tablet Take 1 tablet (20 mg total) by mouth daily with supper. 90 tablet 3   rosuvastatin (CRESTOR) 20 MG tablet Take 1 tablet (20 mg total) by mouth daily. 90 tablet 1   Spacer/Aero-Holding Chambers DEVI 1 Device by Does not apply route as directed. 1 each 0   triamterene-hydrochlorothiazide (DYAZIDE) 37.5-25 MG capsule Take 1 capsule by mouth daily.     No current facility-administered medications for this visit.     REVIEW OF SYSTEMS:    10 Point review of Systems was done is negative except as noted above.  PHYSICAL EXAMINATION:  . Vitals:   06/22/18 1025  BP: 137/89  Pulse: (!) 110  Resp: 20  SpO2: 98%   Filed Weights   06/22/18 1025  Weight: 99 lb 3.2 oz (45 kg)   .Body mass index is 18.74 kg/m.  GENERAL:alert, in no acute distress and comfortable SKIN: no acute rashes, no significant lesions EYES: conjunctiva are pink and non-injected, sclera anicteric OROPHARYNX: MMM, no  exudates, no oropharyngeal erythema or ulceration NECK: supple, no JVD LYMPH:  no palpable lymphadenopathy in the cervical, axillary or inguinal regions LUNGS: clear to auscultation b/l with normal respiratory effort HEART:  regular rate & rhythm ABDOMEN:  normoactive bowel sounds , non tender, not distended. Extremity: no pedal edema PSYCH: alert & oriented x 3 with fluent speech NEURO: no focal motor/sensory deficits  LABORATORY DATA:  I have reviewed the data as listed  . CBC Latest Ref Rng & Units 06/22/2018 06/02/2018 05/03/2018  WBC 4.0 - 10.5 K/uL 6.4 6.7 6.2  Hemoglobin 12.0 - 15.0 g/dL 12.3 12.4 12.6  Hematocrit 36.0 - 46.0 % 40.1 41.5 42.5  Platelets 150 - 400 K/uL 384 325 400    . CMP Latest Ref Rng & Units 06/22/2018 05/03/2018 04/19/2018  Glucose 70 - 99 mg/dL 71 97 81  BUN 8 - 23 mg/dL 30(H) 25(H) 37(H)  Creatinine 0.44 - 1.00 mg/dL 1.23(H) 1.32(H) 1.42(H)  Sodium 135 - 145 mmol/L 141 141 143  Potassium 3.5 - 5.1 mmol/L 3.3(L) 3.6 3.9  Chloride 98 - 111 mmol/L 104 103 102  CO2 22 - 32 mmol/L '25 27 23  '$ Calcium 8.9 - 10.3 mg/dL 9.3 9.9 9.3  Total Protein 6.5 - 8.1 g/dL 6.5 - -  Total Bilirubin 0.3 - 1.2 mg/dL 0.8 - -  Alkaline Phos 38 - 126 U/L 50 - -  AST 15 - 41 U/L 16 - -  ALT 0 - 44 U/L 13 - -   06/07/08 JAK2:    RADIOGRAPHIC STUDIES: I have personally reviewed the radiological images as listed and agreed with the findings in the report. US Abdomen Limited Ruq  Result Date: 06/14/2018 CLINICAL DATA:  78 year old female with epigastric pain for 5-6 months. EXAM: ULTRASOUND ABDOMEN LIMITED RIGHT UPPER QUADRANT COMPARISON:  11/20/2016 CT. FINDINGS: Gallbladder: 8 mm nonmobile echogenic structure suggestive of a polyp. No gallbladder wall thickening. Patient not tender over the gallbladder during scanning. Common bile duct: Diameter: 3.4 mm. Liver: Slightly heterogeneous echotexture. No worrisome focal hepatic lesion. Portal vein is patent on color Doppler imaging with normal direction of blood flow towards the liver. IMPRESSION: 1. 8 mm gallbladder polyp. Polyps of this size are best evaluated with yearly/annual ultrasound. 2. Slightly heterogeneous liver echotexture without worrisome liver  lesion noted. Electronically Signed   By: Genia Del M.D.   On: 06/14/2018 13:05    ASSESSMENT & PLAN:  78 y.o. female with  1. 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  2. 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 S/p Lumpectomy, Radiation, 4 cycles of Cytoxan and Taxotere. Completed maintenance Arimidex until Fall 2019 Continues with annual mammograms with OBGYN Dr. Donalynn Furlong.  PLAN: -Discussed patient's most recent labs from 06/02/18, WBC normal at 6.7k, HGB normal at 12.4, PLT normal at 325k -Goal for PLT between 200k and 400k -The pt has no prohibitive toxicities from continuing '500mg'$  Hydroxyurea every day, at this time. -continues to be on ASA '81mg'$  and Xarelto -Could try Amlodipine as opposed to Losartan to potentially help with esophageal spasms if GI believes this is the cause of her epigastric symptoms -Will see the pt back in 3 months with labs  RTC with Dr Irene Limbo with labs in 3 months   All of the patients questions were answered with apparent satisfaction. The patient knows to  call the clinic with any problems, questions or concerns.  The total time spent in the appt was 30 minutes and more than 50% was on counseling and direct patient cares.    Sullivan Lone MD MS AAHIVMS [facility] Hematology/Oncology Physician [facility]  (Office):       [phone] (Work cell):  [phone] (Fax):           [phone]  06/22/2018 11:23 AM  I, Baldwin [place], am acting as a scribe for Dr. Sullivan Lone.  Brunetta Genera MD

## 2018-06-22 ENCOUNTER — Inpatient Hospital Stay: Payer: Medicare Other

## 2018-06-22 ENCOUNTER — Other Ambulatory Visit: Payer: Self-pay

## 2018-06-22 ENCOUNTER — Inpatient Hospital Stay: Payer: Medicare Other | Attending: Hematology | Admitting: Hematology

## 2018-06-22 ENCOUNTER — Telehealth: Payer: Self-pay | Admitting: Hematology

## 2018-06-22 VITALS — BP 137/89 | HR 110 | Resp 20 | Ht 61.0 in | Wt 99.2 lb

## 2018-06-22 DIAGNOSIS — Z853 Personal history of malignant neoplasm of breast: Secondary | ICD-10-CM | POA: Diagnosis not present

## 2018-06-22 DIAGNOSIS — I10 Essential (primary) hypertension: Secondary | ICD-10-CM | POA: Diagnosis not present

## 2018-06-22 DIAGNOSIS — J45909 Unspecified asthma, uncomplicated: Secondary | ICD-10-CM | POA: Insufficient documentation

## 2018-06-22 DIAGNOSIS — K219 Gastro-esophageal reflux disease without esophagitis: Secondary | ICD-10-CM | POA: Insufficient documentation

## 2018-06-22 DIAGNOSIS — Z7982 Long term (current) use of aspirin: Secondary | ICD-10-CM | POA: Insufficient documentation

## 2018-06-22 DIAGNOSIS — Z79899 Other long term (current) drug therapy: Secondary | ICD-10-CM | POA: Insufficient documentation

## 2018-06-22 DIAGNOSIS — K55069 Acute infarction of intestine, part and extent unspecified: Secondary | ICD-10-CM | POA: Insufficient documentation

## 2018-06-22 DIAGNOSIS — Z803 Family history of malignant neoplasm of breast: Secondary | ICD-10-CM | POA: Diagnosis not present

## 2018-06-22 DIAGNOSIS — Z87891 Personal history of nicotine dependence: Secondary | ICD-10-CM

## 2018-06-22 DIAGNOSIS — M8580 Other specified disorders of bone density and structure, unspecified site: Secondary | ICD-10-CM | POA: Diagnosis not present

## 2018-06-22 DIAGNOSIS — D473 Essential (hemorrhagic) thrombocythemia: Secondary | ICD-10-CM | POA: Insufficient documentation

## 2018-06-22 LAB — CMP (CANCER CENTER ONLY)
ALT: 13 U/L (ref 0–44)
AST: 16 U/L (ref 15–41)
Albumin: 3.9 g/dL (ref 3.5–5.0)
Alkaline Phosphatase: 50 U/L (ref 38–126)
Anion gap: 12 (ref 5–15)
BUN: 30 mg/dL — ABNORMAL HIGH (ref 8–23)
CO2: 25 mmol/L (ref 22–32)
Calcium: 9.3 mg/dL (ref 8.9–10.3)
Chloride: 104 mmol/L (ref 98–111)
Creatinine: 1.23 mg/dL — ABNORMAL HIGH (ref 0.44–1.00)
GFR, Est AFR Am: 50 mL/min — ABNORMAL LOW (ref >60)
GFR, Estimated: 43 mL/min — ABNORMAL LOW (ref >60)
Glucose, Bld: 71 mg/dL (ref 70–99)
Potassium: 3.3 mmol/L — ABNORMAL LOW (ref 3.5–5.1)
Sodium: 141 mmol/L (ref 135–145)
Total Bilirubin: 0.8 mg/dL (ref 0.3–1.2)
Total Protein: 6.5 g/dL (ref 6.5–8.1)

## 2018-06-22 LAB — CBC WITH DIFFERENTIAL (CANCER CENTER ONLY)
Abs Immature Granulocytes: 0.07 10*3/uL (ref 0.00–0.07)
Basophils Absolute: 0.1 10*3/uL (ref 0.0–0.1)
Basophils Relative: 1 %
Eosinophils Absolute: 0.1 10*3/uL (ref 0.0–0.5)
Eosinophils Relative: 1 %
HCT: 40.1 % (ref 36.0–46.0)
Hemoglobin: 12.3 g/dL (ref 12.0–15.0)
Immature Granulocytes: 1 %
Lymphocytes Relative: 14 %
Lymphs Abs: 0.9 10*3/uL (ref 0.7–4.0)
MCH: 28 pg (ref 26.0–34.0)
MCHC: 30.7 g/dL (ref 30.0–36.0)
MCV: 91.1 fL (ref 80.0–100.0)
Monocytes Absolute: 0.4 10*3/uL (ref 0.1–1.0)
Monocytes Relative: 6 %
Neutro Abs: 5 10*3/uL (ref 1.7–7.7)
Neutrophils Relative %: 77 %
Platelet Count: 384 10*3/uL (ref 150–400)
RBC: 4.4 MIL/uL (ref 3.87–5.11)
RDW: 25.2 % — ABNORMAL HIGH (ref 11.5–15.5)
WBC Count: 6.4 10*3/uL (ref 4.0–10.5)
nRBC: 0 % (ref 0.0–0.2)

## 2018-06-22 NOTE — Telephone Encounter (Signed)
Scheduled appt per 3/24 los. ° °Printed calendar and avs. °

## 2018-06-25 DIAGNOSIS — I129 Hypertensive chronic kidney disease with stage 1 through stage 4 chronic kidney disease, or unspecified chronic kidney disease: Secondary | ICD-10-CM | POA: Diagnosis not present

## 2018-06-25 DIAGNOSIS — I81 Portal vein thrombosis: Secondary | ICD-10-CM | POA: Diagnosis not present

## 2018-06-25 DIAGNOSIS — N183 Chronic kidney disease, stage 3 (moderate): Secondary | ICD-10-CM | POA: Diagnosis not present

## 2018-06-25 DIAGNOSIS — Z853 Personal history of malignant neoplasm of breast: Secondary | ICD-10-CM | POA: Diagnosis not present

## 2018-06-25 DIAGNOSIS — R251 Tremor, unspecified: Secondary | ICD-10-CM | POA: Diagnosis not present

## 2018-06-25 DIAGNOSIS — C946 Myelodysplastic disease, not classified: Secondary | ICD-10-CM | POA: Diagnosis not present

## 2018-06-25 DIAGNOSIS — K219 Gastro-esophageal reflux disease without esophagitis: Secondary | ICD-10-CM | POA: Diagnosis not present

## 2018-06-25 DIAGNOSIS — Z1389 Encounter for screening for other disorder: Secondary | ICD-10-CM | POA: Diagnosis not present

## 2018-06-25 DIAGNOSIS — M810 Age-related osteoporosis without current pathological fracture: Secondary | ICD-10-CM | POA: Diagnosis not present

## 2018-06-25 DIAGNOSIS — E7800 Pure hypercholesterolemia, unspecified: Secondary | ICD-10-CM | POA: Diagnosis not present

## 2018-06-25 DIAGNOSIS — Z0000 Encounter for general adult medical examination without abnormal findings: Secondary | ICD-10-CM | POA: Diagnosis not present

## 2018-06-25 DIAGNOSIS — J479 Bronchiectasis, uncomplicated: Secondary | ICD-10-CM | POA: Diagnosis not present

## 2018-07-06 ENCOUNTER — Telehealth: Payer: Self-pay | Admitting: *Deleted

## 2018-07-06 NOTE — Telephone Encounter (Signed)
Contacted patient regarding test results per Dr. Grier Mitts directions:Please inform patient of mildly low potassium levels. Recommend increased intake of potassium rich foods.  Patient verbalized understanding.

## 2018-07-12 ENCOUNTER — Ambulatory Visit: Payer: Medicare Other | Admitting: Pulmonary Disease

## 2018-07-28 ENCOUNTER — Telehealth: Payer: Self-pay | Admitting: *Deleted

## 2018-07-28 NOTE — Telephone Encounter (Signed)
07/28/18 LMOM @ 0905, re: follow up appointment.

## 2018-08-02 ENCOUNTER — Telehealth: Payer: Self-pay | Admitting: Cardiovascular Disease

## 2018-08-02 NOTE — Telephone Encounter (Signed)
New Message:    Pt said she is supposed to have lab work this week. He wants to cancel for another few weeks due to COVID-19. She wants to know if this is alright? She also wanted you to know she is doing great with her Crestor. Please call if this is alright.

## 2018-08-03 NOTE — Telephone Encounter (Signed)
CMET done 06/22/18, Crestor started 5/19 Advised ok to wait until June to have labs checked

## 2018-08-03 NOTE — Telephone Encounter (Signed)
Glad to hear it.  OK to wait until she feels comfortable.

## 2018-08-24 DIAGNOSIS — L821 Other seborrheic keratosis: Secondary | ICD-10-CM | POA: Diagnosis not present

## 2018-08-24 DIAGNOSIS — L814 Other melanin hyperpigmentation: Secondary | ICD-10-CM | POA: Diagnosis not present

## 2018-08-24 DIAGNOSIS — D485 Neoplasm of uncertain behavior of skin: Secondary | ICD-10-CM | POA: Diagnosis not present

## 2018-08-24 DIAGNOSIS — D044 Carcinoma in situ of skin of scalp and neck: Secondary | ICD-10-CM | POA: Diagnosis not present

## 2018-08-24 DIAGNOSIS — Z85828 Personal history of other malignant neoplasm of skin: Secondary | ICD-10-CM | POA: Diagnosis not present

## 2018-09-06 DIAGNOSIS — E7800 Pure hypercholesterolemia, unspecified: Secondary | ICD-10-CM | POA: Diagnosis not present

## 2018-09-06 LAB — COMPREHENSIVE METABOLIC PANEL
ALT: 10 IU/L (ref 0–32)
AST: 14 IU/L (ref 0–40)
Albumin/Globulin Ratio: 2.4 — ABNORMAL HIGH (ref 1.2–2.2)
Albumin: 4.1 g/dL (ref 3.7–4.7)
Alkaline Phosphatase: 47 IU/L (ref 39–117)
BUN/Creatinine Ratio: 26 (ref 12–28)
BUN: 27 mg/dL (ref 8–27)
Bilirubin Total: 0.5 mg/dL (ref 0.0–1.2)
CO2: 23 mmol/L (ref 20–29)
Calcium: 9.6 mg/dL (ref 8.7–10.3)
Chloride: 103 mmol/L (ref 96–106)
Creatinine, Ser: 1.02 mg/dL — ABNORMAL HIGH (ref 0.57–1.00)
GFR calc Af Amer: 62 mL/min/{1.73_m2} (ref >59)
GFR calc non Af Amer: 54 mL/min/{1.73_m2} — ABNORMAL LOW (ref >59)
Globulin, Total: 1.7 g/dL (ref 1.5–4.5)
Glucose: 67 mg/dL (ref 65–99)
Potassium: 3.6 mmol/L (ref 3.5–5.2)
Sodium: 141 mmol/L (ref 134–144)
Total Protein: 5.8 g/dL — ABNORMAL LOW (ref 6.0–8.5)

## 2018-09-06 LAB — LIPID PANEL
Chol/HDL Ratio: 2.6 ratio (ref 0.0–4.4)
Cholesterol, Total: 160 mg/dL (ref 100–199)
HDL: 62 mg/dL (ref >39)
LDL Calculated: 73 mg/dL (ref 0–99)
Triglycerides: 126 mg/dL (ref 0–149)
VLDL Cholesterol Cal: 25 mg/dL (ref 5–40)

## 2018-09-21 DIAGNOSIS — D044 Carcinoma in situ of skin of scalp and neck: Secondary | ICD-10-CM | POA: Diagnosis not present

## 2018-09-21 NOTE — Progress Notes (Signed)
HEMATOLOGY/ONCOLOGY CONSULTATION NOTE  Date of Service: 09/22/2018  Patient Care Team: Gaynelle Arabian, MD as PCP - General (Family Medicine) Skeet Latch, MD as PCP - Cardiology (Cardiology) Annia Belt, MD as Consulting Physician (Oncology) Rolm Bookbinder, MD as Consulting Physician (General Surgery)  CHIEF COMPLAINTS/PURPOSE OF CONSULTATION:  Essential Thrombocytosis  HISTORY OF PRESENTING ILLNESS:   Gwendolyn Williams is a wonderful 78 y.o. female who has been referred to [place] by Dr. Gaynelle Arabian, and was previously under the care of Dr. 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 The pt reports that she is doing well overall.   The pt reports that she has seen Dr. Michail Sermon in GI and is concerned for esophageal spasm but does not wish to proceed with the diagnostic work up with esophageal manometry. She notes that muscle relaxants help with this occasional pain. She also follows up with Dr. Donne Hazel in surgery to determine if this related to her previous abdominal surgeries. 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  The pt notes that she does not have any other present concerns. She notes that she had SOB at the end of last year, as part of bronchiectasis, and takes Memory Dance once a day, denies needing her rescue inhaler. She denies positional elements to her SOB.  The pt notes that she was off Hyxdroxyurea during the Fall of 2019 during this work up. She then returned to 536m Hydoxyurea once a day and her PLT have normalized. She takes this in the morning with her food and denies any problems tolerating this medication.   The pt denies any concerns for bleeding at this time.  The pt continues with annual mammograms with her OBGYN Dr. TDonalynn Furlong The pt pursued maintenance Arimidex for 8.5 years, and stopped during the  evaluation of her respiratory symptoms in Fall 2019. The pt completes a bone density study every year, and notes she has stable osteopenia. She takes Vitamin D replacement and notes her levels are good.  She continues on 257mXarelto. She continues on 8159mspirin as well.  The pt notes that she has had chronic mouth ulcers for "decades." She has tried Vitamin B complex and mouthwashes. She notes that she routinely gets mouth ulcers after her dental cleanings as well.  Most recent lab results (06/22/18) of CBC w/diff and CMP is as follows: all values are WNL except for RDW at 25.2, Potassium at 3.3, BUN at 30, Creatinine at 1.23, GFR at 43.  On review of systems, pt reports intermittent esophageal discomfort, stable breathing, good energy levels, some abdominal tenderness, chronic mouth ulcers, and denies concerns for bleeding, and any other symptoms.  Interval History:   Gwendolyn BUEHRLEturns today for management and evaluation of her Essential Thrombocytosis. The patient's last visit with us [place] on 06/22/18. The pt reports that she is doing well overall.  The pt reports that she has not developed any new concerns in the interim. She notes that she continues to have intermittent esophageal spasms and has followed up with GI regarding this but has not wanted to pursue monometry. She also continues on Breo and endorses stable breathing. She denies leg swelling or skin ulcers. She notes that she has had mouth sores for decades and denies any new or different kinds of mouth sores.   The pt notes that she has been off Losartan  for a couple months now, and continues on Dyazide.  Lab results today (09/22/18) of CBC w/diff and CMP is as follows: all values are WNL except for Glucose at 65, GFR at 56. 09/22/18 LDH at 267  On review of systems, pt reports stable energy levels, and denies different mouth sores, skin rashes, diarrhea, leg swelling, calf pains, abdominal pains, and any other symptoms.    MEDICAL HISTORY:  Past Medical History:  Diagnosis Date  . Arthritis   . Asthma    controlled with singulair  . Breast cancer ([place])    stage I left breast  . Current use of long term anticoagulation 04/21/2017  . Dyspnea, unspecified 02/24/2017   Started coincident with Hydrea, exertional and orthostatic, normal exam. O2 sat 100%.  . Family history of breast cancer   . GERD (gastroesophageal reflux disease)   . H/O blood clots    clotting disorder since 2010  . Hematoma of arm 08/09/2012  . 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 Hyperlipidemia   . Hypertension   . 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 Pneumonia    viral pneumonia as a kid  . PONV (postoperative nausea and vomiting)     SURGICAL HISTORY: Past Surgical History:  Procedure Laterality Date  . BREAST SURGERY     Left breast lumpectomy sentinel node biopsy  . CERVICAL SPINE SURGERY  05/23/2008  . COLECTOMY  04/11/2008   left colectomy with end colostomy due to clot  . COLOSTOMY TAKEDOWN  08/14/2008  . EYE SURGERY     bil cataracts  . HERNIA REPAIR  2011   [facility]  . INTRAVASCULAR PRESSURE WIRE/FFR STUDY N/A 01/25/2018   Procedure: INTRAVASCULAR PRESSURE WIRE/FFR STUDY;  Surgeon: Nelva Bush, MD;  Location: [facility];  Service: Cardiovascular;  Laterality: N/A;  . LEFT HEART CATH AND CORONARY ANGIOGRAPHY N/A 01/25/2018   Procedure: LEFT HEART CATH AND CORONARY ANGIOGRAPHY;  Surgeon: Nelva Bush, MD;  Location: [facility];  Service: Cardiovascular;  Laterality: N/A;  . LUMBAR [place] SURGERY  2006  . TOTAL HIP ARTHROPLASTY Left 10/07/2016   Procedure: LEFT TOTAL HIP ARTHROPLASTY ANTERIOR APPROACH;  Surgeon: Paralee Cancel, MD;  Location: [facility];  Service: Orthopedics;  Laterality: Left;  70 mins    SOCIAL HISTORY: Social History   Socioeconomic History  . Marital status: Married    Spouse name: Not on  file  . Number of children: Not on file  . Years of education: Not on file  . Highest education level: Not on file  Occupational History  . Not on file  Social Needs  . Financial resource strain: Not on file  . Food insecurity    Worry: Not on file    Inability: Not on file  . Transportation needs    Medical: Not on file    Non-medical: Not on file  Tobacco Use  . Smoking status: Former Smoker    Packs/day: 0.50    Years: 6.00    Pack years: 3.00    Quit date: 01/06/1964    Years since quitting: 54.7  . Smokeless tobacco: Never Used  Substance and Sexual Activity  . Alcohol use: Yes    Alcohol/week: 0.0 standard drinks    Comment: Rare  . Drug use: No  . Sexual activity: Not Currently    Partners: Male    Comment: 1st intercourse 78 yo-Fewer than 5 partners  Lifestyle  . Physical activity    Days per week: Not on file    Minutes per session: Not on file  . Stress: Not on file  Relationships  . Social Herbalist on phone: Not on file    Gets together: Not on file    Attends religious service: Not on file    Active member of club or organization: Not on file    Attends meetings of clubs or organizations: Not on file    Relationship status: Not on file  . Intimate partner violence    Fear of current or ex partner: Not on file    Emotionally abused: Not on file    Physically abused: Not on file    Forced sexual activity: Not on file  Other Topics Concern  . Not on file  Social History Narrative  . Not on file    FAMILY HISTORY: Family History  Problem Relation Age of Onset  . Breast cancer Mother 37  . Lung cancer Mother   . Heart disease Father   . Basal cell carcinoma Father        x2  . CAD Father   . Ovarian cysts Sister   . Dementia Sister   . Breast cancer Maternal Aunt 78  . Breast cancer Paternal Aunt 50  . Ovarian cysts Maternal Grandmother        possibly had ovarian cancer as well  . Breast cancer Maternal Aunt 22  . Cancer Other         either pancreatic or colon  . Other Daughter 82       breast lumpectomy- complex sclerosing lesion  . Polycystic ovary syndrome Daughter   . Bladder Cancer Cousin     ALLERGIES:  is allergic to lipitor [atorvastatin calcium]; penicillins; sulfa antibiotics; and tamiflu [oseltamivir phosphate].  MEDICATIONS:  Current Outpatient Medications  Medication Sig Dispense Refill  . albuterol (PROVENTIL HFA;VENTOLIN HFA) 108 (90 Base) MCG/ACT inhaler Inhale 2 puffs into the lungs every 6 (six) hours as needed for wheezing or shortness of breath. 1 Inhaler 2  . aspirin 81 MG tablet Take 81 mg by mouth daily.      . Cholecalciferol (VITAMIN D) 2000 units CAPS Take 2,000 Units by mouth daily.    Marland Kitchen docusate sodium (COLACE) 100 MG capsule Take 1 capsule (100 mg total) by mouth 2 (two) times daily. (Patient taking differently: Take 100 mg by mouth daily. ) 10 capsule 0  . esomeprazole (NEXIUM) 40 MG capsule Take 40 mg by mouth daily.     . famotidine (PEPCID) 20 MG tablet Take 20 mg by mouth 2 (two) times daily.    . folic acid (FOLVITE) 1 MG tablet Take 1 mg by mouth daily.    . hydroxyurea (HYDREA) 500 MG capsule Take 1 capsule (500 mg total) by mouth daily. May take with food to minimize GI side effects. 90 capsule 1  . hyoscyamine (LEVSIN SL) 0.125 MG SL tablet Place 0.125 mg under the tongue daily as needed.    . nitroGLYCERIN (NITROSTAT) 0.4 MG SL tablet One tablet under tongue every 5 minutes. If you are still having discomfort after 3 tablets in 15 minutes, call 911. 25 tablet 5  . ondansetron (ZOFRAN) 4 MG tablet Take 1 tablet (4 mg total) by mouth every 6 (six) hours as needed for nausea. 30 tablet 0  . polyethylene glycol (MIRALAX / GLYCOLAX) packet Take 17 g by mouth 2 (two) times daily. (Patient taking  differently: Take 17 g by mouth daily. ) 14 each 0  . rivaroxaban (XARELTO) 20 MG TABS tablet Take 1 tablet (20 mg total) by mouth daily with supper. 90 tablet 3  . rosuvastatin (CRESTOR)  20 MG tablet Take 1 tablet (20 mg total) by mouth daily. 90 tablet 1  . Spacer/Aero-Holding Chambers DEVI 1 Device by Does not apply route as directed. 1 each 0  . triamterene-hydrochlorothiazide (DYAZIDE) 37.5-25 MG capsule Take 1 capsule by mouth daily.     No current facility-administered medications for this visit.     REVIEW OF SYSTEMS:    A 10+ POINT REVIEW OF SYSTEMS WAS OBTAINED including neurology, dermatology, psychiatry, cardiac, respiratory, lymph, extremities, GI, GU, Musculoskeletal, constitutional, breasts, reproductive, HEENT.  All pertinent positives are noted in the HPI.  All others are negative.   PHYSICAL EXAMINATION:  . Vitals:   09/22/18 1043  BP: (!) 149/100  Pulse: 78  Resp: 18  Temp: 98.3 F (36.8 C)  SpO2: 100%   Filed Weights   09/22/18 1043  Weight: 96 lb 14.4 oz (44 kg)   .Body mass index is 18.31 kg/m.  GENERAL:alert, in no acute distress and comfortable SKIN: no acute rashes, no significant lesions EYES: conjunctiva are pink and non-injected, sclera anicteric OROPHARYNX: MMM, no exudates, no oropharyngeal erythema or ulceration NECK: supple, no JVD LYMPH:  no palpable lymphadenopathy in the cervical, axillary or inguinal regions LUNGS: clear to auscultation b/l with normal respiratory effort HEART: regular rate & rhythm ABDOMEN:  normoactive bowel sounds , non tender, not distended. No palpable hepatosplenomegaly.  Extremity: no pedal edema PSYCH: alert & oriented x 3 with fluent speech NEURO: no focal motor/sensory deficits   LABORATORY DATA:  I have reviewed the data as listed  . CBC Latest Ref Rng & Units 09/22/2018 06/22/2018 06/02/2018  WBC 4.0 - 10.5 K/uL 6.5 6.4 6.7  Hemoglobin 12.0 - 15.0 g/dL 12.5 12.3 12.4  Hematocrit 36.0 - 46.0 % 39.7 40.1 41.5  Platelets 150 - 400 K/uL 377 384 325    . CMP Latest Ref Rng & Units 09/22/2018 09/06/2018 06/22/2018  Glucose 70 - 99 mg/dL 65(L) 67 71  BUN 8 - 23 mg/dL 22 27 30(H)  Creatinine  0.44 - 1.00 mg/dL 0.99 1.02(H) 1.23(H)  Sodium 135 - 145 mmol/L 143 141 141  Potassium 3.5 - 5.1 mmol/L 3.8 3.6 3.3(L)  Chloride 98 - 111 mmol/L 103 103 104  CO2 22 - 32 mmol/L _0 Calcium 8.9 - 10.3 mg/dL 9.7 9.6 9.3  Total Protein 6.5 - 8.1 g/dL 6.9 5.8(L) 6.5  Total Bilirubin 0.3 - 1.2 mg/dL 0.6 0.5 0.8  Alkaline Phos 38 - 126 U/L 51 47 50  AST 15 - 41 U/L _1 ALT 0 - 44 U/L _2 06/07/08 JAK2:    RADIOGRAPHIC STUDIES: I have personally reviewed the radiological images as listed and agreed with the findings in the report. No results found.  ASSESSMENT & PLAN:  78 y.o. female with  1. 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  2. 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 S/p Lumpectomy, Radiation, 4 cycles of Cytoxan and Taxotere. Completed maintenance Arimidex until Fall 2019 Continues with annual mammograms with OBGYN Dr. TimDonalynn FurlongPLAN: -Discussed pt labwork today, 09/22/18; blood counts normal  including PLT at 377k. Chemistries stable.  -Recommend checking BP first thing in the morning for baseline -The pt has no prohibitive toxicities from continuing 5102m Hydroxyurea daily, at this time. -Goal for PLT between 200k and 400k -Continues to be on ASA 814mand Xarelto -Could try Amlodipine as opposed to Losartan to potentially help with esophageal spasms if GI believes this is the cause of her epigastric symptoms -Will see the pt back in 3 months   RTC with Dr KaIrene Limboith labs in 3 months   All of the patients questions were answered with apparent satisfaction. The patient knows to call the clinic with any problems, questions or concerns.  The total time spent in the appt was 20 minutes and more than 50% was on counseling and direct patient  cares.    GaSullivan LoneD MS AAHIVMS SCEast [facility] Hospital - GracewoodTRehabilitation Institute Of [facility] - D[facility] Shirley Ryan Abilitylabematology/Oncology Physician CoOrthocare Surgery [facility] LLC(Office):       [phone] cell):  [phone]):           [phone]/24/2020 11:44 AM  I, ScBaldwin Jamaicaam acting as a scribe for Dr. GaSullivan Lone  .I have reviewed the above documentation for accuracy and completeness, and I agree with the above. .GBrunetta GeneraD

## 2018-09-22 ENCOUNTER — Inpatient Hospital Stay (HOSPITAL_BASED_OUTPATIENT_CLINIC_OR_DEPARTMENT_OTHER): Payer: Medicare Other | Admitting: Hematology

## 2018-09-22 ENCOUNTER — Inpatient Hospital Stay: Payer: Medicare Other | Attending: Hematology

## 2018-09-22 ENCOUNTER — Telehealth: Payer: Self-pay | Admitting: Hematology

## 2018-09-22 ENCOUNTER — Other Ambulatory Visit: Payer: Self-pay

## 2018-09-22 VITALS — BP 149/100 | HR 78 | Temp 98.3°F | Resp 18 | Ht 61.0 in | Wt 96.9 lb

## 2018-09-22 DIAGNOSIS — K55069 Acute infarction of intestine, part and extent unspecified: Secondary | ICD-10-CM

## 2018-09-22 DIAGNOSIS — Z79899 Other long term (current) drug therapy: Secondary | ICD-10-CM | POA: Insufficient documentation

## 2018-09-22 DIAGNOSIS — Z87891 Personal history of nicotine dependence: Secondary | ICD-10-CM | POA: Insufficient documentation

## 2018-09-22 DIAGNOSIS — Z853 Personal history of malignant neoplasm of breast: Secondary | ICD-10-CM | POA: Diagnosis not present

## 2018-09-22 DIAGNOSIS — M810 Age-related osteoporosis without current pathological fracture: Secondary | ICD-10-CM | POA: Insufficient documentation

## 2018-09-22 DIAGNOSIS — I10 Essential (primary) hypertension: Secondary | ICD-10-CM | POA: Insufficient documentation

## 2018-09-22 DIAGNOSIS — K219 Gastro-esophageal reflux disease without esophagitis: Secondary | ICD-10-CM | POA: Insufficient documentation

## 2018-09-22 DIAGNOSIS — Z7901 Long term (current) use of anticoagulants: Secondary | ICD-10-CM

## 2018-09-22 DIAGNOSIS — D473 Essential (hemorrhagic) thrombocythemia: Secondary | ICD-10-CM | POA: Diagnosis not present

## 2018-09-22 DIAGNOSIS — Z803 Family history of malignant neoplasm of breast: Secondary | ICD-10-CM | POA: Diagnosis not present

## 2018-09-22 DIAGNOSIS — Z7982 Long term (current) use of aspirin: Secondary | ICD-10-CM | POA: Insufficient documentation

## 2018-09-22 LAB — CBC WITH DIFFERENTIAL/PLATELET
Abs Immature Granulocytes: 0.04 10*3/uL (ref 0.00–0.07)
Basophils Absolute: 0.1 10*3/uL (ref 0.0–0.1)
Basophils Relative: 1 %
Eosinophils Absolute: 0.1 10*3/uL (ref 0.0–0.5)
Eosinophils Relative: 1 %
HCT: 39.7 % (ref 36.0–46.0)
Hemoglobin: 12.5 g/dL (ref 12.0–15.0)
Immature Granulocytes: 1 %
Lymphocytes Relative: 14 %
Lymphs Abs: 0.9 10*3/uL (ref 0.7–4.0)
MCH: 31.3 pg (ref 26.0–34.0)
MCHC: 31.5 g/dL (ref 30.0–36.0)
MCV: 99.5 fL (ref 80.0–100.0)
Monocytes Absolute: 0.3 10*3/uL (ref 0.1–1.0)
Monocytes Relative: 5 %
Neutro Abs: 5.1 10*3/uL (ref 1.7–7.7)
Neutrophils Relative %: 78 %
Platelets: 377 10*3/uL (ref 150–400)
RBC: 3.99 MIL/uL (ref 3.87–5.11)
RDW: 14.8 % (ref 11.5–15.5)
WBC: 6.5 10*3/uL (ref 4.0–10.5)
nRBC: 0 % (ref 0.0–0.2)

## 2018-09-22 LAB — CMP (CANCER CENTER ONLY)
ALT: 15 U/L (ref 0–44)
AST: 19 U/L (ref 15–41)
Albumin: 4 g/dL (ref 3.5–5.0)
Alkaline Phosphatase: 51 U/L (ref 38–126)
Anion gap: 11 (ref 5–15)
BUN: 22 mg/dL (ref 8–23)
CO2: 29 mmol/L (ref 22–32)
Calcium: 9.7 mg/dL (ref 8.9–10.3)
Chloride: 103 mmol/L (ref 98–111)
Creatinine: 0.99 mg/dL (ref 0.44–1.00)
GFR, Est AFR Am: >60 mL/min (ref >60)
GFR, Estimated: 56 mL/min — ABNORMAL LOW (ref >60)
Glucose, Bld: 65 mg/dL — ABNORMAL LOW (ref 70–99)
Potassium: 3.8 mmol/L (ref 3.5–5.1)
Sodium: 143 mmol/L (ref 135–145)
Total Bilirubin: 0.6 mg/dL (ref 0.3–1.2)
Total Protein: 6.9 g/dL (ref 6.5–8.1)

## 2018-09-22 LAB — LACTATE DEHYDROGENASE: LDH: 267 U/L — ABNORMAL HIGH (ref 98–192)

## 2018-09-22 NOTE — Telephone Encounter (Signed)
Scheduled appt per 6/24 los. Printed calendar and avs.

## 2018-09-27 ENCOUNTER — Other Ambulatory Visit: Payer: Self-pay

## 2018-09-27 ENCOUNTER — Encounter: Payer: Self-pay | Admitting: Hematology

## 2018-09-27 MED ORDER — HYDROXYUREA 500 MG PO CAPS: 500.0000 mg | ORAL_CAPSULE | Freq: Every day | ORAL | 1 refills | Status: DC

## 2018-10-04 DIAGNOSIS — Z20828 Contact with and (suspected) exposure to other viral communicable diseases: Secondary | ICD-10-CM | POA: Diagnosis not present

## 2018-11-01 DIAGNOSIS — I129 Hypertensive chronic kidney disease with stage 1 through stage 4 chronic kidney disease, or unspecified chronic kidney disease: Secondary | ICD-10-CM | POA: Diagnosis not present

## 2018-11-01 DIAGNOSIS — R0789 Other chest pain: Secondary | ICD-10-CM | POA: Diagnosis not present

## 2018-11-03 ENCOUNTER — Telehealth: Payer: Self-pay | Admitting: Gastroenterology

## 2018-11-03 ENCOUNTER — Encounter: Payer: Self-pay | Admitting: Gastroenterology

## 2018-11-03 NOTE — Telephone Encounter (Signed)
Pt scheduled on 09/2.

## 2018-11-03 NOTE — Telephone Encounter (Signed)
Hi Dr. Ardis Hughs, we have received a referral from patients's PCP for esophageal spasm. Patient has been seen Dr. Michail Sermon but would like to transfer care because Dr. Michail Sermon recommended manometry as next step in her treatment. However, patient states that she will not tolerate that exam so she is looking for alternatives treatment options for her diagnoses. Records from Dr. Michail Sermon and PCP will be placed on your desk for review. Please advise on scheduling. Thank you.

## 2018-11-03 NOTE — Telephone Encounter (Signed)
I am happy to see her.  Please offer her my first available new GI office visit, in person.  Not with an extender.  Thank you

## 2018-11-08 ENCOUNTER — Other Ambulatory Visit: Payer: Self-pay | Admitting: Cardiovascular Disease

## 2018-11-25 DIAGNOSIS — Z23 Encounter for immunization: Secondary | ICD-10-CM | POA: Diagnosis not present

## 2018-12-01 ENCOUNTER — Encounter: Payer: Self-pay | Admitting: Gastroenterology

## 2018-12-01 ENCOUNTER — Ambulatory Visit (INDEPENDENT_AMBULATORY_CARE_PROVIDER_SITE_OTHER): Payer: Medicare Other | Admitting: Gastroenterology

## 2018-12-01 ENCOUNTER — Other Ambulatory Visit: Payer: Self-pay

## 2018-12-01 VITALS — BP 142/82 | HR 78 | Temp 97.8°F | Ht 61.0 in | Wt 92.8 lb

## 2018-12-01 DIAGNOSIS — R1013 Epigastric pain: Secondary | ICD-10-CM

## 2018-12-01 NOTE — Progress Notes (Signed)
HPI: This is a very pleasant, talkative 78 year old woman who was referred to me by Gaynelle Arabian, MD  to evaluate epigastric pain.    Chief complaint is intermittent epigastric pain  She had similar pains that she is having now about 20 or 30 years ago.  She had extensive testing and nothing was found then.  The pains went away for at least a couple decades but this past November they restarted.  She has nearly daily sharp, epigastric pains it can last for minutes, sometimes up to 2 hours.  It is not associate with nausea.  The pains do sometimes radiate to her back.  There is no relation to eating moving her bowels, no relation to any time of day, swallowing does not causes pains.  Hyoscyamine often helps, this is an antispasmodic.  She believes she has lost about 10 pounds since January.  She takes Tylenol and only very rarely Aleve    Old Data Reviewed: Right upper quadrant ultrasound March 2020, indications "epigastric pain for 5 to 6 months".  Findings 8 mm gallbladder polyp.  Slightly heterogeneous liver echotexture without worrisome liver lesion noted.  EGD Dr. Michail Sermon 03/2018 indication "epigastric abdominal pain, unexplained chest pain".  Esophagus was normal the Z line was normal there was mild edema in the distal stomach and biopsies were taken.  She had a small hiatal hernia.  Pathology showed "chronic inactive gastritis.  Negative for H. Pylori"  CT scan abdomen pelvis without IV contrast August 2018 "small gallstone is noted"   Review of systems: Pertinent positive and negative review of systems were noted in the above HPI section. All other review negative.   Past Medical History:  Diagnosis Date  . Arthritis   . Asthma    controlled with singulair  . Breast cancer ([place])    stage I left breast  . Current use of long term anticoagulation 04/21/2017  . Dyspnea, unspecified 02/24/2017   Started coincident with Hydrea, exertional and orthostatic, normal exam. O2 sat  100%.  . Family history of breast cancer   . GERD (gastroesophageal reflux disease)   . H/O blood clots    clotting disorder since 2010  . Hematoma of arm 08/09/2012  . Hypercoagulable state, primary ([place])    JAK2 gene defect, thrombocythemia  . Hyperlipidemia   . Hypertension   . 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 Pneumonia    viral pneumonia as a kid  . PONV (postoperative nausea and vomiting)     Past Surgical History:  Procedure Laterality Date  . BREAST SURGERY     Left breast lumpectomy sentinel node biopsy  . CERVICAL SPINE SURGERY  05/23/2008  . COLECTOMY  04/11/2008   left colectomy with end colostomy due to clot  . COLOSTOMY TAKEDOWN  08/14/2008  . EYE SURGERY     bil cataracts  . HERNIA REPAIR  2011   [facility]  . INTRAVASCULAR PRESSURE WIRE/FFR STUDY N/A 01/25/2018   Procedure: INTRAVASCULAR PRESSURE WIRE/FFR STUDY;  Surgeon: Nelva Bush, MD;  Location: [facility];  Service: Cardiovascular;  Laterality: N/A;  . LEFT HEART CATH AND CORONARY ANGIOGRAPHY N/A 01/25/2018   Procedure: LEFT HEART CATH AND CORONARY ANGIOGRAPHY;  Surgeon: Nelva Bush, MD;  Location: [facility];  Service: Cardiovascular;  Laterality: N/A;  . LUMBAR [place] SURGERY  2006  . TOTAL HIP ARTHROPLASTY Left 10/07/2016   Procedure: LEFT TOTAL HIP ARTHROPLASTY ANTERIOR APPROACH;  Surgeon:  Paralee Cancel, MD;  [facility]: [facility];  Service: Orthopedics;  Laterality: Left;  70 mins    Current Outpatient Medications  Medication Sig Dispense Refill  . albuterol (PROVENTIL HFA;VENTOLIN HFA) 108 (90 Base) MCG/ACT inhaler Inhale 2 puffs into the lungs every 6 (six) hours as needed for wheezing or shortness of breath. 1 Inhaler 2  . amLODipine (NORVASC) 5 MG tablet Take 5 mg by mouth daily.    Marland Kitchen aspirin 81 MG tablet Take 81 mg by mouth daily.      . Cholecalciferol (VITAMIN D) 2000 units CAPS Take 2,000 Units by mouth daily.    Marland Kitchen docusate  sodium (COLACE) 100 MG capsule Take 1 capsule (100 mg total) by mouth 2 (two) times daily. (Patient taking differently: Take 100 mg by mouth daily. ) 10 capsule 0  . Fluticasone Furoate-Vilanterol (BREO ELLIPTA IN) Inhale into the lungs daily.    . folic acid (FOLVITE) 1 MG tablet Take 1 mg by mouth daily.    . hydroxyurea (HYDREA) 500 MG capsule Take 1 capsule (500 mg total) by mouth daily. May take with food to minimize GI side effects. 90 capsule 1  . hyoscyamine (LEVSIN SL) 0.125 MG SL tablet Place 0.125 mg under the tongue daily as needed. Takes 2 tablets    . ondansetron (ZOFRAN) 4 MG tablet Take 1 tablet (4 mg total) by mouth every 6 (six) hours as needed for nausea. 30 tablet 0  . polyethylene glycol (MIRALAX / GLYCOLAX) packet Take 17 g by mouth 2 (two) times daily. (Patient taking differently: Take 17 g by mouth daily. ) 14 each 0  . rivaroxaban (XARELTO) 20 MG TABS tablet Take 1 tablet (20 mg total) by mouth daily with supper. 90 tablet 3  . rosuvastatin (CRESTOR) 20 MG tablet TAKE ONE TABLET BY MOUTH DAILY 90 tablet 2  . Spacer/Aero-Holding Chambers DEVI 1 Device by Does not apply route as directed. 1 each 0  . triamterene-hydrochlorothiazide (DYAZIDE) 37.5-25 MG capsule Take 1 capsule by mouth daily.     No current facility-administered medications for this visit.     Allergies as of 12/01/2018 - Review Complete 12/01/2018  Allergen Reaction Noted  . Lipitor [atorvastatin calcium]  05/28/2018  . Penicillins Hives 08/30/2010  . Sulfa antibiotics Other (See Comments) 08/04/2011  . Tamiflu [oseltamivir phosphate] Nausea And Vomiting 04/23/2018    Family History  Problem Relation Age of Onset  . Breast cancer Mother 27  . Lung cancer Mother   . Heart disease Father   . Basal cell carcinoma Father        x2  . CAD Father   . Ovarian cysts Sister   . Dementia Sister   . Breast cancer Maternal Aunt 50  . Breast cancer Paternal Aunt 23  . Ovarian cysts Maternal Grandmother         possibly had ovarian cancer as well  . Breast cancer Maternal Aunt 50  . Cancer Other        either pancreatic or colon  . Other Daughter 32       breast lumpectomy- complex sclerosing lesion  . Polycystic ovary syndrome Daughter   . Bladder Cancer Cousin     Social History   Socioeconomic History  . Marital status: Married    Spouse name: Not on file  . Number of children: Not on file  . Years of education: Not on file  . Highest education level: Not on file  Occupational History  . Not on file  Social Needs  . Financial resource strain: Not on file  . Food insecurity    Worry: Not on file    Inability: Not on file  . Transportation needs    Medical: Not on file    Non-medical: Not on file  Tobacco Use  . Smoking status: Former Smoker    Packs/day: 0.50    Years: 6.00    Pack years: 3.00    Quit date: 01/06/1964    Years since quitting: 54.9  . Smokeless tobacco: Never Used  Substance and Sexual Activity  . Alcohol use: Yes    Alcohol/week: 0.0 standard drinks    Comment: Rare  . Drug use: No  . Sexual activity: Not Currently    Partners: Male    Comment: 1st intercourse 78 yo-Fewer than 5 partners  Lifestyle  . Physical activity    Days per week: Not on file    Minutes per session: Not on file  . Stress: Not on file  Relationships  . Social Herbalist on phone: Not on file    Gets together: Not on file    Attends religious service: Not on file    Active member of club or organization: Not on file    Attends meetings of clubs or organizations: Not on file    Relationship status: Not on file  . Intimate partner violence    Fear of current or ex partner: Not on file    Emotionally abused: Not on file    Physically abused: Not on file    Forced sexual activity: Not on file  Other Topics Concern  . Not on file  Social History Narrative  . Not on file     Physical Exam: BP (!) 142/82   Pulse 78   Temp 97.8 F (36.6 C) (Temporal)   Ht  5\' 1"  (1.549 m)   Wt 92 lb 12.8 oz (42.1 kg)   BMI 17.53 kg/m  Constitutional: generally well-appearing Psychiatric: alert and oriented x3 Eyes: extraocular movements intact Mouth: oral pharynx moist, no lesions Neck: supple no lymphadenopathy Cardiovascular: heart regular rate and rhythm Lungs: clear to auscultation bilaterally Abdomen: soft, nontender, nondistended, no obvious ascites, no peritoneal signs, normal bowel sounds Extremities: no lower extremity edema bilaterally Skin: no lesions on visible extremities   Assessment and plan: 78 y.o. female with intermittent sharp epigastric pains  CT scan 2018 suggest that she has a gallstone, ultrasound more recently described this as a possible polyp.  It was 8 mm and echogenic.  If this in fact is a small gallstone it could certainly be causing her intermittent epigastric pains.  I am going to refer her to her surgeon who she knows quite well Dr. week feels to go over the imaging with her and consider cholecystectomy for possible symptomatic cholelithiasis.  Please see the "Patient Instructions" section for addition details about the plan.   Gwendolyn Loffler, MD [facility] 12/01/2018, 2:40 PM  Cc: Gaynelle Arabian, MD

## 2018-12-01 NOTE — Patient Instructions (Addendum)
You will be scheduled for an appointment with Dr Serita Grammes at [facility].  for registration. Make certain to bring a list of current medications, including any over the counter medications or vitamins. Also bring your co-pay if you have one as well as your insurance cards. [facility] is located at [address]. Should you need to reschedule your appointment, please contact them at [phone].  Thank you for entrusting me with your care and choosing [facility]  Dr Ardis Hughs

## 2018-12-07 DIAGNOSIS — M25551 Pain in right hip: Secondary | ICD-10-CM | POA: Diagnosis not present

## 2018-12-23 ENCOUNTER — Other Ambulatory Visit: Payer: Self-pay

## 2018-12-23 ENCOUNTER — Inpatient Hospital Stay: Payer: Medicare Other | Attending: Hematology

## 2018-12-23 ENCOUNTER — Inpatient Hospital Stay (HOSPITAL_BASED_OUTPATIENT_CLINIC_OR_DEPARTMENT_OTHER): Payer: Medicare Other | Admitting: Hematology

## 2018-12-23 ENCOUNTER — Telehealth: Payer: Self-pay | Admitting: Hematology

## 2018-12-23 VITALS — BP 163/103 | HR 77 | Temp 98.2°F | Resp 18 | Ht 61.0 in | Wt 93.9 lb

## 2018-12-23 DIAGNOSIS — Z79899 Other long term (current) drug therapy: Secondary | ICD-10-CM | POA: Insufficient documentation

## 2018-12-23 DIAGNOSIS — Z803 Family history of malignant neoplasm of breast: Secondary | ICD-10-CM | POA: Insufficient documentation

## 2018-12-23 DIAGNOSIS — Z7901 Long term (current) use of anticoagulants: Secondary | ICD-10-CM | POA: Diagnosis not present

## 2018-12-23 DIAGNOSIS — Z87891 Personal history of nicotine dependence: Secondary | ICD-10-CM | POA: Insufficient documentation

## 2018-12-23 DIAGNOSIS — Z853 Personal history of malignant neoplasm of breast: Secondary | ICD-10-CM | POA: Diagnosis not present

## 2018-12-23 DIAGNOSIS — D473 Essential (hemorrhagic) thrombocythemia: Secondary | ICD-10-CM

## 2018-12-23 DIAGNOSIS — Z808 Family history of malignant neoplasm of other organs or systems: Secondary | ICD-10-CM | POA: Insufficient documentation

## 2018-12-23 DIAGNOSIS — Z8489 Family history of other specified conditions: Secondary | ICD-10-CM | POA: Diagnosis not present

## 2018-12-23 DIAGNOSIS — Z8052 Family history of malignant neoplasm of bladder: Secondary | ICD-10-CM | POA: Insufficient documentation

## 2018-12-23 DIAGNOSIS — Z801 Family history of malignant neoplasm of trachea, bronchus and lung: Secondary | ICD-10-CM | POA: Insufficient documentation

## 2018-12-23 DIAGNOSIS — Z8249 Family history of ischemic heart disease and other diseases of the circulatory system: Secondary | ICD-10-CM | POA: Insufficient documentation

## 2018-12-23 DIAGNOSIS — Z800 Family history of malignant neoplasm of digestive organs: Secondary | ICD-10-CM | POA: Diagnosis not present

## 2018-12-23 DIAGNOSIS — R51 Headache: Secondary | ICD-10-CM | POA: Diagnosis not present

## 2018-12-23 DIAGNOSIS — I200 Unstable angina: Secondary | ICD-10-CM | POA: Diagnosis not present

## 2018-12-23 DIAGNOSIS — M818 Other osteoporosis without current pathological fracture: Secondary | ICD-10-CM | POA: Diagnosis not present

## 2018-12-23 LAB — CBC WITH DIFFERENTIAL/PLATELET
Abs Immature Granulocytes: 0.06 10*3/uL (ref 0.00–0.07)
Basophils Absolute: 0.1 10*3/uL (ref 0.0–0.1)
Basophils Relative: 1 %
Eosinophils Absolute: 0.1 10*3/uL (ref 0.0–0.5)
Eosinophils Relative: 1 %
HCT: 41 % (ref 36.0–46.0)
Hemoglobin: 13 g/dL (ref 12.0–15.0)
Immature Granulocytes: 1 %
Lymphocytes Relative: 14 %
Lymphs Abs: 0.9 10*3/uL (ref 0.7–4.0)
MCH: 30.8 pg (ref 26.0–34.0)
MCHC: 31.7 g/dL (ref 30.0–36.0)
MCV: 97.2 fL (ref 80.0–100.0)
Monocytes Absolute: 0.3 10*3/uL (ref 0.1–1.0)
Monocytes Relative: 5 %
Neutro Abs: 4.8 10*3/uL (ref 1.7–7.7)
Neutrophils Relative %: 78 %
Platelets: 292 10*3/uL (ref 150–400)
RBC: 4.22 MIL/uL (ref 3.87–5.11)
RDW: 15.7 % — ABNORMAL HIGH (ref 11.5–15.5)
WBC: 6.2 10*3/uL (ref 4.0–10.5)
nRBC: 0 % (ref 0.0–0.2)

## 2018-12-23 LAB — LACTATE DEHYDROGENASE: LDH: 253 U/L — ABNORMAL HIGH (ref 98–192)

## 2018-12-23 LAB — CMP (CANCER CENTER ONLY)
ALT: 14 U/L (ref 0–44)
AST: 18 U/L (ref 15–41)
Albumin: 4 g/dL (ref 3.5–5.0)
Alkaline Phosphatase: 48 U/L (ref 38–126)
Anion gap: 7 (ref 5–15)
BUN: 18 mg/dL (ref 8–23)
CO2: 29 mmol/L (ref 22–32)
Calcium: 9.9 mg/dL (ref 8.9–10.3)
Chloride: 103 mmol/L (ref 98–111)
Creatinine: 0.98 mg/dL (ref 0.44–1.00)
GFR, Est AFR Am: >60 mL/min (ref >60)
GFR, Estimated: 56 mL/min — ABNORMAL LOW (ref >60)
Glucose, Bld: 85 mg/dL (ref 70–99)
Potassium: 3.6 mmol/L (ref 3.5–5.1)
Sodium: 139 mmol/L (ref 135–145)
Total Bilirubin: 0.5 mg/dL (ref 0.3–1.2)
Total Protein: 6.7 g/dL (ref 6.5–8.1)

## 2018-12-23 NOTE — Progress Notes (Signed)
HEMATOLOGY/ONCOLOGY CLINIC NOTE  Date of Service: 12/23/2018  Patient Care Team: Gaynelle Arabian, MD as PCP - General (Family Medicine) Skeet Latch, MD as PCP - Cardiology (Cardiology) Annia Belt, MD as Consulting Physician (Oncology) Rolm Bookbinder, MD as Consulting Physician (General Surgery)  CHIEF COMPLAINTS/PURPOSE OF CONSULTATION:  Essential Thrombocytosis  HISTORY OF PRESENTING ILLNESS:   Gwendolyn Williams is a wonderful 78 y.o. female who has been referred to [place] by Dr. Gaynelle Arabian, and was previously under the care of Dr. 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 The pt reports that she is doing well overall.   The pt reports that she has seen Dr. Michail Sermon in GI and is concerned for esophageal spasm but does not wish to proceed with the diagnostic work up with esophageal manometry. She notes that muscle relaxants help with this occasional pain. She also follows up with Dr. Donne Hazel in surgery to determine if this related to her previous abdominal surgeries. 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  The pt notes that she does not have any other present concerns. She notes that she had SOB at the end of last year, as part of bronchiectasis, and takes Memory Dance once a day, denies needing her rescue inhaler. She denies positional elements to her SOB.  The pt notes that she was off Hyxdroxyurea during the Fall of 2019 during this work up. She then returned to '500mg'$  Hydoxyurea once a day and her PLT have normalized. She takes this in the morning with her food and denies any problems tolerating this medication.   The pt denies any concerns for bleeding at this time.  The pt continues with annual mammograms with her OBGYN Dr. Donalynn Furlong. The pt pursued maintenance Arimidex for 8.5 years, and stopped during the  evaluation of her respiratory symptoms in Fall 2019. The pt completes a bone density study every year, and notes she has stable osteopenia. She takes Vitamin D replacement and notes her levels are good.  She continues on '20mg'$  Xarelto. She continues on '81mg'$  aspirin as well.  The pt notes that she has had chronic mouth ulcers for "decades." She has tried Vitamin B complex and mouthwashes. She notes that she routinely gets mouth ulcers after her dental cleanings as well.  Most recent lab results (06/22/18) of CBC w/diff and CMP is as follows: all values are WNL except for RDW at 25.2, Potassium at 3.3, BUN at 30, Creatinine at 1.23, GFR at 43.  On review of systems, pt reports intermittent esophageal discomfort, stable breathing, good energy levels, some abdominal tenderness, chronic mouth ulcers, and denies concerns for bleeding, and any other symptoms.   Interval History:  Gwendolyn Williams returns today for management and evaluation of her Essential Thrombocytosis. The patient's last visit with [place] was on 09/22/2018. The pt reports that she is doing well overall.  The pt has no prohibitive toxicities from continuing '500mg'$  Hydroxyurea daily, at this time.  The pt reports that she feels the same. She denies problems taking hydroxyurea. Pt has been taking tylenol daily for headaches.  Lab results today (12/23/18) of CBC w/diff and CMP is as follows: all values are WNL except for RDW at 15.7 and GFR Est Non Af Am at 56.  On review of systems, pt reports some bruising and mouth sores, weight loss, headaches possibly from stress, and abdominal  tenderness. She denies blood clots, leg swelling, bleeding, shortness of breath and any other symptoms.    MEDICAL HISTORY:  Past Medical History:  Diagnosis Date  . Arthritis   . Asthma    controlled with singulair  . Breast cancer ([place])    stage I left breast  . Current use of long term anticoagulation 04/21/2017  . Dyspnea, unspecified 02/24/2017    Started coincident with [place], exertional and orthostatic, normal exam. O2 sat 100%.  . Family history of breast cancer   . GERD (gastroesophageal reflux disease)   . H/O blood clots    clotting disorder since 2010  . Hematoma of arm 08/09/2012  . 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 Hyperlipidemia   . Hypertension   . 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 Pneumonia    viral pneumonia as a kid  . PONV (postoperative nausea and vomiting)     SURGICAL HISTORY: Past Surgical History:  Procedure Laterality Date  . BREAST SURGERY     Left breast lumpectomy sentinel node biopsy  . CERVICAL SPINE SURGERY  05/23/2008  . COLECTOMY  04/11/2008   left colectomy with end colostomy due to clot  . COLOSTOMY TAKEDOWN  08/14/2008  . EYE SURGERY     bil cataracts  . HERNIA REPAIR  2011   [facility]  . INTRAVASCULAR PRESSURE WIRE/FFR STUDY N/A 01/25/2018   Procedure: INTRAVASCULAR PRESSURE WIRE/FFR STUDY;  Surgeon: Nelva Bush, MD;  Location: [place] [facility];  Service: Cardiovascular;  Laterality: N/A;  . LEFT HEART CATH AND CORONARY ANGIOGRAPHY N/A 01/25/2018   Procedure: LEFT HEART CATH AND CORONARY ANGIOGRAPHY;  Surgeon: Nelva Bush, MD;  Location: [place] [facility];  Service: Cardiovascular;  Laterality: N/A;  . LUMBAR [place] SURGERY  2006  . TOTAL HIP ARTHROPLASTY Left 10/07/2016   Procedure: LEFT TOTAL HIP ARTHROPLASTY ANTERIOR APPROACH;  Surgeon: Paralee Cancel, MD;  Location: [facility];  Service: Orthopedics;  Laterality: Left;  70 mins    SOCIAL HISTORY: Social History   Socioeconomic History  . Marital status: Married    Spouse name: Not on file  . Number of children: Not on file  . Years of education: Not on file  . Highest education level: Not on file  Occupational History  . Not on file  Social Needs  . Financial resource strain: Not on file  . Food insecurity    Worry: Not on file     Inability: Not on file  . Transportation needs    Medical: Not on file    Non-medical: Not on file  Tobacco Use  . Smoking status: Former Smoker    Packs/day: 0.50    Years: 6.00    Pack years: 3.00    Quit date: 01/06/1964    Years since quitting: 55.0  . Smokeless tobacco: Never Used  Substance and Sexual Activity  . Alcohol use: Yes    Alcohol/week: 0.0 standard drinks    Comment: Rare  . Drug use: No  . Sexual activity: Not Currently    Partners: Male    Comment: 1st intercourse 78 yo-Fewer than 5 partners  Lifestyle  . Physical activity    Days per week: Not on file    Minutes per session: Not on file  . Stress: Not on file  Relationships  . Social Herbalist on phone: Not on file    Gets together:  Not on file    Attends religious service: Not on file    Active member of club or organization: Not on file    Attends meetings of clubs or organizations: Not on file    Relationship status: Not on file  . Intimate partner violence    Fear of current or ex partner: Not on file    Emotionally abused: Not on file    Physically abused: Not on file    Forced sexual activity: Not on file  Other Topics Concern  . Not on file  Social History Narrative  . Not on file    FAMILY HISTORY: Family History  Problem Relation Age of Onset  . Breast cancer Mother 53  . Lung cancer Mother   . Heart disease Father   . Basal cell carcinoma Father        x2  . CAD Father   . Ovarian cysts Sister   . Dementia Sister   . Breast cancer Maternal Aunt 65  . Breast cancer Paternal Aunt 50  . Ovarian cysts Maternal Grandmother        possibly had ovarian cancer as well  . Breast cancer Maternal Aunt 36  . Cancer Other        either pancreatic or colon  . Other Daughter 54       breast lumpectomy- complex sclerosing lesion  . Polycystic ovary syndrome Daughter   . Bladder Cancer Cousin     ALLERGIES:  is allergic to lipitor [atorvastatin calcium]; penicillins; sulfa  antibiotics; and tamiflu [oseltamivir phosphate].  MEDICATIONS:  Current Outpatient Medications  Medication Sig Dispense Refill  . albuterol (PROVENTIL HFA;VENTOLIN HFA) 108 (90 Base) MCG/ACT inhaler Inhale 2 puffs into the lungs every 6 (six) hours as needed for wheezing or shortness of breath. 1 Inhaler 2  . amLODipine (NORVASC) 5 MG tablet Take 5 mg by mouth daily.    Marland Kitchen aspirin 81 MG tablet Take 81 mg by mouth daily.      . Cholecalciferol (VITAMIN D) 2000 units CAPS Take 2,000 Units by mouth daily.    Marland Kitchen docusate sodium (COLACE) 100 MG capsule Take 1 capsule (100 mg total) by mouth 2 (two) times daily. (Patient taking differently: Take 100 mg by mouth daily. ) 10 capsule 0  . Fluticasone Furoate-Vilanterol (BREO ELLIPTA IN) Inhale into the lungs daily.    . folic acid (FOLVITE) 1 MG tablet Take 1 mg by mouth daily.    . hydroxyurea (HYDREA) 500 MG capsule Take 1 capsule (500 mg total) by mouth daily. May take with food to minimize GI side effects. 90 capsule 1  . hyoscyamine (LEVSIN SL) 0.125 MG SL tablet Place 0.125 mg under the tongue daily as needed. Takes 2 tablets    . ondansetron (ZOFRAN) 4 MG tablet Take 1 tablet (4 mg total) by mouth every 6 (six) hours as needed for nausea. 30 tablet 0  . polyethylene glycol (MIRALAX / GLYCOLAX) packet Take 17 g by mouth 2 (two) times daily. (Patient taking differently: Take 17 g by mouth daily. ) 14 each 0  . rivaroxaban (XARELTO) 20 MG TABS tablet Take 1 tablet (20 mg total) by mouth daily with supper. 90 tablet 3  . rosuvastatin (CRESTOR) 20 MG tablet TAKE ONE TABLET BY MOUTH DAILY 90 tablet 2  . Spacer/Aero-Holding Chambers DEVI 1 Device by Does not apply route as directed. 1 each 0  . triamterene-hydrochlorothiazide (DYAZIDE) 37.5-25 MG capsule Take 1 capsule by mouth daily.  No current facility-administered medications for this visit.     REVIEW OF SYSTEMS:   A 10+ POINT REVIEW OF SYSTEMS WAS OBTAINED including neurology, dermatology,  psychiatry, cardiac, respiratory, lymph, extremities, GI, GU, Musculoskeletal, constitutional, breasts, reproductive, HEENT.  All pertinent positives are noted in the HPI.  All others are negative.    PHYSICAL EXAMINATION: ECOG FS:2 - Symptomatic, <50% confined to bed  There were no vitals filed for this visit. Wt Readings from Last 3 Encounters:  12/01/18 92 lb 12.8 oz (42.1 kg)  09/22/18 96 lb 14.4 oz (44 kg)  06/22/18 99 lb 3.2 oz (45 kg)   There is no height or weight on file to calculate BMI.    GENERAL:alert, in no acute distress and comfortable SKIN: no acute rashes, no significant lesions EYES: conjunctiva are pink and non-injected, sclera anicteric OROPHARYNX: MMM, no exudates, no oropharyngeal erythema or ulceration NECK: supple, no JVD LYMPH:  no palpable lymphadenopathy in the cervical, axillary or inguinal regions LUNGS: clear to auscultation b/l with normal respiratory effort HEART: regular rate & rhythm ABDOMEN:  normoactive bowel sounds , non tender, not distended. Extremity: no pedal edema PSYCH: alert & oriented x 3 with fluent speech NEURO: no focal motor/sensory deficits   LABORATORY DATA:  I have reviewed the data as listed  . CBC Latest Ref Rng & Units 12/23/2018 09/22/2018 06/22/2018  WBC 4.0 - 10.5 K/uL 6.2 6.5 6.4  Hemoglobin 12.0 - 15.0 g/dL 13.0 12.5 12.3  Hematocrit 36.0 - 46.0 % 41.0 39.7 40.1  Platelets 150 - 400 K/uL 292 377 384    . CMP Latest Ref Rng & Units 12/23/2018 09/22/2018 09/06/2018  Glucose 70 - 99 mg/dL 85 65(L) 67  BUN 8 - 23 mg/dL '18 22 27  '$ Creatinine 0.44 - 1.00 mg/dL 0.98 0.99 1.02(H)  Sodium 135 - 145 mmol/L 139 143 141  Potassium 3.5 - 5.1 mmol/L 3.6 3.8 3.6  Chloride 98 - 111 mmol/L 103 103 103  CO2 22 - 32 mmol/L '29 29 23  '$ Calcium 8.9 - 10.3 mg/dL 9.9 9.7 9.6  Total Protein 6.5 - 8.1 g/dL 6.7 6.9 5.8(L)  Total Bilirubin 0.3 - 1.2 mg/dL 0.5 0.6 0.5  Alkaline Phos 38 - 126 U/L 48 51 47  AST 15 - 41 U/L '18 19 14  '$ ALT 0 - 44  U/L '14 15 10   '$ 06/07/08 JAK2:    RADIOGRAPHIC STUDIES: I have personally reviewed the radiological images as listed and agreed with the findings in the report. No results found.  ASSESSMENT & PLAN:  78 y.o. female with  1. 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  2. 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 S/p Lumpectomy, Radiation, 4 cycles of Cytoxan and Taxotere. Completed maintenance Arimidex until Fall 2019 Continues with annual mammograms with OBGYN Dr. Donalynn Furlong.  PLAN: -Discussed pt labwork today, 12/23/18;  all values are WNL except for RDW at 15.7 and GFR Est Non Af Am at 56. PLT at 292k - at goal -The pt has no prohibitive toxicities from continuing '500mg'$  Hydroxyurea daily, at this time. - Discussed that blood counts were normal -Discussed holding hydroxyurea prior to possible procedure with G.I. -Recommended labs every 3 months due to taking hydroxyurea. Possible every 6 months if labs are done in between by PCP or other provider, -Recommended baking soda  and salt water rinse or taking B vitamins for mouth sores. -Discussed taking hydroxyurea at night time with a snack to see if it improves headaches.  -Reviewed watching weight loss to determine if hydroxyurea dosage is appropriate.  FOLLOW UP: RTC with Dr Irene Limbo with labs in 3 months  The total time spent in the appt was 15 minutes and more than 50% was on counseling and direct patient cares.  All of the patient's questions were answered with apparent satisfaction. The patient knows to call the clinic with any problems, questions or concerns.     Sullivan Lone MD MS AAHIVMS [facility] Hematology/Oncology Physician [facility]  (Office):       [phone] (Work cell):  [phone] (Fax):            [phone]  12/23/2018 7:49 AM  I, Jacqualyn Posey, am acting as a Education administrator for Dr. Sullivan Lone.   .I have reviewed the above documentation for accuracy and completeness, and I agree with the above. Brunetta Genera MD

## 2018-12-23 NOTE — Telephone Encounter (Signed)
Scheduled per 09/24 los, patient received calender and after visit summary.

## 2018-12-24 ENCOUNTER — Encounter: Payer: Self-pay | Admitting: Hematology

## 2018-12-29 DIAGNOSIS — R1013 Epigastric pain: Secondary | ICD-10-CM | POA: Diagnosis not present

## 2018-12-30 ENCOUNTER — Other Ambulatory Visit: Payer: Self-pay | Admitting: General Surgery

## 2018-12-30 ENCOUNTER — Other Ambulatory Visit (HOSPITAL_COMMUNITY): Payer: Self-pay | Admitting: General Surgery

## 2018-12-30 ENCOUNTER — Telehealth: Payer: Self-pay | Admitting: *Deleted

## 2018-12-30 DIAGNOSIS — R1013 Epigastric pain: Secondary | ICD-10-CM

## 2018-12-30 NOTE — Telephone Encounter (Signed)
Contacted patient in response to My Chart question. Per Dr. Irene Limbo, taking acetaminophen for headaches is ok providing she does not exceed daily dose of 2000mg  - should avoid taking daily or large amounts. Patient verbalized understanding

## 2019-01-04 DIAGNOSIS — C946 Myelodysplastic disease, not classified: Secondary | ICD-10-CM | POA: Diagnosis not present

## 2019-01-04 DIAGNOSIS — N1831 Chronic kidney disease, stage 3a: Secondary | ICD-10-CM | POA: Diagnosis not present

## 2019-01-04 DIAGNOSIS — Z853 Personal history of malignant neoplasm of breast: Secondary | ICD-10-CM | POA: Diagnosis not present

## 2019-01-04 DIAGNOSIS — E7800 Pure hypercholesterolemia, unspecified: Secondary | ICD-10-CM | POA: Diagnosis not present

## 2019-01-04 DIAGNOSIS — I81 Portal vein thrombosis: Secondary | ICD-10-CM | POA: Diagnosis not present

## 2019-01-04 DIAGNOSIS — M810 Age-related osteoporosis without current pathological fracture: Secondary | ICD-10-CM | POA: Diagnosis not present

## 2019-01-04 DIAGNOSIS — J479 Bronchiectasis, uncomplicated: Secondary | ICD-10-CM | POA: Diagnosis not present

## 2019-01-04 DIAGNOSIS — K219 Gastro-esophageal reflux disease without esophagitis: Secondary | ICD-10-CM | POA: Diagnosis not present

## 2019-01-04 DIAGNOSIS — I129 Hypertensive chronic kidney disease with stage 1 through stage 4 chronic kidney disease, or unspecified chronic kidney disease: Secondary | ICD-10-CM | POA: Diagnosis not present

## 2019-01-06 ENCOUNTER — Encounter: Payer: Self-pay | Admitting: Gynecology

## 2019-01-07 ENCOUNTER — Ambulatory Visit (HOSPITAL_COMMUNITY)
Admission: RE | Admit: 2019-01-07 | Discharge: 2019-01-07 | Disposition: A | Discharge status: Home / Self Care | Payer: Medicare Other | Source: Ambulatory Visit | Attending: General Surgery | Admitting: General Surgery

## 2019-01-07 ENCOUNTER — Other Ambulatory Visit: Payer: Self-pay

## 2019-01-07 DIAGNOSIS — R1013 Epigastric pain: Secondary | ICD-10-CM | POA: Insufficient documentation

## 2019-01-07 MED ORDER — TECHNETIUM TC 99M MEBROFENIN IV KIT
5.3000 | PACK | Freq: Once | INTRAVENOUS | Status: AC | PRN
  Administered 2019-01-07: 5.3 via INTRAVENOUS

## 2019-01-12 DIAGNOSIS — R109 Unspecified abdominal pain: Secondary | ICD-10-CM | POA: Diagnosis not present

## 2019-01-14 ENCOUNTER — Telehealth: Payer: Self-pay

## 2019-01-14 NOTE — Telephone Encounter (Signed)
-----   Message from Milus Banister, MD sent at 01/14/2019  5:28 AM EDT ----- Gwendolyn Williams doing well.  Looking forward to 2021.  Thanks for seeing her.  I thought her GB was a long-shot but figured it would be good for her to hear that from you as well.  Definitely happy to see her back.  We'll reach out to her.  Thanks  [employer], Can you contact her and offer my next available ROV to discuss her symptoms again.  Thanks  ----- Message ----- From: Rolm Bookbinder, MD Sent: 01/12/2019   3:58 PM EDT To: Milus Banister, MD  [facility] you are well. I saw her in follow up. We know each other well.  I sent for a hida scan that was normal. No symptoms either. I really dont think her gb is causing her symptoms.  I told her today the only way to figure it out would be to take it out. This likely will be difficult given the colectomy, takedown and the big mesh of mesh I later inserted into her. Surgery not benign either given her anticoagulation.  I am willing to do it if needed but I dont think that is causing symptoms.  Thoughts? Would you be willing to talk to her again? [place]

## 2019-01-14 NOTE — Telephone Encounter (Signed)
appt has been made for 02/22/19 at 1050 am with Dr Ardis Hughs. Pt has been notified

## 2019-02-01 DIAGNOSIS — I129 Hypertensive chronic kidney disease with stage 1 through stage 4 chronic kidney disease, or unspecified chronic kidney disease: Secondary | ICD-10-CM | POA: Diagnosis not present

## 2019-02-01 DIAGNOSIS — R519 Headache, unspecified: Secondary | ICD-10-CM | POA: Diagnosis not present

## 2019-02-03 ENCOUNTER — Other Ambulatory Visit: Payer: Self-pay | Admitting: Family Medicine

## 2019-02-03 DIAGNOSIS — R519 Headache, unspecified: Secondary | ICD-10-CM

## 2019-02-04 ENCOUNTER — Encounter: Payer: Self-pay | Admitting: Gynecology

## 2019-02-04 DIAGNOSIS — Z853 Personal history of malignant neoplasm of breast: Secondary | ICD-10-CM | POA: Diagnosis not present

## 2019-02-04 DIAGNOSIS — Z1231 Encounter for screening mammogram for malignant neoplasm of breast: Secondary | ICD-10-CM | POA: Diagnosis not present

## 2019-02-09 ENCOUNTER — Ambulatory Visit
Admission: RE | Admit: 2019-02-09 | Discharge: 2019-02-09 | Disposition: A | Discharge status: Home / Self Care | Payer: Medicare Other | Source: Ambulatory Visit | Attending: Family Medicine | Admitting: Family Medicine

## 2019-02-09 ENCOUNTER — Other Ambulatory Visit: Payer: Self-pay

## 2019-02-09 DIAGNOSIS — R519 Headache, unspecified: Secondary | ICD-10-CM | POA: Diagnosis not present

## 2019-02-10 DIAGNOSIS — H26493 Other secondary cataract, bilateral: Secondary | ICD-10-CM | POA: Diagnosis not present

## 2019-02-10 DIAGNOSIS — H524 Presbyopia: Secondary | ICD-10-CM | POA: Diagnosis not present

## 2019-02-10 DIAGNOSIS — Z961 Presence of intraocular lens: Secondary | ICD-10-CM | POA: Diagnosis not present

## 2019-02-10 DIAGNOSIS — H5213 Myopia, bilateral: Secondary | ICD-10-CM | POA: Diagnosis not present

## 2019-02-22 ENCOUNTER — Encounter: Payer: Self-pay | Admitting: Gastroenterology

## 2019-02-22 ENCOUNTER — Ambulatory Visit (INDEPENDENT_AMBULATORY_CARE_PROVIDER_SITE_OTHER): Payer: Medicare Other | Admitting: Gastroenterology

## 2019-02-22 VITALS — BP 128/80 | HR 84 | Temp 96.9°F | Ht 61.0 in | Wt 94.4 lb

## 2019-02-22 DIAGNOSIS — I200 Unstable angina: Secondary | ICD-10-CM

## 2019-02-22 DIAGNOSIS — R1013 Epigastric pain: Secondary | ICD-10-CM

## 2019-02-22 MED ORDER — HYOSCYAMINE SULFATE ER 0.375 MG PO TB12: 0.3750 mg | ORAL_TABLET | Freq: Two times a day (BID) | ORAL | 11 refills | Status: DC

## 2019-02-22 NOTE — Patient Instructions (Signed)
We have sent the following medications to your pharmacy for you to pick up at your convenience: Levbid.   Call our office back in 4-6 weeks with an update on your symptoms.

## 2019-02-22 NOTE — Progress Notes (Signed)
Review of pertinent gastrointestinal problems: 1.  Chronic epigastric pain.  Daily, sharp pains it can last for minutes up to 2 hours.  Work-up March 2020 right upper quadrant ultrasound findings 8 mm gallbladder polyp".  EGD Dr. Gladis Riffle January 2020 small hiatal hernia mild gastritis, biopsies showed chronic inactive gastritis negative for H. pylori.  CT scan abdomen pelvis with IV and oral contrast 2018 August "small gallstone is noted".  October 2020 HIDA scan was normal with the gallbladder ejection fraction 51%.  Blood work November 2020 normal CBC, normal complete metabolic profile  HPI: This is a pleasant 78 year old woman who is here with her husband today.  I last saw her about 2 and half months ago.  At that time I thought that possibly her gallbladder polyp was causing some of her intermittent right upper quadrant epigastric pains.  I had her meet with her very trusted surgeon Dr. Donne Hazel.  She ended up having a HIDA scan which showed normal gallbladder ejection fraction.  He and I spoke about this and we agreed it seems unlikely that her gallbladder was causing these pains.  She described the pains again.  They are epigastric, low sternum.  They can last 2 hours.  Very often if she takes a hyoscyamine her pain will be relieved.  She does not have dysphagia.  She does not have chest pain.  The pain does not radiate.  Eating has no effect on the pain, BMs do not have any effect on the pain.  She was on once daily proton pump inhibitor in the past and that did not help her pains.  She does have mild intermittent GERD that is well controlled on famotidine.  Chief complaint is intermittent epigastric pain  Her weight is up 2 pounds since her last visit 2 and half months ago.  ROS: complete GI ROS as described in HPI, all other review negative.  Constitutional:  No unintentional weight loss   Past Medical History:  Diagnosis Date  . Arthritis   . Asthma    controlled with  singulair  . Breast cancer ([place])    stage I left breast  . Current use of long term anticoagulation 04/21/2017  . Dyspnea, unspecified 02/24/2017   Started coincident with Hydrea, exertional and orthostatic, normal exam. O2 sat 100%.  . Family history of breast cancer   . GERD (gastroesophageal reflux disease)   . H/O blood clots    clotting disorder since 2010  . Hematoma of arm 08/09/2012  . Hypercoagulable state, primary ([place])    JAK2 gene defect, thrombocythemia  . Hyperlipidemia   . Hypertension   . 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 Pneumonia    viral pneumonia as a kid  . PONV (postoperative nausea and vomiting)     Past Surgical History:  Procedure Laterality Date  . BREAST SURGERY     Left breast lumpectomy sentinel node biopsy  . CERVICAL SPINE SURGERY  05/23/2008  . COLECTOMY  04/11/2008   left colectomy with end colostomy due to clot  . COLOSTOMY TAKEDOWN  08/14/2008  . EYE SURGERY     bil cataracts  . HERNIA REPAIR  2011   [facility]  . INTRAVASCULAR PRESSURE WIRE/FFR STUDY N/A 01/25/2018   Procedure: INTRAVASCULAR PRESSURE WIRE/FFR STUDY;  Surgeon: Nelva Bush, MD;  Location: [facility];  Service: Cardiovascular;  Laterality: N/A;  . LEFT HEART CATH AND CORONARY ANGIOGRAPHY N/A 01/25/2018  Procedure: LEFT HEART CATH AND CORONARY ANGIOGRAPHY;  Surgeon: Nelva Bush, MD;  Location: [facility];  Service: Cardiovascular;  Laterality: N/A;  . LUMBAR [place] SURGERY  2006  . TOTAL HIP ARTHROPLASTY Left 10/07/2016   Procedure: LEFT TOTAL HIP ARTHROPLASTY ANTERIOR APPROACH;  Surgeon: Paralee Cancel, MD;  Location: [facility];  Service: Orthopedics;  Laterality: Left;  70 mins    Current Outpatient Medications  Medication Sig Dispense Refill  . albuterol (PROVENTIL HFA;VENTOLIN HFA) 108 (90 Base) MCG/ACT inhaler Inhale 2 puffs into the lungs every 6 (six) hours as needed for wheezing or shortness of breath. 1  Inhaler 2  . amLODipine (NORVASC) 5 MG tablet Take 5 mg by mouth daily.    Marland Kitchen aspirin 81 MG tablet Take 81 mg by mouth daily.      . Cholecalciferol (VITAMIN D) 2000 units CAPS Take 2,000 Units by mouth daily.    Marland Kitchen docusate sodium (COLACE) 100 MG capsule Take 1 capsule (100 mg total) by mouth 2 (two) times daily. (Patient taking differently: Take 100 mg by mouth daily. ) 10 capsule 0  . Fluticasone Furoate-Vilanterol (BREO ELLIPTA IN) Inhale into the lungs daily.    . folic acid (FOLVITE) 1 MG tablet Take 1 mg by mouth daily.    . hydroxyurea (HYDREA) 500 MG capsule Take 1 capsule (500 mg total) by mouth daily. May take with food to minimize GI side effects. 90 capsule 1  . hyoscyamine (LEVSIN SL) 0.125 MG SL tablet Place 0.125 mg under the tongue daily as needed. Takes 2 tablets    . ondansetron (ZOFRAN) 4 MG tablet Take 1 tablet (4 mg total) by mouth every 6 (six) hours as needed for nausea. 30 tablet 0  . polyethylene glycol (MIRALAX / GLYCOLAX) packet Take 17 g by mouth 2 (two) times daily. (Patient taking differently: Take 17 g by mouth daily. ) 14 each 0  . rivaroxaban (XARELTO) 20 MG TABS tablet Take 1 tablet (20 mg total) by mouth daily with supper. 90 tablet 3  . rosuvastatin (CRESTOR) 20 MG tablet TAKE ONE TABLET BY MOUTH DAILY 90 tablet 2  . Spacer/Aero-Holding Chambers DEVI 1 Device by Does not apply route as directed. 1 each 0  . triamterene-hydrochlorothiazide (DYAZIDE) 37.5-25 MG capsule Take 1 capsule by mouth daily.     No current facility-administered medications for this visit.     Allergies as of 02/22/2019 - Review Complete 02/22/2019  Allergen Reaction Noted  . Lipitor [atorvastatin calcium]  05/28/2018  . Penicillins Hives 08/30/2010  . Sulfa antibiotics Other (See Comments) 08/04/2011  . Tamiflu [oseltamivir phosphate] Nausea And Vomiting 04/23/2018    Family History  Problem Relation Age of Onset  . Breast cancer Mother 53  . Lung cancer Mother   . Heart disease  Father   . Basal cell carcinoma Father        x2  . CAD Father   . Ovarian cysts Sister   . Dementia Sister   . Breast cancer Maternal Aunt 74  . Breast cancer Paternal Aunt 55  . Ovarian cysts Maternal Grandmother        possibly had ovarian cancer as well  . Breast cancer Maternal Aunt 28  . Cancer Other        either pancreatic or colon  . Other Daughter 50       breast lumpectomy- complex sclerosing lesion  . Polycystic ovary syndrome Daughter   . Bladder Cancer Cousin     Social History  Socioeconomic History  . Marital status: Married    Spouse name: Not on file  . Number of children: Not on file  . Years of education: Not on file  . Highest education level: Not on file  Occupational History  . Not on file  Social Needs  . Financial resource strain: Not on file  . Food insecurity    Worry: Not on file    Inability: Not on file  . Transportation needs    Medical: Not on file    Non-medical: Not on file  Tobacco Use  . Smoking status: Former Smoker    Packs/day: 0.50    Years: 6.00    Pack years: 3.00    Quit date: 01/06/1964    Years since quitting: 55.1  . Smokeless tobacco: Never Used  Substance and Sexual Activity  . Alcohol use: Yes    Alcohol/week: 0.0 standard drinks    Comment: Rare  . Drug use: No  . Sexual activity: Not Currently    Partners: Male    Comment: 1st intercourse 78 yo-Fewer than 5 partners  Lifestyle  . Physical activity    Days per week: Not on file    Minutes per session: Not on file  . Stress: Not on file  Relationships  . Social Herbalist on phone: Not on file    Gets together: Not on file    Attends religious service: Not on file    Active member of club or organization: Not on file    Attends meetings of clubs or organizations: Not on file    Relationship status: Not on file  . Intimate partner violence    Fear of current or ex partner: Not on file    Emotionally abused: Not on file    Physically abused:  Not on file    Forced sexual activity: Not on file  Other Topics Concern  . Not on file  Social History Narrative  . Not on file     Physical Exam: BP 128/80   Pulse 84   Temp (!) 96.9 F (36.1 C)   Ht 5\' 1"  (1.549 m)   Wt 94 lb 6.4 oz (42.8 kg)   BMI 17.84 kg/m  Constitutional: generally well-appearing Psychiatric: alert and oriented x3 Abdomen: soft, nontender, nondistended, no obvious ascites, no peritoneal signs, normal bowel sounds No peripheral edema noted in lower extremities  Assessment and plan: 78 y.o. female with intermittent epigastric pain  Unclear etiology however I think we have ruled out gastric, gallbladder pains pretty well.  We did discuss it was possible this is a variant of esophageal spasms and I explained to her that knowing for certain if she has spastic esophagus would require esophageal motility testing and she absolutely declines to have that test.  Instead I recommended changing her formulary of antispasm medicine from sublingual immediate release formulation to a oral pill twice daily formulation to see if that can help prevent some of these issues.  She will call to report on her response after a trial of Levbid in 4 to 6 weeks.  Please see the "Patient Instructions" section for addition details about the plan.  Owens Loffler, MD [facility] 02/22/2019, 10:54 AM

## 2019-03-11 ENCOUNTER — Telehealth: Payer: Self-pay | Admitting: Hematology

## 2019-03-11 NOTE — Telephone Encounter (Signed)
[place] 12/24 moved lab/fu to 1/18. Confirmed with patient.

## 2019-03-15 ENCOUNTER — Ambulatory Visit (INDEPENDENT_AMBULATORY_CARE_PROVIDER_SITE_OTHER): Payer: Medicare Other | Admitting: Cardiovascular Disease

## 2019-03-15 ENCOUNTER — Other Ambulatory Visit: Payer: Self-pay

## 2019-03-15 ENCOUNTER — Encounter: Payer: Self-pay | Admitting: Cardiovascular Disease

## 2019-03-15 VITALS — BP 154/94 | HR 66 | Temp 97.1°F | Ht 59.75 in | Wt 95.0 lb

## 2019-03-15 DIAGNOSIS — I200 Unstable angina: Secondary | ICD-10-CM

## 2019-03-15 DIAGNOSIS — I10 Essential (primary) hypertension: Secondary | ICD-10-CM | POA: Diagnosis not present

## 2019-03-15 DIAGNOSIS — I2510 Atherosclerotic heart disease of native coronary artery without angina pectoris: Secondary | ICD-10-CM

## 2019-03-15 NOTE — Progress Notes (Signed)
Cardiology Office Note   Date:  03/15/2019   ID:  Gwendolyn Williams, DOB 1941/02/12, MRN [id number]  PCP:  Gaynelle Arabian, MD  Cardiologist:   Skeet Latch, MD   No chief complaint on file.    History of Present Illness: Gwendolyn Williams is a 78 y.o. 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  She was initially seen 10/4 for an evaluation of shortness of breath.  Gwendolyn Williams saw Dr. Hughie Closs on 8/26 and reported exertional dyspnea when lifting weights or walking up hills.  She had a chest x-ray that showed some changes consistent with asthma.  She was referred for an echo 10/2017 that revealed LVEF 55 to 60% with grade 1 diastolic dysfunction.  She was referred to cardiology for further evaluation.  She has been on Arimadex for the last 10 years and initially thought her shortness of breath was attributable to this medication.She was referred for an ETT 01/07/18 that was negative for ischemia.  She achieved 4.3 METS.  She had a coronary CT that was concerning for proximal LAD stenosis.  FFR of that region was 0.64.  She underwent left heart catheterization 01/17/2018 that revealed 30% proximal and 50% mid LAD stenoses.  She also had 50% OM 2.  FFR of the LAD was nonsignificant.  It was felt that CAD was not causing her symptoms.  She was started on Imdur 30 mg.  Since that time she has spoken with Dr. Beryle Beams who is concerned that anastrozole could be causing her dyspnea and recommended that she stop taking it.  She has also been referred to pulmonology.  She followed up with Jory Sims.  She had myalgias on atorvastatin and switched to rosuvastatin.  She has been tolerating this well.  She walks a mile daily and has no exertional chest pain or shortness of breath.  Since her last  appointment she was seen by pulmonology and diagnosed with bronchiectasis.  She has been taking Breo and noted significant improvement in her symptoms.  She is now able to take a deep breath even when walking.  She also has likely esophageal spasms.  She cannot undergo the full testing because she does not think she would be able to tolerate manometry.  She was started on hyoscyamine and has not had any chest pain for the last 5 days.  Prior to this she was having chest pain daily.  She never has exertional chest pain.  She has minimal lower extremity edema and denies orthopnea or PND.  She also has no lightheadedness or dizziness.  She has been struggling with headaches almost daily.  She had a head CT that had no acute pathology.  She did not check her blood pressure much at home anymore because it had been well-controlled.  Past Medical History:  Diagnosis Date  . Arthritis   . Asthma    controlled with singulair  . Breast cancer ([place])    stage I left breast  . Current use of long term anticoagulation 04/21/2017  . Dyspnea, unspecified 02/24/2017   Started coincident with Hydrea, exertional and orthostatic, normal exam. O2 sat 100%.  . Family history of breast cancer   . GERD (gastroesophageal reflux disease)   . H/O blood clots    clotting disorder since 2010  . Hematoma of arm 08/09/2012  . Hypercoagulable state, primary ([place])  JAK2 gene defect, thrombocythemia  . Hyperlipidemia   . Hypertension   . 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 Pneumonia    viral pneumonia as a kid  . PONV (postoperative nausea and vomiting)     Past Surgical History:  Procedure Laterality Date  . BREAST SURGERY     Left breast lumpectomy sentinel node biopsy  . CERVICAL SPINE SURGERY  05/23/2008  . COLECTOMY  04/11/2008   left colectomy with end colostomy due to clot  . COLOSTOMY TAKEDOWN  08/14/2008  . EYE SURGERY     bil cataracts  . HERNIA REPAIR   2011   [facility]  . INTRAVASCULAR PRESSURE WIRE/FFR STUDY N/A 01/25/2018   Procedure: INTRAVASCULAR PRESSURE WIRE/FFR STUDY;  Surgeon: Nelva Bush, MD;  Location: [facility];  Service: Cardiovascular;  Laterality: N/A;  . LEFT HEART CATH AND CORONARY ANGIOGRAPHY N/A 01/25/2018   Procedure: LEFT HEART CATH AND CORONARY ANGIOGRAPHY;  Surgeon: Nelva Bush, MD;  Location: [facility];  Service: Cardiovascular;  Laterality: N/A;  . LUMBAR [place] SURGERY  2006  . TOTAL HIP ARTHROPLASTY Left 10/07/2016   Procedure: LEFT TOTAL HIP ARTHROPLASTY ANTERIOR APPROACH;  Surgeon: Paralee Cancel, MD;  Location: [facility];  Service: Orthopedics;  Laterality: Left;  70 mins     Current Outpatient Medications  Medication Sig Dispense Refill  . albuterol (PROVENTIL HFA;VENTOLIN HFA) 108 (90 Base) MCG/ACT inhaler Inhale 2 puffs into the lungs every 6 (six) hours as needed for wheezing or shortness of breath. 1 Inhaler 2  . amLODipine (NORVASC) 5 MG tablet Take 5 mg by mouth daily.    Marland Kitchen aspirin 81 MG tablet Take 81 mg by mouth daily.      . Cholecalciferol (VITAMIN D) 2000 units CAPS Take 2,000 Units by mouth daily.    Marland Kitchen docusate sodium (COLACE) 100 MG capsule Take 1 capsule (100 mg total) by mouth 2 (two) times daily. (Patient taking differently: Take 100 mg by mouth daily. ) 10 capsule 0  . Fluticasone Furoate-Vilanterol (BREO ELLIPTA IN) Inhale into the lungs daily.    . folic acid (FOLVITE) 1 MG tablet Take 1 mg by mouth daily.    . hydroxyurea (HYDREA) 500 MG capsule Take 1 capsule (500 mg total) by mouth daily. May take with food to minimize GI side effects. 90 capsule 1  . hyoscyamine (LEVBID) 0.375 MG 12 hr tablet Take 1 tablet (0.375 mg total) by mouth 2 (two) times daily. 60 tablet 11  . ondansetron (ZOFRAN) 4 MG tablet Take 1 tablet (4 mg total) by mouth every 6 (six) hours as needed for nausea. 30 tablet 0  . polyethylene glycol (MIRALAX / GLYCOLAX) packet Take 17 g by mouth 2 (two) times  daily. (Patient taking differently: Take 17 g by mouth daily. ) 14 each 0  . rivaroxaban (XARELTO) 20 MG TABS tablet Take 1 tablet (20 mg total) by mouth daily with supper. 90 tablet 3  . rosuvastatin (CRESTOR) 20 MG tablet TAKE ONE TABLET BY MOUTH DAILY 90 tablet 2  . Spacer/Aero-Holding Chambers DEVI 1 Device by Does not apply route as directed. 1 each 0  . triamterene-hydrochlorothiazide (DYAZIDE) 37.5-25 MG capsule Take 1 capsule by mouth daily.     No current facility-administered medications for this visit.    Allergies:   Lipitor [atorvastatin calcium], Penicillins, Sulfa antibiotics, and Tamiflu [oseltamivir phosphate]    Social History:  The patient  reports that she quit  smoking about 55 years ago. She has a 3.00 pack-year smoking history. She has never used smokeless tobacco. She reports current alcohol use. She reports that she does not use drugs.   Family History:  The patient's family history includes Basal cell carcinoma in her father; Bladder Cancer in her cousin; Breast cancer (age of onset: 37) in her mother; Breast cancer (age of onset: 52) in her maternal aunt and maternal aunt; Breast cancer (age of onset: 71) in her paternal aunt; CAD in her father; Cancer in an other family member; Dementia in her sister; Heart disease in her father; Lung cancer in her mother; Other (age of onset: 80) in her daughter; Ovarian cysts in her maternal grandmother and sister; Polycystic ovary syndrome in her daughter.    ROS:  Please see the history of present illness.   Otherwise, review of systems are positive for none.   All other systems are reviewed and negative.    PHYSICAL EXAM: VS:  BP (!) 153/95   Pulse 66   Temp (!) 97.1 F (36.2 C)   Ht 4' 11.75" (1.518 m)   Wt 95 lb (43.1 kg)   SpO2 98%   BMI 18.71 kg/m  , BMI Body mass index is 18.71 kg/m. GENERAL:  Well appearing HEENT: Pupils equal round and reactive, fundi not visualized, oral mucosa unremarkable NECK:  No jugular  venous distention, waveform within normal limits, carotid upstroke brisk and symmetric, no bruits LUNGS:  Clear to auscultation bilaterally HEART:  RRR.  PMI not displaced or sustained,S1 and S2 within normal limits, no S3, no S4, no clicks, no rubs, II/VI systolic murmur at the LUSB ABD:  Flat, positive bowel sounds normal in frequency in pitch, no bruits, no rebound, no guarding, no midline pulsatile mass, no hepatomegaly, no splenomegaly EXT:  2 plus pulses throughout, no edema, no cyanosis no clubbing SKIN:  No rashes no nodules NEURO:  Cranial nerves II through XII grossly intact, motor grossly intact throughout PSYCH:  Cognitively intact, oriented to person place and time I have no edema on Cerner she did she think there she should be read so sorry  EKG:  EKG is ordered today. The ekg ordered 01/01/18 demonstrates sinus rhythm.  Rate 66 bpm.   03/15/19: Sinus rhythm.  Rate 66 bpm.   Echo 11/26/2017: Study Conclusions  - Left ventricle: The cavity size was normal. There was mild focal   basal hypertrophy of the septum. Systolic function was normal.   The estimated ejection fraction was in the range of 55% to 60%.   Wall motion was normal; there were no regional wall motion   abnormalities. Doppler parameters are consistent with abnormal   left ventricular relaxation (grade 1 diastolic dysfunction). - Aortic valve: There was mild regurgitation.  ETT 01/07/18:  Blood pressure demonstrated a hypotensive response to exercise.  There was no ST segment deviation noted during stress.  No T wave inversion was noted during stress.  Poor exercise capacity.  Negative, adequate stress test.   Coronary CT-A 01/18/18: IMPRESSION: 1. Coronary artery calcium score 667 Agatston units. This places the patient in the 90th percentile for age and gender, suggesting high risk for future cardiac events.  2. Suspect 50% mid LCx stenosis and moderate (50-75%) proximal LAD stenosis. Will send  for CT FFR.  FINDINGS: FFR 0.64 mid LAD  FFR 0.94 mid RCA  FFR 0.9 mid LCx  IMPRESSION: Hemodynamically significant proximal LAD stenosis.   Recent Labs: 04/19/2018: Magnesium 2.4 12/23/2018: ALT 14; BUN  18; Creatinine 0.98; Hemoglobin 13.0; Platelets 292; Potassium 3.6; Sodium 139   11/23/2017: Total cholesterol 200, triglycerides 151, HDL 57, LDL 113 Sodium 142, potassium 4.6, BUN 31, creatinine 1.3 AST 15, ALT 12    Lipid Panel    Component Value Date/Time   CHOL 160 09/06/2018 0950   TRIG 126 09/06/2018 0950   HDL 62 09/06/2018 0950   CHOLHDL 2.6 09/06/2018 0950   LDLCALC 73 09/06/2018 0950      Wt Readings from Last 3 Encounters:  03/15/19 95 lb (43.1 kg)  02/22/19 94 lb 6.4 oz (42.8 kg)  12/23/18 93 lb 14.4 oz (42.6 kg)      ASSESSMENT AND PLAN:  # Exertional dyspnea: # Bronchiectasis:  Improved with Breo.  This was attributable to bronchiectasis.  # CAD:  # Hyperlipidemia: Continue apirin and rosuvastatin.  LDL goal <70/\.   # Myeloproliferative disorder: # Prior mesenteric thrombus: On aspirin and Xarelto for secondary prevention of thromboembolic disease per Dr. Beryle Beams.   # Hypertension: BP is not controlled-controlled today but she thinks it has been controlled at home.  She is going to track her blood pressures.  Virtual f/u in 1-2 months.    Current medicines are reviewed at length with the patient today.  The patient does not have concerns regarding medicines.  The following changes have been made:  no change  Labs/ tests ordered today include:   No orders of the defined types were placed in this encounter.    Disposition:   FU with Lilianna Case C. Oval Linsey, MD, [facility] in 1 year.  Virtual for BP in 4-6 weeks.     Signed, Jahden Schara C. Oval Linsey, MD, [facility]  03/15/2019 8:33 AM    [facility]

## 2019-03-15 NOTE — Patient Instructions (Signed)
Medication Instructions:  Your physician recommends that you continue on your current medications as directed. Please refer to the Current Medication list given to you today.  *If you need a refill on your cardiac medications before your next appointment, please call your pharmacy*  Lab Work: NONE   Testing/Procedures: NONE  Follow-Up: At [facility], you and your health needs are our priority.  As part of our continuing mission to provide you with exceptional heart care, we have created designated Provider Care Teams.  These Care Teams include your primary Cardiologist (physician) and Advanced Practice Providers (APPs -  Physician Assistants and Nurse Practitioners) who all work together to provide you with the care you need, when you need it.  Your next appointment:   12 month(s) You will receive a reminder letter in the mail two months in advance. If you don't receive a letter, please call our office to schedule the follow-up appointment.  The format for your next appointment:   In Person  Provider:   You may see Skeet Latch, MD or one of the following Advanced Practice Providers on your designated Care Team:    Kerin Ransom, PA-C  [place], [place]  Coletta Memos, [place]  Your physician recommends that you schedule a follow-up appointment in: VIRTUAL VISIT WITH PA IN 4-6 WEEKS   MONITOR AND LOG YOUR BLOOD PRESSURE DAILY FOR YOUR FOLLOW UP

## 2019-03-24 ENCOUNTER — Other Ambulatory Visit: Payer: Medicare Other

## 2019-03-24 ENCOUNTER — Ambulatory Visit: Payer: Medicare Other | Admitting: Hematology

## 2019-03-27 ENCOUNTER — Other Ambulatory Visit: Payer: Self-pay | Admitting: Hematology

## 2019-04-09 ENCOUNTER — Encounter: Payer: Self-pay | Admitting: Hematology

## 2019-04-09 ENCOUNTER — Other Ambulatory Visit: Payer: Self-pay | Admitting: Pulmonary Disease

## 2019-04-11 ENCOUNTER — Other Ambulatory Visit: Payer: Self-pay | Admitting: *Deleted

## 2019-04-11 DIAGNOSIS — D473 Essential (hemorrhagic) thrombocythemia: Secondary | ICD-10-CM

## 2019-04-11 MED ORDER — RIVAROXABAN 20 MG PO TABS: 20.0000 mg | ORAL_TABLET | Freq: Every day | ORAL | 1 refills | Status: DC

## 2019-04-11 NOTE — Telephone Encounter (Signed)
Via MyChart: Requested 3 month refill of Xarelto 20 mg sent to [facility] mail order pharmacy Dr. Irene Limbo authorized refill as requested

## 2019-04-12 ENCOUNTER — Ambulatory Visit: Payer: Medicare Other | Attending: Internal Medicine

## 2019-04-12 ENCOUNTER — Ambulatory Visit: Payer: Medicare Other

## 2019-04-12 DIAGNOSIS — Z23 Encounter for immunization: Secondary | ICD-10-CM | POA: Diagnosis not present

## 2019-04-12 NOTE — Progress Notes (Signed)
   Covid-19 Vaccination Clinic  Name:  Gwendolyn Williams    MRN: [id number] DOB: 03/14/1941  04/12/2019  Gwendolyn Williams was observed post Covid-19 immunization for 30 minutes based on pre-vaccination screening without incidence. She was provided with Vaccine Information Sheet and instruction to access the V-Safe system.   Gwendolyn Williams was instructed to call 911 with any severe reactions post vaccine: Marland Kitchen Difficulty breathing  . Swelling of your face and throat  . A fast heartbeat  . A bad rash all over your body  . Dizziness and weakness    Immunizations Administered    Name Date Dose VIS Date Route   Pfizer COVID-19 Vaccine 04/12/2019  9:53 AM 0.3 mL 03/11/2019 Intramuscular   Manufacturer: [place]   Lot: [id number]   [place]: [id number]

## 2019-04-14 ENCOUNTER — Other Ambulatory Visit: Payer: Self-pay | Admitting: Pulmonary Disease

## 2019-04-18 ENCOUNTER — Telehealth: Payer: Self-pay | Admitting: Hematology

## 2019-04-18 ENCOUNTER — Inpatient Hospital Stay: Payer: Medicare Other | Attending: Hematology

## 2019-04-18 ENCOUNTER — Inpatient Hospital Stay (HOSPITAL_BASED_OUTPATIENT_CLINIC_OR_DEPARTMENT_OTHER): Payer: Medicare Other | Admitting: Hematology

## 2019-04-18 ENCOUNTER — Other Ambulatory Visit: Payer: Self-pay

## 2019-04-18 VITALS — BP 133/93 | HR 86 | Temp 97.8°F | Resp 16 | Ht 59.75 in | Wt 90.0 lb

## 2019-04-18 DIAGNOSIS — R10819 Abdominal tenderness, unspecified site: Secondary | ICD-10-CM | POA: Insufficient documentation

## 2019-04-18 DIAGNOSIS — Z818 Family history of other mental and behavioral disorders: Secondary | ICD-10-CM | POA: Insufficient documentation

## 2019-04-18 DIAGNOSIS — Z828 Family history of other disabilities and chronic diseases leading to disablement, not elsewhere classified: Secondary | ICD-10-CM | POA: Diagnosis not present

## 2019-04-18 DIAGNOSIS — Z8249 Family history of ischemic heart disease and other diseases of the circulatory system: Secondary | ICD-10-CM | POA: Insufficient documentation

## 2019-04-18 DIAGNOSIS — Z79899 Other long term (current) drug therapy: Secondary | ICD-10-CM | POA: Diagnosis not present

## 2019-04-18 DIAGNOSIS — Z801 Family history of malignant neoplasm of trachea, bronchus and lung: Secondary | ICD-10-CM | POA: Insufficient documentation

## 2019-04-18 DIAGNOSIS — R0602 Shortness of breath: Secondary | ICD-10-CM | POA: Diagnosis not present

## 2019-04-18 DIAGNOSIS — Z8052 Family history of malignant neoplasm of bladder: Secondary | ICD-10-CM | POA: Diagnosis not present

## 2019-04-18 DIAGNOSIS — Z87891 Personal history of nicotine dependence: Secondary | ICD-10-CM | POA: Diagnosis not present

## 2019-04-18 DIAGNOSIS — M810 Age-related osteoporosis without current pathological fracture: Secondary | ICD-10-CM | POA: Insufficient documentation

## 2019-04-18 DIAGNOSIS — I10 Essential (primary) hypertension: Secondary | ICD-10-CM | POA: Diagnosis not present

## 2019-04-18 DIAGNOSIS — Z808 Family history of malignant neoplasm of other organs or systems: Secondary | ICD-10-CM | POA: Insufficient documentation

## 2019-04-18 DIAGNOSIS — D473 Essential (hemorrhagic) thrombocythemia: Secondary | ICD-10-CM | POA: Insufficient documentation

## 2019-04-18 DIAGNOSIS — Z853 Personal history of malignant neoplasm of breast: Secondary | ICD-10-CM | POA: Diagnosis not present

## 2019-04-18 DIAGNOSIS — Z800 Family history of malignant neoplasm of digestive organs: Secondary | ICD-10-CM | POA: Diagnosis not present

## 2019-04-18 DIAGNOSIS — Z803 Family history of malignant neoplasm of breast: Secondary | ICD-10-CM | POA: Insufficient documentation

## 2019-04-18 LAB — CBC WITH DIFFERENTIAL/PLATELET
Abs Immature Granulocytes: 0.05 10*3/uL (ref 0.00–0.07)
Basophils Absolute: 0.1 10*3/uL (ref 0.0–0.1)
Basophils Relative: 1 %
Eosinophils Absolute: 0.1 10*3/uL (ref 0.0–0.5)
Eosinophils Relative: 1 %
HCT: 39.7 % (ref 36.0–46.0)
Hemoglobin: 13.1 g/dL (ref 12.0–15.0)
Immature Granulocytes: 1 %
Lymphocytes Relative: 12 %
Lymphs Abs: 0.9 10*3/uL (ref 0.7–4.0)
MCH: 33.4 pg (ref 26.0–34.0)
MCHC: 33 g/dL (ref 30.0–36.0)
MCV: 101.3 fL — ABNORMAL HIGH (ref 80.0–100.0)
Monocytes Absolute: 0.3 10*3/uL (ref 0.1–1.0)
Monocytes Relative: 4 %
Neutro Abs: 6 10*3/uL (ref 1.7–7.7)
Neutrophils Relative %: 81 %
Platelets: 379 10*3/uL (ref 150–400)
RBC: 3.92 MIL/uL (ref 3.87–5.11)
RDW: 15.2 % (ref 11.5–15.5)
WBC: 7.4 10*3/uL (ref 4.0–10.5)
nRBC: 0 % (ref 0.0–0.2)

## 2019-04-18 LAB — CMP (CANCER CENTER ONLY)
ALT: 18 U/L (ref 0–44)
AST: 19 U/L (ref 15–41)
Albumin: 3.9 g/dL (ref 3.5–5.0)
Alkaline Phosphatase: 49 U/L (ref 38–126)
Anion gap: 9 (ref 5–15)
BUN: 19 mg/dL (ref 8–23)
CO2: 28 mmol/L (ref 22–32)
Calcium: 9.5 mg/dL (ref 8.9–10.3)
Chloride: 103 mmol/L (ref 98–111)
Creatinine: 0.93 mg/dL (ref 0.44–1.00)
GFR, Est AFR Am: >60 mL/min (ref >60)
GFR, Estimated: 60 mL/min — ABNORMAL LOW (ref >60)
Glucose, Bld: 85 mg/dL (ref 70–99)
Potassium: 3.4 mmol/L — ABNORMAL LOW (ref 3.5–5.1)
Sodium: 140 mmol/L (ref 135–145)
Total Bilirubin: 0.6 mg/dL (ref 0.3–1.2)
Total Protein: 6.6 g/dL (ref 6.5–8.1)

## 2019-04-18 NOTE — Progress Notes (Signed)
HEMATOLOGY/ONCOLOGY CLINIC NOTE  Date of Service: 04/18/2019  Patient Care Team: Gaynelle Arabian, MD as PCP - General (Family Medicine) Skeet Latch, MD as PCP - Cardiology (Cardiology) Annia Belt, MD as Consulting Physician (Oncology) Rolm Bookbinder, MD as Consulting Physician (General Surgery)  CHIEF COMPLAINTS/PURPOSE OF CONSULTATION:  Essential Thrombocytosis  HISTORY OF PRESENTING ILLNESS:   Gwendolyn Williams is a wonderful 79 y.o. female who has been referred to [place] by Dr. Gaynelle Arabian, and was previously under the care of Dr. 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 The pt reports that she is doing well overall.   The pt reports that she has seen Dr. Michail Sermon in GI and is concerned for esophageal spasm but does not wish to proceed with the diagnostic work up with esophageal manometry. She notes that muscle relaxants help with this occasional pain. She also follows up with Dr. Donne Hazel in surgery to determine if this related to her previous abdominal surgeries. 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  The pt notes that she does not have any other present concerns. She notes that she had SOB at the end of last year, as part of bronchiectasis, and takes Memory Dance once a day, denies needing her rescue inhaler. She denies positional elements to her SOB.  The pt notes that she was off Hyxdroxyurea during the Fall of 2019 during this work up. She then returned to '500mg'$  Hydoxyurea once a day and her PLT have normalized. She takes this in the morning with her food and denies any problems tolerating this medication.   The pt denies any concerns for bleeding at this time.  The pt continues with annual mammograms with her OBGYN Dr. Donalynn Furlong. The pt pursued maintenance Arimidex for 8.5 years, and stopped during the  evaluation of her respiratory symptoms in Fall 2019. The pt completes a bone density study every year, and notes she has stable osteopenia. She takes Vitamin D replacement and notes her levels are good.  She continues on '20mg'$  Xarelto. She continues on '81mg'$  aspirin as well.  The pt notes that she has had chronic mouth ulcers for "decades." She has tried Vitamin B complex and mouthwashes. She notes that she routinely gets mouth ulcers after her dental cleanings as well.  Most recent lab results (06/22/18) of CBC w/diff and CMP is as follows: all values are WNL except for RDW at 25.2, Potassium at 3.3, BUN at 30, Creatinine at 1.23, GFR at 43.  On review of systems, pt reports intermittent esophageal discomfort, stable breathing, good energy levels, some abdominal tenderness, chronic mouth ulcers, and denies concerns for bleeding, and any other symptoms.   Interval History:   Gwendolyn Williams returns today for management and evaluation of her Essential Thrombocytosis. We are joined today by her husband, Gwendolyn Williams.The patient's last visit with [place] was on 12/23/2018. The pt reports that she is doing well overall.  The pt reports that she has been losing weight unexpectedly. Her appetite for the last 6-4 weeks has been severely decreased. She has continued to eat as much as possible considering that she is never hungry. She recently purchased some Ensure and began using it last night. There are some family issues that are causing her a high level of stress. She has tried Valium but did not enjoy the grogginess that she noticed the next day. Pt  has been getting 5 hours of restful sleep per night. She has been walking everyday and has started reading again. She typically feels really well when she wakes in the morning but notices a drop in energy and overall mood in the evening after her daily walks.   She was recently placed on Levbid 0.375 MG BID by her GI, Dr. Ardis Hughs. Pt was having some increased dry mouth and  Phil noticed an increase in extrapyramidal effects so she is now taking it once per day. Pt states that her pain has improved with this medication. Pt denies any bleeding concerns with Xarelto but has had some mild bruising. Pt is also on 81 mg of Asprin per day.   Lab results today (04/18/19) of CBC w/diff and CMP is as follows: all values are WNL except for MCV at 101.3, Potassium at 3.4, GFR Est Non Af Am at 60.  On review of systems, pt reports stress, lack of appetite, unexpected weight loss, bruising and denies sleeplessness, bloody/black stools, other bleeding concerns, constipation, diarrhea, abdominal pain, leg swelling, mouth sores and any other symptoms.   MEDICAL HISTORY:  Past Medical History:  Diagnosis Date  . Arthritis   . Asthma    controlled with singulair  . Breast cancer ([place])    stage I left breast  . Current use of long term anticoagulation 04/21/2017  . Dyspnea, unspecified 02/24/2017   Started coincident with [place], exertional and orthostatic, normal exam. O2 sat 100%.  . Family history of breast cancer   . GERD (gastroesophageal reflux disease)   . H/O blood clots    clotting disorder since 2010  . Hematoma of arm 08/09/2012  . Hypercoagulable state, primary ([place])    JAK2 gene defect, thrombocythemia  . Hyperlipidemia   . Hypertension   . 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 Pneumonia    viral pneumonia as a kid  . PONV (postoperative nausea and vomiting)     SURGICAL HISTORY: Past Surgical History:  Procedure Laterality Date  . BREAST SURGERY     Left breast lumpectomy sentinel node biopsy  . CERVICAL SPINE SURGERY  05/23/2008  . COLECTOMY  04/11/2008   left colectomy with end colostomy due to clot  . COLOSTOMY TAKEDOWN  08/14/2008  . EYE SURGERY     bil cataracts  . HERNIA REPAIR  2011   [facility]  . INTRAVASCULAR PRESSURE WIRE/FFR STUDY N/A 01/25/2018   Procedure: INTRAVASCULAR PRESSURE WIRE/FFR STUDY;   Surgeon: Nelva Bush, MD;  Location: [facility];  Service: Cardiovascular;  Laterality: N/A;  . LEFT HEART CATH AND CORONARY ANGIOGRAPHY N/A 01/25/2018   Procedure: LEFT HEART CATH AND CORONARY ANGIOGRAPHY;  Surgeon: Nelva Bush, MD;  Location: [facility];  Service: Cardiovascular;  Laterality: N/A;  . LUMBAR [place] SURGERY  2006  . TOTAL HIP ARTHROPLASTY Left 10/07/2016   Procedure: LEFT TOTAL HIP ARTHROPLASTY ANTERIOR APPROACH;  Surgeon: Paralee Cancel, MD;  Location: [facility];  Service: Orthopedics;  Laterality: Left;  70 mins    SOCIAL HISTORY: Social History   Socioeconomic History  . Marital status: Married    Spouse name: Not on file  . Number of children: Not on file  . Years of education: Not on file  . Highest education level: Not on file  Occupational History  . Not on file  Tobacco Use  . Smoking status: Former Smoker    Packs/day: 0.50    Years:  6.00    Pack years: 3.00    Quit date: 01/06/1964    Years since quitting: 55.3  . Smokeless tobacco: Never Used  Substance and Sexual Activity  . Alcohol use: Yes    Alcohol/week: 0.0 standard drinks    Comment: Rare  . Drug use: No  . Sexual activity: Not Currently    Partners: Male    Comment: 1st intercourse 79 yo-Fewer than 5 partners  Other Topics Concern  . Not on file  Social History Narrative  . Not on file   Social Determinants of Health   Financial Resource Strain:   . Difficulty of Paying Living Expenses: Not on file  Food Insecurity:   . Worried About Charity fundraiser in the Last Year: Not on file  . Ran Out of Food in the Last Year: Not on file  Transportation Needs:   . Lack of Transportation (Medical): Not on file  . Lack of Transportation (Non-Medical): Not on file  Physical Activity:   . Days of Exercise per Week: Not on file  . Minutes of Exercise per Session: Not on file  Stress:   . Feeling of Stress : Not on file  Social Connections:   . Frequency of  Communication with Friends and Family: Not on file  . Frequency of Social Gatherings with Friends and Family: Not on file  . Attends Religious Services: Not on file  . Active Member of Clubs or Organizations: Not on file  . Attends Archivist Meetings: Not on file  . Marital Status: Not on file  Intimate Partner Violence:   . Fear of Current or Ex-Partner: Not on file  . Emotionally Abused: Not on file  . Physically Abused: Not on file  . Sexually Abused: Not on file    FAMILY HISTORY: Family History  Problem Relation Age of Onset  . Breast cancer Mother 34  . Lung cancer Mother   . Heart disease Father   . Basal cell carcinoma Father        x2  . CAD Father   . Ovarian cysts Sister   . Dementia Sister   . Breast cancer Maternal Aunt 62  . Breast cancer Paternal Aunt 99  . Ovarian cysts Maternal Grandmother        possibly had ovarian cancer as well  . Breast cancer Maternal Aunt 74  . Cancer Other        either pancreatic or colon  . Other Daughter 51       breast lumpectomy- complex sclerosing lesion  . Polycystic ovary syndrome Daughter   . Bladder Cancer Cousin     ALLERGIES:  is allergic to lipitor [atorvastatin calcium]; penicillins; sulfa antibiotics; and tamiflu [oseltamivir phosphate].  MEDICATIONS:  Current Outpatient Medications  Medication Sig Dispense Refill  . albuterol (PROVENTIL HFA;VENTOLIN HFA) 108 (90 Base) MCG/ACT inhaler Inhale 2 puffs into the lungs every 6 (six) hours as needed for wheezing or shortness of breath. 1 Inhaler 2  . amLODipine (NORVASC) 5 MG tablet Take 5 mg by mouth daily.    Marland Kitchen aspirin 81 MG tablet Take 81 mg by mouth daily.      . Cholecalciferol (VITAMIN D) 2000 units CAPS Take 4,000 Units by mouth daily.     Marland Kitchen docusate sodium (COLACE) 100 MG capsule Take 1 capsule (100 mg total) by mouth 2 (two) times daily. (Patient taking differently: Take 100 mg by mouth daily. ) 10 capsule 0  . Fluticasone Furoate-Vilanterol (BREO  ELLIPTA IN) Inhale into the lungs daily.    . folic acid (FOLVITE) 1 MG tablet Take 1 mg by mouth daily.    . hydroxyurea (HYDREA) 500 MG capsule TAKE ONE CAPSULE BY MOUTH DAILY WITH FOOD TO MINIMIZE GI SIDE EFFECTS 90 capsule 0  . hyoscyamine (LEVBID) 0.375 MG 12 hr tablet Take 1 tablet (0.375 mg total) by mouth 2 (two) times daily. 60 tablet 11  . ondansetron (ZOFRAN) 4 MG tablet Take 1 tablet (4 mg total) by mouth every 6 (six) hours as needed for nausea. 30 tablet 0  . polyethylene glycol (MIRALAX / GLYCOLAX) packet Take 17 g by mouth 2 (two) times daily. (Patient taking differently: Take 17 g by mouth daily. ) 14 each 0  . rivaroxaban (XARELTO) 20 MG TABS tablet Take 1 tablet (20 mg total) by mouth daily with supper. 90 tablet 1  . rosuvastatin (CRESTOR) 20 MG tablet TAKE ONE TABLET BY MOUTH DAILY 90 tablet 2  . Spacer/Aero-Holding Chambers DEVI 1 Device by Does not apply route as directed. 1 each 0  . triamterene-hydrochlorothiazide (DYAZIDE) 37.5-25 MG capsule Take 1 capsule by mouth daily.     No current facility-administered medications for this visit.    REVIEW OF SYSTEMS:   A 10+ POINT REVIEW OF SYSTEMS WAS OBTAINED including neurology, dermatology, psychiatry, cardiac, respiratory, lymph, extremities, GI, GU, Musculoskeletal, constitutional, breasts, reproductive, HEENT.  All pertinent positives are noted in the HPI.  All others are negative.   PHYSICAL EXAMINATION: ECOG FS:2 - Symptomatic, <50% confined to bed  Vitals:   04/18/19 1110  BP: (!) 133/93  Pulse: 86  Resp: 16  Temp: 97.8 F (36.6 C)  SpO2: 100%   Wt Readings from Last 3 Encounters:  04/18/19 90 lb (40.8 kg)  03/15/19 95 lb (43.1 kg)  02/22/19 94 lb 6.4 oz (42.8 kg)   Body mass index is 17.72 kg/m.    GENERAL:alert, in no acute distress and comfortable SKIN: no acute rashes, no significant lesions EYES: conjunctiva are pink and non-injected, sclera anicteric OROPHARYNX: MMM, no exudates, no  oropharyngeal erythema or ulceration NECK: supple, no JVD LYMPH:  no palpable lymphadenopathy in the axillary or inguinal regions. 1 cm right submental lymph node  LUNGS: clear to auscultation b/l with normal respiratory effort HEART: regular rate & rhythm ABDOMEN:  normoactive bowel sounds , non tender, not distended. No palpable hepatosplenomegaly.  Extremity: no pedal edema PSYCH: alert & oriented x 3 with fluent speech NEURO: no focal motor/sensory deficits  LABORATORY DATA:  I have reviewed the data as listed  . CBC Latest Ref Rng & Units 04/18/2019 12/23/2018 09/22/2018  WBC 4.0 - 10.5 K/uL 7.4 6.2 6.5  Hemoglobin 12.0 - 15.0 g/dL 13.1 13.0 12.5  Hematocrit 36.0 - 46.0 % 39.7 41.0 39.7  Platelets 150 - 400 K/uL 379 292 377    . CMP Latest Ref Rng & Units 04/18/2019 12/23/2018 09/22/2018  Glucose 70 - 99 mg/dL 85 85 65(L)  BUN 8 - 23 mg/dL '19 18 22  '$ Creatinine 0.44 - 1.00 mg/dL 0.93 0.98 0.99  Sodium 135 - 145 mmol/L 140 139 143  Potassium 3.5 - 5.1 mmol/L 3.4(L) 3.6 3.8  Chloride 98 - 111 mmol/L 103 103 103  CO2 22 - 32 mmol/L '28 29 29  '$ Calcium 8.9 - 10.3 mg/dL 9.5 9.9 9.7  Total Protein 6.5 - 8.1 g/dL 6.6 6.7 6.9  Total Bilirubin 0.3 - 1.2 mg/dL 0.6 0.5 0.6  Alkaline Phos 38 - 126 U/L 49 48  51  AST 15 - 41 U/L '19 18 19  '$ ALT 0 - 44 U/L '18 14 15   '$ 06/07/08 JAK2:    RADIOGRAPHIC STUDIES: I have personally reviewed the radiological images as listed and agreed with the findings in the report. No results found.  ASSESSMENT & PLAN:  79 y.o. female with  1. 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  2. 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 S/p Lumpectomy, Radiation, 4 cycles of Cytoxan and Taxotere. Completed maintenance Arimidex until Fall 2019 Continues with  annual mammograms with OBGYN Dr. Donalynn Furlong.  PLAN: -Discussed pt labwork today, 04/18/19;blood counts and blood chemistries are steady  -PLT are stable at 379K  -Will keep an eye on right submental lymph node -The pt has no prohibitive toxicities from continuing '500mg'$  Hydroxyurea daily, at this time. -Will continue with labs every 3 months, will space it out to every 6 months if labs are done in between by PCP or other provider.  -Not unreasonable to move to a preventive dose of Xarelto (10 mg) due to recent weight loss - pt is currently weighs 40.8 kg -Will continue at 20 mg Xarelto daily and reconsider in 3 months, sooner if pt continues to lose weight -Reviewed watching weight loss to determine if Hydroxyurea and Xarelto dosage is appropriate. -Recommend pt eat as much as possible, continue using high-calorie, high-protein Ensure -Recommended pt to de-stress as much as possible -Will see back in 3 months with labs   FOLLOW UP: RTC with Dr Irene Limbo with labs in 3 months   The total time spent in the appt was 30 minutes and more than 50% was on counseling and direct patient cares.  All of the patient's questions were answered with apparent satisfaction. The patient knows to call the clinic with any problems, questions or concerns.    Sullivan Lone MD [place] AAHIVMS [facility] Hematology/Oncology Physician [facility]  (Office):       [phone] (Work cell):  [phone] (Fax):           [phone]  04/18/2019 12:18 PM  I, Yevette Edwards, am acting as a scribe for Dr. Sullivan Lone.   .I have reviewed the above documentation for accuracy and completeness, and I agree with the above. Brunetta Genera MD

## 2019-04-18 NOTE — Telephone Encounter (Signed)
Scheduled appt per 1/18 los.  Sent a message to HIM poo to get a calendar mailed out.

## 2019-04-19 ENCOUNTER — Telehealth: Payer: Self-pay

## 2019-04-19 ENCOUNTER — Telehealth (INDEPENDENT_AMBULATORY_CARE_PROVIDER_SITE_OTHER): Payer: Medicare Other | Admitting: Cardiology

## 2019-04-19 ENCOUNTER — Encounter: Payer: Self-pay | Admitting: Cardiology

## 2019-04-19 VITALS — BP 120/80 | Ht 59.0 in | Wt 85.0 lb

## 2019-04-19 DIAGNOSIS — I10 Essential (primary) hypertension: Secondary | ICD-10-CM | POA: Diagnosis not present

## 2019-04-19 DIAGNOSIS — K55069 Acute infarction of intestine, part and extent unspecified: Secondary | ICD-10-CM

## 2019-04-19 DIAGNOSIS — Z7901 Long term (current) use of anticoagulants: Secondary | ICD-10-CM

## 2019-04-19 NOTE — Patient Instructions (Signed)
Medication Instructions:  Your physician recommends that you continue on your current medications as directed. Please refer to the Current Medication list given to you today. *If you need a refill on your cardiac medications before your next appointment, please call your pharmacy*  Lab Work: None  If you have labs (blood work) drawn today and your tests are completely normal, you will receive your results only by: Marland Kitchen MyChart Message (if you have MyChart) OR . A paper copy in the mail If you have any lab test that is abnormal or we need to change your treatment, we will call you to review the results.  Testing/Procedures: None   Follow-Up: At [facility], you and your health needs are our priority.  As part of our continuing mission to provide you with exceptional heart care, we have created designated Provider Care Teams.  These Care Teams include your primary Cardiologist (physician) and Advanced Practice Providers (APPs -  Physician Assistants and Nurse Practitioners) who all work together to provide you with the care you need, when you need it.  Your next appointment:   6 month(s)  The format for your next appointment:   In Person  Provider:   Skeet Latch, MD  Other Instructions The schedulers will mail you a letter 2 advising you to contact the office to schedule your next appointment.

## 2019-04-19 NOTE — Telephone Encounter (Signed)

## 2019-04-19 NOTE — Progress Notes (Signed)
Virtual Visit via Telephone Note   This visit type was conducted due to national recommendations for restrictions regarding the COVID-19 Pandemic (e.g. social distancing) in an effort to limit this patient's exposure and mitigate transmission in our community.  Due to her co-morbid illnesses, this patient is at least at moderate risk for complications without adequate follow up.  This format is felt to be most appropriate for this patient at this time.  The patient did not have access to video technology/had technical difficulties with video requiring transitioning to audio format only (telephone).  All issues noted in this document were discussed and addressed.  No physical exam could be performed with this format.  Please refer to the patient's chart for her  consent to telehealth for [facility].   Date:  04/19/2019   ID:  Gwendolyn Williams, DOB October 04, 1940, MRN [id number]  Patient Location: Home Provider Location: Home  PCP:  Gaynelle Arabian, MD  Cardiologist:  Skeet Latch, MD  Electrophysiologist:  None   Evaluation Performed:  Follow-Up Visit  Chief Complaint:  none  History of Present Illness:    Gwendolyn Williams is a 79 y.o. female with non obstructive CAD and HTN followed by Dr Oval Linsey.  The patient saw Dr. Oval Linsey 03/15/2019.  Her amlodipine has recently been increased for hypertension.  She was contacted today for follow-up.  From July to December her blood pressure average 134/86.  From December to early January when her amlodipine was increased it was 130/88.  Since January 3 to present her blood pressures average 120/80.  She has no other cardiac issues at this time.  The patient does not have symptoms concerning for COVID-19 infection (fever, chills, cough, or new shortness of breath).    Past Medical History:  Diagnosis Date  . Arthritis   . Asthma    controlled with singulair  . Breast cancer ([place])    stage I left breast  . Current use of long term  anticoagulation 04/21/2017  . Dyspnea, unspecified 02/24/2017   Started coincident with Hydrea, exertional and orthostatic, normal exam. O2 sat 100%.  . Family history of breast cancer   . GERD (gastroesophageal reflux disease)   . H/O blood clots    clotting disorder since 2010  . Hematoma of arm 08/09/2012  . 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 Hyperlipidemia   . Hypertension   . 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 Pneumonia    viral pneumonia as a kid  . PONV (postoperative nausea and vomiting)    Past Surgical History:  Procedure Laterality Date  . BREAST SURGERY     Left breast lumpectomy sentinel node biopsy  . CERVICAL SPINE SURGERY  05/23/2008  . COLECTOMY  04/11/2008   left colectomy with end colostomy due to clot  . COLOSTOMY TAKEDOWN  08/14/2008  . EYE SURGERY     bil cataracts  . HERNIA REPAIR  2011   [facility]  . INTRAVASCULAR PRESSURE WIRE/FFR STUDY N/A 01/25/2018   Procedure: INTRAVASCULAR PRESSURE WIRE/FFR STUDY;  Surgeon: Nelva Bush, MD;  Location: [place] [facility];  Service: Cardiovascular;  Laterality: N/A;  . LEFT HEART CATH AND CORONARY ANGIOGRAPHY N/A 01/25/2018   Procedure: LEFT HEART CATH AND CORONARY ANGIOGRAPHY;  Surgeon: Nelva Bush, MD;  Location: [place] [facility];  Service: Cardiovascular;  Laterality: N/A;  . LUMBAR [place] SURGERY  2006  . TOTAL HIP ARTHROPLASTY  Left 10/07/2016   Procedure: LEFT TOTAL HIP ARTHROPLASTY ANTERIOR APPROACH;  Surgeon: Paralee Cancel, MD;  Location: [facility];  Service: Orthopedics;  Laterality: Left;  70 mins     Current Meds  Medication Sig  . albuterol (PROVENTIL HFA;VENTOLIN HFA) 108 (90 Base) MCG/ACT inhaler Inhale 2 puffs into the lungs every 6 (six) hours as needed for wheezing or shortness of breath.  Marland Kitchen amLODipine (NORVASC) 5 MG tablet Take 5 mg by mouth daily.  Marland Kitchen aspirin 81 MG tablet Take 81 mg by mouth daily.    .  Cholecalciferol (VITAMIN D) 2000 units CAPS Take 4,000 Units by mouth daily.   Marland Kitchen docusate sodium (COLACE) 100 MG capsule Take 1 capsule (100 mg total) by mouth 2 (two) times daily. (Patient taking differently: Take 100 mg by mouth daily. )  . Fluticasone Furoate-Vilanterol (BREO ELLIPTA IN) Inhale into the lungs daily.  . folic acid (FOLVITE) 1 MG tablet Take 1 mg by mouth daily.  . hydroxyurea (HYDREA) 500 MG capsule TAKE ONE CAPSULE BY MOUTH DAILY WITH FOOD TO MINIMIZE GI SIDE EFFECTS  . hyoscyamine (LEVBID) 0.375 MG 12 hr tablet Take 1 tablet (0.375 mg total) by mouth 2 (two) times daily.  . ondansetron (ZOFRAN) 4 MG tablet Take 1 tablet (4 mg total) by mouth every 6 (six) hours as needed for nausea.  . polyethylene glycol (MIRALAX / GLYCOLAX) packet Take 17 g by mouth 2 (two) times daily. (Patient taking differently: Take 17 g by mouth daily. )  . rivaroxaban (XARELTO) 20 MG TABS tablet Take 1 tablet (20 mg total) by mouth daily with supper.  . rosuvastatin (CRESTOR) 20 MG tablet TAKE ONE TABLET BY MOUTH DAILY  . Spacer/Aero-Holding Chambers DEVI 1 Device by Does not apply route as directed.  . triamterene-hydrochlorothiazide (DYAZIDE) 37.5-25 MG capsule Take 1 capsule by mouth daily.     Allergies:   Lipitor [atorvastatin calcium], Penicillins, Sulfa antibiotics, and Tamiflu [oseltamivir phosphate]   Social History   Tobacco Use  . Smoking status: Former Smoker    Packs/day: 0.50    Years: 6.00    Pack years: 3.00    Quit date: 01/06/1964    Years since quitting: 55.3  . Smokeless tobacco: Never Used  Substance Use Topics  . Alcohol use: Yes    Alcohol/week: 0.0 standard drinks    Comment: Rare  . Drug use: No     Family Hx: The patient's family history includes Basal cell carcinoma in her father; Bladder Cancer in her cousin; Breast cancer (age of onset: 51) in her mother; Breast cancer (age of onset: 69) in her maternal aunt and maternal aunt; Breast cancer (age of onset: 48)  in her paternal aunt; CAD in her father; Cancer in an other family member; Dementia in her sister; Heart disease in her father; Lung cancer in her mother; Other (age of onset: 65) in her daughter; Ovarian cysts in her maternal grandmother and sister; Polycystic ovary syndrome in her daughter.  ROS:   Please see the history of present illness.    All other systems reviewed and are negative.   Prior CV studies:   The following studies were reviewed today: Cath Oct 2019  Labs/Other Tests and Data Reviewed:    EKG:  No ECG reviewed.  Recent Labs: 04/19/2018: Magnesium 2.4 04/18/2019: ALT 18; BUN 19; Creatinine 0.93; Hemoglobin 13.1; Platelets 379; Potassium 3.4; Sodium 140   Recent Lipid Panel Lab Results  Component Value Date/Time   CHOL 160 09/06/2018 09:50 AM  TRIG 126 09/06/2018 09:50 AM   HDL 62 09/06/2018 09:50 AM   CHOLHDL 2.6 09/06/2018 09:50 AM   LDLCALC 73 09/06/2018 09:50 AM    Wt Readings from Last 3 Encounters:  04/19/19 85 lb (38.6 kg)  04/18/19 90 lb (40.8 kg)  03/15/19 95 lb (43.1 kg)     Objective:    Vital Signs:  BP 120/80   Ht 4\' 11"  (1.499 m)   Wt 85 lb (38.6 kg)   BMI 17.17 kg/m    VITAL SIGNS:  reviewed  ASSESSMENT & PLAN:    HTN- Improved on increased Amlodipine  Non obstructive CAD- cath Oct 2019 after abnormal coronary CTA  Essential thrombocytosis- Followed by Dr Irene Limbo.  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  Plan:  Same Rx- OV 6 months   COVID-19 Education: The signs and symptoms of COVID-19 were discussed with the patient and how to seek care for testing (follow up with PCP or arrange E-visit).  The importance of social distancing was discussed today.  Time:   Today, I have spent 10 minutes with the patient with telehealth technology discussing the above problems.     Medication Adjustments/Labs and Tests Ordered: Current medicines are reviewed at length with the patient today.  Concerns regarding medicines are  outlined above.   Tests Ordered: No orders of the defined types were placed in this encounter.   Medication Changes: No orders of the defined types were placed in this encounter.   Follow Up:  In Person Dr Oval Linsey- 6 months  Signed, Kerin Ransom, PA-C  04/19/2019 10:58 AM    [place]

## 2019-04-21 ENCOUNTER — Telehealth: Payer: Self-pay | Admitting: Pulmonary Disease

## 2019-04-21 NOTE — Telephone Encounter (Signed)
Spoke with the pt and notified that yes she does need an ov for her next visit  Appt scheduled for 05/17/18  She requested this date

## 2019-04-22 ENCOUNTER — Telehealth: Payer: Self-pay | Admitting: Pulmonary Disease

## 2019-04-22 MED ORDER — FLUTICASONE FUROATE-VILANTEROL 200-25 MCG/INH IN AEPB: 1.0000 | INHALATION_SPRAY | Freq: Every day | RESPIRATORY_TRACT | 0 refills | Status: DC

## 2019-04-22 NOTE — Telephone Encounter (Signed)
Spoke with pt and advised rx for [facility] was sent to [facility]. Nothing further is needed.

## 2019-04-27 ENCOUNTER — Encounter: Payer: Medicare Other | Admitting: Obstetrics and Gynecology

## 2019-04-30 ENCOUNTER — Ambulatory Visit: Payer: Medicare Other | Attending: Internal Medicine

## 2019-04-30 ENCOUNTER — Ambulatory Visit: Payer: Medicare Other

## 2019-04-30 DIAGNOSIS — Z23 Encounter for immunization: Secondary | ICD-10-CM | POA: Insufficient documentation

## 2019-04-30 NOTE — Progress Notes (Signed)
   Covid-19 Vaccination Clinic  Name:  ROLANDO GREGER    MRN: [id number] DOB: [birth year]  04/30/2019  Ms. Layden was observed post Covid-19 immunization for 30 minutes based on pre-vaccination screening without incidence. She was provided with Vaccine Information Sheet and instruction to access the V-Safe system.   Ms. Mcclurkin was instructed to call 911 with any severe reactions post vaccine: Marland Kitchen Difficulty breathing  . Swelling of your face and throat  . A fast heartbeat  . A bad rash all over your body  . Dizziness and weakness    Immunizations Administered    Name Date Dose VIS Date Route   Pfizer COVID-19 Vaccine 04/30/2019  1:08 PM 0.3 mL 03/11/2019 Intramuscular   Manufacturer: [place]   Lot: [id number]   [place]: [id number]

## 2019-05-12 ENCOUNTER — Other Ambulatory Visit: Payer: Self-pay

## 2019-05-12 MED ORDER — ROSUVASTATIN CALCIUM 20 MG PO TABS: 20.0000 mg | ORAL_TABLET | Freq: Every day | ORAL | 2 refills | Status: DC

## 2019-05-18 ENCOUNTER — Other Ambulatory Visit: Payer: Self-pay

## 2019-05-18 ENCOUNTER — Encounter: Payer: Self-pay | Admitting: Pulmonary Disease

## 2019-05-18 ENCOUNTER — Ambulatory Visit (INDEPENDENT_AMBULATORY_CARE_PROVIDER_SITE_OTHER): Payer: Medicare Other | Admitting: Pulmonary Disease

## 2019-05-18 VITALS — BP 124/76 | HR 73 | Temp 97.1°F | Ht 59.0 in | Wt 94.6 lb

## 2019-05-18 DIAGNOSIS — J479 Bronchiectasis, uncomplicated: Secondary | ICD-10-CM | POA: Diagnosis not present

## 2019-05-18 MED ORDER — ALBUTEROL SULFATE HFA 108 (90 BASE) MCG/ACT IN AERS: 2.0000 | INHALATION_SPRAY | Freq: Four times a day (QID) | RESPIRATORY_TRACT | 3 refills | Status: DC | PRN

## 2019-05-18 MED ORDER — FLUTICASONE FUROATE-VILANTEROL 200-25 MCG/INH IN AEPB: 1.0000 | INHALATION_SPRAY | Freq: Every day | RESPIRATORY_TRACT | 0 refills | Status: DC

## 2019-05-18 NOTE — Progress Notes (Signed)
Synopsis: Referred in 01/2018 for progressive dyspnea x 6 weeks.  PFTs with reversible obstructive defect and started on ICS/LABA.  CT imaging consistent with mild bronchiectasis.  Subjective:   PATIENT ID: Gwendolyn Williams GENDER: female DOB: [birth year], MRN: [id number]   HPI  Chief Complaint  Patient presents with  . Follow-up    breo-needs refills   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   Reviewed last visit on 03/12/18 - patient followed by me for bronchiectasis and required increase in ICS inhaler use for bronchiectasis management. Since then, she has not been seen in the office (due to the pandemic) and telephone encounters encouraged patient for in-clinic visit to continue bronchodilator refills.   Since our last visit, she has been compliant and well-controlled on Breo 200. Rarely uses albuterol. She has occasional shortness of breath but only occurs with severe exertion. She walks daily for 30 minutes without issue. Denies cough, wheezing or chest pain.   Social History: Minimal smoking history. 6 pack years. Quit >50 years ago. Husband is a Nurse, learning disability with history of childhood asthma, nasal polyps and eczema.  Environmental exposures: None  I have personally reviewed patient's past medical/family/social history/allergies/current medications. Past Medical History:  Diagnosis Date  . Arthritis   . Asthma    controlled with singulair  . Breast cancer ([place])    stage I left breast  . Current use of long term anticoagulation 04/21/2017  . Dyspnea, unspecified 02/24/2017   Started coincident with Hydrea, exertional and orthostatic, normal exam. O2 sat 100%.  . Family history of breast cancer   . GERD (gastroesophageal  reflux disease)   . H/O blood clots    clotting disorder since 2010  . Hematoma of arm 08/09/2012  . Hypercoagulable state, primary ([place])    JAK2 gene defect, thrombocythemia  . Hyperlipidemia   . Hypertension   . 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 Pneumonia    viral pneumonia as a kid  . PONV (postoperative nausea and vomiting)      Family History  Problem Relation Age of Onset  . Breast cancer Mother 20  . Lung cancer Mother   . Heart disease Father   . Basal cell carcinoma Father        x2  . CAD Father   . Ovarian cysts Sister   . Dementia Sister   . Breast cancer Maternal Aunt 27  . Breast cancer Paternal Aunt 37  . Ovarian cysts Maternal Grandmother        possibly had ovarian cancer as well  . Breast cancer Maternal Aunt 23  . Cancer Other        either pancreatic or colon  . Other Daughter 69       breast lumpectomy- complex sclerosing lesion  . Polycystic ovary syndrome Daughter   . Bladder Cancer Cousin      Social History   Occupational History  . Not on file  Tobacco Use  . Smoking status: Former Smoker    Packs/day: 0.50    Years: 6.00    Pack years: 3.00    Quit date: 01/06/1964    Years since quitting: 55.4  . Smokeless tobacco: Never Used  Substance and Sexual Activity  . Alcohol use: Yes  Alcohol/week: 0.0 standard drinks    Comment: Rare  . Drug use: No  . Sexual activity: Not Currently    Partners: Male    Comment: 1st intercourse 78 yo-Fewer than 5 partners    Allergies  Allergen Reactions  . Lipitor [Atorvastatin Calcium]     Weakness in legs   . Penicillins Hives    Has patient had a PCN reaction causing immediate rash, facial/tongue/throat swelling, SOB or lightheadedness with hypotension: Unknown Has patient had a PCN reaction causing severe rash involving mucus membranes or skin necrosis: Unknown Has patient had a PCN reaction that required hospitalization: No Has patient  had a PCN reaction occurring within the last 10 years: No If all of the above answers are "NO", then may proceed with Cephalosporin use.   . Sulfa Antibiotics Other (See Comments)    unknown  . Tamiflu [Oseltamivir Phosphate] Nausea And Vomiting     Outpatient Medications Prior to Visit  Medication Sig Dispense Refill  . albuterol (PROVENTIL HFA;VENTOLIN HFA) 108 (90 Base) MCG/ACT inhaler Inhale 2 puffs into the lungs every 6 (six) hours as needed for wheezing or shortness of breath. 1 Inhaler 2  . amLODipine (NORVASC) 5 MG tablet Take 5 mg by mouth daily.    Marland Kitchen aspirin 81 MG tablet Take 81 mg by mouth daily.      . Cholecalciferol (VITAMIN D) 2000 units CAPS Take 4,000 Units by mouth daily.     Marland Kitchen docusate sodium (COLACE) 100 MG capsule Take 1 capsule (100 mg total) by mouth 2 (two) times daily. (Patient taking differently: Take 100 mg by mouth daily. ) 10 capsule 0  . Fluticasone Furoate-Vilanterol (BREO ELLIPTA IN) Inhale into the lungs daily.    . fluticasone furoate-vilanterol (BREO ELLIPTA) 200-25 MCG/INH AEPB Inhale 1 puff into the lungs daily. 99991111 each 0  . folic acid (FOLVITE) 1 MG tablet Take 1 mg by mouth daily.    . hydroxyurea (HYDREA) 500 MG capsule TAKE ONE CAPSULE BY MOUTH DAILY WITH FOOD TO MINIMIZE GI SIDE EFFECTS 90 capsule 0  . hyoscyamine (LEVBID) 0.375 MG 12 hr tablet Take 1 tablet (0.375 mg total) by mouth 2 (two) times daily. 60 tablet 11  . ondansetron (ZOFRAN) 4 MG tablet Take 1 tablet (4 mg total) by mouth every 6 (six) hours as needed for nausea. 30 tablet 0  . polyethylene glycol (MIRALAX / GLYCOLAX) packet Take 17 g by mouth 2 (two) times daily. (Patient taking differently: Take 17 g by mouth daily. ) 14 each 0  . rivaroxaban (XARELTO) 20 MG TABS tablet Take 1 tablet (20 mg total) by mouth daily with supper. 90 tablet 1  . rosuvastatin (CRESTOR) 20 MG tablet Take 1 tablet (20 mg total) by mouth daily. 90 tablet 2  . Spacer/Aero-Holding Chambers DEVI 1 Device by  Does not apply route as directed. 1 each 0  . triamterene-hydrochlorothiazide (DYAZIDE) 37.5-25 MG capsule Take 1 capsule by mouth daily.     No facility-administered medications prior to visit.   Review of Systems  Constitutional: Negative for chills, diaphoresis, fever, malaise/fatigue and weight loss.  HENT: Negative for congestion.   Respiratory: Positive for shortness of breath. Negative for cough, hemoptysis, sputum production and wheezing.   Cardiovascular: Negative for chest pain, palpitations and leg swelling.   Objective:    Vitals:   05/18/19 1138  BP: 124/76  Pulse: 73  Temp: (!) 97.1 F (36.2 C)  TempSrc: Temporal  SpO2: 99%  Weight: 94 lb 9.6 oz (42.9  kg)  Height: 4\' 11"  (1.499 m)      Physical Exam: General: Well-appearing, no acute distress HENT: [place], AT Eyes: EOMI, no scleral icterus Respiratory: Clear to auscultation bilaterally.  No crackles, wheezing or rales Cardiovascular: RRR, -M/R/G, no JVD Extremities:-Edema,-tenderness Neuro: AAO x4, CNII-XII grossly intact Skin: Intact, no rashes or bruising Psych: Normal mood, normal affect  Chest imaging: CXR 11/23/2017 No acute cardiopulmonary processes.  No infiltrate, edema or effusion.  CT chest without contrast 02/12/2018 Diffuse bronchiectasis present.  Mild hyperinflation of the lungs.  PFT: 01/19/2018-normal spirometry lung volumes and DLCO.  Significant bronchodilator effect in FVC.  Imaging, labs and test noted above have been reviewed independently by me. Assessment & Plan:   Discussion: 79 year old female who presents for follow-up of bronchiectasis. We spent time discussing her inhaler regimen including the options of stepping down in the future. We can consider a trial once winter is over to see if she tolerates a lower ICS dose of Breo.   #Bronchiectasis, well-controlled --CONTINUE Breo Ellipta 200-61mcg. Take 1 puff daily --CONTINUE albuterol inhaler 2 puffs every 4 hours as needed for  shortness of breath or wheezing --Plan for repeat PFTs prior to next visit  Follow-up in 8 months with me after PFTs   No orders of the defined types were placed in this encounter.  Meds ordered this encounter  Medications  . albuterol (VENTOLIN HFA) 108 (90 Base) MCG/ACT inhaler    Sig: Inhale 2 puffs into the lungs every 6 (six) hours as needed for wheezing or shortness of breath.    Dispense:  18 g    Refill:  3  . fluticasone furoate-vilanterol (BREO ELLIPTA) 200-25 MCG/INH AEPB    Sig: Inhale 1 puff into the lungs daily.    Dispense:  180 each    Refill:  0    Return in about 8 months (around 01/15/2020).  I have spent a total time of 31-minutes on the day of the appointment reviewing prior documentation, coordinating care and discussing medical diagnosis and plan with the patient/family. Imaging, labs and tests included in this note have been reviewed and interpreted independently by me.  Marquice Uddin Rodman Pickle, MD [facility] 05/18/2019 8:04 AM  Personal pager: [phone] If unanswered, please page CCM On-call: [phone]

## 2019-05-18 NOTE — Patient Instructions (Addendum)
#  Bronchiectasis, well-controlled --CONTINUE Breo Ellipta 200-4mcg. Take 1 puff daily --CONTINUE albuterol inhaler 2 puffs every 4 hours as needed for shortness of breath or wheezing --Plan for repeat PFTs prior to next visit  Follow-up in 8 months with me after PFTs

## 2019-05-23 MED ORDER — ALBUTEROL SULFATE HFA 108 (90 BASE) MCG/ACT IN AERS: 2.0000 | INHALATION_SPRAY | Freq: Four times a day (QID) | RESPIRATORY_TRACT | 3 refills | Status: DC | PRN

## 2019-05-24 ENCOUNTER — Other Ambulatory Visit: Payer: Self-pay

## 2019-05-25 ENCOUNTER — Ambulatory Visit (INDEPENDENT_AMBULATORY_CARE_PROVIDER_SITE_OTHER): Payer: Medicare Other | Admitting: Obstetrics and Gynecology

## 2019-05-25 ENCOUNTER — Encounter: Payer: Self-pay | Admitting: Obstetrics and Gynecology

## 2019-05-25 VITALS — BP 126/84 | Ht 59.0 in | Wt 93.0 lb

## 2019-05-25 DIAGNOSIS — Z9289 Personal history of other medical treatment: Secondary | ICD-10-CM

## 2019-05-25 DIAGNOSIS — Z01419 Encounter for gynecological examination (general) (routine) without abnormal findings: Secondary | ICD-10-CM

## 2019-05-25 DIAGNOSIS — Z853 Personal history of malignant neoplasm of breast: Secondary | ICD-10-CM | POA: Diagnosis not present

## 2019-05-25 DIAGNOSIS — M810 Age-related osteoporosis without current pathological fracture: Secondary | ICD-10-CM

## 2019-05-25 NOTE — Progress Notes (Signed)
Gwendolyn Williams September 24, 1940 [id number]  SUBJECTIVE:  79 y.o. G2P2002 female for annual routine gynecologic exam. She has no gynecologic concerns.  Has been through a hectic year with family member with recent diagnosis of dementia up in [place], she herself has been working with a cardiologist and pulmonologist regarding dyspnea.  Has been trying to gain weight as she did lose a lot of weight this last year, and has been successful with gaining a few pounds.  Current Outpatient Medications  Medication Sig Dispense Refill  . albuterol (VENTOLIN HFA) 108 (90 Base) MCG/ACT inhaler Inhale 2 puffs into the lungs every 6 (six) hours as needed for wheezing or shortness of breath. 18 g 3  . amLODipine (NORVASC) 5 MG tablet Take 5 mg by mouth daily.    Marland Kitchen aspirin 81 MG tablet Take 81 mg by mouth daily.      . Cholecalciferol (VITAMIN D) 2000 units CAPS Take 4,000 Units by mouth daily.     Marland Kitchen docusate sodium (COLACE) 100 MG capsule Take 1 capsule (100 mg total) by mouth 2 (two) times daily. (Patient taking differently: Take 100 mg by mouth daily. ) 10 capsule 0  . Fluticasone Furoate-Vilanterol (BREO ELLIPTA IN) Inhale into the lungs daily.    . fluticasone furoate-vilanterol (BREO ELLIPTA) 200-25 MCG/INH AEPB Inhale 1 puff into the lungs daily. 99991111 each 0  . folic acid (FOLVITE) 1 MG tablet Take 1 mg by mouth daily.    . hydroxyurea (HYDREA) 500 MG capsule TAKE ONE CAPSULE BY MOUTH DAILY WITH FOOD TO MINIMIZE GI SIDE EFFECTS 90 capsule 0  . hyoscyamine (LEVBID) 0.375 MG 12 hr tablet Take 1 tablet (0.375 mg total) by mouth 2 (two) times daily. (Patient taking differently: Take 0.125 mg by mouth every 12 (twelve) hours as needed. ) 60 tablet 11  . ondansetron (ZOFRAN) 4 MG tablet Take 1 tablet (4 mg total) by mouth every 6 (six) hours as needed for nausea. 30 tablet 0  . polyethylene glycol (MIRALAX / GLYCOLAX) packet Take 17 g by mouth 2 (two) times daily. (Patient taking differently: Take 17 g by mouth  daily. ) 14 each 0  . rivaroxaban (XARELTO) 20 MG TABS tablet Take 1 tablet (20 mg total) by mouth daily with supper. 90 tablet 1  . rosuvastatin (CRESTOR) 20 MG tablet Take 1 tablet (20 mg total) by mouth daily. 90 tablet 2  . Spacer/Aero-Holding Chambers DEVI 1 Device by Does not apply route as directed. 1 each 0  . triamterene-hydrochlorothiazide (DYAZIDE) 37.5-25 MG capsule Take 1 capsule by mouth daily.     No current facility-administered medications for this visit.   Allergies: Lipitor [atorvastatin calcium], Penicillins, Sulfa antibiotics, and Tamiflu [oseltamivir phosphate]  No LMP recorded. Patient is postmenopausal.  Past medical history,surgical history, problem list, medications, allergies, family history and social history were all reviewed and documented as reviewed in the EPIC chart.  ROS:  Feeling well. No dyspnea or chest pain on exertion.  No abdominal pain, change in bowel habits, black or bloody stools.  No urinary tract symptoms. GYN ROS:  no abnormal bleeding, pelvic pain or discharge, no breast pain or new or enlarging lumps on self exam. No neurological complaints.   OBJECTIVE:  Ht 4\' 11"  (1.499 m)   Wt 93 lb (42.2 kg)   BMI 18.78 kg/m  The patient appears well, alert, oriented x 3, in no distress. ENT normal.  Neck supple. No cervical or supraclavicular adenopathy or thyromegaly.  Lungs are clear, good  air entry, no wheezes, rhonchi or rales. S1 and S2 normal, no murmurs, regular rate and rhythm.  Abdomen soft without tenderness, guarding, mass or organomegaly.  Neurological is normal, no focal findings.  BREAST EXAM: breasts appear normal, no suspicious masses, no skin or nipple changes or axillary nodes, previous left breast lumpectomy scar noted  PELVIC EXAM: VULVA: normal appearing vulva with no masses, tenderness or lesions, atrophic changes noted, VAGINA: normal appearing vagina with normal color and discharge, no lesions, pediatric sized speculum  utilized, CERVIX: normal appearing cervix without discharge or lesions, UTERUS: uterus is normal size, shape, consistency and nontender, ADNEXA: normal adnexa in size, nontender and no masses, RECTAL: normal rectal, no masses  Chaperone: [name] present during the examination  ASSESSMENT:  79 y.o. [id number] here for annual gynecologic exam  PLAN:   1. Postmenopausal.  Denies vaginal bleeding or significant menopausal symptoms. 2. Pap smear 2014.  No significant history of abnormal Pap smears.  She is comfortable with not continuing screening based on age criteria. 3. Mammogram 01/2019.  History of left breast cancer.  Normal breast exam today, NED.  She is reminded to schedule an annual mammogram this year when due. 4. Colonoscopy 2016.  Recommended that she follow up at the recommended interval.   5. Osteoporosis. DEXA 12/2017.  Managed by her primary physician.  6. Health maintenance.  No labs today as she normally has these completed with her primary care provider.   Return annually or sooner, prn.  Joseph Pierini MD  05/25/19

## 2019-06-01 DIAGNOSIS — M25551 Pain in right hip: Secondary | ICD-10-CM | POA: Diagnosis not present

## 2019-06-17 DIAGNOSIS — B029 Zoster without complications: Secondary | ICD-10-CM | POA: Diagnosis not present

## 2019-06-21 DIAGNOSIS — K649 Unspecified hemorrhoids: Secondary | ICD-10-CM | POA: Diagnosis not present

## 2019-06-21 DIAGNOSIS — M810 Age-related osteoporosis without current pathological fracture: Secondary | ICD-10-CM | POA: Diagnosis not present

## 2019-06-21 DIAGNOSIS — I2510 Atherosclerotic heart disease of native coronary artery without angina pectoris: Secondary | ICD-10-CM | POA: Diagnosis not present

## 2019-06-21 DIAGNOSIS — B029 Zoster without complications: Secondary | ICD-10-CM | POA: Diagnosis not present

## 2019-06-21 DIAGNOSIS — C946 Myelodysplastic disease, not classified: Secondary | ICD-10-CM | POA: Diagnosis not present

## 2019-06-21 DIAGNOSIS — Z853 Personal history of malignant neoplasm of breast: Secondary | ICD-10-CM | POA: Diagnosis not present

## 2019-07-03 ENCOUNTER — Other Ambulatory Visit: Payer: Self-pay | Admitting: Hematology

## 2019-07-05 DIAGNOSIS — Z1389 Encounter for screening for other disorder: Secondary | ICD-10-CM | POA: Diagnosis not present

## 2019-07-05 DIAGNOSIS — I81 Portal vein thrombosis: Secondary | ICD-10-CM | POA: Diagnosis not present

## 2019-07-05 DIAGNOSIS — K219 Gastro-esophageal reflux disease without esophagitis: Secondary | ICD-10-CM | POA: Diagnosis not present

## 2019-07-05 DIAGNOSIS — B029 Zoster without complications: Secondary | ICD-10-CM | POA: Diagnosis not present

## 2019-07-05 DIAGNOSIS — I129 Hypertensive chronic kidney disease with stage 1 through stage 4 chronic kidney disease, or unspecified chronic kidney disease: Secondary | ICD-10-CM | POA: Diagnosis not present

## 2019-07-05 DIAGNOSIS — E7800 Pure hypercholesterolemia, unspecified: Secondary | ICD-10-CM | POA: Diagnosis not present

## 2019-07-05 DIAGNOSIS — J479 Bronchiectasis, uncomplicated: Secondary | ICD-10-CM | POA: Diagnosis not present

## 2019-07-05 DIAGNOSIS — M810 Age-related osteoporosis without current pathological fracture: Secondary | ICD-10-CM | POA: Diagnosis not present

## 2019-07-05 DIAGNOSIS — C946 Myelodysplastic disease, not classified: Secondary | ICD-10-CM | POA: Diagnosis not present

## 2019-07-05 DIAGNOSIS — N183 Chronic kidney disease, stage 3 unspecified: Secondary | ICD-10-CM | POA: Diagnosis not present

## 2019-07-05 DIAGNOSIS — Z0000 Encounter for general adult medical examination without abnormal findings: Secondary | ICD-10-CM | POA: Diagnosis not present

## 2019-07-05 DIAGNOSIS — K6289 Other specified diseases of anus and rectum: Secondary | ICD-10-CM | POA: Diagnosis not present

## 2019-07-12 ENCOUNTER — Telehealth: Payer: Self-pay | Admitting: *Deleted

## 2019-07-12 NOTE — Telephone Encounter (Signed)
Patient called - has an appt next week for a tooth extraction and wants to know how to manage her Xarelto.Dr. Irene Limbo informed Contacted patient with Dr. Grier Mitts response: May hold Xarelto 48h prior to dental extraction. Continue Aspirin. Restart Xarelto within in 12-24h after dental extraction if no uncontrolled bleeding. Follow Dentist's instructions related to care and bleeding.  Patient repeated instructions and verbalized understanding.

## 2019-07-21 NOTE — Progress Notes (Signed)
HEMATOLOGY/ONCOLOGY CLINIC NOTE  Date of Service: 07/21/2019  Patient Care Team: Gaynelle Arabian, MD as PCP - General (Family Medicine) Skeet Latch, MD as PCP - Cardiology (Cardiology) Annia Belt, MD as Consulting Physician (Oncology) Rolm Bookbinder, MD as Consulting Physician (General Surgery)  CHIEF COMPLAINTS/PURPOSE OF CONSULTATION:  Essential Thrombocytosis  HISTORY OF PRESENTING ILLNESS:   Gwendolyn Williams is a wonderful 79 y.o. female who has been referred to [place] by Dr. Gaynelle Arabian, and was previously under the care of Dr. 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 The pt reports that she is doing well overall.   The pt reports that she has seen Dr. Michail Sermon in GI and is concerned for esophageal spasm but does not wish to proceed with the diagnostic work up with esophageal manometry. She notes that muscle relaxants help with this occasional pain. She also follows up with Dr. Donne Hazel in surgery to determine if this related to her previous abdominal surgeries. 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  The pt notes that she does not have any other present concerns. She notes that she had SOB at the end of last year, as part of bronchiectasis, and takes Memory Dance once a day, denies needing her rescue inhaler. She denies positional elements to her SOB.  The pt notes that she was off Hyxdroxyurea during the Fall of 2019 during this work up. She then returned to '500mg'$  Hydoxyurea once a day and her PLT have normalized. She takes this in the morning with her food and denies any problems tolerating this medication.   The pt denies any concerns for bleeding at this time.  The pt continues with annual mammograms with her OBGYN Dr. Donalynn Furlong. The pt pursued maintenance Arimidex for 8.5 years, and stopped during the  evaluation of her respiratory symptoms in Fall 2019. The pt completes a bone density study every year, and notes she has stable osteopenia. She takes Vitamin D replacement and notes her levels are good.  She continues on '20mg'$  Xarelto. She continues on '81mg'$  aspirin as well.  The pt notes that she has had chronic mouth ulcers for "decades." She has tried Vitamin B complex and mouthwashes. She notes that she routinely gets mouth ulcers after her dental cleanings as well.  Most recent lab results (06/22/18) of CBC w/diff and CMP is as follows: all values are WNL except for RDW at 25.2, Potassium at 3.3, BUN at 30, Creatinine at 1.23, GFR at 43.  On review of systems, pt reports intermittent esophageal discomfort, stable breathing, good energy levels, some abdominal tenderness, chronic mouth ulcers, and denies concerns for bleeding, and any other symptoms.  Interval History:   Gwendolyn Williams returns today for management and evaluation of her Essential Thrombocytosis. The patient's last visit with [place] was on 04/18/19. The pt reports that she is doing well overall.  The pt reports she is good. She has has shingles for the past 6 week on her buttock. Pt has been on antivirals for shingles. She has had discomfort in the area and it has gotten better as the rash has gotten better. Pt had not had shingles shot in the past. She was off Xarelto while she had her dental surgery, but continued taking aspirin. Due to the shingles she was on tylenol. She has gotten both doses of COVID19 vaccine. Pt has gotten up  and had rashes and itching on arms and legs, but they go away after she uses a cream.   Lab results today (07/22/19) of CBC w/diff and CMP is as follows: all values are WNL except for RDW at 18.6, Potassium at 3.3, GFR, Est Non Af Am at 57  07/22/19 of LDH at 371  On review of systems, pt reports shingles and denies abdominal pain, bleeding/bruising, weight loss and any other symptoms.   MEDICAL HISTORY:    Past Medical History:  Diagnosis Date  . Arthritis   . Asthma    controlled with singulair  . Breast cancer ([place])    stage I left breast  . Current use of long term anticoagulation 04/21/2017  . Dyspnea, unspecified 02/24/2017   Started coincident with [place], exertional and orthostatic, normal exam. O2 sat 100%.  . Family history of breast cancer   . GERD (gastroesophageal reflux disease)   . H/O blood clots    clotting disorder since 2010  . Hematoma of arm 08/09/2012  . 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 Hyperlipidemia   . Hypertension   . 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 Pneumonia    viral pneumonia as a kid  . PONV (postoperative nausea and vomiting)     SURGICAL HISTORY: Past Surgical History:  Procedure Laterality Date  . BREAST SURGERY     Left breast lumpectomy sentinel node biopsy  . CERVICAL SPINE SURGERY  05/23/2008  . COLECTOMY  04/11/2008   left colectomy with end colostomy due to clot  . COLOSTOMY TAKEDOWN  08/14/2008  . EYE SURGERY     bil cataracts  . HERNIA REPAIR  2011   [facility]  . INTRAVASCULAR PRESSURE WIRE/FFR STUDY N/A 01/25/2018   Procedure: INTRAVASCULAR PRESSURE WIRE/FFR STUDY;  Surgeon: Nelva Bush, MD;  Location: [facility];  Service: Cardiovascular;  Laterality: N/A;  . LEFT HEART CATH AND CORONARY ANGIOGRAPHY N/A 01/25/2018   Procedure: LEFT HEART CATH AND CORONARY ANGIOGRAPHY;  Surgeon: Nelva Bush, MD;  Location: [facility];  Service: Cardiovascular;  Laterality: N/A;  . LUMBAR [place] SURGERY  2006  . TOTAL HIP ARTHROPLASTY Left 10/07/2016   Procedure: LEFT TOTAL HIP ARTHROPLASTY ANTERIOR APPROACH;  Surgeon: Paralee Cancel, MD;  Location: [facility];  Service: Orthopedics;  Laterality: Left;  70 mins    SOCIAL HISTORY: Social History   Socioeconomic History  . Marital status: Married    Spouse name: Not on file  . Number of  children: Not on file  . Years of education: Not on file  . Highest education level: Not on file  Occupational History  . Not on file  Tobacco Use  . Smoking status: Former Smoker    Packs/day: 1.00    Years: 5.00    Pack years: 5.00    Start date: 58    Quit date: 1964    Years since quitting: 57.3  . Smokeless tobacco: Never Used  Substance and Sexual Activity  . Alcohol use: Yes    Alcohol/week: 0.0 standard drinks    Comment: Rare  . Drug use: No  . Sexual activity: Not Currently    Partners: Male    Comment: 1st intercourse 79 yo-Fewer than 5 partners  Other Topics Concern  . Not on file  Social History Narrative  . Not on file   Social Determinants of Health   Financial Resource Strain:   .  Difficulty of Paying Living Expenses:   Food Insecurity:   . Worried About Charity fundraiser in the Last Year:   . Arboriculturist in the Last Year:   Transportation Needs:   . Film/video editor (Medical):   Marland Kitchen Lack of Transportation (Non-Medical):   Physical Activity:   . Days of Exercise per Week:   . Minutes of Exercise per Session:   Stress:   . Feeling of Stress :   Social Connections:   . Frequency of Communication with Friends and Family:   . Frequency of Social Gatherings with Friends and Family:   . Attends Religious Services:   . Active Member of Clubs or Organizations:   . Attends Archivist Meetings:   Marland Kitchen Marital Status:   Intimate Partner Violence:   . Fear of Current or Ex-Partner:   . Emotionally Abused:   Marland Kitchen Physically Abused:   . Sexually Abused:     FAMILY HISTORY: Family History  Problem Relation Age of Onset  . Breast cancer Mother 71  . Lung cancer Mother   . Heart disease Father   . Basal cell carcinoma Father        x2  . CAD Father   . Ovarian cysts Sister   . Dementia Sister   . Breast cancer Maternal Aunt 62  . Breast cancer Paternal Aunt 59  . Ovarian cysts Maternal Grandmother        possibly had ovarian cancer  as well  . Breast cancer Maternal Aunt 25  . Cancer Other        either pancreatic or colon  . Other Daughter 71       breast lumpectomy- complex sclerosing lesion  . Polycystic ovary syndrome Daughter   . Bladder Cancer Cousin     ALLERGIES:  is allergic to lipitor [atorvastatin calcium]; penicillins; sulfa antibiotics; and tamiflu [oseltamivir phosphate].  MEDICATIONS:  Current Outpatient Medications  Medication Sig Dispense Refill  . albuterol (VENTOLIN HFA) 108 (90 Base) MCG/ACT inhaler Inhale 2 puffs into the lungs every 6 (six) hours as needed for wheezing or shortness of breath. 18 g 3  . amLODipine (NORVASC) 5 MG tablet Take 5 mg by mouth daily.    Marland Kitchen aspirin 81 MG tablet Take 81 mg by mouth daily.      . Cholecalciferol (VITAMIN D) 2000 units CAPS Take 4,000 Units by mouth daily.     Marland Kitchen docusate sodium (COLACE) 100 MG capsule Take 1 capsule (100 mg total) by mouth 2 (two) times daily. (Patient taking differently: Take 100 mg by mouth daily. ) 10 capsule 0  . Fluticasone Furoate-Vilanterol (BREO ELLIPTA IN) Inhale into the lungs daily.    . fluticasone furoate-vilanterol (BREO ELLIPTA) 200-25 MCG/INH AEPB Inhale 1 puff into the lungs daily. 836 each 0  . folic acid (FOLVITE) 1 MG tablet Take 1 mg by mouth daily.    . hydroxyurea (HYDREA) 500 MG capsule TAKE ONE CAPSULE BY MOUTH DAILY WITH FOOD TO MINIMIZE GI SIDE EFFECTS 90 capsule 0  . hyoscyamine (LEVBID) 0.375 MG 12 hr tablet Take 1 tablet (0.375 mg total) by mouth 2 (two) times daily. (Patient taking differently: Take 0.125 mg by mouth every 12 (twelve) hours as needed. ) 60 tablet 11  . ondansetron (ZOFRAN) 4 MG tablet Take 1 tablet (4 mg total) by mouth every 6 (six) hours as needed for nausea. 30 tablet 0  . polyethylene glycol (MIRALAX / GLYCOLAX) packet Take 17 g by mouth  2 (two) times daily. (Patient taking differently: Take 17 g by mouth daily. ) 14 each 0  . rivaroxaban (XARELTO) 20 MG TABS tablet Take 1 tablet (20 mg  total) by mouth daily with supper. 90 tablet 1  . rosuvastatin (CRESTOR) 20 MG tablet Take 1 tablet (20 mg total) by mouth daily. 90 tablet 2  . Spacer/Aero-Holding Chambers DEVI 1 Device by Does not apply route as directed. 1 each 0  . triamterene-hydrochlorothiazide (DYAZIDE) 37.5-25 MG capsule Take 1 capsule by mouth daily.     No current facility-administered medications for this visit.    REVIEW OF SYSTEMS:   A 10+ POINT REVIEW OF SYSTEMS WAS OBTAINED including neurology, dermatology, psychiatry, cardiac, respiratory, lymph, extremities, GI, GU, Musculoskeletal, constitutional, breasts, reproductive, HEENT.  All pertinent positives are noted in the HPI.  All others are negative.   PHYSICAL EXAMINATION: ECOG FS:2 - Symptomatic, <50% confined to bed  There were no vitals filed for this visit. Wt Readings from Last 3 Encounters:  05/25/19 93 lb (42.2 kg)  05/18/19 94 lb 9.6 oz (42.9 kg)  04/19/19 85 lb (38.6 kg)   There is no height or weight on file to calculate BMI.    GENERAL:alert, in no acute distress and comfortable SKIN: no acute rashes, no significant lesions EYES: conjunctiva are pink and non-injected, sclera anicteric OROPHARYNX: MMM, no exudates, no oropharyngeal erythema or ulceration NECK: supple, no JVD LYMPH:  no palpable lymphadenopathy in the cervical, axillary or inguinal regions LUNGS: clear to auscultation b/l with normal respiratory effort HEART: regular rate & rhythm ABDOMEN:  normoactive bowel sounds , non tender, not distended. Extremity: no pedal edema PSYCH: alert & oriented x 3 with fluent speech NEURO: no focal motor/sensory deficits  LABORATORY DATA:  I have reviewed the data as listed  . CBC Latest Ref Rng & Units 04/18/2019 12/23/2018 09/22/2018  WBC 4.0 - 10.5 K/uL 7.4 6.2 6.5  Hemoglobin 12.0 - 15.0 g/dL 13.1 13.0 12.5  Hematocrit 36.0 - 46.0 % 39.7 41.0 39.7  Platelets 150 - 400 K/uL 379 292 377    . CMP Latest Ref Rng & Units 04/18/2019  12/23/2018 09/22/2018  Glucose 70 - 99 mg/dL 85 85 65(L)  BUN 8 - 23 mg/dL '19 18 22  '$ Creatinine 0.44 - 1.00 mg/dL 0.93 0.98 0.99  Sodium 135 - 145 mmol/L 140 139 143  Potassium 3.5 - 5.1 mmol/L 3.4(L) 3.6 3.8  Chloride 98 - 111 mmol/L 103 103 103  CO2 22 - 32 mmol/L '28 29 29  '$ Calcium 8.9 - 10.3 mg/dL 9.5 9.9 9.7  Total Protein 6.5 - 8.1 g/dL 6.6 6.7 6.9  Total Bilirubin 0.3 - 1.2 mg/dL 0.6 0.5 0.6  Alkaline Phos 38 - 126 U/L 49 48 51  AST 15 - 41 U/L '19 18 19  '$ ALT 0 - 44 U/L '18 14 15   '$ 06/07/08 JAK2:    RADIOGRAPHIC STUDIES: I have personally reviewed the radiological images as listed and agreed with the findings in the report. No results found.  ASSESSMENT & PLAN:  79 y.o. female with  1. 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  2. 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 S/p Lumpectomy, Radiation, 4 cycles of Cytoxan and Taxotere. Completed maintenance Arimidex until Fall 2019 Continues with annual mammograms with OBGYN Dr. Donalynn Furlong.  PLAN: -Discussed  pt labwork today, 07/22/19; of CBC w/diff and CMP is as follows: all values are WNL except for RDW at 18.6, Potassium at 3.3, GFR, Est Non Af Am at 57  -Discussed 07/22/19 of LDH at 371 -The pt has no prohibitive toxicities from continuing '500mg'$  Hydroxyurea daily, at this time. -Will continue with labs every 3 months, will space it out to every 6 months if labs are done in between by PCP or other provider.  -Will continue at 20 mg Xarelto daily  -Reviewed watching weight loss to determine if Hydroxyurea and Xarelto dosage is appropriate. -Recommend pt eat as much as possible, continue using high-calorie, high-protein Ensure -Recommended pt to de-stress as much as possible -Recommends getting Shingrix vaccine when  available to the pt -Recommends pursuing skin protection, long flowing clothing -Continue to f/u with PCP about shingles  -Will see back in 3 month   FOLLOW UP: RTC with Dr Irene Limbo with labs in 3 months  The total time spent in the appt was 20 minutes and more than 50% was on counseling and direct patient cares.  All of the patient's questions were answered with apparent satisfaction. The patient knows to call the clinic with any problems, questions or concerns.  Sullivan Lone MD [place] AAHIVMS [facility] Hematology/Oncology Physician [facility]  (Office):       [phone] (Work cell):  [phone] (Fax):           [phone]  07/21/2019 3:03 PM  I, Dawayne Cirri am acting as a Education administrator for Dr. Sullivan Lone.   .I have reviewed the above documentation for accuracy and completeness, and I agree with the above. Brunetta Genera MD

## 2019-07-22 ENCOUNTER — Inpatient Hospital Stay: Payer: Medicare Other | Attending: Hematology | Admitting: Hematology

## 2019-07-22 ENCOUNTER — Other Ambulatory Visit: Payer: Self-pay

## 2019-07-22 ENCOUNTER — Telehealth: Payer: Self-pay | Admitting: Hematology

## 2019-07-22 ENCOUNTER — Inpatient Hospital Stay: Payer: Medicare Other

## 2019-07-22 VITALS — BP 126/87 | HR 72 | Temp 98.3°F | Resp 17 | Ht 59.0 in | Wt 94.2 lb

## 2019-07-22 DIAGNOSIS — D473 Essential (hemorrhagic) thrombocythemia: Secondary | ICD-10-CM

## 2019-07-22 DIAGNOSIS — Z79899 Other long term (current) drug therapy: Secondary | ICD-10-CM | POA: Insufficient documentation

## 2019-07-22 DIAGNOSIS — Z853 Personal history of malignant neoplasm of breast: Secondary | ICD-10-CM | POA: Diagnosis not present

## 2019-07-22 LAB — CBC WITH DIFFERENTIAL/PLATELET
Abs Immature Granulocytes: 0.04 10*3/uL (ref 0.00–0.07)
Basophils Absolute: 0.1 10*3/uL (ref 0.0–0.1)
Basophils Relative: 1 %
Eosinophils Absolute: 0.1 10*3/uL (ref 0.0–0.5)
Eosinophils Relative: 1 %
HCT: 43.1 % (ref 36.0–46.0)
Hemoglobin: 13.4 g/dL (ref 12.0–15.0)
Immature Granulocytes: 1 %
Lymphocytes Relative: 11 %
Lymphs Abs: 0.9 10*3/uL (ref 0.7–4.0)
MCH: 29.5 pg (ref 26.0–34.0)
MCHC: 31.1 g/dL (ref 30.0–36.0)
MCV: 94.9 fL (ref 80.0–100.0)
Monocytes Absolute: 0.3 10*3/uL (ref 0.1–1.0)
Monocytes Relative: 4 %
Neutro Abs: 6.7 10*3/uL (ref 1.7–7.7)
Neutrophils Relative %: 82 %
Platelets: 314 10*3/uL (ref 150–400)
RBC: 4.54 MIL/uL (ref 3.87–5.11)
RDW: 18.6 % — ABNORMAL HIGH (ref 11.5–15.5)
WBC: 8 10*3/uL (ref 4.0–10.5)
nRBC: 0 % (ref 0.0–0.2)

## 2019-07-22 LAB — CMP (CANCER CENTER ONLY)
ALT: 25 U/L (ref 0–44)
AST: 23 U/L (ref 15–41)
Albumin: 4 g/dL (ref 3.5–5.0)
Alkaline Phosphatase: 59 U/L (ref 38–126)
Anion gap: 14 (ref 5–15)
BUN: 22 mg/dL (ref 8–23)
CO2: 30 mmol/L (ref 22–32)
Calcium: 9.8 mg/dL (ref 8.9–10.3)
Chloride: 100 mmol/L (ref 98–111)
Creatinine: 0.97 mg/dL (ref 0.44–1.00)
GFR, Est AFR Am: >60 mL/min (ref >60)
GFR, Estimated: 57 mL/min — ABNORMAL LOW (ref >60)
Glucose, Bld: 75 mg/dL (ref 70–99)
Potassium: 3.3 mmol/L — ABNORMAL LOW (ref 3.5–5.1)
Sodium: 144 mmol/L (ref 135–145)
Total Bilirubin: 0.6 mg/dL (ref 0.3–1.2)
Total Protein: 7.1 g/dL (ref 6.5–8.1)

## 2019-07-22 LAB — LACTATE DEHYDROGENASE: LDH: 371 U/L — ABNORMAL HIGH (ref 98–192)

## 2019-07-22 NOTE — Telephone Encounter (Signed)
Scheduled per 04/23 los, patient received printed calender.

## 2019-08-10 DIAGNOSIS — N183 Chronic kidney disease, stage 3 unspecified: Secondary | ICD-10-CM | POA: Diagnosis not present

## 2019-08-10 DIAGNOSIS — J479 Bronchiectasis, uncomplicated: Secondary | ICD-10-CM | POA: Diagnosis not present

## 2019-08-10 DIAGNOSIS — Z853 Personal history of malignant neoplasm of breast: Secondary | ICD-10-CM | POA: Diagnosis not present

## 2019-08-10 DIAGNOSIS — M169 Osteoarthritis of hip, unspecified: Secondary | ICD-10-CM | POA: Diagnosis not present

## 2019-08-10 DIAGNOSIS — I129 Hypertensive chronic kidney disease with stage 1 through stage 4 chronic kidney disease, or unspecified chronic kidney disease: Secondary | ICD-10-CM | POA: Diagnosis not present

## 2019-08-10 DIAGNOSIS — M810 Age-related osteoporosis without current pathological fracture: Secondary | ICD-10-CM | POA: Diagnosis not present

## 2019-08-10 DIAGNOSIS — E7800 Pure hypercholesterolemia, unspecified: Secondary | ICD-10-CM | POA: Diagnosis not present

## 2019-08-10 DIAGNOSIS — I2510 Atherosclerotic heart disease of native coronary artery without angina pectoris: Secondary | ICD-10-CM | POA: Diagnosis not present

## 2019-08-11 ENCOUNTER — Telehealth: Payer: Self-pay | Admitting: Gastroenterology

## 2019-08-11 NOTE — Telephone Encounter (Signed)
Patient called states she has a few concerns spoke with her PCP and they recommended she call us

## 2019-08-11 NOTE — Telephone Encounter (Signed)
The pt states she was diagnosed with shingles about 8-10 weeks ago that has resolved but she did develop an anal hematoma that the PCP thinks GI needs to evaluate.  She was told that with shingles this can happen but should have resolved by now.  Appt made with Ellouise Newer on 5/21. The pt has been advised of the information and verbalized understanding.

## 2019-08-19 ENCOUNTER — Encounter: Payer: Self-pay | Admitting: Physician Assistant

## 2019-08-19 ENCOUNTER — Ambulatory Visit (INDEPENDENT_AMBULATORY_CARE_PROVIDER_SITE_OTHER): Payer: Medicare Other | Admitting: Physician Assistant

## 2019-08-19 VITALS — BP 124/72 | HR 68 | Ht 59.0 in | Wt 93.1 lb

## 2019-08-19 DIAGNOSIS — K6289 Other specified diseases of anus and rectum: Secondary | ICD-10-CM | POA: Diagnosis not present

## 2019-08-19 DIAGNOSIS — K644 Residual hemorrhoidal skin tags: Secondary | ICD-10-CM | POA: Diagnosis not present

## 2019-08-19 NOTE — Progress Notes (Signed)
I agree with the above note, plan 

## 2019-08-19 NOTE — Patient Instructions (Signed)
You may use sitz baths 2-3 times a day as needed (15-20  Minutes)  Recticare as needed

## 2019-08-19 NOTE — Progress Notes (Signed)
Chief Complaint: Rectal hematoma?  HPI:    [name] is an 79 year old female with a past medical history as listed below, known to Dr. Ardis Hughs, who was referred to me by Gaynelle Arabian, MD for a complaint of rectal hematoma.      08/11/2019 patient called and described she had been diagnosed with shingles 8 to 10 weeks ago and that had resolved but she had developed an anal hematoma that her PCP thought will need to evaluate.    Today, the patient presents to clinic accompanied by her husband and explains that she was diagnosed with shingles about 7 to 8 weeks ago around mid March and the shingles went from her right buttock and down into her bottom.  Explains that she went on Valacyclovir for 10 days, but her symptoms lasted for many weeks and now she is left with skin discoloration and still has some sensitivity around this area that feels like the same nerve pain.  Also tells me that she has noticed some rectal hematomas on the left and right side of her bottom, they seem to get larger at times.  She was told these would go away on their own and are common with shingles, but it is been 7 weeks and they are still there.  They are somewhat uncomfortable and the patient tells me she has plans to drive to [place] in the next couple of weeks and is worried that this will aggravate it.    Denies fever, chills, change in bowel habits or abdominal pain.  Past Medical History:  Diagnosis Date  . Arthritis   . Asthma    controlled with singulair  . Breast cancer ([place])    stage I left breast  . Current use of long term anticoagulation 04/21/2017  . Dyspnea, unspecified 02/24/2017   Started coincident with [place], exertional and orthostatic, normal exam. O2 sat 100%.  . Family history of breast cancer   . GERD (gastroesophageal reflux disease)   . H/O blood clots    clotting disorder since 2010  . Hematoma of arm 08/09/2012  . 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 Hyperlipidemia   . Hypertension   . 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 Pneumonia    viral pneumonia as a kid  . PONV (postoperative nausea and vomiting)     Past Surgical History:  Procedure Laterality Date  . BREAST SURGERY     Left breast lumpectomy sentinel node biopsy  . CERVICAL SPINE SURGERY  05/23/2008  . COLECTOMY  04/11/2008   left colectomy with end colostomy due to clot  . COLOSTOMY TAKEDOWN  08/14/2008  . EYE SURGERY     bil cataracts  . HERNIA REPAIR  2011   [facility]  . INTRAVASCULAR PRESSURE WIRE/FFR STUDY N/A 01/25/2018   Procedure: INTRAVASCULAR PRESSURE WIRE/FFR STUDY;  Surgeon: Nelva Bush, MD;  Location: [facility];  Service: Cardiovascular;  Laterality: N/A;  . LEFT HEART CATH AND CORONARY ANGIOGRAPHY N/A 01/25/2018   Procedure: LEFT HEART CATH AND CORONARY ANGIOGRAPHY;  Surgeon: Nelva Bush, MD;  Location: [facility];  Service: Cardiovascular;  Laterality: N/A;  . LUMBAR [place] SURGERY  2006  . TOTAL HIP ARTHROPLASTY Left 10/07/2016   Procedure: LEFT TOTAL HIP ARTHROPLASTY ANTERIOR APPROACH;  Surgeon: Paralee Cancel, MD;  Location: [facility];  Service: Orthopedics;  Laterality: Left;  70 mins    Current  Outpatient Medications  Medication Sig Dispense Refill  . albuterol (VENTOLIN HFA) 108 (90 Base) MCG/ACT inhaler Inhale 2 puffs into the lungs every 6 (six) hours as needed for wheezing or shortness of breath. 18 g 3  . amLODipine (NORVASC) 5 MG tablet Take 5 mg by mouth daily.    Marland Kitchen aspirin 81 MG tablet Take 81 mg by mouth daily.      . Cholecalciferol (VITAMIN D) 2000 units CAPS Take 4,000 Units by mouth daily.     Marland Kitchen docusate sodium (COLACE) 100 MG capsule Take 1 capsule (100 mg total) by mouth 2 (two) times daily. (Patient taking differently: Take 100 mg by mouth daily. ) 10 capsule 0  . fluticasone furoate-vilanterol (BREO ELLIPTA) 200-25 MCG/INH AEPB Inhale 1 puff into the  lungs daily. [id number] each 0  . folic acid (FOLVITE) 1 MG tablet Take 1 mg by mouth daily.    . hydroxyurea (HYDREA) 500 MG capsule TAKE ONE CAPSULE BY MOUTH DAILY WITH FOOD TO MINIMIZE GI SIDE EFFECTS 90 capsule 0  . hyoscyamine (LEVSIN SL) 0.125 MG SL tablet Place 0.125 mg under the tongue as needed.    . ondansetron (ZOFRAN) 4 MG tablet Take 1 tablet (4 mg total) by mouth every 6 (six) hours as needed for nausea. 30 tablet 0  . polyethylene glycol (MIRALAX / GLYCOLAX) packet Take 17 g by mouth 2 (two) times daily. (Patient taking differently: Take 17 g by mouth daily. ) 14 each 0  . rivaroxaban (XARELTO) 20 MG TABS tablet Take 1 tablet (20 mg total) by mouth daily with supper. 90 tablet 1  . rosuvastatin (CRESTOR) 20 MG tablet Take 1 tablet (20 mg total) by mouth daily. 90 tablet 2  . Spacer/Aero-Holding Chambers DEVI 1 Device by Does not apply route as directed. 1 each 0  . triamterene-hydrochlorothiazide (DYAZIDE) 37.5-25 MG capsule Take 1 capsule by mouth daily.     No current facility-administered medications for this visit.    Allergies as of 08/19/2019 - Review Complete 08/19/2019  Allergen Reaction Noted  . Lipitor [atorvastatin calcium]  05/28/2018  . Penicillins Hives 08/30/2010  . Sulfa antibiotics Other (See Comments) 08/04/2011  . Tamiflu [oseltamivir phosphate] Nausea And Vomiting 04/23/2018    Family History  Problem Relation Age of Onset  . Breast cancer Mother 66  . Lung cancer Mother   . Heart disease Father   . Basal cell carcinoma Father        x2  . CAD Father   . Ovarian cysts Sister   . Dementia Sister   . Breast cancer Maternal Aunt 65  . Breast cancer Paternal Aunt 92  . Ovarian cysts Maternal Grandmother        possibly had ovarian cancer as well  . Breast cancer Maternal Aunt 62  . Cancer Other        either pancreatic or colon  . Other Daughter 30       breast lumpectomy- complex sclerosing lesion  . Polycystic ovary syndrome Daughter   . Bladder  Cancer Cousin     Social History   Socioeconomic History  . Marital status: Married    Spouse name: Not on file  . Number of children: Not on file  . Years of education: Not on file  . Highest education level: Not on file  Occupational History  . Not on file  Tobacco Use  . Smoking status: Former Smoker    Packs/day: 1.00    Years: 5.00    Pack  years: 5.00    Start date: 61    Quit date: 1964    Years since quitting: 28.4  . Smokeless tobacco: Never Used  Substance and Sexual Activity  . Alcohol use: Yes    Alcohol/week: 0.0 standard drinks    Comment: Rare  . Drug use: No  . Sexual activity: Not Currently    Partners: Male    Comment: 1st intercourse 79 yo-Fewer than 5 partners  Other Topics Concern  . Not on file  Social History Narrative  . Not on file   Social Determinants of Health   Financial Resource Strain:   . Difficulty of Paying Living Expenses:   Food Insecurity:   . Worried About Charity fundraiser in the Last Year:   . Arboriculturist in the Last Year:   Transportation Needs:   . Film/video editor (Medical):   Marland Kitchen Lack of Transportation (Non-Medical):   Physical Activity:   . Days of Exercise per Week:   . Minutes of Exercise per Session:   Stress:   . Feeling of Stress :   Social Connections:   . Frequency of Communication with Friends and Family:   . Frequency of Social Gatherings with Friends and Family:   . Attends Religious Services:   . Active Member of Clubs or Organizations:   . Attends Archivist Meetings:   Marland Kitchen Marital Status:   Intimate Partner Violence:   . Fear of Current or Ex-Partner:   . Emotionally Abused:   Marland Kitchen Physically Abused:   . Sexually Abused:     Review of Systems:    Constitutional: No weight loss, fever or chills Cardiovascular: No chest pain Respiratory: No SOB  Gastrointestinal: See HPI and otherwise negative   Physical Exam:  Vital signs: BP 124/72   Pulse 68   Ht 4\' 11"  (1.499 m)   Wt  93 lb 2 oz (42.2 kg)   BMI 18.81 kg/m   Constitutional:   Pleasant Caucasian female appears to be in NAD, Well developed, Well nourished, alert and cooperative Respiratory: Respirations even and unlabored. Lungs clear to auscultation bilaterally.   No wheezes, crackles, or rhonchi.  Cardiovascular: Normal S1, S2. No MRG. Regular rate and rhythm. No peripheral edema, cyanosis or pallor.  Gastrointestinal:  Soft, nondistended, nontender. No rebound or guarding. Normal bowel sounds. No appreciable masses or hepatomegaly. Rectal:  External: discoloration from shingles rash top of right buttock, external hemorrhoid tissue on the left, some tenderness, 2-3 pinpoint purple colored nodules on the right superior to anal opening which are tender  Psychiatric: Demonstrates good judgement and reason without abnormal affect or behaviors.  MOST RECENT LABS AND IMAGING: CBC    Component Value Date/Time   WBC 8.0 07/22/2019 1114   RBC 4.54 07/22/2019 1114   HGB 13.4 07/22/2019 1114   HGB 12.3 06/22/2018 0955   HGB 11.8 02/02/2018 1019   HGB 11.8 12/08/2014 1540   HCT 43.1 07/22/2019 1114   HCT 35.3 02/02/2018 1019   HCT 36.8 12/08/2014 1540   PLT 314 07/22/2019 1114   PLT 384 06/22/2018 0955   PLT 352 02/02/2018 1019   MCV 94.9 07/22/2019 1114   MCV 100 (H) 02/02/2018 1019   MCV 72.0 (L) 12/08/2014 1540   MCH 29.5 07/22/2019 1114   MCHC 31.1 07/22/2019 1114   RDW 18.6 (H) 07/22/2019 1114   RDW 13.3 02/02/2018 1019   RDW 17.1 (H) 12/08/2014 1540   LYMPHSABS 0.9 07/22/2019 1114   LYMPHSABS  1.1 02/02/2018 1019   LYMPHSABS 1.4 12/08/2014 1540   MONOABS 0.3 07/22/2019 1114   MONOABS 0.6 12/08/2014 1540   EOSABS 0.1 07/22/2019 1114   EOSABS 0.1 02/02/2018 1019   BASOSABS 0.1 07/22/2019 1114   BASOSABS 0.1 02/02/2018 1019   BASOSABS 0.1 12/08/2014 1540    CMP     Component Value Date/Time   NA 144 07/22/2019 1114   NA 141 09/06/2018 0950   NA 137 12/08/2014 1540   K 3.3 (L) 07/22/2019  1114   K 4.2 12/08/2014 1540   CL 100 07/22/2019 1114   CL 105 08/03/2012 1529   CO2 30 07/22/2019 1114   CO2 25 12/08/2014 1540   GLUCOSE 75 07/22/2019 1114   GLUCOSE 84 12/08/2014 1540   GLUCOSE 84 08/03/2012 1529   BUN 22 07/22/2019 1114   BUN 27 09/06/2018 0950   BUN 27.4 (H) 12/08/2014 1540   CREATININE 0.97 07/22/2019 1114   CREATININE 1.1 12/08/2014 1540   CALCIUM 9.8 07/22/2019 1114   CALCIUM 9.9 12/08/2014 1540   PROT 7.1 07/22/2019 1114   PROT 5.8 (L) 09/06/2018 0950   PROT 6.3 (L) 12/08/2014 1540   ALBUMIN 4.0 07/22/2019 1114   ALBUMIN 4.1 09/06/2018 0950   ALBUMIN 3.8 12/08/2014 1540   AST 23 07/22/2019 1114   AST 23 12/08/2014 1540   ALT 25 07/22/2019 1114   ALT 21 12/08/2014 1540   ALKPHOS 59 07/22/2019 1114   ALKPHOS 42 12/08/2014 1540   BILITOT 0.6 07/22/2019 1114   BILITOT 0.65 12/08/2014 1540   GFRNONAA 57 (L) 07/22/2019 1114   GFRAA >60 07/22/2019 1114    Assessment: 1.  Rectal pain: Nodule seen on the right, question sequela of recent shingles 2.  External hemorrhoid: Nonthrombosed, very small  Plan: 1.  Patient was examined with Dr. Loletha Carrow at the time of her appointment.  He explained that hopefully this will go away with time. 2.  Provided the patient with samples of RectiCare with lidocaine to see if this helps some. 3.  Also recommended sitz bath's for 15 to 20 minutes at a time 2-3 times a day which may help shorten the length of symptoms. 4.  Patient to follow in clinic if anything worsens.  Otherwise reassurance was provided.  Ellouise Newer, PA-C [facility] 08/19/2019, 1:53 PM  Cc: Gaynelle Arabian, MD

## 2019-09-01 DIAGNOSIS — L578 Other skin changes due to chronic exposure to nonionizing radiation: Secondary | ICD-10-CM | POA: Diagnosis not present

## 2019-09-01 DIAGNOSIS — Z85828 Personal history of other malignant neoplasm of skin: Secondary | ICD-10-CM | POA: Diagnosis not present

## 2019-09-01 DIAGNOSIS — D225 Melanocytic nevi of trunk: Secondary | ICD-10-CM | POA: Diagnosis not present

## 2019-09-01 DIAGNOSIS — D2261 Melanocytic nevi of right upper limb, including shoulder: Secondary | ICD-10-CM | POA: Diagnosis not present

## 2019-09-01 DIAGNOSIS — L570 Actinic keratosis: Secondary | ICD-10-CM | POA: Diagnosis not present

## 2019-09-01 DIAGNOSIS — L821 Other seborrheic keratosis: Secondary | ICD-10-CM | POA: Diagnosis not present

## 2019-09-04 DIAGNOSIS — Z03818 Encounter for observation for suspected exposure to other biological agents ruled out: Secondary | ICD-10-CM | POA: Diagnosis not present

## 2019-09-04 DIAGNOSIS — Z20822 Contact with and (suspected) exposure to covid-19: Secondary | ICD-10-CM | POA: Diagnosis not present

## 2019-10-17 ENCOUNTER — Other Ambulatory Visit: Payer: Self-pay | Admitting: Hematology

## 2019-10-17 ENCOUNTER — Other Ambulatory Visit: Payer: Self-pay | Admitting: Pulmonary Disease

## 2019-10-17 DIAGNOSIS — D473 Essential (hemorrhagic) thrombocythemia: Secondary | ICD-10-CM

## 2019-10-17 NOTE — Telephone Encounter (Signed)
Please review for refill - patient has appointments on 10/21/19 for lab and MD

## 2019-10-19 NOTE — Progress Notes (Signed)
HEMATOLOGY/ONCOLOGY CLINIC NOTE  Date of Service: 10/21/2019  Patient Care Team: Gaynelle Arabian, MD as PCP - General (Family Medicine) Skeet Latch, MD as PCP - Cardiology (Cardiology) Annia Belt, MD as Consulting Physician (Oncology) Rolm Bookbinder, MD as Consulting Physician (General Surgery)  CHIEF COMPLAINTS/PURPOSE OF CONSULTATION:  Essential Thrombocytosis  HISTORY OF PRESENTING ILLNESS:   Gwendolyn Williams is a wonderful 79 y.o. female who has been referred to [place] by Dr. Gaynelle Arabian, and was previously under the care of Dr. 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 The pt reports that she is doing well overall.   The pt reports that she has seen Dr. Michail Sermon in GI and is concerned for esophageal spasm but does not wish to proceed with the diagnostic work up with esophageal manometry. She notes that muscle relaxants help with this occasional pain. She also follows up with Dr. Donne Hazel in surgery to determine if this related to her previous abdominal surgeries. 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  The pt notes that she does not have any other present concerns. She notes that she had SOB at the end of last year, as part of bronchiectasis, and takes Memory Dance once a day, denies needing her rescue inhaler. She denies positional elements to her SOB.  The pt notes that she was off Hyxdroxyurea during the Fall of 2019 during this work up. She then returned to '500mg'$  Hydoxyurea once a day and her PLT have normalized. She takes this in the morning with her food and denies any problems tolerating this medication.   The pt denies any concerns for bleeding at this time.  The pt continues with annual mammograms with her OBGYN Dr. Donalynn Furlong. The pt pursued maintenance Arimidex for 8.5 years, and stopped during the  evaluation of her respiratory symptoms in Fall 2019. The pt completes a bone density study every year, and notes she has stable osteopenia. She takes Vitamin D replacement and notes her levels are good.  She continues on '20mg'$  Xarelto. She continues on '81mg'$  aspirin as well.  The pt notes that she has had chronic mouth ulcers for "decades." She has tried Vitamin B complex and mouthwashes. She notes that she routinely gets mouth ulcers after her dental cleanings as well.  Most recent lab results (06/22/18) of CBC w/diff and CMP is as follows: all values are WNL except for RDW at 25.2, Potassium at 3.3, BUN at 30, Creatinine at 1.23, GFR at 43.  On review of systems, pt reports intermittent esophageal discomfort, stable breathing, good energy levels, some abdominal tenderness, chronic mouth ulcers, and denies concerns for bleeding, and any other symptoms.  Interval History:   Gwendolyn Williams returns today for management and evaluation of her Essential Thrombocytosis. The patient's last visit with [place] was on 07/22/19. The pt reports that she is doing well overall.  The pt reports she is good. Her shingles have gone away. Pt still has sensitive sports where the shingles were. She has gained 4 pounds and is eating well. Pt had a cough and she got antibiotics and went away. She is not having any troubles taking Hydroxyurea. Her diastolic blood pressure seems to be on the higher side than her normal.   Lab results today (10/21/19) of CBC w/diff and CMP is as follows: all values are WNL except for RDW at 15.6, Potassium  at 3.3 10/21/19 of LDH at 286  On review of systems, pt reports healthy appetite and denies bleeding issues, infection issues, abdominal pain and any other symptoms.   MEDICAL HISTORY:  Past Medical History:  Diagnosis Date  . Arthritis   . Asthma    controlled with singulair  . Breast cancer ([place])    stage I left breast  . Current use of long term anticoagulation 04/21/2017  .  Dyspnea, unspecified 02/24/2017   Started coincident with [place]rea, exertional and orthostatic, normal exam. O2 sat 100%.  . Family history of breast cancer   . GERD (gastroesophageal reflux disease)   . H/O blood clots    clotting disorder since 2010  . Hematoma of arm 08/09/2012  . 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 Hyperlipidemia   . Hypertension   . 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 Pneumonia    viral pneumonia as a kid  . PONV (postoperative nausea and vomiting)     SURGICAL HISTORY: Past Surgical History:  Procedure Laterality Date  . BREAST SURGERY     Left breast lumpectomy sentinel node biopsy  . CERVICAL SPINE SURGERY  05/23/2008  . COLECTOMY  04/11/2008   left colectomy with end colostomy due to clot  . COLOSTOMY TAKEDOWN  08/14/2008  . EYE SURGERY     bil cataracts  . HERNIA REPAIR  2011   [facility]  . INTRAVASCULAR PRESSURE WIRE/FFR STUDY N/A 01/25/2018   Procedure: INTRAVASCULAR PRESSURE WIRE/FFR STUDY;  Surgeon: Nelva Bush, MD;  Location: [facility];  Service: Cardiovascular;  Laterality: N/A;  . LEFT HEART CATH AND CORONARY ANGIOGRAPHY N/A 01/25/2018   Procedure: LEFT HEART CATH AND CORONARY ANGIOGRAPHY;  Surgeon: Nelva Bush, MD;  Location: [facility];  Service: Cardiovascular;  Laterality: N/A;  . LUMBAR [place] SURGERY  2006  . TOTAL HIP ARTHROPLASTY Left 10/07/2016   Procedure: LEFT TOTAL HIP ARTHROPLASTY ANTERIOR APPROACH;  Surgeon: Paralee Cancel, MD;  Location: [facility];  Service: Orthopedics;  Laterality: Left;  70 mins    SOCIAL HISTORY: Social History   Socioeconomic History  . Marital status: Married    Spouse name: Not on file  . Number of children: Not on file  . Years of education: Not on file  . Highest education level: Not on file  Occupational History  . Not on file  Tobacco Use  . Smoking status: Former Smoker    Packs/day: 1.00     Years: 5.00    Pack years: 5.00    Start date: 43    Quit date: 1964    Years since quitting: 57.5  . Smokeless tobacco: Never Used  Vaping Use  . Vaping Use: Never used  Substance and Sexual Activity  . Alcohol use: Yes    Alcohol/week: 0.0 standard drinks    Comment: Rare  . Drug use: No  . Sexual activity: Not Currently    Partners: Male    Comment: 1st intercourse 79 yo-Fewer than 5 partners  Other Topics Concern  . Not on file  Social History Narrative  . Not on file   Social Determinants of Health   Financial Resource Strain:   . Difficulty of Paying Living Expenses:   Food Insecurity:   . Worried About Charity fundraiser in the Last Year:   . Arboriculturist in the Last Year:   Transportation Needs:   .  Lack of Transportation (Medical):   Marland Kitchen Lack of Transportation (Non-Medical):   Physical Activity:   . Days of Exercise per Week:   . Minutes of Exercise per Session:   Stress:   . Feeling of Stress :   Social Connections:   . Frequency of Communication with Friends and Family:   . Frequency of Social Gatherings with Friends and Family:   . Attends Religious Services:   . Active Member of Clubs or Organizations:   . Attends Archivist Meetings:   Marland Kitchen Marital Status:   Intimate Partner Violence:   . Fear of Current or Ex-Partner:   . Emotionally Abused:   Marland Kitchen Physically Abused:   . Sexually Abused:     FAMILY HISTORY: Family History  Problem Relation Age of Onset  . Breast cancer Mother 92  . Lung cancer Mother   . Heart disease Father   . Basal cell carcinoma Father        x2  . CAD Father   . Ovarian cysts Sister   . Dementia Sister   . Breast cancer Maternal Aunt 17  . Breast cancer Paternal Aunt 47  . Ovarian cysts Maternal Grandmother        possibly had ovarian cancer as well  . Breast cancer Maternal Aunt 38  . Cancer Other        either pancreatic or colon  . Other Daughter 73       breast lumpectomy- complex sclerosing  lesion  . Polycystic ovary syndrome Daughter   . Bladder Cancer Cousin     ALLERGIES:  is allergic to lipitor [atorvastatin calcium], penicillins, sulfa antibiotics, and tamiflu [oseltamivir phosphate].  MEDICATIONS:  Current Outpatient Medications  Medication Sig Dispense Refill  . albuterol (VENTOLIN HFA) 108 (90 Base) MCG/ACT inhaler Inhale 2 puffs into the lungs every 6 (six) hours as needed for wheezing or shortness of breath. 18 g 3  . amLODipine (NORVASC) 5 MG tablet Take 5 mg by mouth daily.    Marland Kitchen aspirin 81 MG tablet Take 81 mg by mouth daily.      Marland Kitchen BREO ELLIPTA 200-25 MCG/INH AEPB USE 1 INHALATION ORALLY    DAILY 60 each 5  . Cholecalciferol (VITAMIN D) 2000 units CAPS Take 4,000 Units by mouth daily.     Marland Kitchen docusate sodium (COLACE) 100 MG capsule Take 1 capsule (100 mg total) by mouth 2 (two) times daily. (Patient taking differently: Take 100 mg by mouth daily. ) 10 capsule 0  . folic acid (FOLVITE) 1 MG tablet Take 1 mg by mouth daily.    . hydroxyurea (HYDREA) 500 MG capsule TAKE ONE CAPSULE BY MOUTH DAILY WITH FOOD TO MINIMIZE GI SIDE EFFECTS 90 capsule 0  . hyoscyamine (LEVSIN SL) 0.125 MG SL tablet Place 0.125 mg under the tongue as needed.    . ondansetron (ZOFRAN) 4 MG tablet Take 1 tablet (4 mg total) by mouth every 6 (six) hours as needed for nausea. 30 tablet 0  . polyethylene glycol (MIRALAX / GLYCOLAX) packet Take 17 g by mouth 2 (two) times daily. (Patient taking differently: Take 17 g by mouth daily. ) 14 each 0  . rosuvastatin (CRESTOR) 20 MG tablet Take 1 tablet (20 mg total) by mouth daily. 90 tablet 2  . Spacer/Aero-Holding Chambers DEVI 1 Device by Does not apply route as directed. 1 each 0  . triamterene-hydrochlorothiazide (DYAZIDE) 37.5-25 MG capsule Take 1 capsule by mouth daily.    Alveda Reasons 20 MG TABS tablet  TAKE 1 TABLET DAILY WITH   SUPPER 90 tablet 1   No current facility-administered medications for this visit.    REVIEW OF SYSTEMS:   A 10+ POINT  REVIEW OF SYSTEMS WAS OBTAINED including neurology, dermatology, psychiatry, cardiac, respiratory, lymph, extremities, GI, GU, Musculoskeletal, constitutional, breasts, reproductive, HEENT.  All pertinent positives are noted in the HPI.  All others are negative.   PHYSICAL EXAMINATION: ECOG FS:2 - Symptomatic, <50% confined to bed  Vitals:   10/21/19 1142  BP: (!) 134/92  Pulse: 79  Resp: 18  Temp: 97.8 F (36.6 C)  SpO2: 100%   Wt Readings from Last 3 Encounters:  10/21/19 (!) 97 lb (44 kg)  08/19/19 93 lb 2 oz (42.2 kg)  07/22/19 94 lb 3.2 oz (42.7 kg)   Body mass index is 19.59 kg/m.    GENERAL:alert, in no acute distress and comfortable SKIN: no acute rashes, no significant lesions EYES: conjunctiva are pink and non-injected, sclera anicteric OROPHARYNX: MMM, no exudates, no oropharyngeal erythema or ulceration NECK: supple, no JVD LYMPH:  no palpable lymphadenopathy in the cervical, axillary or inguinal regions LUNGS: clear to auscultation b/l with normal respiratory effort HEART: regular rate & rhythm ABDOMEN:  normoactive bowel sounds , non tender, not distended. Extremity: no pedal edema PSYCH: alert & oriented x 3 with fluent speech NEURO: no focal motor/sensory deficits  LABORATORY DATA:  I have reviewed the data as listed  . CBC Latest Ref Rng & Units 10/21/2019 07/22/2019 04/18/2019  WBC 4.0 - 10.5 K/uL 6.9 8.0 7.4  Hemoglobin 12.0 - 15.0 g/dL 12.1 13.4 13.1  Hematocrit 36 - 46 % 39.0 43.1 39.7  Platelets 150 - 400 K/uL 375 314 379    . CMP Latest Ref Rng & Units 10/21/2019 07/22/2019 04/18/2019  Glucose 70 - 99 mg/dL 85 75 85  BUN 8 - 23 mg/dL '20 22 19  '$ Creatinine 0.44 - 1.00 mg/dL 0.83 0.97 0.93  Sodium 135 - 145 mmol/L 141 144 140  Potassium 3.5 - 5.1 mmol/L 3.3(L) 3.3(L) 3.4(L)  Chloride 98 - 111 mmol/L 103 100 103  CO2 22 - 32 mmol/L '28 30 28  '$ Calcium 8.9 - 10.3 mg/dL 9.8 9.8 9.5  Total Protein 6.5 - 8.1 g/dL 6.5 7.1 6.6  Total Bilirubin 0.3 - 1.2  mg/dL 0.5 0.6 0.6  Alkaline Phos 38 - 126 U/L 58 59 49  AST 15 - 41 U/L '21 23 19  '$ ALT 0 - 44 U/L '18 25 18   '$ 7/82/42 JAK2:    RADIOGRAPHIC STUDIES: I have personally reviewed the radiological images as listed and agreed with the findings in the report. No results found.  ASSESSMENT & PLAN:  79 y.o. female with  1. 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  2. 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 S/p Lumpectomy, Radiation, 4 cycles of Cytoxan and Taxotere. Completed maintenance Arimidex until Fall 2019 Continues with annual mammograms with OBGYN Dr. Donalynn Furlong.  PLAN: -Discussed pt labwork today, 10/21/19; of CBC w/diff and CMP is as follows: all values are WNL except for RDW at 15.6, Potassium at 3.3 -Discussed 10/21/19 of LDH at 286 -Advised on diastolic HTN with DBP being elevated- salt intake, stress can affect the number  -The pt has no prohibitive toxicities from continuing '500mg'$  Hydroxyurea daily, at this time. -Will continue with  labs every 3-4 months, will space it out to every 6 months if labs are done in between by PCP or other provider.  -Will continue at 20 mg Xarelto daily  -Reviewed watching weight loss to determine if Hydroxyurea and Xarelto dosage is appropriate. -Recommend pt eat as much as possible, continue using high-calorie, high-protein Ensure -Recommends getting Shingrix vaccine when available to the pt -Recommends pursuing skin protection, long flowing clothing -Continue to f/u with PCP  -Will see back sooner if needed -Will see back in 4 month   FOLLOW UP: RTC with Dr Irene Limbo with labs in 4 months  The total time spent in the appt was 20 minutes and more than 50% was on counseling and direct patient cares.  All of the patient's questions  were answered with apparent satisfaction. The patient knows to call the clinic with any problems, questions or concerns.  Sullivan Lone MD MS AAHIVMS [facility] Hematology/Oncology Physician [facility]  (Office):       [phone] (Work cell):  [phone] (Fax):           [phone]  10/21/2019 12:59 PM  I, Dawayne Cirri am acting as a Education administrator for Dr. Sullivan Lone.   .I have reviewed the above documentation for accuracy and completeness, and I agree with the above. Brunetta Genera MD

## 2019-10-21 ENCOUNTER — Inpatient Hospital Stay (HOSPITAL_BASED_OUTPATIENT_CLINIC_OR_DEPARTMENT_OTHER): Payer: Medicare Other | Admitting: Hematology

## 2019-10-21 ENCOUNTER — Inpatient Hospital Stay: Payer: Medicare Other | Attending: Hematology

## 2019-10-21 ENCOUNTER — Telehealth: Payer: Self-pay | Admitting: Hematology

## 2019-10-21 ENCOUNTER — Other Ambulatory Visit: Payer: Self-pay

## 2019-10-21 ENCOUNTER — Other Ambulatory Visit: Payer: Self-pay | Admitting: Hematology

## 2019-10-21 VITALS — BP 134/92 | HR 79 | Temp 97.8°F | Resp 18 | Ht 59.0 in | Wt 97.0 lb

## 2019-10-21 DIAGNOSIS — Z853 Personal history of malignant neoplasm of breast: Secondary | ICD-10-CM | POA: Insufficient documentation

## 2019-10-21 DIAGNOSIS — D473 Essential (hemorrhagic) thrombocythemia: Secondary | ICD-10-CM

## 2019-10-21 DIAGNOSIS — Z801 Family history of malignant neoplasm of trachea, bronchus and lung: Secondary | ICD-10-CM | POA: Insufficient documentation

## 2019-10-21 DIAGNOSIS — Z808 Family history of malignant neoplasm of other organs or systems: Secondary | ICD-10-CM | POA: Insufficient documentation

## 2019-10-21 DIAGNOSIS — Z803 Family history of malignant neoplasm of breast: Secondary | ICD-10-CM | POA: Insufficient documentation

## 2019-10-21 DIAGNOSIS — Z8249 Family history of ischemic heart disease and other diseases of the circulatory system: Secondary | ICD-10-CM | POA: Diagnosis not present

## 2019-10-21 DIAGNOSIS — M8580 Other specified disorders of bone density and structure, unspecified site: Secondary | ICD-10-CM | POA: Insufficient documentation

## 2019-10-21 DIAGNOSIS — K55069 Acute infarction of intestine, part and extent unspecified: Secondary | ICD-10-CM | POA: Diagnosis not present

## 2019-10-21 DIAGNOSIS — K224 Dyskinesia of esophagus: Secondary | ICD-10-CM | POA: Insufficient documentation

## 2019-10-21 DIAGNOSIS — Z842 Family history of other diseases of the genitourinary system: Secondary | ICD-10-CM | POA: Insufficient documentation

## 2019-10-21 DIAGNOSIS — Z79899 Other long term (current) drug therapy: Secondary | ICD-10-CM | POA: Diagnosis not present

## 2019-10-21 DIAGNOSIS — Z87891 Personal history of nicotine dependence: Secondary | ICD-10-CM | POA: Diagnosis not present

## 2019-10-21 DIAGNOSIS — Z8052 Family history of malignant neoplasm of bladder: Secondary | ICD-10-CM | POA: Insufficient documentation

## 2019-10-21 DIAGNOSIS — Z818 Family history of other mental and behavioral disorders: Secondary | ICD-10-CM | POA: Insufficient documentation

## 2019-10-21 DIAGNOSIS — Z809 Family history of malignant neoplasm, unspecified: Secondary | ICD-10-CM | POA: Insufficient documentation

## 2019-10-21 DIAGNOSIS — R0602 Shortness of breath: Secondary | ICD-10-CM | POA: Diagnosis not present

## 2019-10-21 LAB — CMP (CANCER CENTER ONLY)
ALT: 18 U/L (ref 0–44)
AST: 21 U/L (ref 15–41)
Albumin: 3.6 g/dL (ref 3.5–5.0)
Alkaline Phosphatase: 58 U/L (ref 38–126)
Anion gap: 10 (ref 5–15)
BUN: 20 mg/dL (ref 8–23)
CO2: 28 mmol/L (ref 22–32)
Calcium: 9.8 mg/dL (ref 8.9–10.3)
Chloride: 103 mmol/L (ref 98–111)
Creatinine: 0.83 mg/dL (ref 0.44–1.00)
GFR, Est AFR Am: >60 mL/min (ref >60)
GFR, Estimated: >60 mL/min (ref >60)
Glucose, Bld: 85 mg/dL (ref 70–99)
Potassium: 3.3 mmol/L — ABNORMAL LOW (ref 3.5–5.1)
Sodium: 141 mmol/L (ref 135–145)
Total Bilirubin: 0.5 mg/dL (ref 0.3–1.2)
Total Protein: 6.5 g/dL (ref 6.5–8.1)

## 2019-10-21 LAB — CBC WITH DIFFERENTIAL/PLATELET
Abs Immature Granulocytes: 0.04 10*3/uL (ref 0.00–0.07)
Basophils Absolute: 0.1 10*3/uL (ref 0.0–0.1)
Basophils Relative: 1 %
Eosinophils Absolute: 0.1 10*3/uL (ref 0.0–0.5)
Eosinophils Relative: 2 %
HCT: 39 % (ref 36.0–46.0)
Hemoglobin: 12.1 g/dL (ref 12.0–15.0)
Immature Granulocytes: 1 %
Lymphocytes Relative: 12 %
Lymphs Abs: 0.8 10*3/uL (ref 0.7–4.0)
MCH: 29.2 pg (ref 26.0–34.0)
MCHC: 31 g/dL (ref 30.0–36.0)
MCV: 94 fL (ref 80.0–100.0)
Monocytes Absolute: 0.3 10*3/uL (ref 0.1–1.0)
Monocytes Relative: 4 %
Neutro Abs: 5.6 10*3/uL (ref 1.7–7.7)
Neutrophils Relative %: 80 %
Platelets: 375 10*3/uL (ref 150–400)
RBC: 4.15 MIL/uL (ref 3.87–5.11)
RDW: 15.6 % — ABNORMAL HIGH (ref 11.5–15.5)
WBC: 6.9 10*3/uL (ref 4.0–10.5)
nRBC: 0 % (ref 0.0–0.2)

## 2019-10-21 LAB — LACTATE DEHYDROGENASE: LDH: 286 U/L — ABNORMAL HIGH (ref 98–192)

## 2019-10-21 NOTE — Telephone Encounter (Signed)
Scheduled per 07/23 los, patient received updated calender.

## 2019-10-21 NOTE — Progress Notes (Unsigned)
Cbc

## 2019-10-25 DIAGNOSIS — N183 Chronic kidney disease, stage 3 unspecified: Secondary | ICD-10-CM | POA: Diagnosis not present

## 2019-10-25 DIAGNOSIS — Z853 Personal history of malignant neoplasm of breast: Secondary | ICD-10-CM | POA: Diagnosis not present

## 2019-10-25 DIAGNOSIS — J471 Bronchiectasis with (acute) exacerbation: Secondary | ICD-10-CM | POA: Diagnosis not present

## 2019-10-25 DIAGNOSIS — I129 Hypertensive chronic kidney disease with stage 1 through stage 4 chronic kidney disease, or unspecified chronic kidney disease: Secondary | ICD-10-CM | POA: Diagnosis not present

## 2019-10-25 DIAGNOSIS — J479 Bronchiectasis, uncomplicated: Secondary | ICD-10-CM | POA: Diagnosis not present

## 2019-10-25 DIAGNOSIS — I2510 Atherosclerotic heart disease of native coronary artery without angina pectoris: Secondary | ICD-10-CM | POA: Diagnosis not present

## 2019-10-25 DIAGNOSIS — M169 Osteoarthritis of hip, unspecified: Secondary | ICD-10-CM | POA: Diagnosis not present

## 2019-10-25 DIAGNOSIS — E7800 Pure hypercholesterolemia, unspecified: Secondary | ICD-10-CM | POA: Diagnosis not present

## 2019-10-25 DIAGNOSIS — M810 Age-related osteoporosis without current pathological fracture: Secondary | ICD-10-CM | POA: Diagnosis not present

## 2019-11-17 DIAGNOSIS — Z23 Encounter for immunization: Secondary | ICD-10-CM | POA: Diagnosis not present

## 2019-11-22 ENCOUNTER — Ambulatory Visit (INDEPENDENT_AMBULATORY_CARE_PROVIDER_SITE_OTHER): Payer: Medicare Other | Admitting: Cardiovascular Disease

## 2019-11-22 ENCOUNTER — Encounter: Payer: Self-pay | Admitting: Cardiovascular Disease

## 2019-11-22 ENCOUNTER — Other Ambulatory Visit: Payer: Self-pay

## 2019-11-22 VITALS — BP 150/94 | HR 62 | Ht 59.0 in | Wt 95.6 lb

## 2019-11-22 DIAGNOSIS — R0609 Other forms of dyspnea: Secondary | ICD-10-CM

## 2019-11-22 DIAGNOSIS — I10 Essential (primary) hypertension: Secondary | ICD-10-CM

## 2019-11-22 DIAGNOSIS — E7800 Pure hypercholesterolemia, unspecified: Secondary | ICD-10-CM

## 2019-11-22 DIAGNOSIS — K55069 Acute infarction of intestine, part and extent unspecified: Secondary | ICD-10-CM | POA: Diagnosis not present

## 2019-11-22 DIAGNOSIS — Z5181 Encounter for therapeutic drug level monitoring: Secondary | ICD-10-CM

## 2019-11-22 DIAGNOSIS — R0600 Dyspnea, unspecified: Secondary | ICD-10-CM

## 2019-11-22 DIAGNOSIS — Z7901 Long term (current) use of anticoagulants: Secondary | ICD-10-CM | POA: Diagnosis not present

## 2019-11-22 LAB — COMPREHENSIVE METABOLIC PANEL
ALT: 15 IU/L (ref 0–32)
AST: 18 IU/L (ref 0–40)
Albumin/Globulin Ratio: 2.3 — ABNORMAL HIGH (ref 1.2–2.2)
Albumin: 4.4 g/dL (ref 3.7–4.7)
Alkaline Phosphatase: 66 IU/L (ref 48–121)
BUN/Creatinine Ratio: 23 (ref 12–28)
BUN: 21 mg/dL (ref 8–27)
Bilirubin Total: 0.5 mg/dL (ref 0.0–1.2)
CO2: 24 mmol/L (ref 20–29)
Calcium: 9.5 mg/dL (ref 8.7–10.3)
Chloride: 101 mmol/L (ref 96–106)
Creatinine, Ser: 0.9 mg/dL (ref 0.57–1.00)
GFR calc Af Amer: 72 mL/min/{1.73_m2} (ref >59)
GFR calc non Af Amer: 62 mL/min/{1.73_m2} (ref >59)
Globulin, Total: 1.9 g/dL (ref 1.5–4.5)
Glucose: 75 mg/dL (ref 65–99)
Potassium: 3.7 mmol/L (ref 3.5–5.2)
Sodium: 141 mmol/L (ref 134–144)
Total Protein: 6.3 g/dL (ref 6.0–8.5)

## 2019-11-22 LAB — LIPID PANEL
Chol/HDL Ratio: 2.5 ratio (ref 0.0–4.4)
Cholesterol, Total: 176 mg/dL (ref 100–199)
HDL: 70 mg/dL (ref >39)
LDL Chol Calc (NIH): 89 mg/dL (ref 0–99)
Triglycerides: 97 mg/dL (ref 0–149)
VLDL Cholesterol Cal: 17 mg/dL (ref 5–40)

## 2019-11-22 MED ORDER — AMLODIPINE BESYLATE 2.5 MG PO TABS: 2.5000 mg | ORAL_TABLET | Freq: Every day | ORAL | 3 refills | Status: DC

## 2019-11-22 NOTE — Progress Notes (Signed)
Cardiology Office Note   Date:  11/22/2019   ID:  INGA NOLLER, DOB 03/11/1941, MRN [id number]  PCP:  Gaynelle Arabian, MD  Cardiologist:   Skeet Latch, MD   No chief complaint on file.    History of Present Illness: Gwendolyn Williams is a 79 y.o. 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  She was initially seen 10/4 for an evaluation of shortness of breath.  Ms. Wiers saw Dr. Hughie Closs on 8/26 and reported exertional dyspnea when lifting weights or walking up hills.  She had a chest x-ray that showed some changes consistent with asthma.  She was referred for an echo 10/2017 that revealed LVEF 55 to 60% with grade 1 diastolic dysfunction.  She was referred to cardiology for further evaluation.  She has been on Arimadex for the last 10 years and initially thought her shortness of breath was attributable to this medication.She was referred for an ETT 01/07/18 that was negative for ischemia.  She achieved 4.3 METS.  She had a coronary CT that was concerning for proximal LAD stenosis.  FFR of that region was 0.64.  She underwent left heart catheterization 01/17/2018 that revealed 30% proximal and 50% mid LAD stenoses.  She also had 50% OM 2.  FFR of the LAD was nonsignificant.  It was felt that CAD was not causing her symptoms.  She was started on Imdur 30 mg.  She spoke with Dr. Beryle Beams who was concerned that anastrozole could be causing her dyspnea and recommended that she stop taking it.  She was referred to pulmonology and started [facility] for bronchiectasis with improvement in her symptoms.  Ms. Casella had myalgias on atorvastatin and switched to rosuvastatin.  She has been tolerating this well.  At her last appointment her exertional dyspnea had improved with treatment of her  bronchiectasis.  Her blood pressure was poorly controlled but she felt that it was controlled at home in 1 to track her numbers and bring them in.  She followed up with Kerin Ransom on 04/2019 and noted that her average blood pressure was 120/80.  No changes were made to her medications at that time.  Since then she has not been checking it as regularly.  When she does she notices that it is usually in the 120s to 130s over 80s.  She has been struggling with the pandemic and is feeling depressed with the current state of affairs.  She had an episode of shingles on her buttocks that has now healed.  She is also frustrated that her sister has been placed in a nursing facility due to having Alzheimer's that is advanced.  She does continue trying to walk daily.  She generally feels well and has no chest pain or shortness of breath.  She has some occasional swelling in her left ankle at times but not right now.  She denies orthopnea or PND.  She notes easy bruising and purpura but no bleeding.   Past Medical History:  Diagnosis Date  . Arthritis   . Asthma    controlled with singulair  . Breast cancer ([place])    stage I left breast  . Current use of long term anticoagulation 04/21/2017  . Dyspnea, unspecified 02/24/2017   Started coincident with Hyd[place], exertional and orthostatic, normal exam. O2 sat 100%.  . Family history of breast cancer   .  GERD (gastroesophageal reflux disease)   . H/O blood clots    clotting disorder since 2010  . Hematoma of arm 08/09/2012  . 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 Hyperlipidemia   . Hypertension   . 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 Pneumonia    viral pneumonia as a kid  . PONV (postoperative nausea and vomiting)     Past Surgical History:  Procedure Laterality Date  . BREAST SURGERY     Left breast lumpectomy sentinel node biopsy  . CERVICAL SPINE SURGERY   05/23/2008  . COLECTOMY  04/11/2008   left colectomy with end colostomy due to clot  . COLOSTOMY TAKEDOWN  08/14/2008  . EYE SURGERY     bil cataracts  . HERNIA REPAIR  2011   [facility]  . INTRAVASCULAR PRESSURE WIRE/FFR STUDY N/A 01/25/2018   Procedure: INTRAVASCULAR PRESSURE WIRE/FFR STUDY;  Surgeon: Nelva Bush, MD;  Location: [place] [facility];  Service: Cardiovascular;  Laterality: N/A;  . LEFT HEART CATH AND CORONARY ANGIOGRAPHY N/A 01/25/2018   Procedure: LEFT HEART CATH AND CORONARY ANGIOGRAPHY;  Surgeon: Nelva Bush, MD;  Location: [place] [facility];  Service: Cardiovascular;  Laterality: N/A;  . LUMBAR Startup SURGERY  2006  . TOTAL HIP ARTHROPLASTY Left 10/07/2016   Procedure: LEFT TOTAL HIP ARTHROPLASTY ANTERIOR APPROACH;  Surgeon: Paralee Cancel, MD;  Location: [facility];  Service: Orthopedics;  Laterality: Left;  70 mins     Current Outpatient Medications  Medication Sig Dispense Refill  . albuterol (VENTOLIN HFA) 108 (90 Base) MCG/ACT inhaler Inhale 2 puffs into the lungs every 6 (six) hours as needed for wheezing or shortness of breath. 18 g 3  . amLODipine (NORVASC) 5 MG tablet Take 5 mg by mouth daily.    Marland Kitchen aspirin 81 MG tablet Take 81 mg by mouth daily.      Marland Kitchen BREO ELLIPTA 200-25 MCG/INH AEPB USE 1 INHALATION ORALLY    DAILY 60 each 5  . Cholecalciferol (VITAMIN D) 2000 units CAPS Take 4,000 Units by mouth daily.     Marland Kitchen docusate sodium (COLACE) 100 MG capsule Take 1 capsule (100 mg total) by mouth 2 (two) times daily. (Patient taking differently: Take 100 mg by mouth daily. ) 10 capsule 0  . folic acid (FOLVITE) 1 MG tablet Take 1 mg by mouth daily.    . hydroxyurea (HYDREA) 500 MG capsule TAKE ONE CAPSULE BY MOUTH DAILY WITH FOOD TO MINIMIZE GI SIDE EFFECTS 90 capsule 0  . hyoscyamine (LEVSIN SL) 0.125 MG SL tablet Place 0.125 mg under the tongue as needed.    . ondansetron (ZOFRAN) 4 MG tablet Take 1 tablet (4 mg total) by mouth every 6 (six) hours as needed for nausea. 30  tablet 0  . polyethylene glycol (MIRALAX / GLYCOLAX) packet Take 17 g by mouth 2 (two) times daily. (Patient taking differently: Take 17 g by mouth daily. ) 14 each 0  . rosuvastatin (CRESTOR) 20 MG tablet Take 1 tablet (20 mg total) by mouth daily. 90 tablet 2  . Spacer/Aero-Holding Chambers DEVI 1 Device by Does not apply route as directed. 1 each 0  . triamterene-hydrochlorothiazide (DYAZIDE) 37.5-25 MG capsule Take 1 capsule by mouth daily.    Alveda Reasons 20 MG TABS tablet TAKE 1 TABLET DAILY WITH   SUPPER 90 tablet 1  . amLODipine (NORVASC) 2.5 MG tablet Take 1 tablet (2.5 mg total)  by mouth daily. TO BE TAKEN WITH THE 5 MG TABLET TO EQUAL 7.5 MG DAILY 90 tablet 3   No current facility-administered medications for this visit.    Allergies:   Lipitor [atorvastatin calcium], Penicillins, Sulfa antibiotics, and Tamiflu [oseltamivir phosphate]    Social History:  The patient  reports that she quit smoking about 57 years ago. She started smoking about 62 years ago. She has a 5.00 pack-year smoking history. She has never used smokeless tobacco. She reports current alcohol use. She reports that she does not use drugs.   Family History:  The patient's family history includes Basal cell carcinoma in her father; Bladder Cancer in her cousin; Breast cancer (age of onset: 59) in her mother; Breast cancer (age of onset: 39) in her maternal aunt and maternal aunt; Breast cancer (age of onset: 59) in her paternal aunt; CAD in her father; Cancer in an other family member; Dementia in her sister; Heart disease in her father; Lung cancer in her mother; Other (age of onset: 35) in her daughter; Ovarian cysts in her maternal grandmother and sister; Polycystic ovary syndrome in her daughter.    ROS:  Please see the history of present illness.   Otherwise, review of systems are positive for none.   All other systems are reviewed and negative.    PHYSICAL EXAM: VS:  BP (!) 150/94   Pulse 62   Ht 4\' 11"  (1.499  m)   Wt 95 lb 9.6 oz (43.4 kg)   SpO2 92%   BMI 19.31 kg/m  , BMI Body mass index is 19.31 kg/m. GENERAL:  Well appearing HEENT: Pupils equal round and reactive, fundi not visualized, oral mucosa unremarkable NECK:  No jugular venous distention, waveform within normal limits, carotid upstroke brisk and symmetric, no bruits, no thyromegaly LYMPHATICS:  No cervical adenopathy LUNGS:  Clear to auscultation bilaterally HEART:  RRR.  PMI not displaced or sustained,S1 and S2 within normal limits, no S3, no S4, no clicks, no rubs, II/VI systolic murmur at the LUSB ABD:  Flat, positive bowel sounds normal in frequency in pitch, no bruits, no rebound, no guarding, no midline pulsatile mass, no hepatomegaly, no splenomegaly EXT:  2 plus pulses throughout, no edema, no cyanosis no clubbing SKIN:  No rashes no nodules.  Multiple ecchymoses  NEURO:  Cranial nerves II through XII grossly intact, motor grossly intact throughout PSYCH:  Cognitively intact, oriented to person place and time   EKG:  EKG is ordered today. The ekg ordered 01/01/18 demonstrates sinus rhythm.  Rate 66 bpm.   03/15/19: Sinus rhythm.  Rate 66 bpm.  11/22/2019: Sinus rhythm.  Rate 62 bpm.  Cannot rule out prior septal infarct.  Echo 11/26/2017: Study Conclusions  - Left ventricle: The cavity size was normal. There was mild focal   basal hypertrophy of the septum. Systolic function was normal.   The estimated ejection fraction was in the range of 55% to 60%.   Wall motion was normal; there were no regional wall motion   abnormalities. Doppler parameters are consistent with abnormal   left ventricular relaxation (grade 1 diastolic dysfunction). - Aortic valve: There was mild regurgitation.  ETT 01/07/18:  Blood pressure demonstrated a hypotensive response to exercise.  There was no ST segment deviation noted during stress.  No T wave inversion was noted during stress.  Poor exercise capacity.  Negative, adequate  stress test.   Coronary CT-A 01/18/18: IMPRESSION: 1. Coronary artery calcium score 667 Agatston units. This places the patient  in the 90th percentile for age and gender, suggesting high risk for future cardiac events.  2. Suspect 50% mid LCx stenosis and moderate (50-75%) proximal LAD stenosis. Will send for CT FFR.  FINDINGS: FFR 0.64 mid LAD  FFR 0.94 mid RCA  FFR 0.9 mid LCx  IMPRESSION: Hemodynamically significant proximal LAD stenosis.   Recent Labs: 10/21/2019: ALT 18; BUN 20; Creatinine 0.83; Hemoglobin 12.1; Platelets 375; Potassium 3.3; Sodium 141   11/23/2017: Total cholesterol 200, triglycerides 151, HDL 57, LDL 113 Sodium 142, potassium 4.6, BUN 31, creatinine 1.3 AST 15, ALT 12    Lipid Panel    Component Value Date/Time   CHOL 160 09/06/2018 0950   TRIG 126 09/06/2018 0950   HDL 62 09/06/2018 0950   CHOLHDL 2.6 09/06/2018 0950   LDLCALC 73 09/06/2018 0950      Wt Readings from Last 3 Encounters:  11/22/19 95 lb 9.6 oz (43.4 kg)  10/21/19 (!) 97 lb (44 kg)  08/19/19 93 lb 2 oz (42.2 kg)      ASSESSMENT AND PLAN:  # Exertional dyspnea: # Bronchiectasis:  Improved with Breo.  This was attributable to bronchiectasis.  # CAD:  # Hyperlipidemia:  She has moderate, nonobstructive disease in the LAD and OM 2.  Continue apirin and rosuvastatin.  LDL goal <70.  Check lipids and a CMP today.   # Myeloproliferative disorder: # Prior mesenteric thrombus: On aspirin and Xarelto for secondary prevention of thromboembolic disease.  She follows with Dr. Beryle Beams.   # Hypertension: BP is not controlled-controlled and it has been high at home as well.  She did not take her blood pressure medication yet today.  We will increase amlodipine to 7.5 mg.  Continue HCTZ triamterene.  She will continue to track her blood pressure at home and call us if it does not coming down below 120/80.    Current medicines are reviewed at length with the patient today.   The patient does not have concerns regarding medicines.  The following changes have been made:  no change  Labs/ tests ordered today include:   Orders Placed This Encounter  Procedures  . Lipid panel  . Comprehensive metabolic panel  . EKG 12-Lead     Disposition:   FU with Danalee Flath C. Oval Linsey, MD, [facility] in 1 year.     Signed, Christhoper Busbee C. Oval Linsey, MD, [facility]  11/22/2019 10:34 AM    [place]

## 2019-11-22 NOTE — Patient Instructions (Addendum)
Medication Instructions:  INCREASE YOUR AMLODIPINE TO 7.5 MG DAILY (TAKE A 5 MG AND A 2.5 MG TABLET)   *If you need a refill on your cardiac medications before your next appointment, please call your pharmacy*  Lab Work: FASTING LP/CMET  If you have labs (blood work) drawn today and your tests are completely normal, you will receive your results only by: Marland Kitchen MyChart Message (if you have MyChart) OR . A paper copy in the mail If you have any lab test that is abnormal or we need to change your treatment, we will call you to review the results.  Testing/Procedures: NONE   Follow-Up: At [facility], you and your health needs are our priority.  As part of our continuing mission to provide you with exceptional heart care, we have created designated Provider Care Teams.  These Care Teams include your primary Cardiologist (physician) and Advanced Practice Providers (APPs -  Physician Assistants and Nurse Practitioners) who all work together to provide you with the care you need, when you need it.  We recommend signing up for the patient portal called "MyChart".  Sign up information is provided on this After Visit Summary.  MyChart is used to connect with patients for Virtual Visits (Telemedicine).  Patients are able to view lab/test results, encounter notes, upcoming appointments, etc.  Non-urgent messages can be sent to your provider as well.   To learn more about what you can do with MyChart, go to [email or web address].    Your next appointment:   12 month(s)  You will receive a reminder letter in the mail two months in advance. If you don't receive a letter, please call our office to schedule the follow-up appointment.  The format for your next appointment:   In Person  Provider:   You may see Skeet Latch, MD or one of the following Advanced Practice Providers on your designated Care Team:    Kerin Ransom, PA-C  [place], [place]  Coletta Memos, [place]    Other  Instructions   MONITOR YOUR BLOOD PRESSURE AT HOME, IF IT IS NOT STAYING UNDER 130/80 CALL THE OFFICE AT [phone]

## 2019-11-25 ENCOUNTER — Telehealth: Payer: Self-pay | Admitting: Cardiovascular Disease

## 2019-11-25 DIAGNOSIS — E7800 Pure hypercholesterolemia, unspecified: Secondary | ICD-10-CM

## 2019-11-25 DIAGNOSIS — Z5181 Encounter for therapeutic drug level monitoring: Secondary | ICD-10-CM

## 2019-11-25 DIAGNOSIS — I10 Essential (primary) hypertension: Secondary | ICD-10-CM

## 2019-11-25 MED ORDER — ROSUVASTATIN CALCIUM 40 MG PO TABS
40.0000 mg | ORAL_TABLET | Freq: Every day | ORAL | 3 refills | Status: DC
Start: 2019-11-25 — End: 2020-09-24

## 2019-11-25 NOTE — Telephone Encounter (Signed)
Patient returning call for lab results. 

## 2019-11-25 NOTE — Telephone Encounter (Signed)
-----   Message from Skeet Latch, MD sent at 11/22/2019  5:57 PM EDT ----- Cholesterol levels are pretty good but LDL should be <70.  Increase rosuvastatin to 40 mg.  Repeat lipids/CMP in 3 months.

## 2019-11-25 NOTE — Telephone Encounter (Signed)
Advised patient of lab results and mailed lab orders  

## 2019-12-02 DIAGNOSIS — C44319 Basal cell carcinoma of skin of other parts of face: Secondary | ICD-10-CM | POA: Diagnosis not present

## 2019-12-02 DIAGNOSIS — L309 Dermatitis, unspecified: Secondary | ICD-10-CM | POA: Diagnosis not present

## 2019-12-02 DIAGNOSIS — D485 Neoplasm of uncertain behavior of skin: Secondary | ICD-10-CM | POA: Diagnosis not present

## 2019-12-16 ENCOUNTER — Other Ambulatory Visit: Payer: Self-pay | Admitting: *Deleted

## 2019-12-16 ENCOUNTER — Other Ambulatory Visit (HOSPITAL_COMMUNITY): Payer: Medicare Other

## 2019-12-16 DIAGNOSIS — J4521 Mild intermittent asthma with (acute) exacerbation: Secondary | ICD-10-CM

## 2019-12-19 ENCOUNTER — Ambulatory Visit (INDEPENDENT_AMBULATORY_CARE_PROVIDER_SITE_OTHER): Payer: Medicare Other | Admitting: Pulmonary Disease

## 2019-12-19 ENCOUNTER — Other Ambulatory Visit: Payer: Self-pay

## 2019-12-19 ENCOUNTER — Encounter: Payer: Self-pay | Admitting: *Deleted

## 2019-12-19 DIAGNOSIS — J4521 Mild intermittent asthma with (acute) exacerbation: Secondary | ICD-10-CM

## 2019-12-19 LAB — PULMONARY FUNCTION TEST
DL/VA % pred: 97 %
DL/VA: 4.13 ml/min/mmHg/L
DLCO cor % pred: 106 %
DLCO cor: 16.91 ml/min/mmHg
DLCO unc % pred: 106 %
DLCO unc: 16.91 ml/min/mmHg
FEF 25-75 Post: 3.74 L/sec
FEF 25-75 Pre: 2.66 L/sec
FEF2575-%Change-Post: 40 %
FEF2575-%Pred-Post: 291 %
FEF2575-%Pred-Pre: 206 %
FEV1-%Change-Post: -1 %
FEV1-%Pred-Post: 142 %
FEV1-%Pred-Pre: 144 %
FEV1-Post: 2.27 L
FEV1-Pre: 2.3 L
FEV1FVC-%Change-Post: -4 %
FEV1FVC-%Pred-Pre: 124 %
FEV6-%Change-Post: 0 %
FEV6-%Pred-Post: 123 %
FEV6-%Pred-Pre: 122 %
FEV6-Post: 2.5 L
FEV6-Pre: 2.48 L
FEV6FVC-%Pred-Post: 106 %
FEV6FVC-%Pred-Pre: 106 %
FVC-%Change-Post: 3 %
FVC-%Pred-Post: 119 %
FVC-%Pred-Pre: 115 %
FVC-Post: 2.57 L
FVC-Pre: 2.48 L
Post FEV1/FVC ratio: 88 %
Post FEV6/FVC ratio: 100 %
Pre FEV1/FVC ratio: 93 %
Pre FEV6/FVC Ratio: 100 %
RV % pred: 85 %
RV: 1.77 L
TLC % pred: 104 %
TLC: 4.5 L

## 2019-12-19 NOTE — Progress Notes (Signed)
Full PFT performed today. °

## 2019-12-27 DIAGNOSIS — J479 Bronchiectasis, uncomplicated: Secondary | ICD-10-CM | POA: Diagnosis not present

## 2019-12-27 DIAGNOSIS — M810 Age-related osteoporosis without current pathological fracture: Secondary | ICD-10-CM | POA: Diagnosis not present

## 2019-12-27 DIAGNOSIS — J471 Bronchiectasis with (acute) exacerbation: Secondary | ICD-10-CM | POA: Diagnosis not present

## 2019-12-27 DIAGNOSIS — N183 Chronic kidney disease, stage 3 unspecified: Secondary | ICD-10-CM | POA: Diagnosis not present

## 2019-12-27 DIAGNOSIS — Z853 Personal history of malignant neoplasm of breast: Secondary | ICD-10-CM | POA: Diagnosis not present

## 2019-12-27 DIAGNOSIS — I129 Hypertensive chronic kidney disease with stage 1 through stage 4 chronic kidney disease, or unspecified chronic kidney disease: Secondary | ICD-10-CM | POA: Diagnosis not present

## 2019-12-27 DIAGNOSIS — E7800 Pure hypercholesterolemia, unspecified: Secondary | ICD-10-CM | POA: Diagnosis not present

## 2019-12-27 DIAGNOSIS — I2510 Atherosclerotic heart disease of native coronary artery without angina pectoris: Secondary | ICD-10-CM | POA: Diagnosis not present

## 2019-12-27 DIAGNOSIS — M169 Osteoarthritis of hip, unspecified: Secondary | ICD-10-CM | POA: Diagnosis not present

## 2020-01-05 DIAGNOSIS — I81 Portal vein thrombosis: Secondary | ICD-10-CM | POA: Diagnosis not present

## 2020-01-05 DIAGNOSIS — M810 Age-related osteoporosis without current pathological fracture: Secondary | ICD-10-CM | POA: Diagnosis not present

## 2020-01-05 DIAGNOSIS — J479 Bronchiectasis, uncomplicated: Secondary | ICD-10-CM | POA: Diagnosis not present

## 2020-01-05 DIAGNOSIS — I129 Hypertensive chronic kidney disease with stage 1 through stage 4 chronic kidney disease, or unspecified chronic kidney disease: Secondary | ICD-10-CM | POA: Diagnosis not present

## 2020-01-05 DIAGNOSIS — K219 Gastro-esophageal reflux disease without esophagitis: Secondary | ICD-10-CM | POA: Diagnosis not present

## 2020-01-05 DIAGNOSIS — Z853 Personal history of malignant neoplasm of breast: Secondary | ICD-10-CM | POA: Diagnosis not present

## 2020-01-05 DIAGNOSIS — E7800 Pure hypercholesterolemia, unspecified: Secondary | ICD-10-CM | POA: Diagnosis not present

## 2020-01-05 DIAGNOSIS — Z23 Encounter for immunization: Secondary | ICD-10-CM | POA: Diagnosis not present

## 2020-01-05 DIAGNOSIS — C946 Myelodysplastic disease, not classified: Secondary | ICD-10-CM | POA: Diagnosis not present

## 2020-01-05 DIAGNOSIS — N183 Chronic kidney disease, stage 3 unspecified: Secondary | ICD-10-CM | POA: Diagnosis not present

## 2020-01-07 ENCOUNTER — Other Ambulatory Visit: Payer: Self-pay | Admitting: Hematology

## 2020-01-07 NOTE — Progress Notes (Signed)
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  S/P surgical hernia surgery 2011. She was last seen in our office by Ellouise Newer PA-C on 08/19/2019 secondary to having "rectal hematomas" which occurred following a shingles outbreak to the right buttock. She was assessed to have external hemorrhoid without thrombosis and anal nodules thought to be sequelae of recent shingles. She was treated with RectiCare and sitz baths and her symptoms resolved. She uses Lidocaine ointment daily since then for minor sensitivity. "Feels like nerve damage". No significant anal pain. She had recent fecal leakage which resolved. She presents today with complaints of a change in her bowl pattern since she was diagnosed with shingles 07/2019. She is quite concerned regarding this change in bowel pattern. She reports passing 1 to 3 more narrow brown formed stools daily. She has lower abdominal bloat which comes and goes and infrequent LLQ discomfort. No current LLQ pain. She is taking Miralax daily since her left colectomy surgery.  No rectal bleeding. No black colored stools. Her most recent colonoscopy was done by Dr. Amedeo Plenty with [facility] which was possibly done in 2016. She stated her colonoscopy was normal and she was advised no further colonoscopies were recommended due to her age.  I will request a copy of this colonoscopy report for further review.  She underwent an EGD by Dr. Michail Sermon 03/2018 due to having atypical chest pain which showed: Esophagus was normal the Z line was normal there was mild edema in the distal stomach and  biopsies were taken.  She had a small hiatal hernia.  Pathology showed "chronic inactive gastritis.  Negative for H. Pylori". She reports her current weight of 94 lbs is stable for her. No fever, sweats or chills. She remains on Hydroxyurea for thrombocytosis and Xarelto and ASA secondary to having a mesenteric thrombosis as noted above. She is followed by hematologist Dr. Irene Limbo.  No other complaints today.   CBC Latest Ref Rng & Units 10/21/2019 07/22/2019 04/18/2019  WBC 4.0 - 10.5 K/uL 6.9 8.0 7.4  Hemoglobin 12.0 - 15.0 g/dL 12.1 13.4 13.1  Hematocrit 36 - 46 % 39.0 43.1 39.7  Platelets 150 - 400 K/uL 375 314 379    CMP Latest Ref Rng & Units 11/22/2019 10/21/2019 07/22/2019  Glucose 65 - 99 mg/dL 75 85 75  BUN 8 - 27 mg/dL 21 20 22   Creatinine 0.57 - 1.00 mg/dL 0.90 0.83 0.97  Sodium 134 - 144 mmol/L 141 141 144  Potassium 3.5 - 5.2 mmol/L 3.7 3.3(L) 3.3(L)  Chloride 96 - 106 mmol/L 101 103 100  CO2 20 - 29 mmol/L 24 28 30   Calcium 8.7 - 10.3 mg/dL 9.5 9.8 9.8  Total Protein 6.0 - 8.5 g/dL 6.3 6.5 7.1  Total Bilirubin 0.0 - 1.2 mg/dL 0.5 0.5 0.6  Alkaline Phos 48 - 121 IU/L 66 58 59  AST 0 - 40 IU/L 18 21 23   ALT 0 - 32 IU/L 15 18 25    Past Medical History:  Diagnosis Date  . Arthritis   . Asthma    controlled with singulair  . Breast cancer ([place])    stage I  left breast  . Current use of long term anticoagulation 04/21/2017  . Dyspnea, unspecified 02/24/2017   Started coincident with Hydrea, exertional and orthostatic, normal exam. O2 sat 100%.  . Family history of breast cancer   . GERD (gastroesophageal reflux disease)   . H/O blood clots    clotting disorder since 2010  . Hematoma of arm 08/09/2012  . 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 Hyperlipidemia   . Hypertension   . 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 Pneumonia    viral pneumonia as a kid  . PONV (postoperative nausea and  vomiting)      Past Surgical History:  Procedure Laterality Date  . BREAST SURGERY     Left breast lumpectomy sentinel node biopsy  . CERVICAL SPINE SURGERY  05/23/2008  . COLECTOMY  04/11/2008   left colectomy with end colostomy due to clot  . COLOSTOMY TAKEDOWN  08/14/2008  . EYE SURGERY     bil cataracts  . HERNIA REPAIR  2011   [facility]  . INTRAVASCULAR PRESSURE WIRE/FFR STUDY N/A 01/25/2018   Procedure: INTRAVASCULAR PRESSURE WIRE/FFR STUDY;  Surgeon: Nelva Bush, MD;  Location: [facility];  Service: Cardiovascular;  Laterality: N/A;  . LEFT HEART CATH AND CORONARY ANGIOGRAPHY N/A 01/25/2018   Procedure: LEFT HEART CATH AND CORONARY ANGIOGRAPHY;  Surgeon: Nelva Bush, MD;  Location: [facility];  Service: Cardiovascular;  Laterality: N/A;  . LUMBAR [place] SURGERY  2006  . TOTAL HIP ARTHROPLASTY Left 10/07/2016   Procedure: LEFT TOTAL HIP ARTHROPLASTY ANTERIOR APPROACH;  Surgeon: Paralee Cancel, MD;  Location: [facility];  Service: Orthopedics;  Laterality: Left;  70 mins    Current Outpatient Medications on File Prior to Visit  Medication Sig Dispense Refill  . albuterol (VENTOLIN HFA) 108 (90 Base) MCG/ACT inhaler Inhale 2 puffs into the lungs every 6 (six) hours as needed for wheezing or shortness of breath. 18 g 3  . amLODipine (NORVASC) 2.5 MG tablet Take 1 tablet (2.5 mg total) by mouth daily. TO BE TAKEN WITH THE 5 MG TABLET TO EQUAL 7.5 MG DAILY 90 tablet 3  . aspirin 81 MG tablet Take 81 mg by mouth daily.      Marland Kitchen BREO ELLIPTA 200-25 MCG/INH AEPB USE 1 INHALATION ORALLY    DAILY 60 each 5  . Cholecalciferol (VITAMIN D) 2000 units CAPS Take 4,000 Units by mouth daily.     Marland Kitchen docusate sodium (COLACE) 100 MG capsule Take 1 capsule (100 mg total) by mouth 2 (two) times daily. (Patient taking differently: Take 100 mg by mouth daily. ) 10 capsule 0  . folic acid (FOLVITE) 1 MG tablet Take 1 mg by mouth daily.    . hydroxyurea (HYDREA) 500 MG capsule TAKE ONE CAPSULE BY  MOUTH DAILY WITH FOOD TO MINIMIZE GI SIDE EFFECTS 90 capsule 0  . hyoscyamine (LEVSIN SL) 0.125 MG SL tablet Place 0.125 mg under the tongue as needed.    . ondansetron (ZOFRAN) 4 MG tablet Take 1 tablet (4 mg total) by mouth every 6 (six) hours as needed for nausea. 30 tablet 0  . polyethylene glycol (MIRALAX / GLYCOLAX) packet Take 17 g by mouth 2 (two) times daily. (Patient taking differently: Take 17 g by mouth daily. ) 14 each 0  . rosuvastatin (CRESTOR) 40 MG tablet Take 1 tablet (40 mg total) by mouth daily. 90 tablet 3  .  Spacer/Aero-Holding Chambers DEVI 1 Device by Does not apply route as directed. 1 each 0  . triamterene-hydrochlorothiazide (DYAZIDE) 37.5-25 MG capsule Take 1 capsule by mouth daily.    Alveda Reasons 20 MG TABS tablet TAKE 1 TABLET DAILY WITH   SUPPER 90 tablet 1  . amLODipine (NORVASC) 5 MG tablet Take 5 mg by mouth daily.     No current facility-administered medications on file prior to visit.    Allergies  Allergen Reactions  . Lipitor [Atorvastatin Calcium]     Weakness in legs   . Penicillins Hives    Has patient had a PCN reaction causing immediate rash, facial/tongue/throat swelling, SOB or lightheadedness with hypotension: Unknown Has patient had a PCN reaction causing severe rash involving mucus membranes or skin necrosis: Unknown Has patient had a PCN reaction that required hospitalization: No Has patient had a PCN reaction occurring within the last 10 years: No If all of the above answers are "NO", then may proceed with Cephalosporin use.   . Sulfa Antibiotics Other (See Comments)    unknown  . Tamiflu [Oseltamivir Phosphate] Nausea And Vomiting    Current Medications, Allergies, Past Medical History, Past Surgical History, Family History and Social History were reviewed in [employer] record.   Review of Systems:   Constitutional: Negative for fever, sweats, chills or weight loss.  Respiratory: Negative for shortness of  breath.   Cardiovascular: Negative for chest pain, palpitations and leg swelling.  Gastrointestinal: See HPI.  Musculoskeletal: Negative for back pain or muscle aches.  Neurological: Negative for dizziness, headaches or paresthesias.    Physical Exam: BP 100/60   Pulse 82   Ht 4\' 11"  (1.499 m)   Wt 94 lb 4 oz (42.8 kg)   BMI 19.04 kg/m   Wt Readings from Last 3 Encounters:  01/09/20 94 lb 4 oz (42.8 kg)  11/22/19 95 lb 9.6 oz (43.4 kg)  10/21/19 (!) 97 lb (44 kg)    General: Petite thin 79 year old female in no acute distress. Head: Normocephalic and atraumatic. Eyes: No scleral icterus. Conjunctiva pink . Ears: Normal auditory acuity. Mouth: Dentition intact. No ulcers or lesions.  Lungs: Clear throughout to auscultation. Heart: Regular rate and rhythm, no murmur. Abdomen: Soft, nontender and nondistended. Abdomen is flat with lack of adipose. Three small knots (patient reports past surgical sutures) to her left med abdomen. Large mid abdominal scar intact. No masses or hepatomegaly. Normal bowel sounds x 4 quadrants.  Rectal: Small anterior external hemorrhoids. Soft brown stool in the rectal vault guaiac negative. Significantly diminished anal sphincter tone. No mass.  Musculoskeletal: Symmetrical with no gross deformities. Extremities: No edema. Neurological: Alert oriented x 4. No focal deficits.  Psychological: Alert and cooperative. Normal mood and affect  Assessment and Recommendations:  52. 79 year old female with a change in bowel pattern triggered post shingles outbreak to the right buttock area 07/2019. Stool diameter is more narrow. No rectal bleeding.  -Request copy of last colonoscopy from [facility], possible done in 2016 -Colonoscopy benefits and risks discussed including risk with sedation, risk of bleeding, perforation and infection  -Dr. Ardis Hughs to verify if patient to proceed with a colonoscopy or consider abd/pelvic CT imaging prior to pursing endoscopic  evaluation -Our office will contact Dr. Irene Limbo to verify Xarelto instructions prior to colonoscopy  -Benefiber if tolerated, 1 tsp advance to 1tbs QD to bulk up stool   2. 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 On Xarelto.   3. Thrombocytosis, followed by hematologist Dr. Irene Limbo  4. Personal history of breast cancer   5. Fecal incontinence, resolved -Consider pelvic/rectal physical therapy if fecal incontinence recurs

## 2020-01-09 ENCOUNTER — Telehealth: Payer: Self-pay

## 2020-01-09 ENCOUNTER — Ambulatory Visit (INDEPENDENT_AMBULATORY_CARE_PROVIDER_SITE_OTHER): Payer: Medicare Other | Admitting: Nurse Practitioner

## 2020-01-09 ENCOUNTER — Encounter: Payer: Self-pay | Admitting: Nurse Practitioner

## 2020-01-09 VITALS — BP 100/60 | HR 82 | Ht 59.0 in | Wt 94.2 lb

## 2020-01-09 DIAGNOSIS — Z803 Family history of malignant neoplasm of breast: Secondary | ICD-10-CM | POA: Diagnosis not present

## 2020-01-09 DIAGNOSIS — R194 Change in bowel habit: Secondary | ICD-10-CM | POA: Diagnosis not present

## 2020-01-09 MED ORDER — PLENVU 140 G PO SOLR: 1.0000 | ORAL | 0 refills | Status: DC

## 2020-01-09 NOTE — Patient Instructions (Addendum)
If you are age 77 or older, your body mass index should be between 23-30. Your Body mass index is 19.04 kg/m. If this is out of the aforementioned range listed, please consider follow up with your Primary Care Provider.  If you are age 23 or younger, your body mass index should be between 19-25. Your Body mass index is 19.04 kg/m. If this is out of the aformentioned range listed, please consider follow up with your Primary Care Provider.   Your provider has requested that you go to the basement level for lab work before leaving today. Press "B" on the elevator. The lab is located at the first door on the left as you exit the elevator.  You will be contacted by our office prior to your procedure for directions on holding your Xarelto.  If you do not hear from our office 1 week prior to your scheduled procedure, please call [phone] to discuss.   Start Benefiber 1 teaspoon daily, increase to 1 tablespoon daily if tolerated to bulk up stools.  Follow up pending the results of your Colonoscopy.

## 2020-01-09 NOTE — Telephone Encounter (Signed)
[facility] Pre-operative Risk Assessment      Request for surgical clearance:     Endoscopy Procedure  What type of surgery is being performed?     Colonoscopy  When is this surgery scheduled?     02/29/20  What type of clearance is required ?   Pharmacy  Are there any medications that need to be held prior to surgery and how long? Xarelto 3-5 days prior  Practice name and name of physician performing surgery?      [facility]  What is your office phone and fax number?      Phone- [phone]  [phone]  Anesthesia type (None, local, MAC, general) ?       MAC

## 2020-01-10 NOTE — Progress Notes (Signed)
I think colonoscopy is probably best next step.  Thanks

## 2020-01-11 ENCOUNTER — Encounter: Payer: Self-pay | Admitting: Pulmonary Disease

## 2020-01-11 ENCOUNTER — Other Ambulatory Visit: Payer: Self-pay

## 2020-01-11 ENCOUNTER — Ambulatory Visit (INDEPENDENT_AMBULATORY_CARE_PROVIDER_SITE_OTHER): Payer: Medicare Other | Admitting: Pulmonary Disease

## 2020-01-11 VITALS — BP 130/80 | HR 79 | Temp 97.1°F | Ht 59.0 in | Wt 96.0 lb

## 2020-01-11 DIAGNOSIS — J479 Bronchiectasis, uncomplicated: Secondary | ICD-10-CM | POA: Diagnosis not present

## 2020-01-11 NOTE — Patient Instructions (Signed)
Bronchiectasis, well-controlled --CONTINUE Breo Ellipta 200-63mcg. Take 1 puff daily --CONTINUE Albuterol inhaler 2 puffs every 4 hours as needed for shortness of breath or wheezing  Follow-up in 6 months

## 2020-01-11 NOTE — Progress Notes (Signed)
Synopsis: Referred in 01/2018 for progressive dyspnea x 6 weeks.  PFTs with reversible obstructive defect and started on ICS/LABA.  CT imaging consistent with mild bronchiectasis.  Subjective:   PATIENT ID: Gwendolyn Williams GENDER: female DOB: 07-16-1940, MRN: [id number]   HPI  Chief Complaint  Patient presents with  . Follow-up    no concerns   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   Starting one month ago, she wakes up in the morning with productive cough with chest congestion. Otherwise no issues. Denies shortness of breath. Compliant with her Breo. Very rarely uses albuterol. Regularly active at home.  Social History: Minimal smoking history. 6 pack years. Quit >50 years ago. Husband is a Nurse, learning disability with history of childhood asthma, nasal polyps and eczema.  I have personally reviewed patient's past medical/family/social history/allergies/current medications.  Past Medical History:  Diagnosis Date  . Arthritis   . Asthma    controlled with singulair  . Breast cancer ([place])    stage I left breast  . Current use of long term anticoagulation 04/21/2017  . Dyspnea, unspecified 02/24/2017   Started coincident with Hydrea, exertional and orthostatic, normal exam. O2 sat 100%.  . Family history of breast cancer   . GERD (gastroesophageal reflux disease)   . H/O blood clots    clotting disorder since 2010  . Hematoma of arm 08/09/2012  . Hypercoagulable state, primary ([place])    JAK2 gene defect, thrombocythemia  . Hyperlipidemia   . Hypertension   . 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 Pneumonia    viral pneumonia as a kid  . PONV (postoperative nausea and vomiting)        Family History  Problem Relation Age of Onset  . Breast cancer Mother 97  . Lung cancer Mother   . Heart disease Father   . Basal cell carcinoma Father        x2  . CAD Father   . Ovarian cysts Sister   . Dementia Sister   . Breast cancer Maternal Aunt 93  . Breast cancer Paternal Aunt 10  . Ovarian cysts Maternal Grandmother        possibly had ovarian cancer as well  . Breast cancer Maternal Aunt 63  . Cancer Other        either pancreatic or colon  . Other Daughter 69       breast lumpectomy- complex sclerosing lesion  . Polycystic ovary syndrome Daughter   . Bladder Cancer Cousin      Social History   Occupational History  . Not on file  Tobacco Use  . Smoking status: Former Smoker    Packs/day: 1.00    Years: 5.00    Pack years: 5.00    Start date: 8    Quit date: 1964    Years since quitting: 3.8  . Smokeless tobacco: Never Used  Vaping Use  . Vaping Use: Never used  Substance and Sexual Activity  . Alcohol use: Yes    Alcohol/week: 0.0 standard drinks    Comment: Rare  . Drug use: No  . Sexual activity: Not Currently    Partners: Male    Comment: 1st intercourse 79 yo-Fewer than 5 partners    Allergies  Allergen  Reactions  . Lipitor [Atorvastatin Calcium]     Weakness in legs   . Penicillins Hives    Has patient had a PCN reaction causing immediate rash, facial/tongue/throat swelling, SOB or lightheadedness with hypotension: Unknown Has patient had a PCN reaction causing severe rash involving mucus membranes or skin necrosis: Unknown Has patient had a PCN reaction that required hospitalization: No Has patient had a PCN reaction occurring within the last 10 years: No If all of the above answers are "NO", then may proceed with Cephalosporin use.   . Sulfa Antibiotics Other (See Comments)    unknown  . Tamiflu [Oseltamivir Phosphate] Nausea And Vomiting     Outpatient Medications Prior to Visit  Medication Sig Dispense Refill  .  albuterol (VENTOLIN HFA) 108 (90 Base) MCG/ACT inhaler Inhale 2 puffs into the lungs every 6 (six) hours as needed for wheezing or shortness of breath. 18 g 3  . amLODipine (NORVASC) 2.5 MG tablet Take 1 tablet (2.5 mg total) by mouth daily. TO BE TAKEN WITH THE 5 MG TABLET TO EQUAL 7.5 MG DAILY 90 tablet 3  . aspirin 81 MG tablet Take 81 mg by mouth daily.      Marland Kitchen BREO ELLIPTA 200-25 MCG/INH AEPB USE 1 INHALATION ORALLY    DAILY 60 each 5  . Cholecalciferol (VITAMIN D) 2000 units CAPS Take 4,000 Units by mouth daily.     Marland Kitchen docusate sodium (COLACE) 100 MG capsule Take 1 capsule (100 mg total) by mouth 2 (two) times daily. (Patient taking differently: Take 100 mg by mouth daily. ) 10 capsule 0  . folic acid (FOLVITE) 1 MG tablet Take 1 mg by mouth daily.    . hydroxyurea (HYDREA) 500 MG capsule TAKE ONE CAPSULE BY MOUTH DAILY WITH FOOD TO MINIMIZE GI SIDE EFFECTS 90 capsule 0  . hyoscyamine (LEVSIN SL) 0.125 MG SL tablet Place 0.125 mg under the tongue as needed.    . ondansetron (ZOFRAN) 4 MG tablet Take 1 tablet (4 mg total) by mouth every 6 (six) hours as needed for nausea. 30 tablet 0  . PEG-KCl-NaCl-NaSulf-Na Asc-C (PLENVU) 140 g SOLR Take 1 kit by mouth as directed. 1 each 0  . polyethylene glycol (MIRALAX / GLYCOLAX) packet Take 17 g by mouth 2 (two) times daily. (Patient taking differently: Take 17 g by mouth daily. ) 14 each 0  . rosuvastatin (CRESTOR) 40 MG tablet Take 1 tablet (40 mg total) by mouth daily. 90 tablet 3  . Spacer/Aero-Holding Chambers DEVI 1 Device by Does not apply route as directed. 1 each 0  . triamterene-hydrochlorothiazide (DYAZIDE) 37.5-25 MG capsule Take 1 capsule by mouth daily.    Alveda Reasons 20 MG TABS tablet TAKE 1 TABLET DAILY WITH   SUPPER 90 tablet 1  . amLODipine (NORVASC) 5 MG tablet Take 5 mg by mouth daily.     No facility-administered medications prior to visit.   Review of Systems  Constitutional: Negative for chills, diaphoresis, fever, malaise/fatigue  and weight loss.  HENT: Negative for congestion.   Respiratory: Negative for cough, hemoptysis, sputum production, shortness of breath and wheezing.   Cardiovascular: Negative for chest pain, palpitations and leg swelling.   Objective:    Vitals:   01/11/20 1241  BP: 130/80  Pulse: 79  Temp: (!) 97.1 F (36.2 C)  SpO2: 96%  Weight: 96 lb (43.5 kg)  Height: $Remove'4\' 11"'gYvPqEb$  (1.499 m)      Physical Exam: General: Well-appearing, no acute distress HENT: [place], AT, OP clear,  MMM Eyes: EOMI, no scleral icterus Respiratory: Clear to auscultation bilaterally.  No crackles, wheezing or rales Cardiovascular: RRR,  stage II/VI systolic heart murmur, no JVD Extremities:-Edema,-tenderness Neuro: AAO x4, CNII-XII grossly intact Skin: Intact, no rashes or bruising Psych: Normal mood, normal affect  Chest imaging: CXR 11/23/2017 No acute cardiopulmonary processes.  No infiltrate, edema or effusion.  CT chest without contrast 02/12/2018 Diffuse bronchiectasis present.  Mild hyperinflation of the lungs.  PFT: 01/19/2018-normal spirometry lung volumes and DLCO.  Significant bronchodilator effect in FVC.  12/19/19  FVC 2.57 (119%) FEV1 2.27 (142%) Ratio 93  TLC 104% DLCO 106% Interpretation: Normal PFTs  Imaging, labs and test noted above have been reviewed independently by me.  Assessment & Plan:   Discussion: 79 year old female who presents for follow-up for bronchiectasis. PFT reviewed compared. Stable symptoms. Not in active exacerbation  #Bronchiectasis, well-controlled --CONTINUE Breo Ellipta 200-50mcg. Take 1 puff daily --CONTINUE Albuterol inhaler 2 puffs every 4 hours as needed for shortness of breath or wheezing  Follow-up in 6 months   Immunization History  Administered Date(s) Administered  . Fluad Quad(high Dose 65+) 01/04/2020  . Influenza Inj Mdck Quad Pf 03/12/2018  . Influenza, High Dose Seasonal PF 12/29/2016, 01/17/2019  . PFIZER SARS-COV-2 Vaccination 04/12/2019,  04/30/2019, 11/17/2019   No orders of the defined types were placed in this encounter.  No orders of the defined types were placed in this encounter.  Return in about 6 months (around 07/11/2020).  Adiel Erney Rodman Pickle, MD [facility] 01/11/2020 12:40 PM

## 2020-01-12 NOTE — Progress Notes (Signed)
Patient was notified, Dr. Ardis Hughs agreed with plan for a colonoscopy.

## 2020-01-16 ENCOUNTER — Telehealth: Payer: Self-pay | Admitting: *Deleted

## 2020-01-16 NOTE — Telephone Encounter (Signed)
Patient called - Mohs procedure scheduled for 11/4. How should she manage Zarelto?  Dr. Irene Limbo informed. Contacted patient with Dr. Grier Mitts response: Platelets have been WNL. OK to hold Xarelto for 48h prior to procedure and restart as soon as hemostasis achieved after procedure typically 12-24h post procedure. Patient verbalized understanding of directions

## 2020-01-23 ENCOUNTER — Telehealth: Payer: Self-pay | Admitting: Pulmonary Disease

## 2020-01-23 MED ORDER — BREO ELLIPTA 200-25 MCG/INH IN AEPB: INHALATION_SPRAY | RESPIRATORY_TRACT | 3 refills | Status: DC

## 2020-01-23 NOTE — Telephone Encounter (Signed)
Called and spoke with [name] who states patient needs refill of Breo. He verified pharmacy. RX sent for 3 month supply. Nothing further needed at this time.

## 2020-01-27 DIAGNOSIS — M810 Age-related osteoporosis without current pathological fracture: Secondary | ICD-10-CM | POA: Diagnosis not present

## 2020-01-27 DIAGNOSIS — I2510 Atherosclerotic heart disease of native coronary artery without angina pectoris: Secondary | ICD-10-CM | POA: Diagnosis not present

## 2020-01-27 DIAGNOSIS — J471 Bronchiectasis with (acute) exacerbation: Secondary | ICD-10-CM | POA: Diagnosis not present

## 2020-01-27 DIAGNOSIS — I129 Hypertensive chronic kidney disease with stage 1 through stage 4 chronic kidney disease, or unspecified chronic kidney disease: Secondary | ICD-10-CM | POA: Diagnosis not present

## 2020-01-27 DIAGNOSIS — N183 Chronic kidney disease, stage 3 unspecified: Secondary | ICD-10-CM | POA: Diagnosis not present

## 2020-01-27 DIAGNOSIS — Z853 Personal history of malignant neoplasm of breast: Secondary | ICD-10-CM | POA: Diagnosis not present

## 2020-01-27 DIAGNOSIS — J479 Bronchiectasis, uncomplicated: Secondary | ICD-10-CM | POA: Diagnosis not present

## 2020-01-27 DIAGNOSIS — M169 Osteoarthritis of hip, unspecified: Secondary | ICD-10-CM | POA: Diagnosis not present

## 2020-01-27 DIAGNOSIS — E7800 Pure hypercholesterolemia, unspecified: Secondary | ICD-10-CM | POA: Diagnosis not present

## 2020-02-02 DIAGNOSIS — I788 Other diseases of capillaries: Secondary | ICD-10-CM | POA: Diagnosis not present

## 2020-02-02 DIAGNOSIS — I789 Disease of capillaries, unspecified: Secondary | ICD-10-CM | POA: Diagnosis not present

## 2020-02-02 DIAGNOSIS — L570 Actinic keratosis: Secondary | ICD-10-CM | POA: Diagnosis not present

## 2020-02-02 DIAGNOSIS — C44319 Basal cell carcinoma of skin of other parts of face: Secondary | ICD-10-CM | POA: Diagnosis not present

## 2020-02-10 DIAGNOSIS — Z1231 Encounter for screening mammogram for malignant neoplasm of breast: Secondary | ICD-10-CM | POA: Diagnosis not present

## 2020-02-15 NOTE — Telephone Encounter (Signed)
Okay to hold Xarelto for 48h prior to procedure and restart at proceduralist's discretion based on hemostasis. [place]

## 2020-02-16 NOTE — Telephone Encounter (Signed)
Left voicemail to return phone call to discuss when to stop Xarelto

## 2020-02-16 NOTE — Telephone Encounter (Signed)
Patient has returned phone call and when to stop her Xarelto. She is aware that she will be notified after her procedure when to restart. She has expressed under standing.

## 2020-02-17 ENCOUNTER — Telehealth: Payer: Self-pay | Admitting: Hematology

## 2020-02-17 DIAGNOSIS — M25551 Pain in right hip: Secondary | ICD-10-CM | POA: Diagnosis not present

## 2020-02-17 NOTE — Telephone Encounter (Signed)
Rescheduled 11/26 appointment to 12/03 due to provider pal. Patient has been called and voicemail was left.

## 2020-02-20 DIAGNOSIS — M25551 Pain in right hip: Secondary | ICD-10-CM | POA: Diagnosis not present

## 2020-02-24 ENCOUNTER — Inpatient Hospital Stay: Payer: Medicare Other | Admitting: Hematology

## 2020-02-24 ENCOUNTER — Inpatient Hospital Stay: Payer: Medicare Other

## 2020-02-29 ENCOUNTER — Ambulatory Visit (AMBULATORY_SURGERY_CENTER): Payer: Medicare Other | Admitting: Gastroenterology

## 2020-02-29 ENCOUNTER — Other Ambulatory Visit: Payer: Self-pay

## 2020-02-29 ENCOUNTER — Encounter: Payer: Self-pay | Admitting: Gastroenterology

## 2020-02-29 VITALS — BP 132/66 | HR 62 | Temp 97.3°F | Resp 14 | Ht 59.0 in | Wt 94.0 lb

## 2020-02-29 DIAGNOSIS — I2510 Atherosclerotic heart disease of native coronary artery without angina pectoris: Secondary | ICD-10-CM | POA: Diagnosis not present

## 2020-02-29 DIAGNOSIS — K6289 Other specified diseases of anus and rectum: Secondary | ICD-10-CM | POA: Diagnosis not present

## 2020-02-29 DIAGNOSIS — D122 Benign neoplasm of ascending colon: Secondary | ICD-10-CM

## 2020-02-29 DIAGNOSIS — Z853 Personal history of malignant neoplasm of breast: Secondary | ICD-10-CM | POA: Diagnosis not present

## 2020-02-29 DIAGNOSIS — J45909 Unspecified asthma, uncomplicated: Secondary | ICD-10-CM | POA: Diagnosis not present

## 2020-02-29 DIAGNOSIS — K5730 Diverticulosis of large intestine without perforation or abscess without bleeding: Secondary | ICD-10-CM

## 2020-02-29 DIAGNOSIS — K648 Other hemorrhoids: Secondary | ICD-10-CM | POA: Diagnosis not present

## 2020-02-29 DIAGNOSIS — R194 Change in bowel habit: Secondary | ICD-10-CM

## 2020-02-29 MED ORDER — SODIUM CHLORIDE 0.9 % IV SOLN: 500.0000 mL | Freq: Once | INTRAVENOUS | Status: DC

## 2020-02-29 NOTE — Op Note (Signed)
[place] Patient Name: Gwendolyn Williams Procedure Date: 02/29/2020 8:22 AM MRN: [id number] Endoscopist: Milus Banister , MD Age: 79 Referring MD:  Date of Birth: 1940-04-25 Gender: Female Account #: [id number] Procedure:                Colonoscopy Indications:              Change in bowel habits after perirectal, perianal                            shingles several months ago; remote segmental                            colectomy for severe ischemic colitis Medicines:                Monitored Anesthesia Care Procedure:                Pre-Anesthesia Assessment:                           - Prior to the procedure, a History and Physical                            was performed, and patient medications and                            allergies were reviewed. The patient's tolerance of                            previous anesthesia was also reviewed. The risks                            and benefits of the procedure and the sedation                            options and risks were discussed with the patient.                            All questions were answered, and informed consent                            was obtained. Prior Anticoagulants: The patient has                            taken Xarelto (rivaroxaban), last dose was 3 days                            prior to procedure. ASA Grade Assessment: III - A                            patient with severe systemic disease. After                            reviewing the risks and benefits, the patient was  deemed in satisfactory condition to undergo the                            procedure.                           After obtaining informed consent, the colonoscope                            was passed under direct vision. Throughout the                            procedure, the patient's blood pressure, pulse, and                            oxygen saturations were monitored continuously. The                             Colonoscope was introduced through the anus and                            advanced to the the cecum, identified by                            appendiceal orifice and ileocecal valve. The                            colonoscopy was performed without difficulty. The                            patient tolerated the procedure well. The quality                            of the bowel preparation was good. The ileocecal                            valve, appendiceal orifice, and rectum were                            photographed. Scope In: 8:30:05 AM Scope Out: 9:37:16 AM Scope Withdrawal Time: 0 hours 11 minutes 10 seconds  Total Procedure Duration: 0 hours 16 minutes 42 seconds  Findings:                 A 2 mm polyp was found in the ascending colon. The                            polyp was sessile. The polyp was removed with a                            cold snare. Resection and retrieval were complete.                           There was a slightly firm 6-3mm nodule at the  otherwise normal sigmoid region colo-colonic                            anastomosis. This was not overtly neoplastic. I                            biopsied it with forceps and then labeled the site                            with submucosal injection of Spot (2cc).                           Multiple small and large-mouthed diverticula were                            found in the entire colon.                           Small external hemorrhoids.                           The exam was otherwise without abnormality on                            direct and retroflexion views. Complications:            No immediate complications. Estimated blood loss:                            None. Estimated Blood Loss:     Estimated blood loss: none. Impression:               - One 2 mm polyp in the ascending colon, removed                            with a cold snare. Resected and retrieved.                            - There was a slightly firm 6-43mm nodule at the                            otherwise normal sigmoid region colo-colonic                            anastomosis. This was not overtly neoplastic. I                            biopsied it with forceps and then labeled the site                            with submucosal injection of Spot (2cc).                           - Diverticulosis in the entire examined colon.                           -  Small external hemorrhoids.                           - The examination was otherwise normal on direct                            and retroflexion views. Recommendation:           - Patient has a contact number available for                            emergencies. The signs and symptoms of potential                            delayed complications were discussed with the                            patient. Return to normal activities tomorrow.                            Written discharge instructions were provided to the                            patient.                           - Resume previous diet.                           - Continue present medications. You can resume your                            blood thinner later today.                           - Await pathology results. Milus Banister, MD 02/29/2020 9:27:40 AM This report has been signed electronically.

## 2020-02-29 NOTE — Progress Notes (Signed)
VS by CW  No changes to medical or social hx since previsit.  

## 2020-02-29 NOTE — Patient Instructions (Signed)
Handout provided on polyps, hemorrhoids and diverticulosis.   Resume your Xarelto later this evening.   YOU HAD AN ENDOSCOPIC PROCEDURE TODAY AT [facility]:   Refer to the procedure report that was given to you for any specific questions about what was found during the examination.  If the procedure report does not answer your questions, please call your gastroenterologist to clarify.  If you requested that your care partner not be given the details of your procedure findings, then the procedure report has been included in a sealed envelope for you to review at your convenience later.  YOU SHOULD EXPECT: Some feelings of bloating in the abdomen. Passage of more gas than usual.  Walking can help get rid of the air that was put into your GI tract during the procedure and reduce the bloating. If you had a lower endoscopy (such as a colonoscopy or flexible sigmoidoscopy) you may notice spotting of blood in your stool or on the toilet paper. If you underwent a bowel prep for your procedure, you may not have a normal bowel movement for a few days.  Please Note:  You might notice some irritation and congestion in your nose or some drainage.  This is from the oxygen used during your procedure.  There is no need for concern and it should clear up in a day or so.  SYMPTOMS TO REPORT IMMEDIATELY:   Following lower endoscopy (colonoscopy or flexible sigmoidoscopy):  Excessive amounts of blood in the stool  Significant tenderness or worsening of abdominal pains  Swelling of the abdomen that is new, acute  Fever of 100F or higher  For urgent or emergent issues, a gastroenterologist can be reached at any hour by calling [phone]. Do not use MyChart messaging for urgent concerns.    DIET:  We do recommend a small meal at first, but then you may proceed to your regular diet.  Drink plenty of fluids but you should avoid alcoholic beverages for 24 hours.  ACTIVITY:  You should plan to  take it easy for the rest of today and you should NOT DRIVE or use heavy machinery until tomorrow (because of the sedation medicines used during the test).    FOLLOW UP: Our staff will call the number listed on your records 48-72 hours following your procedure to check on you and address any questions or concerns that you may have regarding the information given to you following your procedure. If we do not reach you, we will leave a message.  We will attempt to reach you two times.  During this call, we will ask if you have developed any symptoms of COVID 19. If you develop any symptoms (ie: fever, flu-like symptoms, shortness of breath, cough etc.) before then, please call [phone].  If you test positive for Covid 19 in the 2 weeks post procedure, please call and report this information to [place].    If any biopsies were taken you will be contacted by phone or by letter within the next 1-3 weeks.  Please call us at [phone] if you have not heard about the biopsies in 3 weeks.    SIGNATURES/CONFIDENTIALITY: You and/or your care partner have signed paperwork which will be entered into your electronic medical record.  These signatures attest to the fact that that the information above on your After Visit Summary has been reviewed and is understood.  Full responsibility of the confidentiality of this discharge information lies with you and/or your care-partner.

## 2020-02-29 NOTE — Progress Notes (Signed)
Called to room to assist during endoscopic procedure.  Patient ID and intended procedure confirmed with present staff. Received instructions for my participation in the procedure from the performing physician.  

## 2020-02-29 NOTE — Progress Notes (Signed)
Report given to PACU, vss 

## 2020-03-01 ENCOUNTER — Telehealth: Payer: Self-pay | Admitting: *Deleted

## 2020-03-01 NOTE — Telephone Encounter (Signed)
Patient had colonoscopy yesterday - developed headache due to propofol for anesthesia. Wants to cancel appts on 12/3 and reschedule for next week or week after.   Appts for 12/3 cancelled and schedule message sent.

## 2020-03-02 ENCOUNTER — Telehealth: Payer: Self-pay | Admitting: *Deleted

## 2020-03-02 ENCOUNTER — Telehealth: Payer: Self-pay

## 2020-03-02 ENCOUNTER — Inpatient Hospital Stay: Payer: Medicare Other | Admitting: Hematology

## 2020-03-02 ENCOUNTER — Inpatient Hospital Stay: Payer: Medicare Other

## 2020-03-02 DIAGNOSIS — E7800 Pure hypercholesterolemia, unspecified: Secondary | ICD-10-CM | POA: Diagnosis not present

## 2020-03-02 DIAGNOSIS — Z853 Personal history of malignant neoplasm of breast: Secondary | ICD-10-CM | POA: Diagnosis not present

## 2020-03-02 DIAGNOSIS — J479 Bronchiectasis, uncomplicated: Secondary | ICD-10-CM | POA: Diagnosis not present

## 2020-03-02 DIAGNOSIS — M810 Age-related osteoporosis without current pathological fracture: Secondary | ICD-10-CM | POA: Diagnosis not present

## 2020-03-02 DIAGNOSIS — I2510 Atherosclerotic heart disease of native coronary artery without angina pectoris: Secondary | ICD-10-CM | POA: Diagnosis not present

## 2020-03-02 DIAGNOSIS — N183 Chronic kidney disease, stage 3 unspecified: Secondary | ICD-10-CM | POA: Diagnosis not present

## 2020-03-02 DIAGNOSIS — K219 Gastro-esophageal reflux disease without esophagitis: Secondary | ICD-10-CM | POA: Diagnosis not present

## 2020-03-02 DIAGNOSIS — M169 Osteoarthritis of hip, unspecified: Secondary | ICD-10-CM | POA: Diagnosis not present

## 2020-03-02 DIAGNOSIS — I129 Hypertensive chronic kidney disease with stage 1 through stage 4 chronic kidney disease, or unspecified chronic kidney disease: Secondary | ICD-10-CM | POA: Diagnosis not present

## 2020-03-02 DIAGNOSIS — J471 Bronchiectasis with (acute) exacerbation: Secondary | ICD-10-CM | POA: Diagnosis not present

## 2020-03-02 NOTE — Telephone Encounter (Signed)
  Follow up Call-  Call back number 02/29/2020  Post procedure Call Back phone  # [phone]  Permission to leave phone message Yes  Some recent data might be hidden      1st follow up call made.  NALM

## 2020-03-02 NOTE — Telephone Encounter (Signed)
  Follow up Call-  Call back number 02/29/2020  Post procedure Call Back phone  # [phone]  Permission to leave phone message Yes  Some recent data might be hidden     Patient questions:  Do you have a fever, pain , or abdominal swelling? No. Pain Score  0 *  Have you tolerated food without any problems? Yes.    Have you been able to return to your normal activities? Yes.    Do you have any questions about your discharge instructions: Diet   No. Medications  No. Follow up visit  No.  Do you have questions or concerns about your Care? Yes.    Actions: * If pain score is 4 or above: No action needed, pain <4.  1. Have you developed a fever since your procedure? no  2.   Have you had an respiratory symptoms (SOB or cough) since your procedure? no  3.   Have you tested positive for COVID 19 since your procedure no  4.   Have you had any family members/close contacts diagnosed with the COVID 19 since your procedure?  no   If yes to any of these questions please route to Joylene John, RN and Joella Prince, RN

## 2020-03-05 DIAGNOSIS — E7800 Pure hypercholesterolemia, unspecified: Secondary | ICD-10-CM | POA: Diagnosis not present

## 2020-03-05 DIAGNOSIS — I10 Essential (primary) hypertension: Secondary | ICD-10-CM | POA: Diagnosis not present

## 2020-03-05 DIAGNOSIS — Z5181 Encounter for therapeutic drug level monitoring: Secondary | ICD-10-CM | POA: Diagnosis not present

## 2020-03-06 LAB — COMPREHENSIVE METABOLIC PANEL
ALT: 20 IU/L (ref 0–32)
AST: 23 IU/L (ref 0–40)
Albumin/Globulin Ratio: 2.1 (ref 1.2–2.2)
Albumin: 4.2 g/dL (ref 3.7–4.7)
Alkaline Phosphatase: 70 IU/L (ref 44–121)
BUN/Creatinine Ratio: 18 (ref 12–28)
BUN: 17 mg/dL (ref 8–27)
Bilirubin Total: 0.3 mg/dL (ref 0.0–1.2)
CO2: 22 mmol/L (ref 20–29)
Calcium: 9.6 mg/dL (ref 8.7–10.3)
Chloride: 104 mmol/L (ref 96–106)
Creatinine, Ser: 0.94 mg/dL (ref 0.57–1.00)
GFR calc Af Amer: 68 mL/min/{1.73_m2} (ref >59)
GFR calc non Af Amer: 59 mL/min/{1.73_m2} — ABNORMAL LOW (ref >59)
Globulin, Total: 2 g/dL (ref 1.5–4.5)
Glucose: 81 mg/dL (ref 65–99)
Potassium: 3.6 mmol/L (ref 3.5–5.2)
Sodium: 142 mmol/L (ref 134–144)
Total Protein: 6.2 g/dL (ref 6.0–8.5)

## 2020-03-06 LAB — LIPID PANEL
Chol/HDL Ratio: 2.8 ratio (ref 0.0–4.4)
Cholesterol, Total: 140 mg/dL (ref 100–199)
HDL: 50 mg/dL (ref >39)
LDL Chol Calc (NIH): 61 mg/dL (ref 0–99)
Triglycerides: 174 mg/dL — ABNORMAL HIGH (ref 0–149)
VLDL Cholesterol Cal: 29 mg/dL (ref 5–40)

## 2020-03-07 ENCOUNTER — Inpatient Hospital Stay: Payer: Medicare Other | Attending: Hematology

## 2020-03-07 ENCOUNTER — Other Ambulatory Visit: Payer: Self-pay

## 2020-03-07 ENCOUNTER — Telehealth: Payer: Self-pay | Admitting: Gastroenterology

## 2020-03-07 ENCOUNTER — Encounter: Payer: Self-pay | Admitting: Gastroenterology

## 2020-03-07 ENCOUNTER — Inpatient Hospital Stay (HOSPITAL_BASED_OUTPATIENT_CLINIC_OR_DEPARTMENT_OTHER): Payer: Medicare Other | Admitting: Hematology

## 2020-03-07 VITALS — BP 131/79 | HR 80 | Temp 97.8°F | Resp 15 | Ht 59.0 in | Wt 96.2 lb

## 2020-03-07 DIAGNOSIS — Z801 Family history of malignant neoplasm of trachea, bronchus and lung: Secondary | ICD-10-CM | POA: Insufficient documentation

## 2020-03-07 DIAGNOSIS — D473 Essential (hemorrhagic) thrombocythemia: Secondary | ICD-10-CM

## 2020-03-07 DIAGNOSIS — Z803 Family history of malignant neoplasm of breast: Secondary | ICD-10-CM | POA: Insufficient documentation

## 2020-03-07 DIAGNOSIS — Z8489 Family history of other specified conditions: Secondary | ICD-10-CM | POA: Diagnosis not present

## 2020-03-07 DIAGNOSIS — K55069 Acute infarction of intestine, part and extent unspecified: Secondary | ICD-10-CM

## 2020-03-07 DIAGNOSIS — Z853 Personal history of malignant neoplasm of breast: Secondary | ICD-10-CM | POA: Insufficient documentation

## 2020-03-07 DIAGNOSIS — Z87891 Personal history of nicotine dependence: Secondary | ICD-10-CM | POA: Diagnosis not present

## 2020-03-07 DIAGNOSIS — Z8052 Family history of malignant neoplasm of bladder: Secondary | ICD-10-CM | POA: Diagnosis not present

## 2020-03-07 DIAGNOSIS — Z79899 Other long term (current) drug therapy: Secondary | ICD-10-CM | POA: Diagnosis not present

## 2020-03-07 DIAGNOSIS — I10 Essential (primary) hypertension: Secondary | ICD-10-CM | POA: Diagnosis not present

## 2020-03-07 DIAGNOSIS — M8580 Other specified disorders of bone density and structure, unspecified site: Secondary | ICD-10-CM | POA: Diagnosis not present

## 2020-03-07 DIAGNOSIS — Z8249 Family history of ischemic heart disease and other diseases of the circulatory system: Secondary | ICD-10-CM | POA: Insufficient documentation

## 2020-03-07 LAB — CBC WITH DIFFERENTIAL/PLATELET
Abs Immature Granulocytes: 0.11 10*3/uL — ABNORMAL HIGH (ref 0.00–0.07)
Basophils Absolute: 0.1 10*3/uL (ref 0.0–0.1)
Basophils Relative: 1 %
Eosinophils Absolute: 0.2 10*3/uL (ref 0.0–0.5)
Eosinophils Relative: 2 %
HCT: 41 % (ref 36.0–46.0)
Hemoglobin: 12.7 g/dL (ref 12.0–15.0)
Immature Granulocytes: 1 %
Lymphocytes Relative: 12 %
Lymphs Abs: 1 10*3/uL (ref 0.7–4.0)
MCH: 27.1 pg (ref 26.0–34.0)
MCHC: 31 g/dL (ref 30.0–36.0)
MCV: 87.6 fL (ref 80.0–100.0)
Monocytes Absolute: 0.3 10*3/uL (ref 0.1–1.0)
Monocytes Relative: 3 %
Neutro Abs: 7.3 10*3/uL (ref 1.7–7.7)
Neutrophils Relative %: 81 %
Platelets: 426 10*3/uL — ABNORMAL HIGH (ref 150–400)
RBC: 4.68 MIL/uL (ref 3.87–5.11)
RDW: 17.8 % — ABNORMAL HIGH (ref 11.5–15.5)
WBC: 8.9 10*3/uL (ref 4.0–10.5)
nRBC: 0 % (ref 0.0–0.2)

## 2020-03-07 LAB — CMP (CANCER CENTER ONLY)
ALT: 25 U/L (ref 0–44)
AST: 29 U/L (ref 15–41)
Albumin: 3.8 g/dL (ref 3.5–5.0)
Alkaline Phosphatase: 69 U/L (ref 38–126)
Anion gap: 12 (ref 5–15)
BUN: 25 mg/dL — ABNORMAL HIGH (ref 8–23)
CO2: 27 mmol/L (ref 22–32)
Calcium: 10 mg/dL (ref 8.9–10.3)
Chloride: 103 mmol/L (ref 98–111)
Creatinine: 1 mg/dL (ref 0.44–1.00)
GFR, Estimated: 58 mL/min — ABNORMAL LOW (ref >60)
Glucose, Bld: 78 mg/dL (ref 70–99)
Potassium: 3.4 mmol/L — ABNORMAL LOW (ref 3.5–5.1)
Sodium: 142 mmol/L (ref 135–145)
Total Bilirubin: 0.5 mg/dL (ref 0.3–1.2)
Total Protein: 7 g/dL (ref 6.5–8.1)

## 2020-03-07 MED ORDER — RIVAROXABAN 15 MG PO TABS: 15.0000 mg | ORAL_TABLET | Freq: Every day | ORAL | 3 refills | Status: DC

## 2020-03-07 NOTE — Telephone Encounter (Signed)
Pt states that she has been dealing with constipation since her last colonoscopy. She began taking full dose of miralax the day after her procedure but still no bm. She would like some advise on what else she can do.

## 2020-03-07 NOTE — Progress Notes (Signed)
HEMATOLOGY/ONCOLOGY CLINIC NOTE  Date of Service: 03/07/2020  Patient Care Team: Gaynelle Arabian, MD as PCP - General (Family Medicine) Skeet Latch, MD as PCP - Cardiology (Cardiology) Annia Belt, MD as Consulting Physician (Oncology) Rolm Bookbinder, MD as Consulting Physician (General Surgery)  CHIEF COMPLAINTS/PURPOSE OF CONSULTATION:  Essential Thrombocytosis  HISTORY OF PRESENTING ILLNESS:   Gwendolyn Williams is a wonderful 79 y.o. female who has been referred to [place] by Dr. Gaynelle Arabian, and was previously under the care of Dr. 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 The pt reports that she is doing well overall.   The pt reports that she has seen Dr. Michail Sermon in GI and is concerned for esophageal spasm but does not wish to proceed with the diagnostic work up with esophageal manometry. She notes that muscle relaxants help with this occasional pain. She also follows up with Dr. Donne Hazel in surgery to determine if this related to her previous abdominal surgeries. 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  The pt notes that she does not have any other present concerns. She notes that she had SOB at the end of last year, as part of bronchiectasis, and takes Memory Dance once a day, denies needing her rescue inhaler. She denies positional elements to her SOB.  The pt notes that she was off Hyxdroxyurea during the Fall of 2019 during this work up. She then returned to $RemoveBef'500mg'onzdPWjXBJ$  Hydoxyurea once a day and her PLT have normalized. She takes this in the morning with her food and denies any problems tolerating this medication.   The pt denies any concerns for bleeding at this time.  The pt continues with annual mammograms with her OBGYN Dr. Donalynn Furlong. The pt pursued maintenance Arimidex for 8.5 years, and stopped during the  evaluation of her respiratory symptoms in Fall 2019. The pt completes a bone density study every year, and notes she has stable osteopenia. She takes Vitamin D replacement and notes her levels are good.  She continues on $RemoveBefo'20mg'UUNOeGVYakd$  Xarelto. She continues on $RemoveBefo'81mg'yWemtbhJuEr$  aspirin as well.  The pt notes that she has had chronic mouth ulcers for "decades." She has tried Vitamin B complex and mouthwashes. She notes that she routinely gets mouth ulcers after her dental cleanings as well.  Most recent lab results (06/22/18) of CBC w/diff and CMP is as follows: all values are WNL except for RDW at 25.2, Potassium at 3.3, BUN at 30, Creatinine at 1.23, GFR at 43.  On review of systems, pt reports intermittent esophageal discomfort, stable breathing, good energy levels, some abdominal tenderness, chronic mouth ulcers, and denies concerns for bleeding, and any other symptoms.  Interval History:  Gwendolyn Williams returns today for management and evaluation of her Essential Thrombocytosis. The patient's last visit with [place] was on 10/21/2019. The pt reports that she is doing well overall.  The pt reports that she had her Mohs surgery in the interim and had a follow up with the surgeon last Friday. Pt recently had a Colonoscopy. She notes a large bruise on her lower left leg. She denies any injury to the area. Pt also has smaller bruises on her forearms. Pt has received her flu and COVID19 vaccines.   Lab results today (03/07/20) of CBC w/diff and CMP is as follows: all values are WNL except for RDW at 17.8, PLT at 426K, Abs  Immature Granulocytes at 0.11K, Potassium at 3.4, BUN at 25, GFR at 58.  On review of systems, pt reports bruising and denies any other symptoms.   MEDICAL HISTORY:  Past Medical History:  Diagnosis Date  . Arthritis   . Asthma    controlled with singulair  . Breast cancer ([place])    stage I left breast  . Current use of long term anticoagulation 04/21/2017  . Dyspnea, unspecified 02/24/2017   Started  coincident with Hydrea, exertional and orthostatic, normal exam. O2 sat 100%.  . Family history of breast cancer   . GERD (gastroesophageal reflux disease)   . H/O blood clots    clotting disorder since 2010  . Hematoma of arm 08/09/2012  . 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 Hyperlipidemia   . Hypertension   . 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 Pneumonia    viral pneumonia as a kid  . PONV (postoperative nausea and vomiting)     SURGICAL HISTORY: Past Surgical History:  Procedure Laterality Date  . BREAST SURGERY     Left breast lumpectomy sentinel node biopsy  . CERVICAL SPINE SURGERY  05/23/2008  . COLECTOMY  04/11/2008   left colectomy with end colostomy due to clot  . COLOSTOMY TAKEDOWN  08/14/2008  . EYE SURGERY     bil cataracts  . HERNIA REPAIR  2011   [facility]  . INTRAVASCULAR PRESSURE WIRE/FFR STUDY N/A 01/25/2018   Procedure: INTRAVASCULAR PRESSURE WIRE/FFR STUDY;  Surgeon: Nelva Bush, MD;  Location: [facility];  Service: Cardiovascular;  Laterality: N/A;  . LEFT HEART CATH AND CORONARY ANGIOGRAPHY N/A 01/25/2018   Procedure: LEFT HEART CATH AND CORONARY ANGIOGRAPHY;  Surgeon: Nelva Bush, MD;  Location: [facility];  Service: Cardiovascular;  Laterality: N/A;  . LUMBAR [place] SURGERY  2006  . TOTAL HIP ARTHROPLASTY Left 10/07/2016   Procedure: LEFT TOTAL HIP ARTHROPLASTY ANTERIOR APPROACH;  Surgeon: Paralee Cancel, MD;  Location: [facility];  Service: Orthopedics;  Laterality: Left;  70 mins    SOCIAL HISTORY: Social History   Socioeconomic History  . Marital status: Married    Spouse name: Not on file  . Number of children: Not on file  . Years of education: Not on file  . Highest education level: Not on file  Occupational History  . Not on file  Tobacco Use  . Smoking status: Former Smoker    Packs/day: 1.00    Years: 5.00    Pack years: 5.00     Start date: 23    Quit date: 1964    Years since quitting: 57.9  . Smokeless tobacco: Never Used  Vaping Use  . Vaping Use: Never used  Substance and Sexual Activity  . Alcohol use: Yes    Alcohol/week: 0.0 standard drinks    Comment: Rare  . Drug use: No  . Sexual activity: Not Currently    Partners: Male    Comment: 1st intercourse 78 yo-Fewer than 5 partners  Other Topics Concern  . Not on file  Social History Narrative  . Not on file   Social Determinants of Health   Financial Resource Strain:   . Difficulty of Paying Living Expenses: Not on file  Food Insecurity:   . Worried About Charity fundraiser in the Last Year: Not on file  . Ran Out of Food in the Last Year: Not on file  Transportation Needs:   . Film/video editor (Medical): Not on file  . Lack of Transportation (Non-Medical): Not on file  Physical Activity:   . Days of Exercise per Week: Not on file  . Minutes of Exercise per Session: Not on file  Stress:   . Feeling of Stress : Not on file  Social Connections:   . Frequency of Communication with Friends and Family: Not on file  . Frequency of Social Gatherings with Friends and Family: Not on file  . Attends Religious Services: Not on file  . Active Member of Clubs or Organizations: Not on file  . Attends Archivist Meetings: Not on file  . Marital Status: Not on file  Intimate Partner Violence:   . Fear of Current or Ex-Partner: Not on file  . Emotionally Abused: Not on file  . Physically Abused: Not on file  . Sexually Abused: Not on file    FAMILY HISTORY: Family History  Problem Relation Age of Onset  . Breast cancer Mother 51  . Lung cancer Mother   . Heart disease Father   . Basal cell carcinoma Father        x2  . CAD Father   . Ovarian cysts Sister   . Dementia Sister   . Breast cancer Maternal Aunt 45  . Breast cancer Paternal Aunt 1  . Ovarian cysts Maternal Grandmother        possibly had ovarian cancer as well   . Breast cancer Maternal Aunt 58  . Cancer Other        either pancreatic or colon  . Other Daughter 23       breast lumpectomy- complex sclerosing lesion  . Polycystic ovary syndrome Daughter   . Bladder Cancer Cousin     ALLERGIES:  is allergic to lipitor [atorvastatin calcium], penicillins, sulfa antibiotics, and tamiflu [oseltamivir phosphate].  MEDICATIONS:  Current Outpatient Medications  Medication Sig Dispense Refill  . albuterol (VENTOLIN HFA) 108 (90 Base) MCG/ACT inhaler Inhale 2 puffs into the lungs every 6 (six) hours as needed for wheezing or shortness of breath. 18 g 3  . BAYER ASPIRIN EC LOW DOSE 81 MG EC tablet Take by mouth.    . Cholecalciferol (VITAMIN D) 2000 units CAPS Take 4,000 Units by mouth daily.     Marland Kitchen docusate sodium (COLACE) 100 MG capsule Take 1 capsule (100 mg total) by mouth 2 (two) times daily. (Patient taking differently: Take 100 mg by mouth daily. ) 10 capsule 0  . fluticasone furoate-vilanterol (BREO ELLIPTA) 200-25 MCG/INH AEPB USE 1 INHALATION DAILY 169 each 3  . folic acid (FOLVITE) 1 MG tablet Take 1 mg by mouth daily.    . hydroxyurea (HYDREA) 500 MG capsule TAKE ONE CAPSULE BY MOUTH DAILY WITH FOOD TO MINIMIZE GI SIDE EFFECTS 90 capsule 0  . hyoscyamine (LEVSIN SL) 0.125 MG SL tablet Place 0.125 mg under the tongue as needed.    . ondansetron (ZOFRAN) 4 MG tablet Take 1 tablet (4 mg total) by mouth every 6 (six) hours as needed for nausea. 30 tablet 0  . polyethylene glycol (MIRALAX / GLYCOLAX) packet Take 17 g by mouth 2 (two) times daily. (Patient taking differently: Take 17 g by mouth daily. ) 14 each 0  . rosuvastatin (CRESTOR) 40 MG tablet Take 1 tablet (40 mg total) by mouth daily. 90 tablet 3  . Spacer/Aero-Holding Chambers DEVI 1 Device by Does not apply route as directed. 1 each 0  . triamterene-hydrochlorothiazide (DYAZIDE)  37.5-25 MG capsule Take 1 capsule by mouth daily.    Marland Kitchen amLODipine (NORVASC) 2.5 MG tablet Take 1 tablet (2.5 mg  total) by mouth daily. TO BE TAKEN WITH THE 5 MG TABLET TO EQUAL 7.5 MG DAILY 90 tablet 3  . amLODipine (NORVASC) 5 MG tablet Take 5 mg by mouth daily.    . Rivaroxaban (XARELTO) 15 MG TABS tablet Take 1 tablet (15 mg total) by mouth daily with supper. 30 tablet 3   No current facility-administered medications for this visit.    REVIEW OF SYSTEMS:   A 10+ POINT REVIEW OF SYSTEMS WAS OBTAINED including neurology, dermatology, psychiatry, cardiac, respiratory, lymph, extremities, GI, GU, Musculoskeletal, constitutional, breasts, reproductive, HEENT.  All pertinent positives are noted in the HPI.  All others are negative.   PHYSICAL EXAMINATION: ECOG FS:2 - Symptomatic, <50% confined to bed  Vitals:   03/07/20 1120  BP: 131/79  Pulse: 80  Resp: 15  Temp: 97.8 F (36.6 C)  SpO2: 100%   Wt Readings from Last 3 Encounters:  03/07/20 96 lb 3.2 oz (43.6 kg)  02/29/20 94 lb (42.6 kg)  01/11/20 96 lb (43.5 kg)   Body mass index is 19.43 kg/m.    GENERAL:alert, in no acute distress and comfortable SKIN: no acute rashes, no significant lesions EYES: conjunctiva are pink and non-injected, sclera anicteric OROPHARYNX: MMM, no exudates, no oropharyngeal erythema or ulceration NECK: supple, no JVD LYMPH:  no palpable lymphadenopathy in the cervical, axillary or inguinal regions LUNGS: clear to auscultation b/l with normal respiratory effort HEART: regular rate & rhythm ABDOMEN:  normoactive bowel sounds , non tender, not distended. No palpable hepatosplenomegaly.  Extremity: no pedal edema PSYCH: alert & oriented x 3 with fluent speech NEURO: no focal motor/sensory deficits  LABORATORY DATA:  I have reviewed the data as listed  . CBC Latest Ref Rng & Units 03/07/2020 10/21/2019 07/22/2019  WBC 4.0 - 10.5 K/uL 8.9 6.9 8.0  Hemoglobin 12.0 - 15.0 g/dL 12.7 12.1 13.4  Hematocrit 36 - 46 % 41.0 39.0 43.1  Platelets 150 - 400 K/uL 426(H) 375 314    . CMP Latest Ref Rng & Units  03/07/2020 03/05/2020 11/22/2019  Glucose 70 - 99 mg/dL 78 81 75  BUN 8 - 23 mg/dL 25(H) 17 21  Creatinine 0.44 - 1.00 mg/dL 1.00 0.94 0.90  Sodium 135 - 145 mmol/L 142 142 141  Potassium 3.5 - 5.1 mmol/L 3.4(L) 3.6 3.7  Chloride 98 - 111 mmol/L 103 104 101  CO2 22 - 32 mmol/L $RemoveB'27 22 24  'AUjWHswt$ Calcium 8.9 - 10.3 mg/dL 10.0 9.6 9.5  Total Protein 6.5 - 8.1 g/dL 7.0 6.2 6.3  Total Bilirubin 0.3 - 1.2 mg/dL 0.5 0.3 0.5  Alkaline Phos 38 - 126 U/L 69 70 66  AST 15 - 41 U/L $Remo'29 23 18  'INDdN$ ALT 0 - 44 U/L $Remo'25 20 15   'XTsdO$ 06/07/08 JAK2:    RADIOGRAPHIC STUDIES: I have personally reviewed the radiological images as listed and agreed with the findings in the report. No results found.  ASSESSMENT & PLAN:  79 y.o. female with  1. 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  2. 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 S/p Lumpectomy, Radiation, 4 cycles of Cytoxan and Taxotere. Completed maintenance Arimidex until Fall 2019 Continues with annual mammograms with OBGYN  Dr. Donalynn Furlong.  PLAN: -Discussed pt labwork today, 03/07/20; PLT slightly higher than goal, WBC & Hgb are nml, blood chemistries are okay. -Advised pt that the increase in her PLT could be reactive from her recent procedure.  -The pt has no prohibitive toxicities from continuing $RemoveBeforeD'500mg'bRCcVXiCBNNHQK$  Hydroxyurea daily, at this time. -Advised pt that risk reduction for blood clots includes optimizing anticoagulation and controlling her Essential Thrombocytosis.  -Advised pt that anticoagulation could be causing her increased bruising. Will need to balance blood clotting and bleeding risk.  -Recommend pt use warm compress for lower left leg hematoma.  -Recommend pt decrease Xarelto to 15 mg daily.   -Refill Xarelto -Will see back in 3 months with labs     FOLLOW UP: RTC with Dr Irene Limbo with labs in 3 months   The total time spent in the appt was 20 minutes and more than 50% was on counseling and direct patient cares.  All of the patient's questions were answered with apparent satisfaction. The patient knows to call the clinic with any problems, questions or concerns.   Sullivan Lone MD [place] AAHIVMS [facility] Hematology/Oncology Physician [facility]  (Office):       [phone] (Work cell):  [phone] (Fax):           [phone]  03/07/2020 12:11 PM  I, Yevette Edwards, am acting as a scribe for Dr. Sullivan Lone.   .I have reviewed the above documentation for accuracy and completeness, and I agree with the above. Brunetta Genera MD

## 2020-03-07 NOTE — Telephone Encounter (Addendum)
Spoke with patient, she had a colon on 02/29/20. Pt states that she began taking Miralax the day after her procedure. Advised that it can take several days for her bowels to return to her normal. Pt states that she understands that and was not worried at the 4th or 5th day but it is now the 7th day. Advised patient that she completely cleaned out her colon for the procedure, she states that she has been eating a lot of raw vegetables, about 25 tomatoes at a time. Pt states that she is taking 1 capful of Miralax a day, advised patient that if she has a diet high in fiber it can cause constipation. Advised patient that she can take up to 3 doses of Miralax a day until she begins to have a regular BM. Advised patient to titrate Miralax for the next couple of days and if no relief to give [place] a call back. Patient verbalized understanding and had no other concerns at the end of the call.

## 2020-03-14 ENCOUNTER — Telehealth: Payer: Self-pay | Admitting: Hematology

## 2020-03-14 NOTE — Telephone Encounter (Signed)
Scheduled appt per 12/15 sch msg - left message for patient with appt date and time  For next follow up

## 2020-04-15 ENCOUNTER — Other Ambulatory Visit: Payer: Self-pay | Admitting: Hematology

## 2020-05-07 ENCOUNTER — Telehealth: Payer: Self-pay | Admitting: Gastroenterology

## 2020-05-07 MED ORDER — HYOSCYAMINE SULFATE 0.125 MG SL SUBL: SUBLINGUAL_TABLET | SUBLINGUAL | 11 refills | Status: DC

## 2020-05-07 NOTE — Telephone Encounter (Signed)
Rx sent to pharmacy as requested.

## 2020-05-07 NOTE — Telephone Encounter (Signed)
Ok, refill at her requested dose for 1 year.  thanks

## 2020-05-07 NOTE — Telephone Encounter (Signed)
Phone call made to patient's husband [name].  I explained to [name], Dr Ardis Hughs' recommendation of increasing LevBID (hyoscyamine) 0.375 mg tablets to one tablet twice daily.  [name] is a Software engineer and states that they have tried this in the past and she was not able to tolerate the increased dose.  [name] would rather he continue as she has been in the past.

## 2020-05-07 NOTE — Telephone Encounter (Signed)
Actually lets try levBID 0.375mg  pills, take on pill BID, disp 60 with 11 refills.  This may be a better way to keep a steady amount of the medicine in her system.   Thanks

## 2020-05-09 DIAGNOSIS — J479 Bronchiectasis, uncomplicated: Secondary | ICD-10-CM | POA: Diagnosis not present

## 2020-05-09 DIAGNOSIS — I129 Hypertensive chronic kidney disease with stage 1 through stage 4 chronic kidney disease, or unspecified chronic kidney disease: Secondary | ICD-10-CM | POA: Diagnosis not present

## 2020-05-09 DIAGNOSIS — E7800 Pure hypercholesterolemia, unspecified: Secondary | ICD-10-CM | POA: Diagnosis not present

## 2020-05-09 DIAGNOSIS — M169 Osteoarthritis of hip, unspecified: Secondary | ICD-10-CM | POA: Diagnosis not present

## 2020-05-09 DIAGNOSIS — M810 Age-related osteoporosis without current pathological fracture: Secondary | ICD-10-CM | POA: Diagnosis not present

## 2020-05-09 DIAGNOSIS — J471 Bronchiectasis with (acute) exacerbation: Secondary | ICD-10-CM | POA: Diagnosis not present

## 2020-05-09 DIAGNOSIS — N183 Chronic kidney disease, stage 3 unspecified: Secondary | ICD-10-CM | POA: Diagnosis not present

## 2020-05-09 DIAGNOSIS — K219 Gastro-esophageal reflux disease without esophagitis: Secondary | ICD-10-CM | POA: Diagnosis not present

## 2020-05-09 DIAGNOSIS — I2510 Atherosclerotic heart disease of native coronary artery without angina pectoris: Secondary | ICD-10-CM | POA: Diagnosis not present

## 2020-05-22 DIAGNOSIS — I129 Hypertensive chronic kidney disease with stage 1 through stage 4 chronic kidney disease, or unspecified chronic kidney disease: Secondary | ICD-10-CM | POA: Diagnosis not present

## 2020-05-22 DIAGNOSIS — R2689 Other abnormalities of gait and mobility: Secondary | ICD-10-CM | POA: Diagnosis not present

## 2020-05-22 DIAGNOSIS — C946 Myelodysplastic disease, not classified: Secondary | ICD-10-CM | POA: Diagnosis not present

## 2020-05-22 DIAGNOSIS — R531 Weakness: Secondary | ICD-10-CM | POA: Diagnosis not present

## 2020-05-22 DIAGNOSIS — R5383 Other fatigue: Secondary | ICD-10-CM | POA: Diagnosis not present

## 2020-05-22 DIAGNOSIS — R519 Headache, unspecified: Secondary | ICD-10-CM | POA: Diagnosis not present

## 2020-05-28 ENCOUNTER — Encounter: Payer: Medicare Other | Admitting: Obstetrics and Gynecology

## 2020-05-28 NOTE — Telephone Encounter (Signed)
Spoke with Zakeria and her husband [name] regarding a letter received from [facility] indicating that hyoscyamine is not included on the list of formulary drugs.  [name] is a retired Software engineer and states that they have been using GoodRx to help cover the cost of hyoscyamine.  He states that using GoodRx makes the cost of hyoscyamine affordable.  They do not wish to change medication, request prior approval, or request an exception at this time.   Patient will continue hyoscyamine at this time and contact our office with any further issues.  Patient agreed to plan and verbalized understanding.  No further questions.

## 2020-05-30 ENCOUNTER — Ambulatory Visit (INDEPENDENT_AMBULATORY_CARE_PROVIDER_SITE_OTHER): Payer: Medicare Other | Admitting: Obstetrics and Gynecology

## 2020-05-30 ENCOUNTER — Encounter: Payer: Self-pay | Admitting: Obstetrics and Gynecology

## 2020-05-30 ENCOUNTER — Other Ambulatory Visit: Payer: Self-pay

## 2020-05-30 VITALS — BP 116/70 | HR 84 | Ht 59.25 in | Wt 94.0 lb

## 2020-05-30 DIAGNOSIS — M810 Age-related osteoporosis without current pathological fracture: Secondary | ICD-10-CM

## 2020-05-30 DIAGNOSIS — Z01419 Encounter for gynecological examination (general) (routine) without abnormal findings: Secondary | ICD-10-CM

## 2020-05-30 DIAGNOSIS — Z853 Personal history of malignant neoplasm of breast: Secondary | ICD-10-CM | POA: Diagnosis not present

## 2020-05-30 NOTE — Progress Notes (Addendum)
Gwendolyn Williams 1941-01-02 [id number]  SUBJECTIVE:  80 y.o. G2P2002 female for annual routine breast and pelvic exam. She has no gynecologic concerns.    Current Outpatient Medications  Medication Sig Dispense Refill  . acetaminophen (TYLENOL) 500 MG tablet 2 tablets    . albuterol (VENTOLIN HFA) 108 (90 Base) MCG/ACT inhaler Inhale 2 puffs into the lungs every 6 (six) hours as needed for wheezing or shortness of breath. 18 g 3  . amLODipine (NORVASC) 2.5 MG tablet Take 1 tablet (2.5 mg total) by mouth daily. TO BE TAKEN WITH THE 5 MG TABLET TO EQUAL 7.5 MG DAILY 90 tablet 3  . amLODipine (NORVASC) 5 MG tablet Take 5 mg by mouth daily.    Marland Kitchen BAYER ASPIRIN EC LOW DOSE 81 MG EC tablet Take by mouth.    . Biotin 10 MG TABS 1 tablet    . Cholecalciferol (VITAMIN D) 2000 units CAPS Take 4,000 Units by mouth daily.     . Coenzyme Q10 (CO Q-10) 100 MG CAPS 1 capsule with a meal    . diazepam (VALIUM) 5 MG tablet 1 tablet    . docusate sodium (COLACE) 100 MG capsule Take 1 capsule (100 mg total) by mouth 2 (two) times daily. (Patient taking differently: Take 100 mg by mouth daily.) 10 capsule 0  . esomeprazole (NEXIUM) 40 MG capsule 1 capsule    . fluticasone furoate-vilanterol (BREO ELLIPTA) 200-25 MCG/INH AEPB USE 1 INHALATION DAILY 741 each 3  . folic acid (FOLVITE) 1 MG tablet Take 1 mg by mouth daily.    . hydroxyurea (HYDREA) 500 MG capsule TAKE ONE CAPSULE BY MOUTH DAILY WITH FOOD TO MINIMIZE GI SIDE EFFECTS 90 capsule 0  . hyoscyamine (LEVSIN SL) 0.125 MG SL tablet Take 1 to 3 tablets three times daily as needed 90 tablet 11  . levothyroxine (SYNTHROID) 25 MCG tablet 1 tablet in the morning on an empty stomach    . ondansetron (ZOFRAN) 4 MG tablet Take 1 tablet (4 mg total) by mouth every 6 (six) hours as needed for nausea. 30 tablet 0  . polyethylene glycol (MIRALAX / GLYCOLAX) packet Take 17 g by mouth 2 (two) times daily. (Patient taking differently: Take 17 g by mouth daily.) 14 each 0   . Rivaroxaban (XARELTO) 15 MG TABS tablet Take 1 tablet (15 mg total) by mouth daily with supper. 30 tablet 3  . rosuvastatin (CRESTOR) 40 MG tablet Take 1 tablet (40 mg total) by mouth daily. 90 tablet 3  . Spacer/Aero-Holding Chambers DEVI 1 Device by Does not apply route as directed. 1 each 0  . triamcinolone (KENALOG) 0.1 % 1 application sparingly to affected area    . triamterene-hydrochlorothiazide (DYAZIDE) 37.5-25 MG capsule Take 1 capsule by mouth daily.     No current facility-administered medications for this visit.   Allergies: Lipitor [atorvastatin calcium], Penicillins, Sulfa antibiotics, and Tamiflu [oseltamivir phosphate]  No LMP recorded. Patient is postmenopausal.  Past medical history,surgical history, problem list, medications, allergies, family history and social history were all reviewed and documented as reviewed in the EPIC chart.  ROS: Pertinent positives and negatives as reviewed in HPI   OBJECTIVE:  BP 116/70 (BP Location: Left Arm, Patient Position: Sitting, Cuff Size: Normal)   Pulse 84   Ht 4' 11.25" (1.505 m)   Wt 94 lb (42.6 kg)   BMI 18.83 kg/m  The patient appears well, alert, oriented, in no distress.  BREAST EXAM: breasts appear normal, no suspicious masses, no skin  or nipple changes or axillary nodes, previous left breast lumpectomy scar noted  PELVIC EXAM: VULVA: normal appearing vulva with no masses, tenderness or lesions, atrophic changes noted, VAGINA: normal appearing vagina with normal color and discharge, no lesions, pediatric sized speculum utilized, CERVIX: normal appearing cervix without discharge or lesions, UTERUS: uterus is normal size, shape, consistency and nontender, ADNEXA: normal adnexa in size, nontender and no masses, RECTAL: normal rectal, no masses  Chaperone: Terence Lux present during the examination  ASSESSMENT:  80 y.o. G2P2002 here for annual breast and pelvic exam  PLAN:   1. Postmenopausal.  Denies vaginal  bleeding or significant menopausal symptoms. 2. Pap smear 2014.  No significant history of abnormal Pap smears.  She is comfortable with not continuing screening based on age criteria. 3. Mammogram reported fall 2021 per patient [facility]).  Request records.  History of left breast cancer.  Normal breast exam today, NED.  She is reminded to schedule an annual mammogram this year when due. 4. Colonoscopy 2016.  Recommended that she follow up at the recommended interval.   5. Osteoporosis. DEXA 12/2017.  Managed by her primary physician.  6. Health maintenance.  No labs today as she normally has these completed elsewhere. The patient is aware that I will only be at this practice through the end of the week so she knows to make sure she requests follow-up on any results that may not have been able to be reviewed before then.   Return 1-2 yrs or sooner, prn.  Joseph Pierini MD  05/30/20

## 2020-05-31 DIAGNOSIS — Z85828 Personal history of other malignant neoplasm of skin: Secondary | ICD-10-CM | POA: Diagnosis not present

## 2020-05-31 DIAGNOSIS — L578 Other skin changes due to chronic exposure to nonionizing radiation: Secondary | ICD-10-CM | POA: Diagnosis not present

## 2020-05-31 DIAGNOSIS — L821 Other seborrheic keratosis: Secondary | ICD-10-CM | POA: Diagnosis not present

## 2020-05-31 DIAGNOSIS — D2261 Melanocytic nevi of right upper limb, including shoulder: Secondary | ICD-10-CM | POA: Diagnosis not present

## 2020-05-31 DIAGNOSIS — L570 Actinic keratosis: Secondary | ICD-10-CM | POA: Diagnosis not present

## 2020-05-31 DIAGNOSIS — D225 Melanocytic nevi of trunk: Secondary | ICD-10-CM | POA: Diagnosis not present

## 2020-06-11 NOTE — Progress Notes (Signed)
  HEMATOLOGY/ONCOLOGY CLINIC NOTE  Date of Service: 06/12/2020  Patient Care Team: Ehinger, Robert, MD as PCP - General (Family Medicine) Vancouver, Tiffany, MD as PCP - Cardiology (Cardiology) Granfortuna, James M, MD as Consulting Physician (Oncology) Wakefield, Matthew, MD as Consulting Physician (General Surgery)  CHIEF COMPLAINTS/PURPOSE OF CONSULTATION:  Essential Thrombocytosis  HISTORY OF PRESENTING ILLNESS:   Gwendolyn Williams is a wonderful 80 y.o. female who has been referred to us by Dr. Robert Ehinger, and was previously under the care of Dr. 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 The pt reports that she is doing well overall.   The pt reports that she has seen Dr. Schooler in GI and is concerned for esophageal spasm but does not wish to proceed with the diagnostic work up with esophageal manometry. She notes that muscle relaxants help with this occasional pain. She also follows up with Dr. Wakefield in surgery to determine if this related to her previous abdominal surgeries. 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  The pt notes that she does not have any other present concerns. She notes that she had SOB at the end of last year, as part of bronchiectasis, and takes Breo once a day, denies needing her rescue inhaler. She denies positional elements to her SOB.  The pt notes that she was off Hyxdroxyurea during the Fall of 2019 during this work up. She then returned to 500mg Hydoxyurea once a day and her PLT have normalized. She takes this in the morning with her food and denies any problems tolerating this medication.   The pt denies any concerns for bleeding at this time.  The pt continues with annual mammograms with her OBGYN Dr. Timothy Fontaine. The pt pursued maintenance Arimidex for 8.5 years, and stopped during the  evaluation of her respiratory symptoms in Fall 2019. The pt completes a bone density study every year, and notes she has stable osteopenia. She takes Vitamin D replacement and notes her levels are good.  She continues on 20mg Xarelto. She continues on 81mg aspirin as well.  The pt notes that she has had chronic mouth ulcers for "decades." She has tried Vitamin B complex and mouthwashes. She notes that she routinely gets mouth ulcers after her dental cleanings as well.  Most recent lab results (06/22/18) of CBC w/diff and CMP is as follows: all values are WNL except for RDW at 25.2, Potassium at 3.3, BUN at 30, Creatinine at 1.23, GFR at 43.  On review of systems, pt reports intermittent esophageal discomfort, stable breathing, good energy levels, some abdominal tenderness, chronic mouth ulcers, and denies concerns for bleeding, and any other symptoms.  Interval History:   Gwendolyn Williams returns today for management and evaluation of her Essential Thrombocytosis. The patient's last visit with us was on 03/14/2020. The pt reports that she is doing well overall. We are joined today by her husband.  The pt reports that she went to Dr. Ehinger due to feelings of fatigue and weakness in legs. She was put on 25 mcg of thyroid medication. The pt's husband notes that her appetite has been less. The pt notes that she is still stressed. She believes that spring should improve this, as she tries to walk daily. Compared to her baseline, her appetite is decreased and she gets fuller quicker. The pt had issues with leakage and Shingles   that she believes is due to increased age. The pt notes that this has subsided at this time. The pt notes that she had a colonoscopy that came back with no negative results. The pt notes that having the Shingles and wearing pads due to leakage messed with her psyche and was very psychologically unpleasant. The pt notes that she has recently been getting more bruised and black and blue  in color. The pt notes that these last a while and are spontaneous. She also notes some hematomas on her legs that seem deeper and as though they were lumps under the skin.   Lab results today 06/12/2020 of CBC w/diff and CMP is as follows: all values are WNL except for RDW of 18.1, Creatinine of 1.03, AST of 53, GFR est of 56.  On review of systems, pt reports bruising, purpura, hematomas in leg and denies bleeding issues, abdominal pain, back pain, SOB, leg swelling, and any other symptoms.  MEDICAL HISTORY:  Past Medical History:  Diagnosis Date  . Arthritis   . Asthma    controlled with singulair  . Breast cancer (HCC)    stage I left breast  . Current use of long term anticoagulation 04/21/2017  . Dyspnea, unspecified 02/24/2017   Started coincident with Hydrea, exertional and orthostatic, normal exam. O2 sat 100%.  . Family history of breast cancer   . GERD (gastroesophageal reflux disease)   . H/O blood clots    clotting disorder since 2010  . Hematoma of arm 08/09/2012  . Hypercoagulable state, primary (HCC)    JAK2 gene defect, thrombocythemia  . Hyperlipidemia   . Hypertension   . 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 Pneumonia    viral pneumonia as a kid  . PONV (postoperative nausea and vomiting)     SURGICAL HISTORY: Past Surgical History:  Procedure Laterality Date  . BREAST SURGERY     Left breast lumpectomy sentinel node biopsy  . CERVICAL SPINE SURGERY  05/23/2008  . COLECTOMY  04/11/2008   left colectomy with end colostomy due to clot  . COLOSTOMY TAKEDOWN  08/14/2008  . EYE SURGERY     bil cataracts  . HERNIA REPAIR  2011   [facility]  . INTRAVASCULAR PRESSURE WIRE/FFR STUDY N/A 01/25/2018   Procedure: INTRAVASCULAR PRESSURE WIRE/FFR STUDY;  Surgeon: End, Christopher, MD;  Location: [facility];  Service: Cardiovascular;  Laterality: N/A;  . LEFT HEART CATH AND CORONARY ANGIOGRAPHY N/A 01/25/2018    Procedure: LEFT HEART CATH AND CORONARY ANGIOGRAPHY;  Surgeon: End, Christopher, MD;  Location: [facility];  Service: Cardiovascular;  Laterality: N/A;  . LUMBAR DISC SURGERY  2006  . TOTAL HIP ARTHROPLASTY Left 10/07/2016   Procedure: LEFT TOTAL HIP ARTHROPLASTY ANTERIOR APPROACH;  Surgeon: Olin, Matthew, MD;  Location: [facility];  Service: Orthopedics;  Laterality: Left;  70 mins    SOCIAL HISTORY: Social History   Socioeconomic History  . Marital status: Married    Spouse name: Not on file  . Number of children: Not on file  . Years of education: Not on file  . Highest education level: Not on file  Occupational History  . Not on file  Tobacco Use  . Smoking status: Former Smoker    Packs/day: 1.00    Years: 5.00    Pack years: 5.00    Start date: 1959    Quit date: 1964    Years since quitting: 58.2  .   Smokeless tobacco: Never Used  Vaping Use  . Vaping Use: Never used  Substance and Sexual Activity  . Alcohol use: Yes    Alcohol/week: 0.0 standard drinks    Comment: Rare  . Drug use: No  . Sexual activity: Not Currently    Partners: Male    Comment: 1st intercourse 80 yo-Fewer than 5 partners  Other Topics Concern  . Not on file  Social History Narrative  . Not on file   Social Determinants of Health   Financial Resource Strain: Not on file  Food Insecurity: Not on file  Transportation Needs: Not on file  Physical Activity: Not on file  Stress: Not on file  Social Connections: Not on file  Intimate Partner Violence: Not on file    FAMILY HISTORY: Family History  Problem Relation Age of Onset  . Breast cancer Mother 40  . Lung cancer Mother   . Heart disease Father   . Basal cell carcinoma Father        x2  . CAD Father   . Ovarian cysts Sister   . Dementia Sister   . Breast cancer Maternal Aunt 55  . Breast cancer Paternal Aunt 75  . Ovarian cysts Maternal Grandmother        possibly had ovarian cancer as well  . Breast cancer Maternal Aunt  55  . Cancer Other        either pancreatic or colon  . Other Daughter 50       breast lumpectomy- complex sclerosing lesion  . Polycystic ovary syndrome Daughter   . Bladder Cancer Cousin     ALLERGIES:  is allergic to lipitor [atorvastatin calcium], penicillins, sulfa antibiotics, and tamiflu [oseltamivir phosphate].  MEDICATIONS:  Current Outpatient Medications  Medication Sig Dispense Refill  . acetaminophen (TYLENOL) 500 MG tablet 2 tablets    . albuterol (VENTOLIN HFA) 108 (90 Base) MCG/ACT inhaler Inhale 2 puffs into the lungs every 6 (six) hours as needed for wheezing or shortness of breath. 18 g 3  . amLODipine (NORVASC) 2.5 MG tablet Take 1 tablet (2.5 mg total) by mouth daily. TO BE TAKEN WITH THE 5 MG TABLET TO EQUAL 7.5 MG DAILY 90 tablet 3  . amLODipine (NORVASC) 5 MG tablet Take 5 mg by mouth daily.    . BAYER ASPIRIN EC LOW DOSE 81 MG EC tablet Take by mouth.    . Biotin 10 MG TABS 1 tablet    . Cholecalciferol (VITAMIN D) 2000 units CAPS Take 4,000 Units by mouth daily.     . Coenzyme Q10 (CO Q-10) 100 MG CAPS 1 capsule with a meal    . diazepam (VALIUM) 5 MG tablet 1 tablet    . docusate sodium (COLACE) 100 MG capsule Take 1 capsule (100 mg total) by mouth 2 (two) times daily. (Patient taking differently: Take 100 mg by mouth daily.) 10 capsule 0  . esomeprazole (NEXIUM) 40 MG capsule 1 capsule    . fluticasone furoate-vilanterol (BREO ELLIPTA) 200-25 MCG/INH AEPB USE 1 INHALATION DAILY 180 each 3  . folic acid (FOLVITE) 1 MG tablet Take 1 mg by mouth daily.    . hydroxyurea (HYDREA) 500 MG capsule TAKE ONE CAPSULE BY MOUTH DAILY WITH FOOD TO MINIMIZE GI SIDE EFFECTS 90 capsule 0  . hyoscyamine (LEVSIN SL) 0.125 MG SL tablet Take 1 to 3 tablets three times daily as needed 90 tablet 11  . levothyroxine (SYNTHROID) 25 MCG tablet 1 tablet in the morning on an empty stomach    .   ondansetron (ZOFRAN) 4 MG tablet Take 1 tablet (4 mg total) by mouth every 6 (six) hours as  needed for nausea. 30 tablet 0  . polyethylene glycol (MIRALAX / GLYCOLAX) packet Take 17 g by mouth 2 (two) times daily. (Patient taking differently: Take 17 g by mouth daily.) 14 each 0  . rosuvastatin (CRESTOR) 40 MG tablet Take 1 tablet (40 mg total) by mouth daily. 90 tablet 3  . Spacer/Aero-Holding Chambers DEVI 1 Device by Does not apply route as directed. 1 each 0  . triamcinolone (KENALOG) 0.1 % 1 application sparingly to affected area    . triamterene-hydrochlorothiazide (DYAZIDE) 37.5-25 MG capsule Take 1 capsule by mouth daily.     No current facility-administered medications for this visit.    REVIEW OF SYSTEMS:   10 Point review of Systems was done is negative except as noted above.  PHYSICAL EXAMINATION: ECOG FS:2 - Symptomatic, <50% confined to bed  Vitals:   06/12/20 0235  BP: 118/78  Pulse: 77  Resp: 18  Temp: 97.8 F (36.6 C)  SpO2: 98%   Wt Readings from Last 3 Encounters:  06/12/20 95 lb 9.6 oz (43.4 kg)  05/30/20 94 lb (42.6 kg)  03/07/20 96 lb 3.2 oz (43.6 kg)   Body mass index is 19.15 kg/m.    GENERAL:alert, in no acute distress and comfortable SKIN: no acute rashes, no significant lesions EYES: conjunctiva are pink and non-injected, sclera anicteric OROPHARYNX: MMM, no exudates, no oropharyngeal erythema or ulceration NECK: supple, no JVD LYMPH:  no palpable lymphadenopathy in the cervical, axillary or inguinal regions LUNGS: clear to auscultation b/l with normal respiratory effort HEART: regular rate & rhythm ABDOMEN:  normoactive bowel sounds , non tender, not distended. Extremity: no pedal edema PSYCH: alert & oriented x 3 with fluent speech NEURO: no focal motor/sensory deficits  LABORATORY DATA:  I have reviewed the data as listed  . CBC Latest Ref Rng & Units 06/12/2020 03/07/2020 10/21/2019  WBC 4.0 - 10.5 K/uL 8.9 8.9 6.9  Hemoglobin 12.0 - 15.0 g/dL 12.1 12.7 12.1  Hematocrit 36.0 - 46.0 % 40.2 41.0 39.0  Platelets 150 - 400 K/uL  358 426(H) 375    . CMP Latest Ref Rng & Units 06/12/2020 03/07/2020 03/05/2020  Glucose 70 - 99 mg/dL 85 78 81  BUN 8 - 23 mg/dL 18 25(H) 17  Creatinine 0.44 - 1.00 mg/dL 1.03(H) 1.00 0.94  Sodium 135 - 145 mmol/L 139 142 142  Potassium 3.5 - 5.1 mmol/L 3.7 3.4(L) 3.6  Chloride 98 - 111 mmol/L 105 103 104  CO2 22 - 32 mmol/L 25 27 22  Calcium 8.9 - 10.3 mg/dL 8.9 10.0 9.6  Total Protein 6.5 - 8.1 g/dL 6.6 7.0 6.2  Total Bilirubin 0.3 - 1.2 mg/dL 0.6 0.5 0.3  Alkaline Phos 38 - 126 U/L 87 69 70  AST 15 - 41 U/L 53(H) 29 23  ALT 0 - 44 U/L 37 25 20   06/07/08 JAK2:    RADIOGRAPHIC STUDIES: I have personally reviewed the radiological images as listed and agreed with the findings in the report. No results found.  ASSESSMENT & PLAN:  80 y.o. female with  1. 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  2. 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 S/p Lumpectomy, Radiation, 4 cycles of Cytoxan and Taxotere. Completed   maintenance Arimidex until Fall 2019 Continues with annual mammograms with OBGYN Dr. Timothy Fontaine.  PLAN: -Discussed pt labwork today, 06/12/2020; blood counts normal, chemistries stable. Plt normalized. -Recommended pt use cycling shirt or compression socks for supporting blood vessels in arms. Recommended staying very well moisturized. -Advised pt that hematomas are much more concerning against blood thinners than purpuras.  -Recommended pt stay physically active. -Continue Vitamin D. -The pt has no prohibitive toxicities from continuing 500mg Hydroxyurea daily at this time. -Will decrease Xarelto to 10 mg daily. Advised pt she can wait to switch until current supply is done. -Will see back in in 4 months with labs.   FOLLOW UP: RTC with Dr Kale with labs  in 4 months   The total time spent in the appointment was 20 minutes and more than 50% was on counseling and direct patient cares.   All of the patient's questions were answered with apparent satisfaction. The patient knows to call the clinic with any problems, questions or concerns.   Gautam Kale MD MS AAHIVMS [facility] C[facility] Hematology/Oncology Physician [facility]  (Office):       [phone] (Work cell):  [phone] (Fax):           [phone]  06/12/2020 3:27 PM  I, Ross Judd, am acting as scribe for Dr. Gautam Kale, MD.     .I have reviewed the above documentation for accuracy and completeness, and I agree with the above. .Gautam Kishore Kale MD      

## 2020-06-12 ENCOUNTER — Other Ambulatory Visit: Payer: Self-pay

## 2020-06-12 ENCOUNTER — Inpatient Hospital Stay: Payer: Medicare Other

## 2020-06-12 ENCOUNTER — Telehealth: Payer: Self-pay | Admitting: Hematology

## 2020-06-12 ENCOUNTER — Inpatient Hospital Stay: Payer: Medicare Other | Attending: Hematology | Admitting: Hematology

## 2020-06-12 VITALS — BP 118/78 | HR 77 | Temp 97.8°F | Resp 18 | Ht 59.25 in | Wt 95.6 lb

## 2020-06-12 DIAGNOSIS — Z8052 Family history of malignant neoplasm of bladder: Secondary | ICD-10-CM | POA: Insufficient documentation

## 2020-06-12 DIAGNOSIS — Z853 Personal history of malignant neoplasm of breast: Secondary | ICD-10-CM | POA: Insufficient documentation

## 2020-06-12 DIAGNOSIS — D473 Essential (hemorrhagic) thrombocythemia: Secondary | ICD-10-CM

## 2020-06-12 DIAGNOSIS — Z800 Family history of malignant neoplasm of digestive organs: Secondary | ICD-10-CM | POA: Diagnosis not present

## 2020-06-12 DIAGNOSIS — R5383 Other fatigue: Secondary | ICD-10-CM | POA: Diagnosis not present

## 2020-06-12 DIAGNOSIS — N183 Chronic kidney disease, stage 3 unspecified: Secondary | ICD-10-CM | POA: Diagnosis not present

## 2020-06-12 DIAGNOSIS — K219 Gastro-esophageal reflux disease without esophagitis: Secondary | ICD-10-CM | POA: Diagnosis not present

## 2020-06-12 DIAGNOSIS — R531 Weakness: Secondary | ICD-10-CM | POA: Insufficient documentation

## 2020-06-12 DIAGNOSIS — I129 Hypertensive chronic kidney disease with stage 1 through stage 4 chronic kidney disease, or unspecified chronic kidney disease: Secondary | ICD-10-CM | POA: Diagnosis not present

## 2020-06-12 DIAGNOSIS — Z87891 Personal history of nicotine dependence: Secondary | ICD-10-CM | POA: Diagnosis not present

## 2020-06-12 DIAGNOSIS — Z801 Family history of malignant neoplasm of trachea, bronchus and lung: Secondary | ICD-10-CM | POA: Diagnosis not present

## 2020-06-12 DIAGNOSIS — Z818 Family history of other mental and behavioral disorders: Secondary | ICD-10-CM | POA: Diagnosis not present

## 2020-06-12 DIAGNOSIS — E039 Hypothyroidism, unspecified: Secondary | ICD-10-CM | POA: Diagnosis not present

## 2020-06-12 DIAGNOSIS — Z79899 Other long term (current) drug therapy: Secondary | ICD-10-CM | POA: Diagnosis not present

## 2020-06-12 DIAGNOSIS — I10 Essential (primary) hypertension: Secondary | ICD-10-CM | POA: Diagnosis not present

## 2020-06-12 DIAGNOSIS — Z803 Family history of malignant neoplasm of breast: Secondary | ICD-10-CM | POA: Insufficient documentation

## 2020-06-12 DIAGNOSIS — M8580 Other specified disorders of bone density and structure, unspecified site: Secondary | ICD-10-CM | POA: Insufficient documentation

## 2020-06-12 DIAGNOSIS — Z842 Family history of other diseases of the genitourinary system: Secondary | ICD-10-CM | POA: Insufficient documentation

## 2020-06-12 DIAGNOSIS — E7800 Pure hypercholesterolemia, unspecified: Secondary | ICD-10-CM | POA: Diagnosis not present

## 2020-06-12 DIAGNOSIS — J479 Bronchiectasis, uncomplicated: Secondary | ICD-10-CM | POA: Diagnosis not present

## 2020-06-12 DIAGNOSIS — Z808 Family history of malignant neoplasm of other organs or systems: Secondary | ICD-10-CM | POA: Insufficient documentation

## 2020-06-12 DIAGNOSIS — I2510 Atherosclerotic heart disease of native coronary artery without angina pectoris: Secondary | ICD-10-CM | POA: Diagnosis not present

## 2020-06-12 DIAGNOSIS — J471 Bronchiectasis with (acute) exacerbation: Secondary | ICD-10-CM | POA: Diagnosis not present

## 2020-06-12 DIAGNOSIS — Z8249 Family history of ischemic heart disease and other diseases of the circulatory system: Secondary | ICD-10-CM | POA: Diagnosis not present

## 2020-06-12 LAB — CBC WITH DIFFERENTIAL/PLATELET
Abs Immature Granulocytes: 0.06 10*3/uL (ref 0.00–0.07)
Basophils Absolute: 0.1 10*3/uL (ref 0.0–0.1)
Basophils Relative: 1 %
Eosinophils Absolute: 0.1 10*3/uL (ref 0.0–0.5)
Eosinophils Relative: 1 %
HCT: 40.2 % (ref 36.0–46.0)
Hemoglobin: 12.1 g/dL (ref 12.0–15.0)
Immature Granulocytes: 1 %
Lymphocytes Relative: 9 %
Lymphs Abs: 0.8 10*3/uL (ref 0.7–4.0)
MCH: 26.3 pg (ref 26.0–34.0)
MCHC: 30.1 g/dL (ref 30.0–36.0)
MCV: 87.4 fL (ref 80.0–100.0)
Monocytes Absolute: 0.3 10*3/uL (ref 0.1–1.0)
Monocytes Relative: 4 %
Neutro Abs: 7.6 10*3/uL (ref 1.7–7.7)
Neutrophils Relative %: 84 %
Platelets: 358 10*3/uL (ref 150–400)
RBC: 4.6 MIL/uL (ref 3.87–5.11)
RDW: 18.1 % — ABNORMAL HIGH (ref 11.5–15.5)
WBC: 8.9 10*3/uL (ref 4.0–10.5)
nRBC: 0 % (ref 0.0–0.2)

## 2020-06-12 LAB — CMP (CANCER CENTER ONLY)
ALT: 37 U/L (ref 0–44)
AST: 53 U/L — ABNORMAL HIGH (ref 15–41)
Albumin: 3.6 g/dL (ref 3.5–5.0)
Alkaline Phosphatase: 87 U/L (ref 38–126)
Anion gap: 9 (ref 5–15)
BUN: 18 mg/dL (ref 8–23)
CO2: 25 mmol/L (ref 22–32)
Calcium: 8.9 mg/dL (ref 8.9–10.3)
Chloride: 105 mmol/L (ref 98–111)
Creatinine: 1.03 mg/dL — ABNORMAL HIGH (ref 0.44–1.00)
GFR, Estimated: 56 mL/min — ABNORMAL LOW (ref >60)
Glucose, Bld: 85 mg/dL (ref 70–99)
Potassium: 3.7 mmol/L (ref 3.5–5.1)
Sodium: 139 mmol/L (ref 135–145)
Total Bilirubin: 0.6 mg/dL (ref 0.3–1.2)
Total Protein: 6.6 g/dL (ref 6.5–8.1)

## 2020-06-12 MED ORDER — RIVAROXABAN 10 MG PO TABS: 10.0000 mg | ORAL_TABLET | Freq: Every day | ORAL | 3 refills | Status: DC

## 2020-06-12 NOTE — Telephone Encounter (Signed)
Scheduled per los. Declined printout  

## 2020-06-28 DIAGNOSIS — H524 Presbyopia: Secondary | ICD-10-CM | POA: Diagnosis not present

## 2020-06-28 DIAGNOSIS — H5211 Myopia, right eye: Secondary | ICD-10-CM | POA: Diagnosis not present

## 2020-06-28 DIAGNOSIS — Z961 Presence of intraocular lens: Secondary | ICD-10-CM | POA: Diagnosis not present

## 2020-06-28 DIAGNOSIS — H26493 Other secondary cataract, bilateral: Secondary | ICD-10-CM | POA: Diagnosis not present

## 2020-07-05 DIAGNOSIS — Z0000 Encounter for general adult medical examination without abnormal findings: Secondary | ICD-10-CM | POA: Diagnosis not present

## 2020-07-05 DIAGNOSIS — Z1389 Encounter for screening for other disorder: Secondary | ICD-10-CM | POA: Diagnosis not present

## 2020-07-05 DIAGNOSIS — K219 Gastro-esophageal reflux disease without esophagitis: Secondary | ICD-10-CM | POA: Diagnosis not present

## 2020-07-05 DIAGNOSIS — Z853 Personal history of malignant neoplasm of breast: Secondary | ICD-10-CM | POA: Diagnosis not present

## 2020-07-05 DIAGNOSIS — N183 Chronic kidney disease, stage 3 unspecified: Secondary | ICD-10-CM | POA: Diagnosis not present

## 2020-07-05 DIAGNOSIS — M810 Age-related osteoporosis without current pathological fracture: Secondary | ICD-10-CM | POA: Diagnosis not present

## 2020-07-05 DIAGNOSIS — E7800 Pure hypercholesterolemia, unspecified: Secondary | ICD-10-CM | POA: Diagnosis not present

## 2020-07-05 DIAGNOSIS — C946 Myelodysplastic disease, not classified: Secondary | ICD-10-CM | POA: Diagnosis not present

## 2020-07-05 DIAGNOSIS — I129 Hypertensive chronic kidney disease with stage 1 through stage 4 chronic kidney disease, or unspecified chronic kidney disease: Secondary | ICD-10-CM | POA: Diagnosis not present

## 2020-07-05 DIAGNOSIS — I81 Portal vein thrombosis: Secondary | ICD-10-CM | POA: Diagnosis not present

## 2020-07-05 DIAGNOSIS — E559 Vitamin D deficiency, unspecified: Secondary | ICD-10-CM | POA: Diagnosis not present

## 2020-07-05 DIAGNOSIS — E039 Hypothyroidism, unspecified: Secondary | ICD-10-CM | POA: Diagnosis not present

## 2020-07-08 ENCOUNTER — Other Ambulatory Visit: Payer: Self-pay | Admitting: Hematology

## 2020-07-13 DIAGNOSIS — J479 Bronchiectasis, uncomplicated: Secondary | ICD-10-CM | POA: Diagnosis not present

## 2020-07-13 DIAGNOSIS — M169 Osteoarthritis of hip, unspecified: Secondary | ICD-10-CM | POA: Diagnosis not present

## 2020-07-13 DIAGNOSIS — N183 Chronic kidney disease, stage 3 unspecified: Secondary | ICD-10-CM | POA: Diagnosis not present

## 2020-07-13 DIAGNOSIS — E7800 Pure hypercholesterolemia, unspecified: Secondary | ICD-10-CM | POA: Diagnosis not present

## 2020-07-13 DIAGNOSIS — I2510 Atherosclerotic heart disease of native coronary artery without angina pectoris: Secondary | ICD-10-CM | POA: Diagnosis not present

## 2020-07-13 DIAGNOSIS — J471 Bronchiectasis with (acute) exacerbation: Secondary | ICD-10-CM | POA: Diagnosis not present

## 2020-07-13 DIAGNOSIS — E039 Hypothyroidism, unspecified: Secondary | ICD-10-CM | POA: Diagnosis not present

## 2020-07-13 DIAGNOSIS — M810 Age-related osteoporosis without current pathological fracture: Secondary | ICD-10-CM | POA: Diagnosis not present

## 2020-07-13 DIAGNOSIS — K219 Gastro-esophageal reflux disease without esophagitis: Secondary | ICD-10-CM | POA: Diagnosis not present

## 2020-08-02 DIAGNOSIS — R5383 Other fatigue: Secondary | ICD-10-CM | POA: Diagnosis not present

## 2020-08-02 DIAGNOSIS — M6281 Muscle weakness (generalized): Secondary | ICD-10-CM | POA: Diagnosis not present

## 2020-08-02 DIAGNOSIS — R110 Nausea: Secondary | ICD-10-CM | POA: Diagnosis not present

## 2020-08-02 DIAGNOSIS — R630 Anorexia: Secondary | ICD-10-CM | POA: Diagnosis not present

## 2020-08-06 ENCOUNTER — Encounter: Payer: Self-pay | Admitting: Neurology

## 2020-08-06 ENCOUNTER — Other Ambulatory Visit: Payer: Self-pay | Admitting: Family Medicine

## 2020-08-06 DIAGNOSIS — R591 Generalized enlarged lymph nodes: Secondary | ICD-10-CM | POA: Diagnosis not present

## 2020-08-06 DIAGNOSIS — R110 Nausea: Secondary | ICD-10-CM | POA: Diagnosis not present

## 2020-08-06 DIAGNOSIS — R634 Abnormal weight loss: Secondary | ICD-10-CM

## 2020-08-07 ENCOUNTER — Telehealth: Payer: Self-pay

## 2020-08-07 ENCOUNTER — Other Ambulatory Visit: Payer: Self-pay | Admitting: Family Medicine

## 2020-08-07 ENCOUNTER — Ambulatory Visit
Admission: RE | Admit: 2020-08-07 | Discharge: 2020-08-07 | Disposition: A | Discharge status: Home / Self Care | Payer: Medicare Other | Source: Ambulatory Visit | Attending: Family Medicine | Admitting: Family Medicine

## 2020-08-07 DIAGNOSIS — K7689 Other specified diseases of liver: Secondary | ICD-10-CM | POA: Diagnosis not present

## 2020-08-07 DIAGNOSIS — Z853 Personal history of malignant neoplasm of breast: Secondary | ICD-10-CM | POA: Diagnosis not present

## 2020-08-07 DIAGNOSIS — R591 Generalized enlarged lymph nodes: Secondary | ICD-10-CM

## 2020-08-07 DIAGNOSIS — K8020 Calculus of gallbladder without cholecystitis without obstruction: Secondary | ICD-10-CM | POA: Diagnosis not present

## 2020-08-07 DIAGNOSIS — J439 Emphysema, unspecified: Secondary | ICD-10-CM | POA: Diagnosis not present

## 2020-08-07 DIAGNOSIS — R634 Abnormal weight loss: Secondary | ICD-10-CM

## 2020-08-07 DIAGNOSIS — I2510 Atherosclerotic heart disease of native coronary artery without angina pectoris: Secondary | ICD-10-CM | POA: Diagnosis not present

## 2020-08-07 MED ORDER — IOPAMIDOL (ISOVUE-300) INJECTION 61%
80.0000 mL | Freq: Once | INTRAVENOUS | Status: AC | PRN
  Administered 2020-08-07: 80 mL via INTRAVENOUS

## 2020-08-07 NOTE — Progress Notes (Signed)
HEMATOLOGY/ONCOLOGY CLINIC NOTE  Date of Service: 08/08/2020  Patient Care Team: Gaynelle Arabian, MD as PCP - General (Family Medicine) Skeet Latch, MD as PCP - Cardiology (Cardiology) Annia Belt, MD as Consulting Physician (Oncology) Rolm Bookbinder, MD as Consulting Physician (General Surgery)  CHIEF COMPLAINTS/PURPOSE OF CONSULTATION:  Essential Thrombocytosis Newly diagnosed Liver lesions concerning for metastases  HISTORY OF PRESENTING ILLNESS:   Gwendolyn Williams is a wonderful 80 y.o. female who has been referred to [place] by Dr. Gaynelle Arabian, and was previously under the care of Dr. 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 The pt reports that she is doing well overall.   The pt reports that she has seen Dr. Michail Sermon in GI and is concerned for esophageal spasm but does not wish to proceed with the diagnostic work up with esophageal manometry. She notes that muscle relaxants help with this occasional pain. She also follows up with Dr. Donne Hazel in surgery to determine if this related to her previous abdominal surgeries. 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  The pt notes that she does not have any other present concerns. She notes that she had SOB at the end of last year, as part of bronchiectasis, and takes Memory Dance once a day, denies needing her rescue inhaler. She denies positional elements to her SOB.  The pt notes that she was off Hyxdroxyurea during the Fall of 2019 during this work up. She then returned to $RemoveBef'500mg'xLTLEELzFc$  Hydoxyurea once a day and her PLT have normalized. She takes this in the morning with her food and denies any problems tolerating this medication.   The pt denies any concerns for bleeding at this time.  The pt continues with annual mammograms with her OBGYN Dr. Donalynn Furlong. The pt pursued  maintenance Arimidex for 8.5 years, and stopped during the evaluation of her respiratory symptoms in Fall 2019. The pt completes a bone density study every year, and notes she has stable osteopenia. She takes Vitamin D replacement and notes her levels are good.  She continues on $RemoveBefo'20mg'xcbicqYvkxy$  Xarelto. She continues on $RemoveBefo'81mg'kZgxnkwPwAT$  aspirin as well.  The pt notes that she has had chronic mouth ulcers for "decades." She has tried Vitamin B complex and mouthwashes. She notes that she routinely gets mouth ulcers after her dental cleanings as well.  Most recent lab results (06/22/18) of CBC w/diff and CMP is as follows: all values are WNL except for RDW at 25.2, Potassium at 3.3, BUN at 30, Creatinine at 1.23, GFR at 43.  On review of systems, pt reports intermittent esophageal discomfort, stable breathing, good energy levels, some abdominal tenderness, chronic mouth ulcers, and denies concerns for bleeding, and any other symptoms.  Interval History:   Gwendolyn Williams returns today for management and evaluation of newly diagnosed liver lesions concerning for liver metastases. The patient's last visit with [place] was on 06/12/2020. The pt reports that she is doing well overall. We are joined today by her husband.  The pt reports that she went to Dr. Marisue Humble recently because she has been listless recently. The pt notes she has had some weakness in her legs for a few months. The pt notes recently some worsening generalized fatigue and her arms started feeling very weak. The pt notes she has a nodule in her right groin that she has had for a few months that has  been unchanged in size and is around a quarter in size. The pt notes that Dr. Marisue Humble said this was a lymph node and the CT showed it was mildly enlarged. He wanted to know what else was going on and this was the reason for the CT. The pt notes rt upper and mid abdominal pain. She notes that currently there is some distention. The pt notes she has not been eating much at all  recently. The pt's nausea has been very persistent with intermittent vomiting. The pt takes Zofran 8 mg every 6 hours. These symptoms have affected the pt's balance. The pt has lost 7-10 pounds in the last two months. The pt's cognition has also been affected and her husband notes some confusion recently.  Of note since the patient's last visit, pt has had CT C/A/P ([id number]) on 08/07/2020, which revealed "1. Innumerable liver lesions consistent with diffuse metastatic disease. Confluent lesion in the LEFT hepatic lobe is 6.5 centimeters. 2. Cholelithiasis and enhancement of the gallbladder mucosa along with a small amount of pericholecystic fluid raise the question of gallbladder disease. Consider further evaluation gallbladder ultrasound. 3. Previous colon resection with follow-up pelvic anastomosis in the LEFT mid abdomen. 4. Significant stool burden. 5. Minimally enlarged lymph nodes in the periportal region. 6.  Aortic atherosclerosis.  (ICD10-I70.0) 7. No evidence for osseous metastatic disease. 8. Aortic atherosclerosis.  (ICD10-I70.0) 9.  Emphysema (ICD10-J43.9)."  Lab results today 08/08/2020 of CBC w/diff and CMP is as follows: all values are WNL except for WBC of 12.2K, RBC of 5.38, HCT of 47.1,  RDW of 19.0, Plt of 457K, Neutro Abs of 10.4K, Potassium of 2.4, Chloride of 94, BUN of 29, Creatinine of 1.85, AST of 109, ALT of 70, Alkaline Phosphatase of 222, GFR est of 28.  On review of systems, pt reports nausea, nodule in right groin, fatigue, weakness, decreased appetite, left arm tremor, imbalance, weight loss, confusion and denies changes in bowel habits, acute diarrhea, acute constipation, fevers, chills, new lumps/bumps, and any other symptoms.  MEDICAL HISTORY:  Past Medical History:  Diagnosis Date  . Arthritis   . Asthma    controlled with singulair  . Breast cancer ([place])    stage I left breast  . Current use of long term anticoagulation 04/21/2017  . Dyspnea, unspecified  02/24/2017   Started coincident with [place], exertional and orthostatic, normal exam. O2 sat 100%.  . Family history of breast cancer   . GERD (gastroesophageal reflux disease)   . H/O blood clots    clotting disorder since 2010  . Hematoma of arm 08/09/2012  . 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 Hyperlipidemia   . Hypertension   . 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 Pneumonia    viral pneumonia as a kid  . PONV (postoperative nausea and vomiting)     SURGICAL HISTORY: Past Surgical History:  Procedure Laterality Date  . BREAST SURGERY     Left breast lumpectomy sentinel node biopsy  . CERVICAL SPINE SURGERY  05/23/2008  . COLECTOMY  04/11/2008   left colectomy with end colostomy due to clot  . COLOSTOMY TAKEDOWN  08/14/2008  . EYE SURGERY     bil cataracts  . HERNIA REPAIR  2011   [facility]  . INTRAVASCULAR PRESSURE WIRE/FFR STUDY N/A 01/25/2018   Procedure: INTRAVASCULAR PRESSURE WIRE/FFR STUDY;  Surgeon: Nelva Bush, MD;  Location: [facility];  Service: Cardiovascular;  Laterality: N/A;  . LEFT HEART CATH AND CORONARY ANGIOGRAPHY N/A 01/25/2018   Procedure: LEFT HEART CATH AND CORONARY ANGIOGRAPHY;  Surgeon: Nelva Bush, MD;  Location: [facility];  Service: Cardiovascular;  Laterality: N/A;  . LUMBAR [place] SURGERY  2006  . TOTAL HIP ARTHROPLASTY Left 10/07/2016   Procedure: LEFT TOTAL HIP ARTHROPLASTY ANTERIOR APPROACH;  Surgeon: Paralee Cancel, MD;  Location: [facility];  Service: Orthopedics;  Laterality: Left;  70 mins    SOCIAL HISTORY: Social History   Socioeconomic History  . Marital status: Married    Spouse name: Not on file  . Number of children: Not on file  . Years of education: Not on file  . Highest education level: Not on file  Occupational History  . Not on file  Tobacco Use  . Smoking status: Former Smoker    Packs/day: 1.00    Years: 5.00     Pack years: 5.00    Start date: 54    Quit date: 1964    Years since quitting: 58.3  . Smokeless tobacco: Never Used  Vaping Use  . Vaping Use: Never used  Substance and Sexual Activity  . Alcohol use: Yes    Alcohol/week: 0.0 standard drinks    Comment: Rare  . Drug use: No  . Sexual activity: Not Currently    Partners: Male    Comment: 1st intercourse 80 yo-Fewer than 5 partners  Other Topics Concern  . Not on file  Social History Narrative  . Not on file   Social Determinants of Health   Financial Resource Strain: Not on file  Food Insecurity: Not on file  Transportation Needs: Not on file  Physical Activity: Not on file  Stress: Not on file  Social Connections: Not on file  Intimate Partner Violence: Not on file    FAMILY HISTORY: Family History  Problem Relation Age of Onset  . Breast cancer Mother 47  . Lung cancer Mother   . Heart disease Father   . Basal cell carcinoma Father        x2  . CAD Father   . Ovarian cysts Sister   . Dementia Sister   . Breast cancer Maternal Aunt 44  . Breast cancer Paternal Aunt 66  . Ovarian cysts Maternal Grandmother        possibly had ovarian cancer as well  . Breast cancer Maternal Aunt 77  . Cancer Other        either pancreatic or colon  . Other Daughter 89       breast lumpectomy- complex sclerosing lesion  . Polycystic ovary syndrome Daughter   . Bladder Cancer Cousin     ALLERGIES:  is allergic to lipitor [atorvastatin calcium], penicillins, sulfa antibiotics, and tamiflu [oseltamivir phosphate].  MEDICATIONS:  Current Outpatient Medications  Medication Sig Dispense Refill  . acetaminophen (TYLENOL) 500 MG tablet 2 tablets    . albuterol (VENTOLIN HFA) 108 (90 Base) MCG/ACT inhaler Inhale 2 puffs into the lungs every 6 (six) hours as needed for wheezing or shortness of breath. 18 g 3  . amLODipine (NORVASC) 2.5 MG tablet Take 1 tablet (2.5 mg total) by mouth daily. TO BE TAKEN WITH THE 5 MG TABLET TO EQUAL  7.5 MG DAILY 90 tablet 3  . amLODipine (NORVASC) 5 MG tablet Take 5 mg by mouth daily.    Marland Kitchen BAYER ASPIRIN EC LOW DOSE 81 MG EC tablet Take by mouth.    . Biotin 10 MG  TABS 1 tablet    . Cholecalciferol (VITAMIN D) 2000 units CAPS Take 4,000 Units by mouth daily.     . Coenzyme Q10 (CO Q-10) 100 MG CAPS 1 capsule with a meal    . diazepam (VALIUM) 5 MG tablet 1 tablet    . docusate sodium (COLACE) 100 MG capsule Take 1 capsule (100 mg total) by mouth 2 (two) times daily. (Patient taking differently: Take 100 mg by mouth daily.) 10 capsule 0  . esomeprazole (NEXIUM) 40 MG capsule 1 capsule    . fluticasone furoate-vilanterol (BREO ELLIPTA) 200-25 MCG/INH AEPB USE 1 INHALATION DAILY 245 each 3  . folic acid (FOLVITE) 1 MG tablet Take 1 mg by mouth daily.    . hydroxyurea (HYDREA) 500 MG capsule TAKE ONE CAPSULE BY MOUTH DAILY WITH FOOD TO MINIMIZE GI SIDE EFFECTS 90 capsule 0  . hyoscyamine (LEVSIN SL) 0.125 MG SL tablet Take 1 to 3 tablets three times daily as needed 90 tablet 11  . levothyroxine (SYNTHROID) 25 MCG tablet 1 tablet in the morning on an empty stomach    . ondansetron (ZOFRAN) 4 MG tablet Take 1 tablet (4 mg total) by mouth every 6 (six) hours as needed for nausea. 30 tablet 0  . polyethylene glycol (MIRALAX / GLYCOLAX) packet Take 17 g by mouth 2 (two) times daily. (Patient taking differently: Take 17 g by mouth daily.) 14 each 0  . rivaroxaban (XARELTO) 10 MG TABS tablet Take 1 tablet (10 mg total) by mouth daily. 30 tablet 3  . rosuvastatin (CRESTOR) 40 MG tablet Take 1 tablet (40 mg total) by mouth daily. 90 tablet 3  . Spacer/Aero-Holding Chambers DEVI 1 Device by Does not apply route as directed. 1 each 0  . triamcinolone (KENALOG) 0.1 % 1 application sparingly to affected area    . triamterene-hydrochlorothiazide (DYAZIDE) 37.5-25 MG capsule Take 1 capsule by mouth daily.     No current facility-administered medications for this visit.    REVIEW OF SYSTEMS:   10 Point  review of Systems was done is negative except as noted above.  PHYSICAL EXAMINATION: ECOG FS:2 - Symptomatic, <50% confined to bed  Vitals:   08/08/20 1114  BP: 102/81  Pulse: 98  Resp: 18  Temp: 97.6 F (36.4 C)  SpO2: 100%   Wt Readings from Last 3 Encounters:  08/08/20 89 lb 4.8 oz (40.5 kg)  06/12/20 95 lb 9.6 oz (43.4 kg)  05/30/20 94 lb (42.6 kg)   Body mass index is 17.88 kg/m.     GENERAL:alert, in no acute distress and comfortable SKIN: no acute rashes, no significant lesions EYES: conjunctiva are pink and non-injected, sclera anicteric OROPHARYNX: MMM, no exudates, no oropharyngeal erythema or ulceration NECK: supple, no JVD LYMPH:  no palpable lymphadenopathy in the cervical, axillary regions. Small inguinal lymph nodes not very enlarged. LUNGS: clear to auscultation b/l with normal respiratory effort HEART: regular rate & rhythm ABDOMEN:  normoactive bowel sounds, much discomfort and tenderness in right upper quadrant, distended. Extremity: no pedal edema PSYCH: alert & oriented x 3 with fluent speech NEURO: no focal motor/sensory deficits  LABORATORY DATA:  I have reviewed the data as listed  . CBC Latest Ref Rng & Units 08/08/2020 06/12/2020 03/07/2020  WBC 4.0 - 10.5 K/uL 12.2(H) 8.9 8.9  Hemoglobin 12.0 - 15.0 g/dL 14.4 12.1 12.7  Hematocrit 36.0 - 46.0 % 47.1(H) 40.2 41.0  Platelets 150 - 400 K/uL 457(H) 358 426(H)    . CMP Latest Ref Rng &  Units 06/12/2020 03/07/2020 03/05/2020  Glucose 70 - 99 mg/dL 85 78 81  BUN 8 - 23 mg/dL 18 25(H) 17  Creatinine 0.44 - 1.00 mg/dL 1.03(H) 1.00 0.94  Sodium 135 - 145 mmol/L 139 142 142  Potassium 3.5 - 5.1 mmol/L 3.7 3.4(L) 3.6  Chloride 98 - 111 mmol/L 105 103 104  CO2 22 - 32 mmol/L $RemoveB'25 27 22  'TLcgorWp$ Calcium 8.9 - 10.3 mg/dL 8.9 10.0 9.6  Total Protein 6.5 - 8.1 g/dL 6.6 7.0 6.2  Total Bilirubin 0.3 - 1.2 mg/dL 0.6 0.5 0.3  Alkaline Phos 38 - 126 U/L 87 69 70  AST 15 - 41 U/L 53(H) 29 23  ALT 0 - 44 U/L 37 25 20    06/07/08 JAK2:    RADIOGRAPHIC STUDIES: I have personally reviewed the radiological images as listed and agreed with the findings in the report. CT CHEST ABDOMEN PELVIS W CONTRAST  Result Date: 08/07/2020 CLINICAL DATA:  3 pound weight loss over the past few weeks. Abdominal distention and nausea. RIGHT LOWER QUADRANT swelling. History of LEFT breast cancer with lumpectomy. History of colon resection. History of smoking. EXAM: CT CHEST, ABDOMEN, AND PELVIS WITH CONTRAST TECHNIQUE: Multidetector CT imaging of the chest, abdomen and pelvis was performed following the standard protocol during bolus administration of intravenous contrast. CONTRAST:  73mL ISOVUE-300 IOPAMIDOL (ISOVUE-300) INJECTION 61% COMPARISON:  02/12/2018 and 11/20/2016 FINDINGS: CT CHEST FINDINGS Cardiovascular: Heart size is normal. There is atherosclerotic calcification of the arteries. There is atherosclerotic calcification of the thoracic aorta not associated with aneurysm. No pericardial effusion. Pulmonary arteries are normally opacified account for the contrast bolus timing. Mediastinum/Nodes: Esophagus is normal. No mediastinal, hilar, or axillary adenopathy. A 5 millimeter nodule is identified within the LEFT lobe of the thyroid gland. Otherwise the thyroid is normal in appearance. Lungs/Pleura: There are emphysematous changes throughout the lungs. No pulmonary nodules, consolidations, or pleural effusions. There is minimal RIGHT apical scarring. Musculoskeletal: Remote LOWER cervical spine fusion. Minimal degenerative changes in the midthoracic spine. No acute fracture or subluxation. No suspicious lytic or blastic lesions. CT ABDOMEN PELVIS FINDINGS Hepatobiliary: Liver size is UPPER limits normal. The liver contains innumerable rim enhancing lesions throughout all lobes. Confluent low-attenuation is identified within the anterior aspect of the RIGHT hepatic lobe. Small amount of perihepatic fluid is present. The largest,  confluent lesion measures approximately 6.5 x 4.5 centimeters. Other more discrete rounded lesions measure from 6 millimeters to 3.0 centimeters. There is a small amount of pericholecystic fluid. Calcified gallstone measures at least 1.6 centimeters. There is enhancement of the mucosa of the gallbladder. Consider further evaluation with gallbladder ultrasound. Pancreas: Unremarkable. No pancreatic ductal dilatation or surrounding inflammatory changes. Spleen: Normal in size without focal abnormality. Adrenals/Urinary Tract: Adrenal glands are normal in appearance. A 1 centimeter simple cyst is identified in UPPER pole of the LEFT kidney. There is symmetric bilateral renal enhancement and early excretion of contrast. The ureters are unremarkable. Distal aspects of the ureters are partially obscured by a over lying artifact. The urinary bladder is otherwise normal in appearance. Stomach/Bowel: The stomach and small bowel loops are normal in appearance. Small bowel loops are normal in appearance. The appendix is not well seen. Central pelvic small bowel loops are partially obscured by metal artifact in the LEFT hip. Colo-colic anastomosis is identified in the LEFT mid abdomen and is distended with a significant amount of stool. No obstructing mass identified. Vascular/Lymphatic: There is atherosclerotic calcification of the abdominal aorta. Portacaval lymph node is 1.1  centimeters. Unknown along the dorsum of the pancreatic neck is 1.1 centimeters. Although involved by atherosclerosis, there is vascular opacification of the celiac axis, superior mesenteric artery, and inferior mesenteric artery. Normal appearance of the portal venous system and inferior vena cava. Reproductive: The uterus is present.  No adnexal mass. Other: No free pelvic fluid. Anterior abdominal wall is unremarkable. Musculoskeletal: Degenerative changes are seen in the lumbar spine. No lytic or blastic lesions are noted. Previous LEFT hip  arthroplasty. IMPRESSION: 1. Innumerable liver lesions consistent with diffuse metastatic disease. Confluent lesion in the LEFT hepatic lobe is 6.5 centimeters. 2. Cholelithiasis and enhancement of the gallbladder mucosa along with a small amount of pericholecystic fluid raise the question of gallbladder disease. Consider further evaluation gallbladder ultrasound. 3. Previous colon resection with follow-up pelvic anastomosis in the LEFT mid abdomen. 4. Significant stool burden. 5. Minimally enlarged lymph nodes in the periportal region. 6.  Aortic atherosclerosis.  (ICD10-I70.0) 7. No evidence for osseous metastatic disease. 8. Aortic atherosclerosis.  (ICD10-I70.0) 9.  Emphysema (ICD10-J43.9). These results will be called to the ordering clinician or representative by the Radiologist Assistant, and communication documented in the PACS or [employer]. Electronically Signed   By: Nolon Nations M.D.   On: 08/07/2020 13:20    ASSESSMENT & PLAN:  80 y.o. female with  1.  Newly diagnosed multiple liver lesions concerning for metastases. No overt extrahepatic primary tumor noted on CT chest abdomen pelvis. Possibilities are that the liver lesions are likely neoplastic though other etiologies including fungal infections, abscesses are possible. Within neoplastic etiologies the differential at this point is broad including upper GI, pancreaticobiliary and primary [place] with metastases, breast, melanoma, lymphoma, carcioid. 2.  Failure to thrive with very poor p.o. intake 3.  Severe hypokalemia with muscle weakness. 4. acute kidney injury likely related to dehydration diuretics. 5. 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  6. 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 S/p Lumpectomy,  Radiation, 4 cycles of Cytoxan and Taxotere. Completed maintenance Arimidex until Fall 2019 Continues with annual mammograms with OBGYN Dr. Donalynn Furlong.  PLAN: -Discussed pt labwork today, 08/08/2020; blood counts stable, extremely dehydrated and hypokalemic. -Discussed pt CT C/A/P ([id number]) on 08/07/2020; many mets in the liver that are concerning for tumor. No evidence for an extrahepatic primary tumor. -Advised pt that we are unsure if this primary tumor from the liver or if it is coming from somewhere else. Sometimes we see things without a primary or with a primary that is not noticeable. -Advised pt she is very dry and dehydrated. Her potassium levels are dangerously low currently. Her kidney numbers are also elevated and liver enzymes elevated.  -Advised pt that atypical infections are also on the differential. -Patient is agreeable with going to the emergency room for further evaluation and management. -Will need IV fluids, aggressive potassium replacement. -Would hold diuretics. -Would hold hydroxyurea at this time pending improvement in kidney function back to baseline. -Laxatives to address constipation. -Blood test with tumor markers including AFP tumor marker, CEA, CA 19-9, CA125, chromogranin A, CA 15 3, CA 27-29 and LDH. -IR consultation for CT-guided biopsy of one of the liver lesions for definitive diagnosis. -Okay to hold Xarelto for her necessary biopsy. -Will get outpatient PET/CT to try to find primary tumor and more accurate staging of neoplastic process. -  Symptom management with antinausea meds.  Would consider starting on dexamethasone 4 mg p.o. with breakfast and lunch to help with inflammation in the liver that is causing persistent nausea. -Emphasized importance of finding a way to get the pt to start eating and drinking again. -Advised pt that much of fatigue is due to low potassium levels, dehydration and poor nutritional intake. Will also need to check  magnesium levels and replace to admission level of more than 2.  FOLLOW UP: Referred to the emergency room as noted above Outpatient PET CT scan in 7 to 10 days Return to clinic with Dr. Irene Limbo in 2 weeks with labs   The total time spent in the appointment was 40 minutes and more than 50% was on counseling and direct patient cares.   All of the patient's questions were answered with apparent satisfaction. The patient knows to call the clinic with any problems, questions or concerns.   Sullivan Lone MD [place] AAHIVMS [facility] Hematology/Oncology Physician [facility]  (Office):       [phone] (Work cell):  [phone] (Fax):           [phone]  08/08/2020 11:22 AM  I, Reinaldo Raddle, am acting as scribe for Dr. Sullivan Lone, MD.    .I have reviewed the above documentation for accuracy and completeness, and I agree with the above. Brunetta Genera MD

## 2020-08-07 NOTE — Telephone Encounter (Signed)
Pt contacted per request from PCP to see pt ASAP. Informed pt to come in tomorrow for 11 am appointment (08/08/20) . Pt acknowledged.

## 2020-08-08 ENCOUNTER — Encounter (HOSPITAL_COMMUNITY): Payer: Self-pay | Admitting: Emergency Medicine

## 2020-08-08 ENCOUNTER — Other Ambulatory Visit: Payer: Self-pay

## 2020-08-08 ENCOUNTER — Inpatient Hospital Stay: Payer: Medicare Other | Attending: Hematology

## 2020-08-08 ENCOUNTER — Inpatient Hospital Stay (HOSPITAL_COMMUNITY)
Admission: EM | Admit: 2020-08-08 | Discharge: 2020-08-10 | DRG: 682 | Disposition: A | Discharge status: Home / Self Care | Payer: Medicare Other | Source: Ambulatory Visit | Attending: Family Medicine | Admitting: Family Medicine

## 2020-08-08 ENCOUNTER — Inpatient Hospital Stay (HOSPITAL_BASED_OUTPATIENT_CLINIC_OR_DEPARTMENT_OTHER): Payer: Medicare Other | Admitting: Hematology

## 2020-08-08 VITALS — BP 102/81 | HR 98 | Temp 97.6°F | Resp 18 | Ht 59.25 in | Wt 89.3 lb

## 2020-08-08 DIAGNOSIS — I129 Hypertensive chronic kidney disease with stage 1 through stage 4 chronic kidney disease, or unspecified chronic kidney disease: Secondary | ICD-10-CM | POA: Diagnosis present

## 2020-08-08 DIAGNOSIS — C787 Secondary malignant neoplasm of liver and intrahepatic bile duct: Secondary | ICD-10-CM

## 2020-08-08 DIAGNOSIS — N1832 Chronic kidney disease, stage 3b: Secondary | ICD-10-CM | POA: Diagnosis present

## 2020-08-08 DIAGNOSIS — J45909 Unspecified asthma, uncomplicated: Secondary | ICD-10-CM | POA: Diagnosis present

## 2020-08-08 DIAGNOSIS — R627 Adult failure to thrive: Secondary | ICD-10-CM | POA: Diagnosis present

## 2020-08-08 DIAGNOSIS — Z96642 Presence of left artificial hip joint: Secondary | ICD-10-CM | POA: Diagnosis present

## 2020-08-08 DIAGNOSIS — I2510 Atherosclerotic heart disease of native coronary artery without angina pectoris: Secondary | ICD-10-CM | POA: Diagnosis present

## 2020-08-08 DIAGNOSIS — C7951 Secondary malignant neoplasm of bone: Secondary | ICD-10-CM | POA: Insufficient documentation

## 2020-08-08 DIAGNOSIS — Z79899 Other long term (current) drug therapy: Secondary | ICD-10-CM | POA: Insufficient documentation

## 2020-08-08 DIAGNOSIS — E43 Unspecified severe protein-calorie malnutrition: Secondary | ICD-10-CM | POA: Diagnosis present

## 2020-08-08 DIAGNOSIS — Z880 Allergy status to penicillin: Secondary | ICD-10-CM

## 2020-08-08 DIAGNOSIS — E876 Hypokalemia: Secondary | ICD-10-CM | POA: Diagnosis present

## 2020-08-08 DIAGNOSIS — Z8041 Family history of malignant neoplasm of ovary: Secondary | ICD-10-CM | POA: Insufficient documentation

## 2020-08-08 DIAGNOSIS — M6281 Muscle weakness (generalized): Secondary | ICD-10-CM | POA: Insufficient documentation

## 2020-08-08 DIAGNOSIS — R160 Hepatomegaly, not elsewhere classified: Secondary | ICD-10-CM | POA: Diagnosis present

## 2020-08-08 DIAGNOSIS — R531 Weakness: Secondary | ICD-10-CM

## 2020-08-08 DIAGNOSIS — Z882 Allergy status to sulfonamides status: Secondary | ICD-10-CM

## 2020-08-08 DIAGNOSIS — Z801 Family history of malignant neoplasm of trachea, bronchus and lung: Secondary | ICD-10-CM

## 2020-08-08 DIAGNOSIS — J479 Bronchiectasis, uncomplicated: Secondary | ICD-10-CM | POA: Diagnosis present

## 2020-08-08 DIAGNOSIS — E785 Hyperlipidemia, unspecified: Secondary | ICD-10-CM | POA: Diagnosis present

## 2020-08-08 DIAGNOSIS — I700 Atherosclerosis of aorta: Secondary | ICD-10-CM | POA: Insufficient documentation

## 2020-08-08 DIAGNOSIS — Z5111 Encounter for antineoplastic chemotherapy: Secondary | ICD-10-CM | POA: Insufficient documentation

## 2020-08-08 DIAGNOSIS — E860 Dehydration: Secondary | ICD-10-CM | POA: Diagnosis not present

## 2020-08-08 DIAGNOSIS — Z7901 Long term (current) use of anticoagulants: Secondary | ICD-10-CM

## 2020-08-08 DIAGNOSIS — D6859 Other primary thrombophilia: Secondary | ICD-10-CM | POA: Diagnosis present

## 2020-08-08 DIAGNOSIS — C50919 Malignant neoplasm of unspecified site of unspecified female breast: Secondary | ICD-10-CM | POA: Insufficient documentation

## 2020-08-08 DIAGNOSIS — Z8249 Family history of ischemic heart disease and other diseases of the circulatory system: Secondary | ICD-10-CM

## 2020-08-08 DIAGNOSIS — Z20822 Contact with and (suspected) exposure to covid-19: Secondary | ICD-10-CM | POA: Diagnosis present

## 2020-08-08 DIAGNOSIS — Z87891 Personal history of nicotine dependence: Secondary | ICD-10-CM | POA: Insufficient documentation

## 2020-08-08 DIAGNOSIS — K219 Gastro-esophageal reflux disease without esophagitis: Secondary | ICD-10-CM | POA: Diagnosis present

## 2020-08-08 DIAGNOSIS — Z808 Family history of malignant neoplasm of other organs or systems: Secondary | ICD-10-CM | POA: Insufficient documentation

## 2020-08-08 DIAGNOSIS — K5900 Constipation, unspecified: Secondary | ICD-10-CM | POA: Diagnosis present

## 2020-08-08 DIAGNOSIS — K8020 Calculus of gallbladder without cholecystitis without obstruction: Secondary | ICD-10-CM | POA: Diagnosis present

## 2020-08-08 DIAGNOSIS — Z803 Family history of malignant neoplasm of breast: Secondary | ICD-10-CM | POA: Insufficient documentation

## 2020-08-08 DIAGNOSIS — C229 Malignant neoplasm of liver, not specified as primary or secondary: Secondary | ICD-10-CM

## 2020-08-08 DIAGNOSIS — Z170 Estrogen receptor positive status [ER+]: Secondary | ICD-10-CM | POA: Insufficient documentation

## 2020-08-08 DIAGNOSIS — D473 Essential (hemorrhagic) thrombocythemia: Secondary | ICD-10-CM | POA: Insufficient documentation

## 2020-08-08 DIAGNOSIS — Z681 Body mass index (BMI) 19 or less, adult: Secondary | ICD-10-CM | POA: Diagnosis not present

## 2020-08-08 DIAGNOSIS — C801 Malignant (primary) neoplasm, unspecified: Secondary | ICD-10-CM

## 2020-08-08 DIAGNOSIS — N179 Acute kidney failure, unspecified: Secondary | ICD-10-CM | POA: Diagnosis not present

## 2020-08-08 DIAGNOSIS — Z853 Personal history of malignant neoplasm of breast: Secondary | ICD-10-CM

## 2020-08-08 DIAGNOSIS — F39 Unspecified mood [affective] disorder: Secondary | ICD-10-CM | POA: Diagnosis present

## 2020-08-08 DIAGNOSIS — Z888 Allergy status to other drugs, medicaments and biological substances status: Secondary | ICD-10-CM | POA: Diagnosis not present

## 2020-08-08 DIAGNOSIS — Z8049 Family history of malignant neoplasm of other genital organs: Secondary | ICD-10-CM | POA: Insufficient documentation

## 2020-08-08 LAB — CBC WITH DIFFERENTIAL/PLATELET
Abs Immature Granulocytes: 0.07 10*3/uL (ref 0.00–0.07)
Basophils Absolute: 0.1 10*3/uL (ref 0.0–0.1)
Basophils Relative: 1 %
Eosinophils Absolute: 0.1 10*3/uL (ref 0.0–0.5)
Eosinophils Relative: 1 %
HCT: 47.1 % — ABNORMAL HIGH (ref 36.0–46.0)
Hemoglobin: 14.4 g/dL (ref 12.0–15.0)
Immature Granulocytes: 1 %
Lymphocytes Relative: 8 %
Lymphs Abs: 1 10*3/uL (ref 0.7–4.0)
MCH: 26.8 pg (ref 26.0–34.0)
MCHC: 30.6 g/dL (ref 30.0–36.0)
MCV: 87.5 fL (ref 80.0–100.0)
Monocytes Absolute: 0.6 10*3/uL (ref 0.1–1.0)
Monocytes Relative: 5 %
Neutro Abs: 10.4 10*3/uL — ABNORMAL HIGH (ref 1.7–7.7)
Neutrophils Relative %: 84 %
Platelets: 457 10*3/uL — ABNORMAL HIGH (ref 150–400)
RBC: 5.38 MIL/uL — ABNORMAL HIGH (ref 3.87–5.11)
RDW: 19 % — ABNORMAL HIGH (ref 11.5–15.5)
WBC: 12.2 10*3/uL — ABNORMAL HIGH (ref 4.0–10.5)
nRBC: 0 % (ref 0.0–0.2)

## 2020-08-08 LAB — CMP (CANCER CENTER ONLY)
ALT: 70 U/L — ABNORMAL HIGH (ref 0–44)
AST: 109 U/L — ABNORMAL HIGH (ref 15–41)
Albumin: 3.7 g/dL (ref 3.5–5.0)
Alkaline Phosphatase: 222 U/L — ABNORMAL HIGH (ref 38–126)
Anion gap: 14 (ref 5–15)
BUN: 29 mg/dL — ABNORMAL HIGH (ref 8–23)
CO2: 27 mmol/L (ref 22–32)
Calcium: 10.3 mg/dL (ref 8.9–10.3)
Chloride: 94 mmol/L — ABNORMAL LOW (ref 98–111)
Creatinine: 1.85 mg/dL — ABNORMAL HIGH (ref 0.44–1.00)
GFR, Estimated: 28 mL/min — ABNORMAL LOW (ref >60)
Glucose, Bld: 95 mg/dL (ref 70–99)
Potassium: 2.4 mmol/L — CL (ref 3.5–5.1)
Sodium: 135 mmol/L (ref 135–145)
Total Bilirubin: 1 mg/dL (ref 0.3–1.2)
Total Protein: 7.4 g/dL (ref 6.5–8.1)

## 2020-08-08 LAB — PROTIME-INR
INR: 2.3 — ABNORMAL HIGH (ref 0.8–1.2)
Prothrombin Time: 24.9 seconds — ABNORMAL HIGH (ref 11.4–15.2)

## 2020-08-08 LAB — URINALYSIS, ROUTINE W REFLEX MICROSCOPIC
Bacteria, UA: NONE SEEN
Bilirubin Urine: NEGATIVE
Glucose, UA: NEGATIVE mg/dL
Ketones, ur: NEGATIVE mg/dL
Leukocytes,Ua: NEGATIVE
Nitrite: NEGATIVE
Protein, ur: 100 mg/dL — AB
Specific Gravity, Urine: 1.041 — ABNORMAL HIGH (ref 1.005–1.030)
pH: 5 (ref 5.0–8.0)

## 2020-08-08 LAB — TROPONIN I (HIGH SENSITIVITY)
Troponin I (High Sensitivity): 13 ng/L (ref <18)
Troponin I (High Sensitivity): 29 ng/L — ABNORMAL HIGH (ref <18)

## 2020-08-08 LAB — LIPASE, BLOOD: Lipase: 29 U/L (ref 11–51)

## 2020-08-08 LAB — RESP PANEL BY RT-PCR (FLU A&B, COVID) ARPGX2
Influenza A by PCR: NEGATIVE
Influenza B by PCR: NEGATIVE
SARS Coronavirus 2 by RT PCR: NEGATIVE

## 2020-08-08 LAB — LACTATE DEHYDROGENASE: LDH: 297 U/L — ABNORMAL HIGH (ref 98–192)

## 2020-08-08 LAB — PHOSPHORUS: Phosphorus: 3.8 mg/dL (ref 2.5–4.6)

## 2020-08-08 LAB — BRAIN NATRIURETIC PEPTIDE: B Natriuretic Peptide: 67.3 pg/mL (ref 0.0–100.0)

## 2020-08-08 LAB — PREALBUMIN: Prealbumin: 16.6 mg/dL — ABNORMAL LOW (ref 18–38)

## 2020-08-08 LAB — MAGNESIUM: Magnesium: 2.3 mg/dL (ref 1.7–2.4)

## 2020-08-08 MED ORDER — DIAZEPAM 5 MG PO TABS
5.0000 mg | ORAL_TABLET | Freq: Four times a day (QID) | ORAL | Status: DC | PRN
  Administered 2020-08-09: 5 mg via ORAL
  Filled 2020-08-08: qty 1

## 2020-08-08 MED ORDER — POTASSIUM CHLORIDE 10 MEQ/100ML IV SOLN
10.0000 meq | INTRAVENOUS | Status: AC
  Administered 2020-08-08 (×3): 10 meq via INTRAVENOUS
  Filled 2020-08-08 (×3): qty 100

## 2020-08-08 MED ORDER — LACTATED RINGERS IV SOLN: INTRAVENOUS | Status: DC

## 2020-08-08 MED ORDER — POTASSIUM CHLORIDE CRYS ER 20 MEQ PO TBCR
40.0000 meq | EXTENDED_RELEASE_TABLET | Freq: Two times a day (BID) | ORAL | Status: DC
  Filled 2020-08-08: qty 2

## 2020-08-08 MED ORDER — VITAMIN D3 25 MCG (1000 UNIT) PO TABS
4000.0000 [IU] | ORAL_TABLET | Freq: Every day | ORAL | Status: DC
  Administered 2020-08-09 – 2020-08-10 (×2): 4000 [IU] via ORAL
  Filled 2020-08-08 (×2): qty 4

## 2020-08-08 MED ORDER — POLYETHYLENE GLYCOL 3350 17 G PO PACK
17.0000 g | PACK | Freq: Every day | ORAL | Status: DC
  Filled 2020-08-08 (×2): qty 1

## 2020-08-08 MED ORDER — FOLIC ACID 1 MG PO TABS
1.0000 mg | ORAL_TABLET | Freq: Every day | ORAL | Status: DC
  Administered 2020-08-09 – 2020-08-10 (×2): 1 mg via ORAL
  Filled 2020-08-08 (×2): qty 1

## 2020-08-08 MED ORDER — FLUTICASONE FUROATE-VILANTEROL 200-25 MCG/INH IN AEPB
1.0000 | INHALATION_SPRAY | Freq: Every day | RESPIRATORY_TRACT | Status: DC
  Administered 2020-08-09 – 2020-08-10 (×2): 1 via RESPIRATORY_TRACT
  Filled 2020-08-08: qty 28

## 2020-08-08 MED ORDER — BUPROPION HCL ER (XL) 150 MG PO TB24
150.0000 mg | ORAL_TABLET | Freq: Every morning | ORAL | Status: DC
  Administered 2020-08-09 – 2020-08-10 (×2): 150 mg via ORAL
  Filled 2020-08-08 (×2): qty 1

## 2020-08-08 MED ORDER — BIOTIN 10 MG PO TABS: 1.0000 | ORAL_TABLET | Freq: Every day | ORAL | Status: DC

## 2020-08-08 MED ORDER — ASPIRIN EC 81 MG PO TBEC: 81.0000 mg | DELAYED_RELEASE_TABLET | Freq: Every day | ORAL | Status: DC

## 2020-08-08 MED ORDER — HYOSCYAMINE SULFATE 0.125 MG SL SUBL
0.1250 mg | SUBLINGUAL_TABLET | Freq: Four times a day (QID) | SUBLINGUAL | Status: DC | PRN
  Filled 2020-08-08: qty 1

## 2020-08-08 MED ORDER — PANTOPRAZOLE SODIUM 40 MG PO TBEC
40.0000 mg | DELAYED_RELEASE_TABLET | Freq: Every day | ORAL | Status: DC
  Administered 2020-08-09 – 2020-08-10 (×2): 40 mg via ORAL
  Filled 2020-08-08 (×2): qty 1

## 2020-08-08 MED ORDER — DOCUSATE SODIUM 100 MG PO CAPS
100.0000 mg | ORAL_CAPSULE | Freq: Every day | ORAL | Status: DC
  Administered 2020-08-09 – 2020-08-10 (×2): 100 mg via ORAL
  Filled 2020-08-08 (×2): qty 1

## 2020-08-08 MED ORDER — POTASSIUM CHLORIDE 10 MEQ/100ML IV SOLN
10.0000 meq | INTRAVENOUS | Status: AC
  Administered 2020-08-08 – 2020-08-09 (×4): 10 meq via INTRAVENOUS
  Filled 2020-08-08 (×4): qty 100

## 2020-08-08 MED ORDER — ONDANSETRON HCL 4 MG/2ML IJ SOLN
4.0000 mg | Freq: Four times a day (QID) | INTRAMUSCULAR | Status: DC | PRN
  Administered 2020-08-09 (×2): 4 mg via INTRAVENOUS
  Filled 2020-08-08 (×2): qty 2

## 2020-08-08 MED ORDER — ACETAMINOPHEN 325 MG PO TABS
650.0000 mg | ORAL_TABLET | Freq: Four times a day (QID) | ORAL | Status: DC | PRN
  Administered 2020-08-08 – 2020-08-09 (×3): 650 mg via ORAL
  Filled 2020-08-08 (×3): qty 2

## 2020-08-08 MED ORDER — BOOST / RESOURCE BREEZE PO LIQD CUSTOM: 1.0000 | Freq: Three times a day (TID) | ORAL | Status: DC

## 2020-08-08 MED ORDER — CO Q-10 100 MG PO CAPS: 1.0000 | ORAL_CAPSULE | Freq: Every day | ORAL | Status: DC

## 2020-08-08 MED ORDER — ALBUTEROL SULFATE HFA 108 (90 BASE) MCG/ACT IN AERS
2.0000 | INHALATION_SPRAY | Freq: Four times a day (QID) | RESPIRATORY_TRACT | Status: DC | PRN
  Filled 2020-08-08: qty 6.7

## 2020-08-08 NOTE — H&P (Signed)
History and Physical    [name]:[id number] DOB: Dec 28, 1940 DOA: 08/08/2020  PCP: Gaynelle Arabian, MD  Patient coming from: [facility]  Chief Complaint: generalized weakness  HPI: Gwendolyn Williams is a 80 y.o. female with medical history significant of breast cancer, thrombocytosis, GERD, HLD. Presenting with general weakness. She reports that over the last week, she has had this generalized fatigue and malaise. She has "absolutely no energy". Nothing makes it better. Activity makes it worse. She has had poor appetite w/ nausea for "some time" now. She is unable to quantify for me. She spoke with her PCP about these issues. Labs and imaging were obtained. She was sent to her oncologist d/t the imaging resulted. Labs were obtained in clinic. It was recommended that she come to the ER for evaluation. She denies any other aggravating or alleviating factors.    ED Course: K+ was noted to be 2.4. She was given K+ replacement. Scr was noted to be elevated. She was started on fluids. TRH was called for admission.   Review of Systems:  Denies CP, palpitations, dyspnea, diarrhea, fever, sick contact. Reports N/V, poor appetite, fatigue, unintentional weight loss of "a few pound" (she is unable to quantify). Review of systems is otherwise negative for all not mentioned in HPI.   PMHx Past Medical History:  Diagnosis Date  . Arthritis   . Asthma    controlled with singulair  . Breast cancer ([place])    stage I left breast  . Current use of long term anticoagulation 04/21/2017  . Dyspnea, unspecified 02/24/2017   Started coincident with [place], exertional and orthostatic, normal exam. O2 sat 100%.  . Family history of breast cancer   . GERD (gastroesophageal reflux disease)   . H/O blood clots    clotting disorder since 2010  . Hematoma of arm 08/09/2012  . 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 Hyperlipidemia   . Hypertension   . 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 Pneumonia    viral pneumonia as a kid  . PONV (postoperative nausea and vomiting)     PSHx Past Surgical History:  Procedure Laterality Date  . BREAST SURGERY     Left breast lumpectomy sentinel node biopsy  . CERVICAL SPINE SURGERY  05/23/2008  . COLECTOMY  04/11/2008   left colectomy with end colostomy due to clot  . COLOSTOMY TAKEDOWN  08/14/2008  . EYE SURGERY     bil cataracts  . HERNIA REPAIR  2011   [facility]  . INTRAVASCULAR PRESSURE WIRE/FFR STUDY N/A 01/25/2018   Procedure: INTRAVASCULAR PRESSURE WIRE/FFR STUDY;  Surgeon: Nelva Bush, MD;  Location: [facility];  Service: Cardiovascular;  Laterality: N/A;  . LEFT HEART CATH AND CORONARY ANGIOGRAPHY N/A 01/25/2018   Procedure: LEFT HEART CATH AND CORONARY ANGIOGRAPHY;  Surgeon: Nelva Bush, MD;  Location: [facility];  Service: Cardiovascular;  Laterality: N/A;  . LUMBAR [place] SURGERY  2006  . TOTAL HIP ARTHROPLASTY Left 10/07/2016   Procedure: LEFT TOTAL HIP ARTHROPLASTY ANTERIOR APPROACH;  Surgeon: Paralee Cancel, MD;  Location: [facility];  Service: Orthopedics;  Laterality: Left;  70 mins    SocHx  reports that she quit smoking about 58 years ago. She started smoking about 63 years ago. She has a 5.00 pack-year smoking history. She has never used smokeless tobacco. She reports current alcohol use. She reports that she does  not use drugs.  Allergies  Allergen Reactions  . Lipitor [Atorvastatin Calcium]     Weakness in legs   . Penicillins Hives    Has patient had a PCN reaction causing immediate rash, facial/tongue/throat swelling, SOB or lightheadedness with hypotension: Unknown Has patient had a PCN reaction causing severe rash involving mucus membranes or skin necrosis: Unknown Has patient had a PCN reaction that required hospitalization: No Has patient had a PCN reaction occurring within the last 10 years: No If all of the above  answers are "NO", then may proceed with Cephalosporin use.   . Sulfa Antibiotics Other (See Comments)    unknown  . Tamiflu [Oseltamivir Phosphate] Nausea And Vomiting    FamHx Family History  Problem Relation Age of Onset  . Breast cancer Mother 71  . Lung cancer Mother   . Heart disease Father   . Basal cell carcinoma Father        x2  . CAD Father   . Ovarian cysts Sister   . Dementia Sister   . Breast cancer Maternal Aunt 51  . Breast cancer Paternal Aunt 37  . Ovarian cysts Maternal Grandmother        possibly had ovarian cancer as well  . Breast cancer Maternal Aunt 25  . Cancer Other        either pancreatic or colon  . Other Daughter 5       breast lumpectomy- complex sclerosing lesion  . Polycystic ovary syndrome Daughter   . Bladder Cancer Cousin     Prior to Admission medications   Medication Sig Start Date End Date Taking? Authorizing Provider  acetaminophen (TYLENOL) 500 MG tablet 2 tablets    [provider]  albuterol (VENTOLIN HFA) 108 (90 Base) MCG/ACT inhaler Inhale 2 puffs into the lungs every 6 (six) hours as needed for wheezing or shortness of breath. 05/23/19   Margaretha Seeds, MD  amLODipine (NORVASC) 2.5 MG tablet Take 1 tablet (2.5 mg total) by mouth daily. TO BE TAKEN WITH THE 5 MG TABLET TO EQUAL 7.5 MG DAILY 11/22/19 02/20/20  Skeet Latch, MD  amLODipine (NORVASC) 5 MG tablet Take 5 mg by mouth daily.    [provider]  BAYER ASPIRIN EC LOW DOSE 81 MG EC tablet Take by mouth. 02/02/20   [provider]  Biotin 10 MG TABS 1 tablet    [provider]  Cholecalciferol (VITAMIN D) 2000 units CAPS Take 4,000 Units by mouth daily.     [provider]  Coenzyme Q10 (CO Q-10) 100 MG CAPS 1 capsule with a meal    [provider]  diazepam (VALIUM) 5 MG tablet 1 tablet 04/28/16   [provider]  docusate sodium (COLACE) 100 MG capsule Take 1 capsule (100 mg total) by mouth 2 (two) times  daily. Patient taking differently: Take 100 mg by mouth daily. 10/07/16   Danae Orleans, PA-C  esomeprazole (NEXIUM) 40 MG capsule 1 capsule    [provider]  fluticasone furoate-vilanterol (BREO ELLIPTA) 200-25 MCG/INH AEPB USE 1 INHALATION DAILY 01/23/20   Margaretha Seeds, MD  folic acid (FOLVITE) 1 MG tablet Take 1 mg by mouth daily.    [provider]  hydroxyurea (HYDREA) 500 MG capsule TAKE ONE CAPSULE BY MOUTH DAILY WITH FOOD TO MINIMIZE GI SIDE EFFECTS 07/09/20   Brunetta Genera, MD  hyoscyamine (LEVSIN SL) 0.125 MG SL tablet Take 1 to 3 tablets three times daily as needed 05/07/20  Milus Banister, MD  levothyroxine (SYNTHROID) 25 MCG tablet 1 tablet in the morning on an empty stomach 05/24/20   [provider]  ondansetron (ZOFRAN) 4 MG tablet Take 1 tablet (4 mg total) by mouth every 6 (six) hours as needed for nausea. 10/09/16   Danae Orleans, PA-C  polyethylene glycol (MIRALAX / GLYCOLAX) packet Take 17 g by mouth 2 (two) times daily. Patient taking differently: Take 17 g by mouth daily. 10/07/16   Danae Orleans, PA-C  rivaroxaban (XARELTO) 10 MG TABS tablet Take 1 tablet (10 mg total) by mouth daily. 06/12/20   Brunetta Genera, MD  rosuvastatin (CRESTOR) 40 MG tablet Take 1 tablet (40 mg total) by mouth daily. 11/25/19   Skeet Latch, MD  Spacer/Aero-Holding Josiah Lobo DEVI 1 Device by Does not apply route as directed. 04/15/18   Margaretha Seeds, MD  triamcinolone (KENALOG) 0.1 % 1 application sparingly to affected area 07/05/19   [provider]  triamterene-hydrochlorothiazide (DYAZIDE) 37.5-25 MG capsule Take 1 capsule by mouth daily.    [provider]    Physical Exam: Vitals:   08/08/20 1251  BP: 101/83  Pulse: 84  Resp: 15  Temp: 98.4 F (36.9 C)  TempSrc: Oral  SpO2: 96%    General: 80 y.o. female resting in bed in NAD Eyes: PERRL, normal sclera ENMT: Nares patent w/o discharge, orophaynx clear,  dentition normal, ears w/o discharge/lesions/ulcers Neck: Supple, trachea midline Cardiovascular: RRR, +S1, S2, no m/g/r, equal pulses throughout Respiratory: CTABL, no w/r/r, normal WOB GI: BS+, ND, epigastric/RUQ abdominal pain, no masses noted, no organomegaly noted MSK: No e/c/c Skin: No rashes, bruises, ulcerations noted Neuro: A&O x 3, no focal deficits Psyc: Appropriate interaction and affect, calm/cooperative  Labs on Admission: I have personally reviewed following labs and imaging studies  CBC: Recent Labs  Lab 08/08/20 1052  WBC 12.2*  NEUTROABS 10.4*  HGB 14.4  HCT 47.1*  MCV 87.5  PLT 790*   Basic Metabolic Panel: Recent Labs  Lab 08/08/20 1052  NA 135  K 2.4*  CL 94*  CO2 27  GLUCOSE 95  BUN 29*  CREATININE 1.85*  CALCIUM 10.3   GFR: Estimated Creatinine Clearance: 16.3 mL/min (A) (by C-G formula based on SCr of 1.85 mg/dL (H)). Liver Function Tests: Recent Labs  Lab 08/08/20 1052  AST 109*  ALT 70*  ALKPHOS 222*  BILITOT 1.0  PROT 7.4  ALBUMIN 3.7   No results for input(s): LIPASE, AMYLASE in the last 168 hours. No results for input(s): AMMONIA in the last 168 hours. Coagulation Profile: No results for input(s): INR, PROTIME in the last 168 hours. Cardiac Enzymes: No results for input(s): CKTOTAL, CKMB, CKMBINDEX, TROPONINI in the last 168 hours. BNP (last 3 results) No results for input(s): PROBNP in the last 8760 hours. HbA1C: No results for input(s): HGBA1C in the last 72 hours. CBG: No results for input(s): GLUCAP in the last 168 hours. Lipid Profile: No results for input(s): CHOL, HDL, LDLCALC, TRIG, CHOLHDL, LDLDIRECT in the last 72 hours. Thyroid Function Tests: No results for input(s): TSH, T4TOTAL, FREET4, T3FREE, THYROIDAB in the last 72 hours. Anemia Panel: No results for input(s): VITAMINB12, FOLATE, FERRITIN, TIBC, IRON, RETICCTPCT in the last 72 hours. Urine analysis:    Component Value Date/Time   COLORURINE YELLOW  05/08/2008 2256   APPEARANCEUR CLEAR 05/08/2008 2256   LABSPEC 1.003 (L) 05/08/2008 2256   PHURINE 7.0 05/08/2008 2256   GLUCOSEU NEGATIVE 05/08/2008 2256   HGBUR NEGATIVE 05/08/2008 2256  BILIRUBINUR NEGATIVE 05/08/2008 2256   KETONESUR NEGATIVE 05/08/2008 2256   PROTEINUR NEGATIVE 05/08/2008 2256   UROBILINOGEN 0.2 05/08/2008 2256   NITRITE NEGATIVE 05/08/2008 2256   LEUKOCYTESUR  05/08/2008 2256    NEGATIVE MICROSCOPIC NOT DONE ON URINES WITH NEGATIVE PROTEIN, BLOOD, LEUKOCYTES, NITRITE, OR GLUCOSE <1000 mg/dL.    Radiological Exams on Admission: CT CHEST ABDOMEN PELVIS W CONTRAST  Result Date: 08/07/2020 CLINICAL DATA:  3 pound weight loss over the past few weeks. Abdominal distention and nausea. RIGHT LOWER QUADRANT swelling. History of LEFT breast cancer with lumpectomy. History of colon resection. History of smoking. EXAM: CT CHEST, ABDOMEN, AND PELVIS WITH CONTRAST TECHNIQUE: Multidetector CT imaging of the chest, abdomen and pelvis was performed following the standard protocol during bolus administration of intravenous contrast. CONTRAST:  41mL ISOVUE-300 IOPAMIDOL (ISOVUE-300) INJECTION 61% COMPARISON:  02/12/2018 and 11/20/2016 FINDINGS: CT CHEST FINDINGS Cardiovascular: Heart size is normal. There is atherosclerotic calcification of the arteries. There is atherosclerotic calcification of the thoracic aorta not associated with aneurysm. No pericardial effusion. Pulmonary arteries are normally opacified account for the contrast bolus timing. Mediastinum/Nodes: Esophagus is normal. No mediastinal, hilar, or axillary adenopathy. A 5 millimeter nodule is identified within the LEFT lobe of the thyroid gland. Otherwise the thyroid is normal in appearance. Lungs/Pleura: There are emphysematous changes throughout the lungs. No pulmonary nodules, consolidations, or pleural effusions. There is minimal RIGHT apical scarring. Musculoskeletal: Remote LOWER cervical spine fusion. Minimal degenerative  changes in the midthoracic spine. No acute fracture or subluxation. No suspicious lytic or blastic lesions. CT ABDOMEN PELVIS FINDINGS Hepatobiliary: Liver size is UPPER limits normal. The liver contains innumerable rim enhancing lesions throughout all lobes. Confluent low-attenuation is identified within the anterior aspect of the RIGHT hepatic lobe. Small amount of perihepatic fluid is present. The largest, confluent lesion measures approximately 6.5 x 4.5 centimeters. Other more discrete rounded lesions measure from 6 millimeters to 3.0 centimeters. There is a small amount of pericholecystic fluid. Calcified gallstone measures at least 1.6 centimeters. There is enhancement of the mucosa of the gallbladder. Consider further evaluation with gallbladder ultrasound. Pancreas: Unremarkable. No pancreatic ductal dilatation or surrounding inflammatory changes. Spleen: Normal in size without focal abnormality. Adrenals/Urinary Tract: Adrenal glands are normal in appearance. A 1 centimeter simple cyst is identified in UPPER pole of the LEFT kidney. There is symmetric bilateral renal enhancement and early excretion of contrast. The ureters are unremarkable. Distal aspects of the ureters are partially obscured by a over lying artifact. The urinary bladder is otherwise normal in appearance. Stomach/Bowel: The stomach and small bowel loops are normal in appearance. Small bowel loops are normal in appearance. The appendix is not well seen. Central pelvic small bowel loops are partially obscured by metal artifact in the LEFT hip. Colo-colic anastomosis is identified in the LEFT mid abdomen and is distended with a significant amount of stool. No obstructing mass identified. Vascular/Lymphatic: There is atherosclerotic calcification of the abdominal aorta. Portacaval lymph node is 1.1 centimeters. Unknown along the dorsum of the pancreatic neck is 1.1 centimeters. Although involved by atherosclerosis, there is vascular  opacification of the celiac axis, superior mesenteric artery, and inferior mesenteric artery. Normal appearance of the portal venous system and inferior vena cava. Reproductive: The uterus is present.  No adnexal mass. Other: No free pelvic fluid. Anterior abdominal wall is unremarkable. Musculoskeletal: Degenerative changes are seen in the lumbar spine. No lytic or blastic lesions are noted. Previous LEFT hip arthroplasty. IMPRESSION: 1. Innumerable liver lesions consistent with diffuse metastatic  disease. Confluent lesion in the LEFT hepatic lobe is 6.5 centimeters. 2. Cholelithiasis and enhancement of the gallbladder mucosa along with a small amount of pericholecystic fluid raise the question of gallbladder disease. Consider further evaluation gallbladder ultrasound. 3. Previous colon resection with follow-up pelvic anastomosis in the LEFT mid abdomen. 4. Significant stool burden. 5. Minimally enlarged lymph nodes in the periportal region. 6.  Aortic atherosclerosis.  (ICD10-I70.0) 7. No evidence for osseous metastatic disease. 8. Aortic atherosclerosis.  (ICD10-I70.0) 9.  Emphysema (ICD10-J43.9). These results will be called to the ordering clinician or representative by the Radiologist Assistant, and communication documented in the PACS or [employer]. Electronically Signed   By: Nolon Nations M.D.   On: 08/07/2020 13:20    EKG: Independently reviewed. Sinus, no st elevations  Assessment/Plan Hypokalemia     - admit to inpt, tele     - check Mg2+, replace K+  AKI on CKD3a Dehydration     - AKI is likely multifactorial (dehydration and recent contrast)     - continue fluids     - CT ab/pelvis not suggestive of obstruction  Generalized weakness Unintentional weight loss     - likely secondary to liver mets     - fluids, dietician consult, PT consult     - can try remeron for appetite     - anti-emetics as needed  Liver metastases Elevated LFTs     - CT ab/pelvis as above     -  consult IR for liver biopsy  Cholelithiasis     - noted on CT; rec'd GB US, will order  Hx of essential thrombocytosis Hx of breast cancer     - follows w/ Dr. Irene Limbo; have notified him of admission by secure chat     - ok to hold hydroxyurea and xarelto per his note     - appreciate onco assistance     - resume xarelto after biopsy; start SCDs for now  GERD     - PPI  HLD     - hold statin d/t elevated LFTs  HTN     - BP is low normal now; resume her home regimen as her BP tolerates  Asthma     - continue home regimen  DVT prophylaxis: SCDs  Code Status: FULL  Family Communication: w/ husband at bedside  Consults called: Onco (Dr. Irene Limbo)   Status is: Inpatient  Remains inpatient appropriate because:Inpatient level of care appropriate due to severity of illness   Dispo: The patient is from: Home              Anticipated d/c is to: Home              Patient currently is not medically stable to d/c.   Difficult to place patient No  Jonnie Finner DO [facility]  If 7PM-7AM, please contact night-coverage [email or web address]  08/08/2020, 2:06 PM

## 2020-08-08 NOTE — Progress Notes (Signed)
PHARMACIST - PHYSICIAN ORDER COMMUNICATION  CONCERNING: P&T Medication Policy on Herbal Medications  DESCRIPTION:  This patient's order for:  Biotin and Coenzyme Q10  has been noted.  This product(s) is classified as an "herbal" or natural product. Due to a lack of definitive safety studies or FDA approval, nonstandard manufacturing practices, plus the potential risk of unknown drug-drug interactions while on inpatient medications, the Pharmacy and Therapeutics Committee does not permit the use of "herbal" or natural products of this type within [facility]   ACTION TAKEN: The pharmacy department is unable to verify this order at this time and your patient has been informed of this safety policy. Please reevaluate patient's clinical condition at discharge and address if the herbal or natural product(s) should be resumed at that time.  Leone Haven, PharmD

## 2020-08-08 NOTE — Progress Notes (Signed)
Pt taken from [facility] at 12:30 to ED per Dr Irene Limbo. Pt transported via wheelchair accompanied by husband. Pt taken to room 18 and then assisted to restroom per pt's request. Pt alert and oriented X3. Report given.

## 2020-08-08 NOTE — ED Notes (Signed)
Coming from cancer center-states low K+, lethargy, enlarged liver

## 2020-08-08 NOTE — ED Triage Notes (Signed)
Pt sent by PCP to cancer center, sent for weakness, low K+, failure to thrive. RN from [facility] center states pt has mets to liver and blood was drawn today, recent weight loss

## 2020-08-08 NOTE — ED Provider Notes (Signed)
[facility] DEPT Provider Note   CSN: [id number] Arrival date & time: 08/08/20  1235     History Chief Complaint  Patient presents with  . Weakness  . abnormal labs  . Abnormal Lab    Gwendolyn Williams is a 80 y.o. female.  HPI Patient is sent to the emergency department today from the outpatient oncology center.  She is sent with diagnosis of hypokalemia, dehydration and new metastatic findings in the liver.  Patient reports that she is having a lot of pain in her right abdomen.  She has had significant difficulty eating and drinking.  She reports she has been having some dry heaves but no active vomiting.  No diarrhea.  No pain or swelling in the legs.  No cough or shortness of breath.  No documented fever.    Past Medical History:  Diagnosis Date  . Arthritis   . Asthma    controlled with singulair  . Breast cancer ([place])    stage I left breast  . Current use of long term anticoagulation 04/21/2017  . Dyspnea, unspecified 02/24/2017   Started coincident with [place], exertional and orthostatic, normal exam. O2 sat 100%.  . Family history of breast cancer   . GERD (gastroesophageal reflux disease)   . H/O blood clots    clotting disorder since 2010  . Hematoma of arm 08/09/2012  . 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 Hyperlipidemia   . Hypertension   . 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 Pneumonia    viral pneumonia as a kid  . PONV (postoperative nausea and vomiting)     Patient Active Problem List   Diagnosis Date Noted  . Essential hypertension 04/19/2019  . Bronchiectasis without complication ([place]) [id number]  . Mild reactive airways disease 02/17/2018  . Abnormal cardiac CT angiography   . Genetic testing 06/01/2017  . Current use of long term anticoagulation 04/21/2017  . Family history of breast cancer   . Dyspnea on minimal exertion  02/24/2017  . 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 Hematoma of arm 08/09/2012  . 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 Myeloproliferative disorder ([place]) 01/06/2011  . History of breast cancer in female 01/06/2011    Past Surgical History:  Procedure Laterality Date  . BREAST SURGERY     Left breast lumpectomy sentinel node biopsy  . CERVICAL SPINE SURGERY  05/23/2008  . COLECTOMY  04/11/2008   left colectomy with end colostomy due to clot  . COLOSTOMY TAKEDOWN  08/14/2008  . EYE SURGERY     bil cataracts  . HERNIA REPAIR  2011   [facility]  . INTRAVASCULAR PRESSURE WIRE/FFR STUDY N/A 01/25/2018   Procedure: INTRAVASCULAR PRESSURE WIRE/FFR STUDY;  Surgeon: Nelva Bush, MD;  Location: [facility];  Service: Cardiovascular;  Laterality: N/A;  . LEFT HEART CATH AND CORONARY ANGIOGRAPHY N/A 01/25/2018   Procedure: LEFT HEART CATH AND CORONARY ANGIOGRAPHY;  Surgeon: Nelva Bush, MD;  Location: [facility];  Service: Cardiovascular;  Laterality: N/A;  . LUMBAR [place] SURGERY  2006  . TOTAL HIP ARTHROPLASTY Left 10/07/2016   Procedure: LEFT TOTAL HIP ARTHROPLASTY ANTERIOR APPROACH;  Surgeon: Paralee Cancel, MD;  Location: [facility];  Service: Orthopedics;  Laterality: Left;  70 mins     OB History    Gravida  2   Para  Term      Preterm      AB  0   Living  2     SAB      IAB      Ectopic  0   Multiple      Live Births              Family History  Problem Relation Age of Onset  . Breast cancer Mother 52  . Lung cancer Mother   . Heart disease Father   . Basal cell carcinoma Father        x2  . CAD Father   . Ovarian cysts Sister   . Dementia Sister   . Breast cancer Maternal Aunt 40  . Breast cancer Paternal Aunt 48  . Ovarian cysts Maternal Grandmother        possibly had ovarian cancer as well  . Breast cancer Maternal Aunt 71  . Cancer Other        either pancreatic or colon  . Other  Daughter 63       breast lumpectomy- complex sclerosing lesion  . Polycystic ovary syndrome Daughter   . Bladder Cancer Cousin     Social History   Tobacco Use  . Smoking status: Former Smoker    Packs/day: 1.00    Years: 5.00    Pack years: 5.00    Start date: 64    Quit date: 1964    Years since quitting: 58.3  . Smokeless tobacco: Never Used  Vaping Use  . Vaping Use: Never used  Substance Use Topics  . Alcohol use: Yes    Alcohol/week: 0.0 standard drinks    Comment: Rare  . Drug use: No    Home Medications Prior to Admission medications   Medication Sig Start Date End Date Taking? Authorizing Provider  acetaminophen (TYLENOL) 500 MG tablet 2 tablets    [provider]  albuterol (VENTOLIN HFA) 108 (90 Base) MCG/ACT inhaler Inhale 2 puffs into the lungs every 6 (six) hours as needed for wheezing or shortness of breath. 05/23/19   Margaretha Seeds, MD  amLODipine (NORVASC) 2.5 MG tablet Take 1 tablet (2.5 mg total) by mouth daily. TO BE TAKEN WITH THE 5 MG TABLET TO EQUAL 7.5 MG DAILY 11/22/19 02/20/20  Skeet Latch, MD  amLODipine (NORVASC) 5 MG tablet Take 5 mg by mouth daily.    [provider]  BAYER ASPIRIN EC LOW DOSE 81 MG EC tablet Take by mouth. 02/02/20   [provider]  Biotin 10 MG TABS 1 tablet    [provider]  Cholecalciferol (VITAMIN D) 2000 units CAPS Take 4,000 Units by mouth daily.     [provider]  Coenzyme Q10 (CO Q-10) 100 MG CAPS 1 capsule with a meal    [provider]  diazepam (VALIUM) 5 MG tablet 1 tablet 04/28/16   [provider]  docusate sodium (COLACE) 100 MG capsule Take 1 capsule (100 mg total) by mouth 2 (two) times daily. Patient taking differently: Take 100 mg by mouth daily. 10/07/16   Danae Orleans, PA-C  esomeprazole (NEXIUM) 40 MG capsule 1 capsule    [provider]  fluticasone furoate-vilanterol (BREO ELLIPTA) 200-25 MCG/INH AEPB USE 1 INHALATION  DAILY 01/23/20   Margaretha Seeds, MD  folic acid (FOLVITE) 1 MG tablet Take 1 mg by mouth daily.    [provider]  hydroxyurea (HYDREA) 500 MG capsule TAKE ONE CAPSULE BY MOUTH DAILY WITH FOOD  TO MINIMIZE GI SIDE EFFECTS 07/09/20   Brunetta Genera, MD  hyoscyamine (LEVSIN SL) 0.125 MG SL tablet Take 1 to 3 tablets three times daily as needed 05/07/20   Milus Banister, MD  levothyroxine (SYNTHROID) 25 MCG tablet 1 tablet in the morning on an empty stomach 05/24/20   [provider]  ondansetron (ZOFRAN) 4 MG tablet Take 1 tablet (4 mg total) by mouth every 6 (six) hours as needed for nausea. 10/09/16   Danae Orleans, PA-C  polyethylene glycol (MIRALAX / GLYCOLAX) packet Take 17 g by mouth 2 (two) times daily. Patient taking differently: Take 17 g by mouth daily. 10/07/16   Danae Orleans, PA-C  rivaroxaban (XARELTO) 10 MG TABS tablet Take 1 tablet (10 mg total) by mouth daily. 06/12/20   Brunetta Genera, MD  rosuvastatin (CRESTOR) 40 MG tablet Take 1 tablet (40 mg total) by mouth daily. 11/25/19   Skeet Latch, MD  Spacer/Aero-Holding Josiah Lobo DEVI 1 Device by Does not apply route as directed. 04/15/18   Margaretha Seeds, MD  triamcinolone (KENALOG) 0.1 % 1 application sparingly to affected area 07/05/19   [provider]  triamterene-hydrochlorothiazide (DYAZIDE) 37.5-25 MG capsule Take 1 capsule by mouth daily.    [provider]    Allergies    Lipitor [atorvastatin calcium], Penicillins, Sulfa antibiotics, and Tamiflu [oseltamivir phosphate]  Review of Systems   Review of Systems 10 systems reviewed and negative except as per HPI Physical Exam Updated Vital Signs BP 101/83 (BP Location: Left Arm)   Pulse 84   Temp 98.4 F (36.9 C) (Oral)   Resp 15   SpO2 96%   Physical Exam Constitutional:      Comments: Patient is alert and nontoxic.  Mental status clear.  No respiratory distress at rest.  Thin and fatigued in appearance.  HENT:      Mouth/Throat:     Mouth: Mucous membranes are moist.     Pharynx: Oropharynx is clear.  Eyes:     Extraocular Movements: Extraocular movements intact.     Conjunctiva/sclera: Conjunctivae normal.  Cardiovascular:     Rate and Rhythm: Normal rate and regular rhythm.  Pulmonary:     Effort: Pulmonary effort is normal.     Breath sounds: Normal breath sounds.  Abdominal:     Comments: Abdomen tender to palpation right upper quadrant.  No guarding.  Palpable mass in the right upper quadrant.  Musculoskeletal:        General: No swelling or tenderness. Normal range of motion.     Right lower leg: No edema.     Left lower leg: No edema.  Skin:    General: Skin is warm and dry.     Coloration: Skin is pale.  Neurological:     General: No focal deficit present.     Mental Status: She is oriented to person, place, and time.     Cranial Nerves: No cranial nerve deficit.     Coordination: Coordination normal.  Psychiatric:        Mood and Affect: Mood normal.     ED Results / Procedures / Treatments   Labs (all labs ordered are listed, but only abnormal results are displayed) Labs Reviewed  LIPASE, BLOOD  BRAIN NATRIURETIC PEPTIDE  PROTIME-INR  URINALYSIS, ROUTINE W REFLEX MICROSCOPIC  MAGNESIUM  PHOSPHORUS  AFP TUMOR MARKER  CEA  LACTATE DEHYDROGENASE  CANCER ANTIGEN 19-9  CA 125  CANCER ANTIGEN 15-3  CANCER ANTIGEN 27.29  CHROMOGRANIN A  PREALBUMIN  TROPONIN I (HIGH SENSITIVITY)    EKG EKG Interpretation  Date/Time:  Wednesday Aug 08 2020 12:55:38 EDT Ventricular Rate:  82 PR Interval:  155 QRS Duration: 89 QT Interval:  395 QTC Calculation: 462 R Axis:   -55 Text Interpretation: Sinus rhythm Left anterior fascicular block Baseline wander in lead(s) III aVL no sig change from previous Confirmed by Charlesetta Shanks [phone]) on 08/08/2020 1:53:32 PM   Radiology CT CHEST ABDOMEN PELVIS W CONTRAST  Result Date: 08/07/2020 CLINICAL DATA:  3 pound weight loss  over the past few weeks. Abdominal distention and nausea. RIGHT LOWER QUADRANT swelling. History of LEFT breast cancer with lumpectomy. History of colon resection. History of smoking. EXAM: CT CHEST, ABDOMEN, AND PELVIS WITH CONTRAST TECHNIQUE: Multidetector CT imaging of the chest, abdomen and pelvis was performed following the standard protocol during bolus administration of intravenous contrast. CONTRAST:  15mL ISOVUE-300 IOPAMIDOL (ISOVUE-300) INJECTION 61% COMPARISON:  02/12/2018 and 11/20/2016 FINDINGS: CT CHEST FINDINGS Cardiovascular: Heart size is normal. There is atherosclerotic calcification of the arteries. There is atherosclerotic calcification of the thoracic aorta not associated with aneurysm. No pericardial effusion. Pulmonary arteries are normally opacified account for the contrast bolus timing. Mediastinum/Nodes: Esophagus is normal. No mediastinal, hilar, or axillary adenopathy. A 5 millimeter nodule is identified within the LEFT lobe of the thyroid gland. Otherwise the thyroid is normal in appearance. Lungs/Pleura: There are emphysematous changes throughout the lungs. No pulmonary nodules, consolidations, or pleural effusions. There is minimal RIGHT apical scarring. Musculoskeletal: Remote LOWER cervical spine fusion. Minimal degenerative changes in the midthoracic spine. No acute fracture or subluxation. No suspicious lytic or blastic lesions. CT ABDOMEN PELVIS FINDINGS Hepatobiliary: Liver size is UPPER limits normal. The liver contains innumerable rim enhancing lesions throughout all lobes. Confluent low-attenuation is identified within the anterior aspect of the RIGHT hepatic lobe. Small amount of perihepatic fluid is present. The largest, confluent lesion measures approximately 6.5 x 4.5 centimeters. Other more discrete rounded lesions measure from 6 millimeters to 3.0 centimeters. There is a small amount of pericholecystic fluid. Calcified gallstone measures at least 1.6 centimeters. There  is enhancement of the mucosa of the gallbladder. Consider further evaluation with gallbladder ultrasound. Pancreas: Unremarkable. No pancreatic ductal dilatation or surrounding inflammatory changes. Spleen: Normal in size without focal abnormality. Adrenals/Urinary Tract: Adrenal glands are normal in appearance. A 1 centimeter simple cyst is identified in UPPER pole of the LEFT kidney. There is symmetric bilateral renal enhancement and early excretion of contrast. The ureters are unremarkable. Distal aspects of the ureters are partially obscured by a over lying artifact. The urinary bladder is otherwise normal in appearance. Stomach/Bowel: The stomach and small bowel loops are normal in appearance. Small bowel loops are normal in appearance. The appendix is not well seen. Central pelvic small bowel loops are partially obscured by metal artifact in the LEFT hip. Colo-colic anastomosis is identified in the LEFT mid abdomen and is distended with a significant amount of stool. No obstructing mass identified. Vascular/Lymphatic: There is atherosclerotic calcification of the abdominal aorta. Portacaval lymph node is 1.1 centimeters. Unknown along the dorsum of the pancreatic neck is 1.1 centimeters. Although involved by atherosclerosis, there is vascular opacification of the celiac axis, superior mesenteric artery, and inferior mesenteric artery. Normal appearance of the portal venous system and inferior vena cava. Reproductive: The uterus is present.  No adnexal mass. Other: No free pelvic fluid. Anterior abdominal wall is unremarkable. Musculoskeletal: Degenerative changes are seen in the lumbar spine. No lytic or blastic lesions  are noted. Previous LEFT hip arthroplasty. IMPRESSION: 1. Innumerable liver lesions consistent with diffuse metastatic disease. Confluent lesion in the LEFT hepatic lobe is 6.5 centimeters. 2. Cholelithiasis and enhancement of the gallbladder mucosa along with a small amount of pericholecystic  fluid raise the question of gallbladder disease. Consider further evaluation gallbladder ultrasound. 3. Previous colon resection with follow-up pelvic anastomosis in the LEFT mid abdomen. 4. Significant stool burden. 5. Minimally enlarged lymph nodes in the periportal region. 6.  Aortic atherosclerosis.  (ICD10-I70.0) 7. No evidence for osseous metastatic disease. 8. Aortic atherosclerosis.  (ICD10-I70.0) 9.  Emphysema (ICD10-J43.9). These results will be called to the ordering clinician or representative by the Radiologist Assistant, and communication documented in the PACS or [employer]. Electronically Signed   By: Nolon Nations M.D.   On: 08/07/2020 13:20    Procedures Procedures  CRITICAL CARE Performed by: Charlesetta Shanks   Total critical care time: 30 minutes  Critical care time was exclusive of separately billable procedures and treating other patients.  Critical care was necessary to treat or prevent imminent or life-threatening deterioration.  Critical care was time spent personally by me on the following activities: development of treatment plan with patient and/or surrogate as well as nursing, discussions with consultants, evaluation of patient's response to treatment, examination of patient, obtaining history from patient or surrogate, ordering and performing treatments and interventions, ordering and review of laboratory studies, ordering and review of radiographic studies, pulse oximetry and re-evaluation of patient's condition. Medications Ordered in ED Medications  lactated ringers infusion ( Intravenous New Bag/Given 08/08/20 1359)  potassium chloride 10 mEq in 100 mL IVPB (10 mEq Intravenous New Bag/Given 08/08/20 1358)    ED Course  I have reviewed the triage vital signs and the nursing notes.  Pertinent labs & imaging results that were available during my care of the patient were reviewed by me and considered in my medical decision making (see chart for  details).    MDM Rules/Calculators/A&P                          Patient presents as outlined.  She has been having incremental weakness, loss of appetite, pain with eating and dry heaves.  Patient had CT scan done yesterday that was reviewed today in the oncology office.  It appears that she has multiple metastatic lesions in the liver which is consistent with her pain presentation.  Patient also has hypokalemia with significant general weakness.  She has not had a syncopal episode.  At this time, plan will be for admission for rehydration, potassium replacement and further diagnostic evaluation for what appears to be a metastatic liver lesions. Final Clinical Impression(s) / ED Diagnoses Final diagnoses:  Hypokalemia  Weakness  Dehydration  Malignant neoplasm of liver, unspecified liver malignancy type [facility])    Rx / DC Orders ED Discharge Orders    None       Charlesetta Shanks, MD 08/08/20 [phone]

## 2020-08-09 ENCOUNTER — Inpatient Hospital Stay (HOSPITAL_COMMUNITY): Payer: Medicare Other

## 2020-08-09 DIAGNOSIS — E43 Unspecified severe protein-calorie malnutrition: Secondary | ICD-10-CM | POA: Diagnosis not present

## 2020-08-09 DIAGNOSIS — C787 Secondary malignant neoplasm of liver and intrahepatic bile duct: Secondary | ICD-10-CM | POA: Diagnosis not present

## 2020-08-09 DIAGNOSIS — E876 Hypokalemia: Secondary | ICD-10-CM | POA: Diagnosis not present

## 2020-08-09 DIAGNOSIS — C801 Malignant (primary) neoplasm, unspecified: Secondary | ICD-10-CM | POA: Diagnosis not present

## 2020-08-09 LAB — COMPREHENSIVE METABOLIC PANEL
ALT: 47 U/L — ABNORMAL HIGH (ref 0–44)
AST: 81 U/L — ABNORMAL HIGH (ref 15–41)
Albumin: 3 g/dL — ABNORMAL LOW (ref 3.5–5.0)
Alkaline Phosphatase: 125 U/L (ref 38–126)
Anion gap: 8 (ref 5–15)
BUN: 24 mg/dL — ABNORMAL HIGH (ref 8–23)
CO2: 22 mmol/L (ref 22–32)
Calcium: 8.9 mg/dL (ref 8.9–10.3)
Chloride: 105 mmol/L (ref 98–111)
Creatinine, Ser: 1.53 mg/dL — ABNORMAL HIGH (ref 0.44–1.00)
GFR, Estimated: 35 mL/min — ABNORMAL LOW (ref >60)
Glucose, Bld: 67 mg/dL — ABNORMAL LOW (ref 70–99)
Potassium: 3.5 mmol/L (ref 3.5–5.1)
Sodium: 135 mmol/L (ref 135–145)
Total Bilirubin: 1.2 mg/dL (ref 0.3–1.2)
Total Protein: 5.6 g/dL — ABNORMAL LOW (ref 6.5–8.1)

## 2020-08-09 LAB — CBC
HCT: 38.8 % (ref 36.0–46.0)
Hemoglobin: 11.7 g/dL — ABNORMAL LOW (ref 12.0–15.0)
MCH: 27.3 pg (ref 26.0–34.0)
MCHC: 30.2 g/dL (ref 30.0–36.0)
MCV: 90.4 fL (ref 80.0–100.0)
Platelets: 263 10*3/uL (ref 150–400)
RBC: 4.29 MIL/uL (ref 3.87–5.11)
RDW: 18.8 % — ABNORMAL HIGH (ref 11.5–15.5)
WBC: 8.2 10*3/uL (ref 4.0–10.5)
nRBC: 0 % (ref 0.0–0.2)

## 2020-08-09 LAB — PROTIME-INR
INR: 1.3 — ABNORMAL HIGH (ref 0.8–1.2)
Prothrombin Time: 15.9 seconds — ABNORMAL HIGH (ref 11.4–15.2)

## 2020-08-09 LAB — CANCER ANTIGEN 15-3: CA 15-3: 1765 U/mL — ABNORMAL HIGH (ref 0.0–25.0)

## 2020-08-09 LAB — CANCER ANTIGEN 19-9: CA 19-9: 37 U/mL — ABNORMAL HIGH (ref 0–35)

## 2020-08-09 LAB — AFP TUMOR MARKER: AFP, Serum, Tumor Marker: 4.3 ng/mL (ref 0.0–9.2)

## 2020-08-09 LAB — CEA: CEA: 8.6 ng/mL — ABNORMAL HIGH (ref 0.0–4.7)

## 2020-08-09 LAB — CANCER ANTIGEN 27.29: CA 27.29: 2142.3 U/mL — ABNORMAL HIGH (ref 0.0–38.6)

## 2020-08-09 LAB — CA 125: Cancer Antigen (CA) 125: 31.2 U/mL (ref 0.0–38.1)

## 2020-08-09 MED ORDER — RESOURCE INSTANT PROTEIN PO PWD PACKET
1.0000 | Freq: Three times a day (TID) | ORAL | Status: DC
  Administered 2020-08-09: 6 g via ORAL
  Filled 2020-08-09 (×4): qty 6

## 2020-08-09 MED ORDER — POTASSIUM CHLORIDE CRYS ER 10 MEQ PO TBCR
10.0000 meq | EXTENDED_RELEASE_TABLET | Freq: Once | ORAL | Status: AC
  Administered 2020-08-09: 10 meq via ORAL
  Filled 2020-08-09: qty 1

## 2020-08-09 MED ORDER — RIVAROXABAN 10 MG PO TABS
10.0000 mg | ORAL_TABLET | Freq: Every day | ORAL | Status: DC
  Administered 2020-08-09: 10 mg via ORAL
  Filled 2020-08-09: qty 1

## 2020-08-09 MED ORDER — HYDROXYUREA 500 MG PO CAPS
500.0000 mg | ORAL_CAPSULE | Freq: Every day | ORAL | Status: DC
  Administered 2020-08-10: 500 mg via ORAL
  Filled 2020-08-09: qty 1

## 2020-08-09 MED ORDER — ENSURE ENLIVE PO LIQD
237.0000 mL | Freq: Two times a day (BID) | ORAL | Status: DC
  Administered 2020-08-09: 237 mL via ORAL

## 2020-08-09 MED ORDER — KCL-LACTATED RINGERS-D5W 20 MEQ/L IV SOLN
INTRAVENOUS | Status: DC
  Filled 2020-08-09: qty 1000

## 2020-08-09 MED ORDER — POTASSIUM CHLORIDE 2 MEQ/ML IV SOLN
INTRAVENOUS | Status: DC
  Filled 2020-08-09 (×2): qty 1000

## 2020-08-09 MED ORDER — POTASSIUM CHLORIDE 20 MEQ PO PACK
20.0000 meq | PACK | Freq: Once | ORAL | Status: AC
  Administered 2020-08-09: 20 meq via ORAL
  Filled 2020-08-09: qty 1

## 2020-08-09 MED ORDER — DEXAMETHASONE 4 MG PO TABS
4.0000 mg | ORAL_TABLET | Freq: Two times a day (BID) | ORAL | Status: DC
  Administered 2020-08-09 – 2020-08-10 (×2): 4 mg via ORAL
  Filled 2020-08-09 (×2): qty 1

## 2020-08-09 NOTE — Progress Notes (Signed)
Initial Nutrition Assessment  DOCUMENTATION CODES:   Severe malnutrition in context of chronic illness,Underweight  INTERVENTION:   -Ensure Enlive po BID, each supplement provides 350 kcal and 20 grams of protein  -Magic cup TID with meals, each supplement provides 290 kcal and 9 grams of protein  -Beneprotein powder TID with meals, each provides 25 kcals and 6g protein  NUTRITION DIAGNOSIS:   Severe Malnutrition related to chronic illness,cancer and cancer related treatments as evidenced by energy intake < or equal to 75% for > or equal to 1 month,moderate fat depletion,severe muscle depletion.  GOAL:   Patient will meet greater than or equal to 90% of their needs  MONITOR:   PO intake,Supplement acceptance,Labs,Weight trends,I & O's  REASON FOR ASSESSMENT:   Consult Assessment of nutrition requirement/status  ASSESSMENT:   80 y.o. female with medical history significant of breast cancer, thrombocytosis, GERD, HLD. Presenting with general weakness. She reports that over the last week, she has had this generalized fatigue and malaise.  Patient in room with husband at bedside. Pt reports poor appetite for months. States it has gradually decreased over time, stating she gets full very quickly now. Tried to eat breakfast this morning and only consumed fruit and bites of coffee cake. Pt states she does not like Ensure and is a "picky liquid" person. Will let pt try Ensure and order Beneprotein and Magic cups as well. Pt inquired about "IV nutrition". Explained that we should focus on PO intake for right now.  Per patient her UBW is ~90 lbs. Started noticing her weight dropping at home, lowest was 86 lbs. Current weight: 89 lbs.  Medications: Vitamin D, Colace, folic acid, Miralax, KLOR-CON, KCl, IV Zofran  Labs reviewed.  NUTRITION - FOCUSED PHYSICAL EXAM:  Flowsheet Row Most Recent Value  Orbital Region Moderate depletion  Upper Arm Region Severe depletion  Thoracic  and Lumbar Region Unable to assess  Buccal Region Moderate depletion  Temple Region Severe depletion  Clavicle Bone Region Severe depletion  Clavicle and Acromion Bone Region Severe depletion  Scapular Bone Region Severe depletion  Dorsal Hand Severe depletion  Patellar Region Moderate depletion  Anterior Thigh Region Moderate depletion  Posterior Calf Region Moderate depletion  Edema (RD Assessment) None       Diet Order:   Diet Order            Diet regular Room service appropriate? Yes; Fluid consistency: Thin  Diet effective now                 EDUCATION NEEDS:   Education needs have been addressed  Skin:  Skin Assessment: Reviewed RN Assessment  Last BM:  5/10  Height:   Ht Readings from Last 1 Encounters:  08/08/20 4' 11.25" (1.505 m)    Weight:   Wt Readings from Last 1 Encounters:  08/08/20 40.5 kg    BMI:  18 kg/m^2  Estimated Nutritional Needs:   Kcal:  1450-1650  Protein:  65-80g  Fluid:  1.6L/day   Clayton Bibles, MS, RD, LDN Inpatient Clinical Dietitian Contact information available via [employer]

## 2020-08-09 NOTE — Progress Notes (Signed)
HEMATOLOGY/ONCOLOGY INPATIENT PROGRESS NOTE  Date of Service: 08/09/2020  Inpatient Attending: .Edwin Dada, *   SUBJECTIVE  Gwendolyn Williams notes that she has been improving slowly. She is joined bedside by her husband. The pt notes that she still feels very bloated and full after eating very little amounts. The pt notes much desire to get the biopsy as soon as possible and wants a solid date for peace of mind. The pt notes that she received a mammogram last year at [place]. She notes a small bowel movement late night/early morning. The pt notes much appreciation with her care thus far. The pt notes continued distention but pain only when touched. Mild headaches improved with Tylenol. The pt notes no metal in her body. She is able to eat very little amounts at this time.  Labwork today 08/09/2020 of CBC and CMP WNL except Glucose of 67, BUN of 24, Creatinine of 1.53, Total Protein of 5.6, Albumin of 3.0, AST of 81, ALT of 47. GFR est of 35, Hgb of 11.7, RDW of 18.8.  Tumor markers 08/08/2020: Troponin I of 29. CEA of 8.6. AFP tumor marker of 4.3. Prealbumin of 16.6. B Natriuretic Peptide of 67.3. CA 27.29 of 2142.3. CA 15-3 of 1765.0. CA 125 of 31.2. CA 19-9 of 37. Chromogranin A in progress.  OBJECTIVE:  NAD. The pt is alert and in no acute distress.    PHYSICAL EXAMINATION: . Vitals:   08/08/20 1923 08/08/20 2322 08/09/20 0322 08/09/20 0816  BP: 128/86 127/79 119/72   Pulse: 73 64 (!) 58   Resp: _0 Temp: 98.6 F (37 C) 97.8 F (36.6 C) 97.9 F (36.6 C)   TempSrc: Oral Oral Oral   SpO2: 98% 100% 100% 100%   There were no vitals filed for this visit. .There is no height or weight on file to calculate BMI.   Exam was given in pt's inpatient bed.   GENERAL:alert, in no acute distress and comfortable SKIN: skin color, texture, turgor are normal, no rashes or significant lesions EYES: normal, conjunctiva are pink and non-injected, sclera  clear OROPHARYNX:no exudate, no erythema and lips, buccal mucosa, and tongue normal  NECK: supple, no JVD, thyroid normal size, non-tender, without nodularity LYMPH:  no palpable lymphadenopathy in the cervical, axillary or inguinal LUNGS: clear to auscultation with normal respiratory effort HEART: regular rate & rhythm,  no murmurs and no lower extremity edema ABDOMEN: abdomen soft, TTP over rt upper quadrant, no guarding or rigidity Musculoskeletal: no cyanosis of digits and no clubbing  PSYCH: alert & oriented x 3 with fluent speech NEURO: no focal motor/sensory deficits  MEDICAL HISTORY:  Past Medical History:  Diagnosis Date  . Arthritis   . Asthma    controlled with singulair  . Breast cancer ([place])    stage I left breast  . Current use of long term anticoagulation 04/21/2017  . Dyspnea, unspecified 02/24/2017   Started coincident with [place], exertional and orthostatic, normal exam. O2 sat 100%.  . Family history of breast cancer   . GERD (gastroesophageal reflux disease)   . H/O blood clots    clotting disorder since 2010  . Hematoma of arm 08/09/2012  . 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 Hyperlipidemia   . Hypertension   . 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 Pneumonia    viral pneumonia as a kid  .  PONV (postoperative nausea and vomiting)     SURGICAL HISTORY: Past Surgical History:  Procedure Laterality Date  . BREAST SURGERY     Left breast lumpectomy sentinel node biopsy  . CERVICAL SPINE SURGERY  05/23/2008  . COLECTOMY  04/11/2008   left colectomy with end colostomy due to clot  . COLOSTOMY TAKEDOWN  08/14/2008  . EYE SURGERY     bil cataracts  . HERNIA REPAIR  2011   [facility]  . INTRAVASCULAR PRESSURE WIRE/FFR STUDY N/A 01/25/2018   Procedure: INTRAVASCULAR PRESSURE WIRE/FFR STUDY;  Surgeon: Nelva Bush, MD;  Location: [facility];  Service: Cardiovascular;   Laterality: N/A;  . LEFT HEART CATH AND CORONARY ANGIOGRAPHY N/A 01/25/2018   Procedure: LEFT HEART CATH AND CORONARY ANGIOGRAPHY;  Surgeon: Nelva Bush, MD;  Location: [facility];  Service: Cardiovascular;  Laterality: N/A;  . LUMBAR [place] SURGERY  2006  . TOTAL HIP ARTHROPLASTY Left 10/07/2016   Procedure: LEFT TOTAL HIP ARTHROPLASTY ANTERIOR APPROACH;  Surgeon: Paralee Cancel, MD;  Location: [facility];  Service: Orthopedics;  Laterality: Left;  70 mins    SOCIAL HISTORY: Social History   Socioeconomic History  . Marital status: Married    Spouse name: Not on file  . Number of children: Not on file  . Years of education: Not on file  . Highest education level: Not on file  Occupational History  . Not on file  Tobacco Use  . Smoking status: Former Smoker    Packs/day: 1.00    Years: 5.00    Pack years: 5.00    Start date: 63    Quit date: 1964    Years since quitting: 58.4  . Smokeless tobacco: Never Used  Vaping Use  . Vaping Use: Never used  Substance and Sexual Activity  . Alcohol use: Yes    Alcohol/week: 0.0 standard drinks    Comment: Rare  . Drug use: No  . Sexual activity: Not Currently    Partners: Male    Comment: 1st intercourse 80 yo-Fewer than 5 partners  Other Topics Concern  . Not on file  Social History Narrative  . Not on file   Social Determinants of Health   Financial Resource Strain: Not on file  Food Insecurity: Not on file  Transportation Needs: Not on file  Physical Activity: Not on file  Stress: Not on file  Social Connections: Not on file  Intimate Partner Violence: Not on file    FAMILY HISTORY: Family History  Problem Relation Age of Onset  . Breast cancer Mother 48  . Lung cancer Mother   . Heart disease Father   . Basal cell carcinoma Father        x2  . CAD Father   . Ovarian cysts Sister   . Dementia Sister   . Breast cancer Maternal Aunt 19  . Breast cancer Paternal Aunt 45  . Ovarian cysts Maternal  Grandmother        possibly had ovarian cancer as well  . Breast cancer Maternal Aunt 58  . Cancer Other        either pancreatic or colon  . Other Daughter 51       breast lumpectomy- complex sclerosing lesion  . Polycystic ovary syndrome Daughter   . Bladder Cancer Cousin     ALLERGIES:  is allergic to lipitor [atorvastatin calcium], penicillins, sulfa antibiotics, and tamiflu [oseltamivir phosphate].  MEDICATIONS:  Scheduled Meds: . buPROPion  150 mg Oral q morning  .  cholecalciferol  4,000 Units Oral Daily  . docusate sodium  100 mg Oral Daily  . feeding supplement  1 Container Oral TID BM  . fluticasone furoate-vilanterol  1 puff Inhalation Daily  . folic acid  1 mg Oral Daily  . pantoprazole  40 mg Oral Daily  . polyethylene glycol  17 g Oral Daily   Continuous Infusions: PRN Meds:.acetaminophen, albuterol, diazepam, hyoscyamine, ondansetron (ZOFRAN) IV  REVIEW OF SYSTEMS:   10 Point review of Systems was done is negative except as noted above.   LABORATORY DATA:  I have reviewed the data as listed  . CBC Latest Ref Rng & Units 08/09/2020 08/08/2020 06/12/2020  WBC 4.0 - 10.5 K/uL 8.2 12.2(H) 8.9  Hemoglobin 12.0 - 15.0 g/dL 11.7(L) 14.4 12.1  Hematocrit 36.0 - 46.0 % 38.8 47.1(H) 40.2  Platelets 150 - 400 K/uL 263 457(H) 358    . CMP Latest Ref Rng & Units 08/09/2020 08/08/2020 06/12/2020  Glucose 70 - 99 mg/dL 67(L) 95 85  BUN 8 - 23 mg/dL 24(H) 29(H) 18  Creatinine 0.44 - 1.00 mg/dL 1.53(H) 1.85(H) 1.03(H)  Sodium 135 - 145 mmol/L 135 135 139  Potassium 3.5 - 5.1 mmol/L 3.5 2.4(LL) 3.7  Chloride 98 - 111 mmol/L 105 94(L) 105  CO2 22 - 32 mmol/L _0 Calcium 8.9 - 10.3 mg/dL 8.9 10.3 8.9  Total Protein 6.5 - 8.1 g/dL 5.6(L) 7.4 6.6  Total Bilirubin 0.3 - 1.2 mg/dL 1.2 1.0 0.6  Alkaline Phos 38 - 126 U/L 125 222(H) 87  AST 15 - 41 U/L 81(H) 109(H) 53(H)  ALT 0 - 44 U/L 47(H) 70(H) 37     RADIOGRAPHIC STUDIES: I have personally reviewed the  radiological images as listed and agreed with the findings in the report. CT CHEST ABDOMEN PELVIS W CONTRAST  Result Date: 08/07/2020 CLINICAL DATA:  3 pound weight loss over the past few weeks. Abdominal distention and nausea. RIGHT LOWER QUADRANT swelling. History of LEFT breast cancer with lumpectomy. History of colon resection. History of smoking. EXAM: CT CHEST, ABDOMEN, AND PELVIS WITH CONTRAST TECHNIQUE: Multidetector CT imaging of the chest, abdomen and pelvis was performed following the standard protocol during bolus administration of intravenous contrast. CONTRAST:  33m ISOVUE-300 IOPAMIDOL (ISOVUE-300) INJECTION 61% COMPARISON:  02/12/2018 and 11/20/2016 FINDINGS: CT CHEST FINDINGS Cardiovascular: Heart size is normal. There is atherosclerotic calcification of the arteries. There is atherosclerotic calcification of the thoracic aorta not associated with aneurysm. No pericardial effusion. Pulmonary arteries are normally opacified account for the contrast bolus timing. Mediastinum/Nodes: Esophagus is normal. No mediastinal, hilar, or axillary adenopathy. A 5 millimeter nodule is identified within the LEFT lobe of the thyroid gland. Otherwise the thyroid is normal in appearance. Lungs/Pleura: There are emphysematous changes throughout the lungs. No pulmonary nodules, consolidations, or pleural effusions. There is minimal RIGHT apical scarring. Musculoskeletal: Remote LOWER cervical spine fusion. Minimal degenerative changes in the midthoracic spine. No acute fracture or subluxation. No suspicious lytic or blastic lesions. CT ABDOMEN PELVIS FINDINGS Hepatobiliary: Liver size is UPPER limits normal. The liver contains innumerable rim enhancing lesions throughout all lobes. Confluent low-attenuation is identified within the anterior aspect of the RIGHT hepatic lobe. Small amount of perihepatic fluid is present. The largest, confluent lesion measures approximately 6.5 x 4.5 centimeters. Other more discrete  rounded lesions measure from 6 millimeters to 3.0 centimeters. There is a small amount of pericholecystic fluid. Calcified gallstone measures at least 1.6 centimeters. There is enhancement of the mucosa of the gallbladder.  Consider further evaluation with gallbladder ultrasound. Pancreas: Unremarkable. No pancreatic ductal dilatation or surrounding inflammatory changes. Spleen: Normal in size without focal abnormality. Adrenals/Urinary Tract: Adrenal glands are normal in appearance. A 1 centimeter simple cyst is identified in UPPER pole of the LEFT kidney. There is symmetric bilateral renal enhancement and early excretion of contrast. The ureters are unremarkable. Distal aspects of the ureters are partially obscured by a over lying artifact. The urinary bladder is otherwise normal in appearance. Stomach/Bowel: The stomach and small bowel loops are normal in appearance. Small bowel loops are normal in appearance. The appendix is not well seen. Central pelvic small bowel loops are partially obscured by metal artifact in the LEFT hip. Colo-colic anastomosis is identified in the LEFT mid abdomen and is distended with a significant amount of stool. No obstructing mass identified. Vascular/Lymphatic: There is atherosclerotic calcification of the abdominal aorta. Portacaval lymph node is 1.1 centimeters. Unknown along the dorsum of the pancreatic neck is 1.1 centimeters. Although involved by atherosclerosis, there is vascular opacification of the celiac axis, superior mesenteric artery, and inferior mesenteric artery. Normal appearance of the portal venous system and inferior vena cava. Reproductive: The uterus is present.  No adnexal mass. Other: No free pelvic fluid. Anterior abdominal wall is unremarkable. Musculoskeletal: Degenerative changes are seen in the lumbar spine. No lytic or blastic lesions are noted. Previous LEFT hip arthroplasty. IMPRESSION: 1. Innumerable liver lesions consistent with diffuse metastatic  disease. Confluent lesion in the LEFT hepatic lobe is 6.5 centimeters. 2. Cholelithiasis and enhancement of the gallbladder mucosa along with a small amount of pericholecystic fluid raise the question of gallbladder disease. Consider further evaluation gallbladder ultrasound. 3. Previous colon resection with follow-up pelvic anastomosis in the LEFT mid abdomen. 4. Significant stool burden. 5. Minimally enlarged lymph nodes in the periportal region. 6.  Aortic atherosclerosis.  (ICD10-I70.0) 7. No evidence for osseous metastatic disease. 8. Aortic atherosclerosis.  (ICD10-I70.0) 9.  Emphysema (ICD10-J43.9). These results will be called to the ordering clinician or representative by the Radiologist Assistant, and communication documented in the PACS or [employer]. Electronically Signed   By: Nolon Nations M.D.   On: 08/07/2020 13:20   US Abdomen Limited RUQ (LIVER/GB)  Result Date: 08/09/2020 CLINICAL DATA:  Cholelithiasis Breast cancer EXAM: ULTRASOUND ABDOMEN LIMITED RIGHT UPPER QUADRANT COMPARISON:  CT chest, abdomen, and pelvis 08/07/2020 FINDINGS: Gallbladder: Sludge and stones noted within the gallbladder lumen. Mild thickening of the gallbladder wall. Sonographic Murphy sign negative per technologist. No pericholecystic fluid. Common bile duct: Diameter: 3 mm Liver: Nodular hepatic contours. Numerous hepatic masses again seen, most likely due to metastatic disease. Portal vein is patent on color Doppler imaging with normal direction of blood flow towards the liver. Other: None. IMPRESSION: 1. Cholelithiasis. Mild gallbladder wall thickening may be due to cholecystitis or reactive to liver disease. 2. Innumerable liver lesions consistent with metastatic disease. Electronically Signed   By: Miachel Roux M.D.   On: 08/09/2020 07:56    ASSESSMENT & PLAN:   80 y.o. female with  1.  Newly diagnosed multiple liver lesions concerning for metastases. No overt extrahepatic primary tumor noted on CT  chest abdomen pelvis. Possibilities are that the liver lesions are likely neoplastic though other etiologies including fungal infections, abscesses are possible. Within neoplastic etiologies the differential at this point is broad including upper GI, pancreaticobiliary and primary [place] with metastases, breast, melanoma, lymphoma, carcioid. 2.  Failure to thrive with very poor p.o. intake 3.  Severe hypokalemia with muscle weakness.  4. acute kidney injury likely related to dehydration diuretics. 5. 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  6. 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 S/p Lumpectomy, Radiation, 4 cycles of Cytoxan and Taxotere. Completed maintenance Arimidex until Fall 2019 Continues with annual mammograms with OBGYN Dr. TimDonalynn FurlongPLAN: -Discussed pt labwork today, 08/09/2020; improving with fluids and replacement. -Advised pt her potassium and kidney numbers are improving with replacement. -Advised pt that we must stop the ASA due to no way to compress the site of biopsy. She was given this today and thus cannot get the biopsy until Monday. -Advised pt that her plt of 250K are not increased worry for clotting.  -Would hold diuretics. -Would hold hydroxyurea at this time pending improvement in kidney function back to baseline. -Laxatives to address constipation. -Continue to hold Xarelto for her necessary biopsy. -Will get outpatient PET/CT to try to find primary tumor and more accurate staging of neoplastic process. Repeat mammogram. -Continue dexamethasone 4 mg p.o. with breakfast and lunch to help with inflammation in the liver that is causing persistent nausea. -Emphasized importance of finding a way to get the pt to start eating and drinking  again. -Discussed pt tumor markers; appears to be metastatic breast cancer unless proven otherwise. Not curable, but is very treatable. -Continue potassium and fluids. -Advised pt they would most likely discharge her tomorrow pending a biopsy date and pt can hold foods and fluids. -Advised pt hormone positive breast cancer typically progresses later than other types. We hope this would be more modifiable with hormone blockers. If liver function threatened, then less options than if not the case. -Will get MRI Brain.    I spent 40 minutes counseling the patient face to face. The total time spent in the appointment was 60 minutes and more than 50% was on counseling and direct patient cares.    GauSullivan Lone MS DobbinsHIVMS SCHEnd[facility] Center Of Toms RiverHCentral Valley Specialty Hospitalmatology/Oncology Physician ConAcoma-Canoncito-Laguna (Acl) HospitalOffice):       [phone] cell):  [phone]):           [phone]/02/2021 12:06 PM  I, RosReinaldo Raddlem acting as scribe for Dr. GauSullivan LoneD.  .I have reviewed the above documentation for accuracy and completeness, and I agree with the above. GauSullivan Lone MS

## 2020-08-09 NOTE — Progress Notes (Signed)
IR consulted by Dr. Marylyn Ishihara for possible image-guided liver lesion biopsy.  Case/images have been reviewed by Dr. Laurence Ferrari who approves procedure. Unfortunately, patient on Xarelto and Aspirin, LD yesterday 08/08/2020. Per IR protocol- Xarelto 24 hour hold and Aspirin 72 hour hold, therefore earliest procedure can occur on IP basis is Monday 08/13/2020. Discussed case with Dr. Loleta Books [facility]) and patient's oncologist Dr. Irene Limbo- patient will probably be discharged prior to Monday. Plan for image-guided liver lesion biopsy procedure on OP basis (Dr. Loleta Books to place order and call central scheduling at [phone] to expedite scheduling). No plans for IR procedure at this time- will delete order.  Please call IR with questions/concerns.   Bea Graff Nakari Bracknell, PA-C 08/09/2020, 11:29 AM

## 2020-08-09 NOTE — Progress Notes (Signed)
[facility] PROGRESS NOTE    Gwendolyn Williams  [id number] DOB: Apr 23, 1940 DOA: 08/08/2020 PCP: Gaynelle Arabian, MD      Brief Narrative:  Gwendolyn Williams is a 80 y.o. 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  Had CT this week that showed innumerable masses in liver.  Subsequently evaluted in Oncology office where she was found to have AKI and hypokalemia and was sent to the ER.        Assessment & Plan:  Acute kidney injfury on CKD IIIa Baseline Cr 1.1, here up to 1.8 on admission. Improving slightly with fluids to 1.5. -Continue IV fluids -Trend Cr -Advance oral fluids   Weakness Due to catabolism of mets, as well as weight loss, hypokalemia, dehydration. -PT eval  Hypokalemia Mag replete -Suppl K  Severe protein calorie malnutrition 7% weight loss since last 6 months, 44 kg to 40kg, also severe depletion of subcutaneous muscle mass and fat. 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   Difficult to place patient No       Level of care: Telemetry       MDM: The below labs and imaging reports were reviewed and summarized above.  Medication management as above.    DVT prophylaxis: SCDs Start: 08/08/20  1556  Code Status: FULL Family Communication: husband at bedside            Subjective: Mild headache overnight, resolved.  No chest pain, dyspnea, abdominal pain, may be some mild lower abdominal discomfort, but this is mild.  No vomiting.  No new confusion.  No fever.  Objective: Vitals:   08/08/20 2322 08/09/20 0322 08/09/20 0816 08/09/20 1405  BP: 127/79 119/72  122/78  Pulse: 64 (!) 58  67  Resp: 20 20  18   Temp: 97.8 F (36.6 C) 97.9 F (36.6 C)  98.3 F (36.8 C)  TempSrc: Oral Oral  Oral  SpO2: 100% 100% 100% 100%    Intake/Output Summary (Last 24 hours) at 08/09/2020 1417 Last data filed at 08/09/2020 0600 Gross per 24 hour  Intake 1596.31 ml  Output --  Net 1596.31 ml   There were no vitals filed for this visit.  Examination: General appearance: Thin elderly adult female, alert and in acute distress.   HEENT: Anicteric, conjunctiva pink, lids and lashes normal. No nasal deformity, discharge, epistaxis.  Lips moist.   Skin: Warm and dry.  No jaundice.  No suspicious rashes or lesions. Cardiac: RRR, nl [id number], soft systolic murmur.  Capillary refill is brisk.  JVP normal.  No LE edema.  Radial  pulses 2+ and symmetric. Respiratory: Normal respiratory rate and rhythm.  CTAB without rales or wheezes. Abdomen: Abdomen soft.  Mild  nonfocal TTP with voluntary guarding, no rigidity or rebound. No ascites, distension, hepatosplenomegaly.   MSK: No deformities or effusions.  Severe loss of subcutaneous muscle mass, severe depletion of subcutaneous fat, thenar wasting Neuro: Awake and alert.  EOMI, moves all extremities. Speech fluent.    Psych: Sensorium intact and responding to questions, mild psychomotor slowing, attention normal. Affect blunted.  Judgment and insight appear normal.    Data Reviewed: I have personally reviewed following labs and imaging studies:  CBC: Recent Labs  Lab 08/08/20 1052 08/09/20 0500  WBC 12.2* 8.2  NEUTROABS 10.4*  --   HGB  14.4 11.7*  HCT 47.1* 38.8  MCV 87.5 90.4  PLT 457* 657   Basic Metabolic Panel: Recent Labs  Lab 08/08/20 1052 08/08/20 1410 08/09/20 0500  NA 135  --  135  K 2.4*  --  3.5  CL 94*  --  105  CO2 27  --  22  GLUCOSE 95  --  67*  BUN 29*  --  24*  CREATININE 1.85*  --  1.53*  CALCIUM 10.3  --  8.9  MG  --  2.3  --   PHOS  --  3.8  --    GFR: Estimated Creatinine Clearance: 19.7 mL/min (A) (by C-G formula based on SCr of 1.53 mg/dL (H)). Liver Function Tests: Recent Labs  Lab 08/08/20 1052 08/09/20 0500  AST 109* 81*  ALT 70* 47*  ALKPHOS 222* 125  BILITOT 1.0 1.2  PROT 7.4 5.6*  ALBUMIN 3.7 3.0*   Recent Labs  Lab 08/08/20 1410  LIPASE 29   No results for input(s): AMMONIA in the last 168 hours. Coagulation Profile: Recent Labs  Lab 08/08/20 1410 08/09/20 0500  INR 2.3* 1.3*   Cardiac Enzymes: No results for input(s): CKTOTAL, CKMB, CKMBINDEX, TROPONINI in the last 168 hours. BNP (last 3 results) No results for input(s): PROBNP in the last 8760 hours. HbA1C: No results for input(s): HGBA1C in the last 72 hours. CBG: No results for input(s): GLUCAP in the last 168 hours. Lipid Profile: No results for input(s): CHOL, HDL, LDLCALC, TRIG, CHOLHDL, LDLDIRECT in the last 72 hours. Thyroid Function Tests: No results for input(s): TSH, T4TOTAL, FREET4, T3FREE, THYROIDAB in the last 72 hours. Anemia Panel: No results for input(s): VITAMINB12, FOLATE, FERRITIN, TIBC, IRON, RETICCTPCT in the last 72 hours. Urine analysis:    Component Value Date/Time   COLORURINE AMBER (A) 08/08/2020 1301   APPEARANCEUR CLEAR 08/08/2020 1301   LABSPEC 1.041 (H) 08/08/2020 1301   PHURINE 5.0 08/08/2020 1301   GLUCOSEU NEGATIVE 08/08/2020 1301   HGBUR SMALL (A) 08/08/2020 1301   BILIRUBINUR NEGATIVE 08/08/2020 1301   KETONESUR NEGATIVE 08/08/2020 1301   PROTEINUR 100 (A) 08/08/2020 1301   UROBILINOGEN 0.2 05/08/2008 2256   NITRITE NEGATIVE 08/08/2020 1301   LEUKOCYTESUR  NEGATIVE 08/08/2020 1301   Sepsis Labs: @LABRCNTIP (procalcitonin:4,lacticacidven:4)  ) Recent Results (from the past 240 hour(s))  Resp Panel by RT-PCR (Flu A&B, Covid) Nasopharyngeal Swab     Status: None   Collection Time: 08/08/20  2:07 PM   Specimen: Nasopharyngeal Swab; Nasopharyngeal(NP) swabs in vial transport medium  Result Value Ref Range Status   SARS Coronavirus 2 by RT PCR NEGATIVE NEGATIVE Final    Comment: (NOTE) SARS-CoV-2 target nucleic acids are NOT DETECTED.  The SARS-CoV-2 RNA is generally detectable in upper respiratory specimens during the acute phase of infection. The lowest concentration of SARS-CoV-2 viral copies this assay can detect is 138 copies/mL. A negative result  does not preclude SARS-Cov-2 infection and should not be used as the sole basis for treatment or other patient management decisions. A negative result may occur with  improper specimen collection/handling, submission of specimen other than nasopharyngeal swab, presence of viral mutation(s) within the areas targeted by this assay, and inadequate number of viral copies(<138 copies/mL). A negative result must be combined with clinical observations, patient history, and epidemiological information. The expected result is Negative.  Fact Sheet for Patients:  EntrepreneurPulse.com.au  Fact Sheet for Healthcare Providers:  IncredibleEmployment.be  This test is no t yet approved or cleared by the [place] FDA and  has been authorized for detection and/or diagnosis of SARS-CoV-2 by FDA under an Emergency Use Authorization (EUA). This EUA will remain  in effect (meaning this test can be used) for the duration of the COVID-19 declaration under Section 564(b)(1) of the Act, 21 U.S.C.section 360bbb-3(b)(1), unless the authorization is terminated  or revoked sooner.       Influenza A by PCR NEGATIVE NEGATIVE Final   Influenza B by PCR NEGATIVE NEGATIVE  Final    Comment: (NOTE) The Xpert Xpress SARS-CoV-2/FLU/RSV plus assay is intended as an aid in the diagnosis of influenza from Nasopharyngeal swab specimens and should not be used as a sole basis for treatment. Nasal washings and aspirates are unacceptable for Xpert Xpress SARS-CoV-2/FLU/RSV testing.  Fact Sheet for Patients: EntrepreneurPulse.com.au  Fact Sheet for Healthcare Providers: IncredibleEmployment.be  This test is not yet approved or cleared by the [place] FDA and has been authorized for detection and/or diagnosis of SARS-CoV-2 by FDA under an Emergency Use Authorization (EUA). This EUA will remain in effect (meaning this test can be used) for the duration of the COVID-19 declaration under Section 564(b)(1) of the Act, 21 U.S.C. section 360bbb-3(b)(1), unless the authorization is terminated or revoked.  Performed at [facility], [place] [address] [place], [place] [address]          Radiology Studies: US Abdomen Limited RUQ (LIVER/GB)  Result Date: 08/09/2020 CLINICAL DATA:  Cholelithiasis Breast cancer EXAM: ULTRASOUND ABDOMEN LIMITED RIGHT UPPER QUADRANT COMPARISON:  CT chest, abdomen, and pelvis 08/07/2020 FINDINGS: Gallbladder: Sludge and stones noted within the gallbladder lumen. Mild thickening of the gallbladder wall. Sonographic Murphy sign negative per technologist. No pericholecystic fluid. Common bile duct: Diameter: 3 mm Liver: Nodular hepatic contours. Numerous hepatic masses again seen, most likely due to metastatic disease. Portal vein is patent on color Doppler imaging with normal direction of blood flow towards the liver. Other: None. IMPRESSION: 1. Cholelithiasis. Mild gallbladder wall thickening may be due to cholecystitis or reactive to liver disease. 2. Innumerable liver lesions consistent with metastatic disease. Electronically Signed   By: Miachel Roux M.D.   On: 08/09/2020 07:56         Scheduled Meds: . buPROPion  150 mg Oral q morning  . cholecalciferol  4,000 Units Oral Daily  . docusate sodium  100 mg Oral Daily  . feeding supplement  237 mL Oral BID BM  . fluticasone furoate-vilanterol  1 puff Inhalation Daily  . folic acid  1 mg Oral Daily  . pantoprazole  40 mg Oral Daily  . polyethylene glycol  17 g Oral Daily  . protein supplement  1 Scoop Oral TID WC   Continuous Infusions: . lactated ringers with kcl       LOS: 1 day    Time spent: 35 minutes    Edwin Dada, MD [facility] 08/09/2020, 2:17 PM  Please page though [place] or Epic secure chat:  For [employer], Adult nurse

## 2020-08-10 ENCOUNTER — Other Ambulatory Visit (HOSPITAL_COMMUNITY): Payer: Self-pay

## 2020-08-10 ENCOUNTER — Other Ambulatory Visit: Payer: Self-pay | Admitting: Hematology

## 2020-08-10 ENCOUNTER — Telehealth: Payer: Self-pay | Admitting: *Deleted

## 2020-08-10 ENCOUNTER — Other Ambulatory Visit: Payer: Self-pay | Admitting: Student

## 2020-08-10 DIAGNOSIS — C787 Secondary malignant neoplasm of liver and intrahepatic bile duct: Secondary | ICD-10-CM

## 2020-08-10 DIAGNOSIS — E876 Hypokalemia: Secondary | ICD-10-CM | POA: Diagnosis not present

## 2020-08-10 DIAGNOSIS — E43 Unspecified severe protein-calorie malnutrition: Secondary | ICD-10-CM | POA: Diagnosis not present

## 2020-08-10 DIAGNOSIS — C801 Malignant (primary) neoplasm, unspecified: Secondary | ICD-10-CM

## 2020-08-10 LAB — CHROMOGRANIN A: Chromogranin A (ng/mL): 980.5 ng/mL — ABNORMAL HIGH (ref 0.0–101.8)

## 2020-08-10 LAB — BASIC METABOLIC PANEL
Anion gap: 7 (ref 5–15)
BUN: 24 mg/dL — ABNORMAL HIGH (ref 8–23)
CO2: 27 mmol/L (ref 22–32)
Calcium: 9 mg/dL (ref 8.9–10.3)
Chloride: 105 mmol/L (ref 98–111)
Creatinine, Ser: 1.46 mg/dL — ABNORMAL HIGH (ref 0.44–1.00)
GFR, Estimated: 37 mL/min — ABNORMAL LOW (ref >60)
Glucose, Bld: 172 mg/dL — ABNORMAL HIGH (ref 70–99)
Potassium: 4 mmol/L (ref 3.5–5.1)
Sodium: 139 mmol/L (ref 135–145)

## 2020-08-10 MED ORDER — DEXAMETHASONE 4 MG PO TABS
4.0000 mg | ORAL_TABLET | Freq: Two times a day (BID) | ORAL | 3 refills | Status: DC
  Filled 2020-08-10: qty 60, 30d supply, fill #0

## 2020-08-10 MED ORDER — ONDANSETRON HCL 4 MG PO TABS
4.0000 mg | ORAL_TABLET | Freq: Four times a day (QID) | ORAL | 3 refills | Status: DC | PRN
  Filled 2020-08-10: qty 30, 8d supply, fill #0

## 2020-08-10 MED ORDER — POTASSIUM CHLORIDE ER 8 MEQ PO TBCR
8.0000 meq | EXTENDED_RELEASE_TABLET | Freq: Every day | ORAL | 3 refills | Status: DC
  Filled 2020-08-10: qty 30, 30d supply, fill #0
  Filled 2020-09-19 (×2): qty 30, 30d supply, fill #1

## 2020-08-10 NOTE — Telephone Encounter (Signed)
Notified of message below. MRI is scheduled for Saturday.  Radiology scheduling to call her Monday to arrange PET.   RN will call [facility] for mammo results.   RN will call [facility] to arrange mammogram.  Message to scheduler for labs and Dr Irene Limbo on Thursday.

## 2020-08-10 NOTE — Plan of Care (Signed)
°  Problem: Coping: °Goal: Level of anxiety will decrease °Outcome: Progressing °  °

## 2020-08-10 NOTE — Discharge Summary (Signed)
Physician Discharge Summary  Gwendolyn Williams [id number] DOB: 15-Mar-1941 DOA: 08/08/2020  PCP: Gaynelle Arabian, MD  Admit date: 08/08/2020 Discharge date: 08/10/2020  Admitted From: Home  Disposition:  Home   Recommendations for Outpatient Follow-up:  1. Follow up with IR for liver biopsy Monday at 1PM 2. Follow up with Dr. Irene Limbo for biopsy results 3. Dr. Irene Limbo: Please obtain potassium level at follow up and adjust as needed   Home Health: None  Equipment/Devices: None  Discharge Condition: Weak but mentating well  CODE STATUS: FULL Diet recommendation: Regular  Brief/Interim Summary: Gwendolyn Williams is a 80 y.o. 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  Had CT this week that showed innumerable masses in liver.  Subsequently evaluted in Oncology office where she was found to have AKI and hypokalemia and was sent to the ER.     PRINCIPAL HOSPITAL DIAGNOSIS: Acute kidney injury due to dehydration    Discharge Diagnoses:   Acute kidney injury on CKD IIIa Baseline Cr 1.1, here up to 1.8 on admission.  Clinically consistent with dehydration.  Improved with fluids to 1.4.    Liver masses Biopsy was arranged for Monday.  Aspirin was held.  Xarelto will be held at least 24 hours prior to procedure.  Started on Decadron.  Hypokalemia Magnesium normal.  Potassium was supplemented.  Severe protein calorie malnutrition 7% weight loss since last 6 months, 44 kg to 40kg, also severe depletion of subcutaneous muscle mass and fat.  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                  Discharge Instructions  Discharge Instructions    Discharge instructions   Complete by: As directed    From Dr. Loleta Books: You were  admitted for weakness, dehydration causing mild kidney injury and low potassium  You were treated with fluids and the low potassium resolved and the kidney injury improved.  You should drink plenty of fluids  Use ondansetron if needed Take dexamethasone/Decadron 4 mg twice daily until Dr. Irene Limbo tells you to change the dose THis is for liver pain and appetite stimulation  Have Dr. Irene Limbo check your lab work in 1 week  For now: Your blood pressure is fine without it, so do not take triamterene/HCTZ for now Do take potassium chloride 8 mEq once daily and have labs checked in 1 week   For the biopsy Monday: -**Do NOT eat anything after 7am on Monday** -Check in to [facility] admissions by 11am on Monday -DO NOT take aspirin until the biopsy (you may resume aspirin on Tuesday) -You may take your Xarelto tonight (Friday) but do not take it over the weekend, NO XARELTO within 24 hours of the procedure Monday. -Do not take your biotin this weekend, it can interfere with some lab testing -Of course you will, but they asked me to remind you to bring a driver to take you home   Increase activity slowly   Complete by: As directed      Allergies as of 08/10/2020      Reactions   Lipitor [atorvastatin Calcium]    Weakness in legs    Penicillins Hives   Has patient had a PCN reaction causing immediate rash, facial/tongue/throat swelling, SOB or lightheadedness with hypotension: Unknown Has patient had a  PCN reaction causing severe rash involving mucus membranes or skin necrosis: Unknown Has patient had a PCN reaction that required hospitalization: No Has patient had a PCN reaction occurring within the last 10 years: No If all of the above answers are "NO", then may proceed with Cephalosporin use.   Sulfa Antibiotics Other (See Comments)   unknown   Tamiflu [oseltamivir Phosphate] Nausea And Vomiting      Medication List    STOP taking these medications   Bayer Aspirin EC Low Dose 81 MG EC  tablet Generic drug: aspirin   triamterene-hydrochlorothiazide 37.5-25 MG tablet Commonly known as: MAXZIDE-25     TAKE these medications   acetaminophen 500 MG tablet Commonly known as: TYLENOL Take 500 mg by mouth every 6 (six) hours as needed for moderate pain.   albuterol 108 (90 Base) MCG/ACT inhaler Commonly known as: VENTOLIN HFA Inhale 2 puffs into the lungs every 6 (six) hours as needed for wheezing or shortness of breath.   amLODipine 2.5 MG tablet Commonly known as: NORVASC Take 2.5 mg by mouth daily.   amLODipine 2.5 MG tablet Commonly known as: NORVASC Take 1 tablet (2.5 mg total) by mouth daily. TO BE TAKEN WITH THE 5 MG TABLET TO EQUAL 7.5 MG DAILY   Biotin 10 MG Tabs Take 1 tablet by mouth daily.   Breo Ellipta 200-25 MCG/INH Aepb Generic drug: fluticasone furoate-vilanterol USE 1 INHALATION DAILY   buPROPion 150 MG 24 hr tablet Commonly known as: WELLBUTRIN XL Take 1 tablet by mouth every morning.   Co Q-10 100 MG Caps Take 1 capsule by mouth daily.   dexamethasone 4 MG tablet Commonly known as: DECADRON Take 1 tablet (4 mg total) by mouth every 12 (twelve) hours.   diazepam 5 MG tablet Commonly known as: VALIUM Take 5 mg by mouth every 6 (six) hours as needed for anxiety.   docusate sodium 100 MG capsule Commonly known as: Colace Take 1 capsule (100 mg total) by mouth 2 (two) times daily. What changed: when to take this   esomeprazole 40 MG capsule Commonly known as: NEXIUM Take 40 mg by mouth daily at 6 (six) AM.   folic acid 1 MG tablet Commonly known as: FOLVITE Take 1 mg by mouth daily.   hydroxyurea 500 MG capsule Commonly known as: HYDREA TAKE ONE CAPSULE BY MOUTH DAILY WITH FOOD TO MINIMIZE GI SIDE EFFECTS   hyoscyamine 0.125 MG SL tablet Commonly known as: LEVSIN SL Take 1 to 3 tablets three times daily as needed What changed:   how much to take  how to take this  when to take this  reasons to take this  additional  instructions   ondansetron 4 MG tablet Commonly known as: ZOFRAN Take 1 tablet (4 mg total) by mouth every 6 (six) hours as needed for nausea.   polyethylene glycol 17 g packet Commonly known as: MIRALAX / GLYCOLAX Take 17 g by mouth 2 (two) times daily. What changed: when to take this   potassium chloride 8 MEQ tablet Commonly known as: KLOR-CON Take 1 tablet (8 mEq total) by mouth daily.   rivaroxaban 10 MG Tabs tablet Commonly known as: XARELTO Take 1 tablet (10 mg total) by mouth daily.   rosuvastatin 40 MG tablet Commonly known as: CRESTOR Take 1 tablet (40 mg total) by mouth daily.   Spacer/Aero-Holding [employer] 1 Device by Does not apply route as directed.   Vitamin D 50 MCG (2000 UT) Caps Take 4,000 Units by mouth  daily.       Follow-up Information    Brunetta Genera, MD. Schedule an appointment as soon as possible for a visit in 1 week(s).   Specialties: Hematology, Oncology Contact information: [address] [place] [address] [phone]              Allergies  Allergen Reactions  . Lipitor [Atorvastatin Calcium]     Weakness in legs   . Penicillins Hives    Has patient had a PCN reaction causing immediate rash, facial/tongue/throat swelling, SOB or lightheadedness with hypotension: Unknown Has patient had a PCN reaction causing severe rash involving mucus membranes or skin necrosis: Unknown Has patient had a PCN reaction that required hospitalization: No Has patient had a PCN reaction occurring within the last 10 years: No If all of the above answers are "NO", then may proceed with Cephalosporin use.   . Sulfa Antibiotics Other (See Comments)    unknown  . Tamiflu [Oseltamivir Phosphate] Nausea And Vomiting    Consultations:  Oncology, Dr. Irene Limbo   Procedures/Studies: CT CHEST ABDOMEN PELVIS W CONTRAST  Result Date: 08/07/2020 CLINICAL DATA:  3 pound weight loss over the past few weeks. Abdominal distention and  nausea. RIGHT LOWER QUADRANT swelling. History of LEFT breast cancer with lumpectomy. History of colon resection. History of smoking. EXAM: CT CHEST, ABDOMEN, AND PELVIS WITH CONTRAST TECHNIQUE: Multidetector CT imaging of the chest, abdomen and pelvis was performed following the standard protocol during bolus administration of intravenous contrast. CONTRAST:  37mL ISOVUE-300 IOPAMIDOL (ISOVUE-300) INJECTION 61% COMPARISON:  02/12/2018 and 11/20/2016 FINDINGS: CT CHEST FINDINGS Cardiovascular: Heart size is normal. There is atherosclerotic calcification of the arteries. There is atherosclerotic calcification of the thoracic aorta not associated with aneurysm. No pericardial effusion. Pulmonary arteries are normally opacified account for the contrast bolus timing. Mediastinum/Nodes: Esophagus is normal. No mediastinal, hilar, or axillary adenopathy. A 5 millimeter nodule is identified within the LEFT lobe of the thyroid gland. Otherwise the thyroid is normal in appearance. Lungs/Pleura: There are emphysematous changes throughout the lungs. No pulmonary nodules, consolidations, or pleural effusions. There is minimal RIGHT apical scarring. Musculoskeletal: Remote LOWER cervical spine fusion. Minimal degenerative changes in the midthoracic spine. No acute fracture or subluxation. No suspicious lytic or blastic lesions. CT ABDOMEN PELVIS FINDINGS Hepatobiliary: Liver size is UPPER limits normal. The liver contains innumerable rim enhancing lesions throughout all lobes. Confluent low-attenuation is identified within the anterior aspect of the RIGHT hepatic lobe. Small amount of perihepatic fluid is present. The largest, confluent lesion measures approximately 6.5 x 4.5 centimeters. Other more discrete rounded lesions measure from 6 millimeters to 3.0 centimeters. There is a small amount of pericholecystic fluid. Calcified gallstone measures at least 1.6 centimeters. There is enhancement of the mucosa of the gallbladder.  Consider further evaluation with gallbladder ultrasound. Pancreas: Unremarkable. No pancreatic ductal dilatation or surrounding inflammatory changes. Spleen: Normal in size without focal abnormality. Adrenals/Urinary Tract: Adrenal glands are normal in appearance. A 1 centimeter simple cyst is identified in UPPER pole of the LEFT kidney. There is symmetric bilateral renal enhancement and early excretion of contrast. The ureters are unremarkable. Distal aspects of the ureters are partially obscured by a over lying artifact. The urinary bladder is otherwise normal in appearance. Stomach/Bowel: The stomach and small bowel loops are normal in appearance. Small bowel loops are normal in appearance. The appendix is not well seen. Central pelvic small bowel loops are partially obscured by metal artifact in the LEFT hip. Colo-colic anastomosis is identified  in the LEFT mid abdomen and is distended with a significant amount of stool. No obstructing mass identified. Vascular/Lymphatic: There is atherosclerotic calcification of the abdominal aorta. Portacaval lymph node is 1.1 centimeters. Unknown along the dorsum of the pancreatic neck is 1.1 centimeters. Although involved by atherosclerosis, there is vascular opacification of the celiac axis, superior mesenteric artery, and inferior mesenteric artery. Normal appearance of the portal venous system and inferior vena cava. Reproductive: The uterus is present.  No adnexal mass. Other: No free pelvic fluid. Anterior abdominal wall is unremarkable. Musculoskeletal: Degenerative changes are seen in the lumbar spine. No lytic or blastic lesions are noted. Previous LEFT hip arthroplasty. IMPRESSION: 1. Innumerable liver lesions consistent with diffuse metastatic disease. Confluent lesion in the LEFT hepatic lobe is 6.5 centimeters. 2. Cholelithiasis and enhancement of the gallbladder mucosa along with a small amount of pericholecystic fluid raise the question of gallbladder disease.  Consider further evaluation gallbladder ultrasound. 3. Previous colon resection with follow-up pelvic anastomosis in the LEFT mid abdomen. 4. Significant stool burden. 5. Minimally enlarged lymph nodes in the periportal region. 6.  Aortic atherosclerosis.  (ICD10-I70.0) 7. No evidence for osseous metastatic disease. 8. Aortic atherosclerosis.  (ICD10-I70.0) 9.  Emphysema (ICD10-J43.9). These results will be called to the ordering clinician or representative by the Radiologist Assistant, and communication documented in the PACS or [employer]. Electronically Signed   By: Nolon Nations M.D.   On: 08/07/2020 13:20   US Abdomen Limited RUQ (LIVER/GB)  Result Date: 08/09/2020 CLINICAL DATA:  Cholelithiasis Breast cancer EXAM: ULTRASOUND ABDOMEN LIMITED RIGHT UPPER QUADRANT COMPARISON:  CT chest, abdomen, and pelvis 08/07/2020 FINDINGS: Gallbladder: Sludge and stones noted within the gallbladder lumen. Mild thickening of the gallbladder wall. Sonographic Murphy sign negative per technologist. No pericholecystic fluid. Common bile duct: Diameter: 3 mm Liver: Nodular hepatic contours. Numerous hepatic masses again seen, most likely due to metastatic disease. Portal vein is patent on color Doppler imaging with normal direction of blood flow towards the liver. Other: None. IMPRESSION: 1. Cholelithiasis. Mild gallbladder wall thickening may be due to cholecystitis or reactive to liver disease. 2. Innumerable liver lesions consistent with metastatic disease. Electronically Signed   By: Miachel Roux M.D.   On: 08/09/2020 07:56       Subjective: No fever, confusion, respiratory distress.  No pain complaints.  Angry, justifiably.  Discharge Exam: Vitals:   08/10/20 0607 08/10/20 0852  BP: 128/81   Pulse: (!) 55   Resp: 17   Temp: (!) 97.4 F (36.3 C)   SpO2: 99% 98%   Vitals:   08/09/20 1405 08/09/20 2309 08/10/20 0607 08/10/20 0852  BP: 122/78 105/64 128/81   Pulse: 67 (!) 58 (!) 55   Resp: 18  17 17    Temp: 98.3 F (36.8 C) 98.7 F (37.1 C) (!) 97.4 F (36.3 C)   TempSrc: Oral Oral Oral   SpO2: 100% 99% 99% 98%    General: Pt is alert, awake, not in acute distress, sitting in bed. Cardiovascular: RRR, nl S1-S2, no murmurs appreciated.      Respiratory: Normal respiratory rate and rhythm.  CTAB without rales or wheezes. Neuro/Psych: Strength symmetric in upper and lower extremities.  Judgment and insight appear normal.   The results of significant diagnostics from this hospitalization (including imaging, microbiology, ancillary and laboratory) are listed below for reference.     Microbiology: Recent Results (from the past 240 hour(s))  Resp Panel by RT-PCR (Flu A&B, Covid) Nasopharyngeal Swab     Status:  None   Collection Time: 08/08/20  2:07 PM   Specimen: Nasopharyngeal Swab; Nasopharyngeal(NP) swabs in vial transport medium  Result Value Ref Range Status   SARS Coronavirus 2 by RT PCR NEGATIVE NEGATIVE Final    Comment: (NOTE) SARS-CoV-2 target nucleic acids are NOT DETECTED.  The SARS-CoV-2 RNA is generally detectable in upper respiratory specimens during the acute phase of infection. The lowest concentration of SARS-CoV-2 viral copies this assay can detect is 138 copies/mL. A negative result does not preclude SARS-Cov-2 infection and should not be used as the sole basis for treatment or other patient management decisions. A negative result may occur with  improper specimen collection/handling, submission of specimen other than nasopharyngeal swab, presence of viral mutation(s) within the areas targeted by this assay, and inadequate number of viral copies(<138 copies/mL). A negative result must be combined with clinical observations, patient history, and epidemiological information. The expected result is Negative.  Fact Sheet for Patients:  EntrepreneurPulse.com.au  Fact Sheet for Healthcare Providers:   IncredibleEmployment.be  This test is no t yet approved or cleared by the [place] FDA and  has been authorized for detection and/or diagnosis of SARS-CoV-2 by FDA under an Emergency Use Authorization (EUA). This EUA will remain  in effect (meaning this test can be used) for the duration of the COVID-19 declaration under Section 564(b)(1) of the Act, 21 U.S.C.section 360bbb-3(b)(1), unless the authorization is terminated  or revoked sooner.       Influenza A by PCR NEGATIVE NEGATIVE Final   Influenza B by PCR NEGATIVE NEGATIVE Final    Comment: (NOTE) The Xpert Xpress SARS-CoV-2/FLU/RSV plus assay is intended as an aid in the diagnosis of influenza from Nasopharyngeal swab specimens and should not be used as a sole basis for treatment. Nasal washings and aspirates are unacceptable for Xpert Xpress SARS-CoV-2/FLU/RSV testing.  Fact Sheet for Patients: EntrepreneurPulse.com.au  Fact Sheet for Healthcare Providers: IncredibleEmployment.be  This test is not yet approved or cleared by the [place] FDA and has been authorized for detection and/or diagnosis of SARS-CoV-2 by FDA under an Emergency Use Authorization (EUA). This EUA will remain in effect (meaning this test can be used) for the duration of the COVID-19 declaration under Section 564(b)(1) of the Act, 21 U.S.C. section 360bbb-3(b)(1), unless the authorization is terminated or revoked.  Performed at [facility], [place], [place] [address] [place],  [address]      Labs: BNP (last 3 results) Recent Labs    08/08/20 1411  BNP [id number]   Basic Metabolic Panel: Recent Labs  Lab 08/08/20 1052 08/08/20 1410 08/09/20 0500 08/10/20 0542  NA 135  --  135 139  K 2.4*  --  3.5 4.0  CL 94*  --  105 105  CO2 27  --  22 27  GLUCOSE 95  --  67* 172*  BUN 29*  --  24* 24*  CREATININE 1.85*  --  1.53* 1.46*  CALCIUM 10.3  --  8.9 9.0  MG  --   2.3  --   --   PHOS  --  3.8  --   --    Liver Function Tests: Recent Labs  Lab 08/08/20 1052 08/09/20 0500  AST 109* 81*  ALT 70* 47*  ALKPHOS 222* 125  BILITOT 1.0 1.2  PROT 7.4 5.6*  ALBUMIN 3.7 3.0*   Recent Labs  Lab 08/08/20 1410  LIPASE 29   No results for input(s): AMMONIA in the last 168 hours. CBC: Recent Labs  Lab 08/08/20 1052 08/09/20 0500  WBC 12.2* 8.2  NEUTROABS 10.4*  --   HGB 14.4 11.7*  HCT 47.1* 38.8  MCV 87.5 90.4  PLT 457* 263   Cardiac Enzymes: No results for input(s): CKTOTAL, CKMB, CKMBINDEX, TROPONINI in the last 168 hours. BNP: Invalid input(s): POCBNP CBG: No results for input(s): GLUCAP in the last 168 hours. D-Dimer No results for input(s): DDIMER in the last 72 hours. Hgb A1c No results for input(s): HGBA1C in the last 72 hours. Lipid Profile No results for input(s): CHOL, HDL, LDLCALC, TRIG, CHOLHDL, LDLDIRECT in the last 72 hours. Thyroid function studies No results for input(s): TSH, T4TOTAL, T3FREE, THYROIDAB in the last 72 hours.  Invalid input(s): FREET3 Anemia work up No results for input(s): VITAMINB12, FOLATE, FERRITIN, TIBC, IRON, RETICCTPCT in the last 72 hours. Urinalysis    Component Value Date/Time   COLORURINE AMBER (A) 08/08/2020 1301   APPEARANCEUR CLEAR 08/08/2020 1301   LABSPEC 1.041 (H) 08/08/2020 1301   PHURINE 5.0 08/08/2020 1301   GLUCOSEU NEGATIVE 08/08/2020 1301   HGBUR SMALL (A) 08/08/2020 1301   BILIRUBINUR NEGATIVE 08/08/2020 1301   KETONESUR NEGATIVE 08/08/2020 1301   PROTEINUR 100 (A) 08/08/2020 1301   UROBILINOGEN 0.2 05/08/2008 2256   NITRITE NEGATIVE 08/08/2020 1301   LEUKOCYTESUR NEGATIVE 08/08/2020 1301   Sepsis Labs Invalid input(s): PROCALCITONIN,  WBC,  LACTICIDVEN Microbiology Recent Results (from the past 240 hour(s))  Resp Panel by RT-PCR (Flu A&B, Covid) Nasopharyngeal Swab     Status: None   Collection Time: 08/08/20  2:07 PM   Specimen: Nasopharyngeal Swab;  Nasopharyngeal(NP) swabs in vial transport medium  Result Value Ref Range Status   SARS Coronavirus 2 by RT PCR NEGATIVE NEGATIVE Final    Comment: (NOTE) SARS-CoV-2 target nucleic acids are NOT DETECTED.  The SARS-CoV-2 RNA is generally detectable in upper respiratory specimens during the acute phase of infection. The lowest concentration of SARS-CoV-2 viral copies this assay can detect is 138 copies/mL. A negative result does not preclude SARS-Cov-2 infection and should not be used as the sole basis for treatment or other patient management decisions. A negative result may occur with  improper specimen collection/handling, submission of specimen other than nasopharyngeal swab, presence of viral mutation(s) within the areas targeted by this assay, and inadequate number of viral copies(<138 copies/mL). A negative result must be combined with clinical observations, patient history, and epidemiological information. The expected result is Negative.  Fact Sheet for Patients:  EntrepreneurPulse.com.au  Fact Sheet for Healthcare Providers:  IncredibleEmployment.be  This test is no t yet approved or cleared by the [place] FDA and  has been authorized for detection and/or diagnosis of SARS-CoV-2 by FDA under an Emergency Use Authorization (EUA). This EUA will remain  in effect (meaning this test can be used) for the duration of the COVID-19 declaration under Section 564(b)(1) of the Act, 21 U.S.C.section 360bbb-3(b)(1), unless the authorization is terminated  or revoked sooner.       Influenza A by PCR NEGATIVE NEGATIVE Final   Influenza B by PCR NEGATIVE NEGATIVE Final    Comment: (NOTE) The Xpert Xpress SARS-CoV-2/FLU/RSV plus assay is intended as an aid in the diagnosis of influenza from Nasopharyngeal swab specimens and should not be used as a sole basis for treatment. Nasal washings and aspirates are unacceptable for Xpert Xpress  SARS-CoV-2/FLU/RSV testing.  Fact Sheet for Patients: EntrepreneurPulse.com.au  Fact Sheet for Healthcare Providers: IncredibleEmployment.be  This test is not yet approved or cleared by the [place]  FDA and has been authorized for detection and/or diagnosis of SARS-CoV-2 by FDA under an Emergency Use Authorization (EUA). This EUA will remain in effect (meaning this test can be used) for the duration of the COVID-19 declaration under Section 564(b)(1) of the Act, 21 U.S.C. section 360bbb-3(b)(1), unless the authorization is terminated or revoked.  Performed at [facility], [place] [address], [place], [place] [address]      Time coordinating discharge: 25  minutes    30 Day Unplanned Readmission Risk Score   Flowsheet Row ED to Hosp-Admission (Discharged) from 08/08/2020 in [facility]  30 Day Unplanned Readmission Risk Score (%) 27.78 Filed at 08/10/2020 0801     This score is the patient's risk of an unplanned readmission within 30 days of being discharged (0 -100%). The score is based on dignosis, age, lab data, medications, orders, and past utilization.   Low:  0-14.9   Medium: 15-21.9   High: 22-29.9   Extreme: 30 and above           SIGNED:   Edwin Dada, MD  [facility] 08/10/2020, 6:46 PM

## 2020-08-10 NOTE — Plan of Care (Signed)

## 2020-08-10 NOTE — Telephone Encounter (Signed)
-----   Message from Brunetta Genera, MD sent at 08/10/2020  3:14 PM EDT ----- Regarding: patient followup Gwendolyn Williams, Please let patient know that we have put in requests for her PET CT scan and MRI of the brain.  We can give her the central radiology scheduling number to schedule these. We also tried need to try to get her previous mammogram results from [facility] since these are not in our system and we are suspecting metastatic breast cancer based on tumor markers. I have placed order mammogram order for the GI/ breast surgery center but she could call [facility] and set up mammogram and we might have to fax the order there instead if she prefers. Please set up follow-up with me Thursday of next week with labs CBC/differential, CMP.  Encourage her to drink a lot of water so she gets flushed for her MRI contrast study. Thanks Kirby

## 2020-08-11 ENCOUNTER — Ambulatory Visit (HOSPITAL_COMMUNITY)
Admission: RE | Admit: 2020-08-11 | Discharge: 2020-08-11 | Disposition: A | Discharge status: Home / Self Care | Payer: Medicare Other | Source: Ambulatory Visit | Attending: Hematology | Admitting: Hematology

## 2020-08-11 ENCOUNTER — Other Ambulatory Visit: Payer: Self-pay

## 2020-08-11 DIAGNOSIS — C787 Secondary malignant neoplasm of liver and intrahepatic bile duct: Secondary | ICD-10-CM | POA: Diagnosis not present

## 2020-08-11 DIAGNOSIS — C801 Malignant (primary) neoplasm, unspecified: Secondary | ICD-10-CM | POA: Insufficient documentation

## 2020-08-11 MED ORDER — GADOBUTROL 1 MMOL/ML IV SOLN
4.0000 mL | Freq: Once | INTRAVENOUS | Status: AC | PRN
  Administered 2020-08-11: 4 mL via INTRAVENOUS

## 2020-08-13 ENCOUNTER — Encounter (HOSPITAL_COMMUNITY): Payer: Self-pay

## 2020-08-13 ENCOUNTER — Other Ambulatory Visit (HOSPITAL_COMMUNITY): Payer: Self-pay

## 2020-08-13 ENCOUNTER — Other Ambulatory Visit: Payer: Self-pay

## 2020-08-13 ENCOUNTER — Telehealth: Payer: Self-pay | Admitting: Hematology

## 2020-08-13 ENCOUNTER — Ambulatory Visit (HOSPITAL_COMMUNITY)
Admission: RE | Admit: 2020-08-13 | Discharge: 2020-08-13 | Disposition: A | Discharge status: Home / Self Care | Payer: Medicare Other | Source: Ambulatory Visit | Attending: Family Medicine | Admitting: Family Medicine

## 2020-08-13 DIAGNOSIS — N189 Chronic kidney disease, unspecified: Secondary | ICD-10-CM | POA: Diagnosis not present

## 2020-08-13 DIAGNOSIS — C787 Secondary malignant neoplasm of liver and intrahepatic bile duct: Secondary | ICD-10-CM | POA: Diagnosis not present

## 2020-08-13 DIAGNOSIS — I129 Hypertensive chronic kidney disease with stage 1 through stage 4 chronic kidney disease, or unspecified chronic kidney disease: Secondary | ICD-10-CM | POA: Diagnosis not present

## 2020-08-13 DIAGNOSIS — Z801 Family history of malignant neoplasm of trachea, bronchus and lung: Secondary | ICD-10-CM | POA: Diagnosis not present

## 2020-08-13 DIAGNOSIS — Z7901 Long term (current) use of anticoagulants: Secondary | ICD-10-CM | POA: Insufficient documentation

## 2020-08-13 DIAGNOSIS — Z87891 Personal history of nicotine dependence: Secondary | ICD-10-CM | POA: Diagnosis not present

## 2020-08-13 DIAGNOSIS — Z79899 Other long term (current) drug therapy: Secondary | ICD-10-CM | POA: Insufficient documentation

## 2020-08-13 DIAGNOSIS — C229 Malignant neoplasm of liver, not specified as primary or secondary: Secondary | ICD-10-CM | POA: Diagnosis not present

## 2020-08-13 DIAGNOSIS — Z803 Family history of malignant neoplasm of breast: Secondary | ICD-10-CM | POA: Insufficient documentation

## 2020-08-13 DIAGNOSIS — C801 Malignant (primary) neoplasm, unspecified: Secondary | ICD-10-CM | POA: Diagnosis not present

## 2020-08-13 DIAGNOSIS — Z853 Personal history of malignant neoplasm of breast: Secondary | ICD-10-CM | POA: Diagnosis not present

## 2020-08-13 DIAGNOSIS — K769 Liver disease, unspecified: Secondary | ICD-10-CM | POA: Diagnosis not present

## 2020-08-13 DIAGNOSIS — Z86718 Personal history of other venous thrombosis and embolism: Secondary | ICD-10-CM | POA: Insufficient documentation

## 2020-08-13 DIAGNOSIS — K7689 Other specified diseases of liver: Secondary | ICD-10-CM | POA: Diagnosis not present

## 2020-08-13 LAB — CBC
HCT: 42.4 % (ref 36.0–46.0)
Hemoglobin: 13.1 g/dL (ref 12.0–15.0)
MCH: 27.5 pg (ref 26.0–34.0)
MCHC: 30.9 g/dL (ref 30.0–36.0)
MCV: 88.9 fL (ref 80.0–100.0)
Platelets: 266 10*3/uL (ref 150–400)
RBC: 4.77 MIL/uL (ref 3.87–5.11)
RDW: 19.5 % — ABNORMAL HIGH (ref 11.5–15.5)
WBC: 11.3 10*3/uL — ABNORMAL HIGH (ref 4.0–10.5)
nRBC: 0 % (ref 0.0–0.2)

## 2020-08-13 LAB — PROTIME-INR
INR: 1.1 (ref 0.8–1.2)
Prothrombin Time: 14.3 seconds (ref 11.4–15.2)

## 2020-08-13 MED ORDER — FENTANYL CITRATE (PF) 100 MCG/2ML IJ SOLN
INTRAMUSCULAR | Status: AC | PRN
  Administered 2020-08-13: 50 ug via INTRAVENOUS
  Administered 2020-08-13 (×2): 25 ug via INTRAVENOUS

## 2020-08-13 MED ORDER — SODIUM CHLORIDE 0.9 % IV SOLN: INTRAVENOUS | Status: DC

## 2020-08-13 MED ORDER — FENTANYL CITRATE (PF) 100 MCG/2ML IJ SOLN
INTRAMUSCULAR | Status: AC
  Filled 2020-08-13: qty 2

## 2020-08-13 MED ORDER — MIDAZOLAM HCL 2 MG/2ML IJ SOLN
INTRAMUSCULAR | Status: AC
  Filled 2020-08-13: qty 2

## 2020-08-13 MED ORDER — LIDOCAINE HCL (PF) 1 % IJ SOLN
INTRAMUSCULAR | Status: AC
  Filled 2020-08-13: qty 30

## 2020-08-13 MED ORDER — MIDAZOLAM HCL 2 MG/2ML IJ SOLN
INTRAMUSCULAR | Status: AC | PRN
  Administered 2020-08-13: 0.5 mg via INTRAVENOUS
  Administered 2020-08-13: 1 mg via INTRAVENOUS
  Administered 2020-08-13: 0.5 mg via INTRAVENOUS

## 2020-08-13 MED ORDER — GELATIN ABSORBABLE 12-7 MM EX MISC
CUTANEOUS | Status: AC
  Filled 2020-08-13: qty 1

## 2020-08-13 MED ORDER — OXYCODONE HCL 5 MG PO TABS: 5.0000 mg | ORAL_TABLET | ORAL | Status: DC | PRN

## 2020-08-13 NOTE — Telephone Encounter (Signed)
Scheduled appts per 5/13 sch msg. Called pt, no answer. Left msg with appts date and times.  

## 2020-08-13 NOTE — Procedures (Signed)
Interventional Radiology Procedure:   Indications: Liver lesions and needs tissue diagnosis  Procedure: US guided liver lesion biopsy  Findings: Innumerable liver lesions.  Cores from right hepatic lobe  Complications: None     EBL: less than 10 ml  Plan: Bedrest 3 hours   Gwendolyn Secrist R. Anselm Pancoast, MD  Pager: [phone]

## 2020-08-13 NOTE — Discharge Instructions (Addendum)
Liver Biopsy, Care After These instructions give you information about how to care for yourself after your procedure. Your health care provider may also give you more specific instructions. If you have problems or questions, contact your health care provider. What can I expect after the procedure? After your procedure, it is common to have:  Pain and soreness in the area where the biopsy was done.  Bruising around the area where the biopsy was done.  Sleepiness and fatigue for 1-2 days. Follow these instructions at home: Medicines  Take over-the-counter and prescription medicines only as told by your health care provider.  If you were prescribed an antibiotic medicine, take it as told by your health care provider. Do not stop taking the antibiotic even if you start to feel better.  Do not take medicines such as aspirin and ibuprofen unless your health care provider tells you to take them. These medicines thin your blood and can increase the risk of bleeding.  If you are taking prescription pain medicine, take actions to prevent or treat constipation. Your health care provider may recommend that you: ? Drink enough fluid to keep your urine pale yellow. ? Eat foods that are high in fiber, such as fresh fruits and vegetables, whole grains, and beans. ? Limit foods that are high in fat and processed sugars, such as fried or sweet foods. ? Take an over-the-counter or prescription medicine for constipation. Incision care  Follow instructions from your health care provider about how to take care of your incision. Make sure you: ? Wash your hands with soap and water before you change your bandage (dressing). If soap and water are not available, use hand sanitizer. ? Remove your dressing as told by your health care provider. In 24 hours  Check your incision area every day for signs of infection. Check for: ? Redness, swelling, or pain. ? Fluid or blood. ? Warmth. ? Pus or a bad smell.  Do  not take baths, swim, or use a hot tub until your health care provider says it is okay to do so. Activity  Rest at home for 1-2 days, or as directed by your health care provider. ? Avoid sitting for a long time without moving. Get up to take short walks every 1-2 hours. This is important to improve blood flow and breathing. Ask for help if you feel weak or unsteady.  Return to your normal activities as told by your health care provider. Ask your health care provider what activities are safe for you.  Do not drive or use heavy machinery while taking prescription pain medicine.  Do not lift anything that is heavier than 10 lb (4.5 kg), or the limit that your health care provider tells you, until he or she says that it is safe.  Do not play contact sports for 2 weeks after the procedure.   General instructions  Do not drink alcohol in the first week after the procedure.  Have someone stay with you for at least 24 hours after the procedure.  It is your responsibility to obtain your test results. Ask your health care provider, or the department that is doing the test: ? When will my results be ready? ? How will I get my results? ? What are my treatment options? ? What other tests do I need? ? What are my next steps?  Keep all follow-up visits as told by your health care provider. This is important.   Contact a health care provider if:  You  have increased bleeding from an incision, resulting in more than a small spot of blood.  You have redness, swelling, or increasing pain in any incisions.  You notice a discharge or a bad smell coming from any of your incisions.  You have a fever or chills. Get help right away if:  You develop swelling, bloating, or pain in your abdomen.  You become dizzy or faint.  You develop a rash.  You have nausea or you vomit.  You faint, or you have shortness of breath or difficulty breathing.  You develop chest pain.  You have problems with your  speech or vision.  You have trouble with your balance or moving your arms or legs. Summary  After the liver biopsy, it is common to have pain, soreness, and bruising in the area, as well as sleepiness and fatigue.  Take over-the-counter and prescription medicines only as told by your health care provider.  Follow instructions from your health care provider about how to care for your incision. Check the incision area daily for signs of infection. This information is not intended to replace advice given to you by your health care provider. Make sure you discuss any questions you have with your health care provider. Document Revised: 05/10/2018 Document Reviewed: 03/27/2017 Elsevier Patient Education  2021 [employer].  Moderate Conscious Sedation, Adult, Care After This sheet gives you information about how to care for yourself after your procedure. Your health care provider may also give you more specific instructions. If you have problems or questions, contact your health care provider. What can I expect after the procedure? After the procedure, it is common to have:  Sleepiness for several hours.  Impaired judgment for several hours.  Difficulty with balance.  Vomiting if you eat too soon. Follow these instructions at home: For the time period you were told by your health care provider: 24 hours  Rest.  Do not participate in activities where you could fall or become injured.  Do not drive or use machinery.  Do not drink alcohol.  Do not take sleeping pills or medicines that cause drowsiness.  Do not make important decisions or sign legal documents.  Do not take care of children on your own.      Eating and drinking  Follow the diet recommended by your health care provider.  Drink enough fluid to keep your urine pale yellow.  If you vomit: ? Drink water, juice, or soup when you can drink without vomiting. ? Make sure you have little or no nausea before eating  solid foods.   General instructions  Take over-the-counter and prescription medicines only as told by your health care provider.  Have a responsible adult stay with you for the time you are told. It is important to have someone help care for you until you are awake and alert.  Do not smoke.  Keep all follow-up visits as told by your health care provider. This is important. Contact a health care provider if:  You are still sleepy or having trouble with balance after 24 hours.  You feel light-headed.  You keep feeling nauseous or you keep vomiting.  You develop a rash.  You have a fever.  You have redness or swelling around the IV site. Get help right away if:  You have trouble breathing.  You have new-onset confusion at home. Summary  After the procedure, it is common to feel sleepy, have impaired judgment, or feel nauseous if you eat too soon.  Rest  after you get home. Know the things you should not do after the procedure.  Follow the diet recommended by your health care provider and drink enough fluid to keep your urine pale yellow.  Get help right away if you have trouble breathing or new-onset confusion at home. This information is not intended to replace advice given to you by your health care provider. Make sure you discuss any questions you have with your health care provider. Document Revised: 07/15/2019 Document Reviewed: 02/10/2019 Elsevier Patient Education  2021 [employer].

## 2020-08-13 NOTE — H&P (Signed)
Chief Complaint: Patient was seen in consultation today for liver lesion biopsy at the request of Danford,Christopher P  Referring Physician(s): Danford,Christopher P  Dr Judeth Horn  Supervising Physician: Markus Daft  Patient Status: [facility] - Out-pt  History of Present Illness: Gwendolyn Williams is a 80 y.o. female   Hx mesenteric thrombosis secondary essential thrombocytosis Hx CAD/HTN and CKD; Hx breast Ca Was in house 08/08/20- 08/10/20 On Xarelto and ASA--- LD 08/10/20 at DC  Admitted with weakness; hypokalemia; dehydration Rt abd pain Came to ED from [place]  CT 08/07/20:  IMPRESSION: 1. Innumerable liver lesions consistent with diffuse metastatic disease. Confluent lesion in the LEFT hepatic lobe is 6.5 centimeters. 2. Cholelithiasis and enhancement of the gallbladder mucosa along with a small amount of pericholecystic fluid raise the question of gallbladder disease. Consider further evaluation gallbladder ultrasound. 3. Previous colon resection with follow-up pelvic anastomosis in the LEFT mid abdomen. 4. Significant stool burden. 5. Minimally enlarged lymph nodes in the periportal region. 6.  Aortic atherosclerosis.  (ICD10-I70.0) 7. No evidence for osseous metastatic disease.  [place] 08/09/20: IMPRESSION: 1. Cholelithiasis. Mild gallbladder wall thickening may be due to cholecystitis or reactive to liver disease. 2. Innumerable liver lesions consistent with metastatic disease.  After DC-- pt has not taken any Xarelto or ASA Scheduled today for liver lesion bx     Past Medical History:  Diagnosis Date  . Arthritis   . Asthma    controlled with singulair  . Breast cancer ([place])    stage I left breast  . Current use of long term anticoagulation 04/21/2017  . Dyspnea, unspecified 02/24/2017   Started coincident with [place], exertional and orthostatic, normal exam. O2 sat 100%.  . Family history of breast cancer   . GERD (gastroesophageal reflux disease)   . H/O  blood clots    clotting disorder since 2010  . Hematoma of arm 08/09/2012  . 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 Hyperlipidemia   . Hypertension   . 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 Pneumonia    viral pneumonia as a kid  . PONV (postoperative nausea and vomiting)     Past Surgical History:  Procedure Laterality Date  . BREAST SURGERY     Left breast lumpectomy sentinel node biopsy  . CERVICAL SPINE SURGERY  05/23/2008  . COLECTOMY  04/11/2008   left colectomy with end colostomy due to clot  . COLOSTOMY TAKEDOWN  08/14/2008  . EYE SURGERY     bil cataracts  . HERNIA REPAIR  2011   [facility]  . INTRAVASCULAR PRESSURE WIRE/FFR STUDY N/A 01/25/2018   Procedure: INTRAVASCULAR PRESSURE WIRE/FFR STUDY;  Surgeon: Nelva Bush, MD;  Location: [place] [facility];  Service: Cardiovascular;  Laterality: N/A;  . LEFT HEART CATH AND CORONARY ANGIOGRAPHY N/A 01/25/2018   Procedure: LEFT HEART CATH AND CORONARY ANGIOGRAPHY;  Surgeon: Nelva Bush, MD;  Location: [place] [facility];  Service: Cardiovascular;  Laterality: N/A;  . LUMBAR [place] SURGERY  2006  . TOTAL HIP ARTHROPLASTY Left 10/07/2016   Procedure: LEFT TOTAL HIP ARTHROPLASTY ANTERIOR APPROACH;  Surgeon: Paralee Cancel, MD;  Location: [facility];  Service: Orthopedics;  Laterality: Left;  70 mins    Allergies: Lipitor [atorvastatin calcium], Penicillins, Sulfa antibiotics, and Tamiflu [oseltamivir phosphate]  Medications: Prior to Admission medications   Medication Sig Start Date End Date Taking? Authorizing Provider  acetaminophen (TYLENOL) 500 MG  tablet Take 500 mg by mouth every 6 (six) hours as needed for moderate pain.   Yes [provider]  amLODipine (NORVASC) 2.5 MG tablet Take 1 tablet (2.5 mg total) by mouth daily. TO BE TAKEN WITH THE 5 MG TABLET TO EQUAL 7.5 MG DAILY 11/22/19 02/20/20 Yes Skeet Latch, MD  Biotin 10 MG  TABS Take 1 tablet by mouth daily.   Yes [provider]  buPROPion (WELLBUTRIN XL) 150 MG 24 hr tablet Take 1 tablet by mouth every morning. 08/02/20  Yes [provider]  Cholecalciferol (VITAMIN D) 2000 units CAPS Take 4,000 Units by mouth daily.    Yes [provider]  Coenzyme Q10 (CO Q-10) 100 MG CAPS Take 1 capsule by mouth daily.   Yes [provider]  dexamethasone (DECADRON) 4 MG tablet Take 1 tablet (4 mg total) by mouth every 12 (twelve) hours. 08/10/20  Yes Danford, Suann Larry, MD  docusate sodium (COLACE) 100 MG capsule Take 1 capsule (100 mg total) by mouth 2 (two) times daily. Patient taking differently: Take 100 mg by mouth daily. 10/07/16  Yes Babish, Rodman Key, PA-C  esomeprazole (NEXIUM) 40 MG capsule Take 40 mg by mouth daily at 6 (six) AM.   Yes [provider]  folic acid (FOLVITE) 1 MG tablet Take 1 mg by mouth daily.   Yes [provider]  ondansetron (ZOFRAN) 4 MG tablet Take 1 tablet (4 mg total) by mouth every 6 (six) hours as needed for nausea. 08/10/20  Yes Danford, Suann Larry, MD  polyethylene glycol (MIRALAX / GLYCOLAX) packet Take 17 g by mouth 2 (two) times daily. Patient taking differently: Take 17 g by mouth daily. 10/07/16  Yes Babish, Rodman Key, PA-C  potassium chloride (KLOR-CON) 8 MEQ tablet Take 1 tablet (8 mEq total) by mouth daily. 08/10/20  Yes Danford, Suann Larry, MD  rivaroxaban (XARELTO) 10 MG TABS tablet Take 1 tablet (10 mg total) by mouth daily. 06/12/20  Yes Brunetta Genera, MD  rosuvastatin (CRESTOR) 40 MG tablet Take 1 tablet (40 mg total) by mouth daily. 11/25/19  Yes Skeet Latch, MD  albuterol (VENTOLIN HFA) 108 (90 Base) MCG/ACT inhaler Inhale 2 puffs into the lungs every 6 (six) hours as needed for wheezing or shortness of breath. 05/23/19   Margaretha Seeds, MD  amLODipine (NORVASC) 2.5 MG tablet Take 2.5 mg by mouth daily.    [provider]  diazepam (VALIUM) 5 MG tablet  Take 5 mg by mouth every 6 (six) hours as needed for anxiety. 04/28/16   [provider]  fluticasone furoate-vilanterol (BREO ELLIPTA) 200-25 MCG/INH AEPB USE 1 INHALATION DAILY 01/23/20   Margaretha Seeds, MD  hydroxyurea (HYDREA) 500 MG capsule TAKE ONE CAPSULE BY MOUTH DAILY WITH FOOD TO MINIMIZE GI SIDE EFFECTS 07/09/20   Brunetta Genera, MD  hyoscyamine (LEVSIN SL) 0.125 MG SL tablet Take 1 to 3 tablets three times daily as needed Patient taking differently: Take 0.125 mg by mouth every 6 (six) hours as needed for cramping. 05/07/20   Milus Banister, MD  Spacer/Aero-Holding Josiah Lobo DEVI 1 Device by Does not apply route as directed. 04/15/18   Margaretha Seeds, MD     Family History  Problem Relation Age of Onset  . Breast cancer Mother 26  . Lung cancer Mother   . Heart disease Father   . Basal cell carcinoma Father        x2  . CAD Father   . Ovarian cysts Sister   .  Dementia Sister   . Breast cancer Maternal Aunt 73  . Breast cancer Paternal Aunt 82  . Ovarian cysts Maternal Grandmother        possibly had ovarian cancer as well  . Breast cancer Maternal Aunt 29  . Cancer Other        either pancreatic or colon  . Other Daughter 24       breast lumpectomy- complex sclerosing lesion  . Polycystic ovary syndrome Daughter   . Bladder Cancer Cousin     Social History   Socioeconomic History  . Marital status: Married    Spouse name: Not on file  . Number of children: Not on file  . Years of education: Not on file  . Highest education level: Not on file  Occupational History  . Not on file  Tobacco Use  . Smoking status: Former Smoker    Packs/day: 1.00    Years: 5.00    Pack years: 5.00    Start date: 53    Quit date: 1964    Years since quitting: 58.4  . Smokeless tobacco: Never Used  Vaping Use  . Vaping Use: Never used  Substance and Sexual Activity  . Alcohol use: Yes    Alcohol/week: 0.0 standard drinks    Comment: Rare  . Drug use: No   . Sexual activity: Not Currently    Partners: Male    Comment: 1st intercourse 80 yo-Fewer than 5 partners  Other Topics Concern  . Not on file  Social History Narrative  . Not on file   Social Determinants of Health   Financial Resource Strain: Not on file  Food Insecurity: Not on file  Transportation Needs: Not on file  Physical Activity: Not on file  Stress: Not on file  Social Connections: Not on file    Review of Systems: A 12 point ROS discussed and pertinent positives are indicated in the HPI above.  All other systems are negative.  Review of Systems  Constitutional: Positive for activity change, appetite change and fatigue. Negative for fever.  Respiratory: Negative for cough and shortness of breath.   Cardiovascular: Negative for chest pain.  Gastrointestinal: Positive for abdominal pain and nausea.  Musculoskeletal: Negative for back pain.  Neurological: Positive for weakness.  Psychiatric/Behavioral: Negative for behavioral problems and confusion.    Vital Signs: BP (!) 144/100   Pulse 85   Temp 97.7 F (36.5 C) (Oral)   Resp 18   Ht 4\' 11"  (1.499 m)   Wt 92 lb (41.7 kg)   SpO2 99%   BMI 18.58 kg/m   Physical Exam Vitals reviewed.  HENT:     Mouth/Throat:     Mouth: Mucous membranes are moist.  Cardiovascular:     Rate and Rhythm: Normal rate and regular rhythm.     Heart sounds: Normal heart sounds.  Pulmonary:     Effort: Pulmonary effort is normal.     Breath sounds: Normal breath sounds.  Abdominal:     Tenderness: There is abdominal tenderness.  Musculoskeletal:        General: Normal range of motion.  Skin:    General: Skin is warm.  Neurological:     Mental Status: She is alert and oriented to person, place, and time.  Psychiatric:        Behavior: Behavior normal.     Imaging: MR Brain W Wo Contrast  Result Date: 08/11/2020 CLINICAL DATA:  Staging cancer of unknown primary. EXAM: MRI HEAD WITHOUT AND  WITH CONTRAST TECHNIQUE:  Multiplanar, multiecho pulse sequences of the brain and surrounding structures were obtained without and with intravenous contrast. CONTRAST:  22mL GADAVIST GADOBUTROL 1 MMOL/ML IV SOLN COMPARISON:  Brain MRI 06/22/2013 FINDINGS: Brain: No enhancement or swelling to suggest metastatic disease. No infarct, hemorrhage, hydrocephalus, or collection. Age normal brain volume and white matter appearance. Vascular: Normal flow voids. Skull and upper cervical spine: Multilevel cervical spine degeneration. Retro dental ligamentous thickening without cervicomedullary compression. Sinuses/Orbits: Bilateral cataract resection. No pathologic finding. IMPRESSION: No evidence of metastatic disease. Electronically Signed   By: Marnee Spring M.D.   On: 08/11/2020 08:24   CT CHEST ABDOMEN PELVIS W CONTRAST  Result Date: 08/07/2020 CLINICAL DATA:  3 pound weight loss over the past few weeks. Abdominal distention and nausea. RIGHT LOWER QUADRANT swelling. History of LEFT breast cancer with lumpectomy. History of colon resection. History of smoking. EXAM: CT CHEST, ABDOMEN, AND PELVIS WITH CONTRAST TECHNIQUE: Multidetector CT imaging of the chest, abdomen and pelvis was performed following the standard protocol during bolus administration of intravenous contrast. CONTRAST:  107mL ISOVUE-300 IOPAMIDOL (ISOVUE-300) INJECTION 61% COMPARISON:  02/12/2018 and 11/20/2016 FINDINGS: CT CHEST FINDINGS Cardiovascular: Heart size is normal. There is atherosclerotic calcification of the arteries. There is atherosclerotic calcification of the thoracic aorta not associated with aneurysm. No pericardial effusion. Pulmonary arteries are normally opacified account for the contrast bolus timing. Mediastinum/Nodes: Esophagus is normal. No mediastinal, hilar, or axillary adenopathy. A 5 millimeter nodule is identified within the LEFT lobe of the thyroid gland. Otherwise the thyroid is normal in appearance. Lungs/Pleura: There are emphysematous changes  throughout the lungs. No pulmonary nodules, consolidations, or pleural effusions. There is minimal RIGHT apical scarring. Musculoskeletal: Remote LOWER cervical spine fusion. Minimal degenerative changes in the midthoracic spine. No acute fracture or subluxation. No suspicious lytic or blastic lesions. CT ABDOMEN PELVIS FINDINGS Hepatobiliary: Liver size is UPPER limits normal. The liver contains innumerable rim enhancing lesions throughout all lobes. Confluent low-attenuation is identified within the anterior aspect of the RIGHT hepatic lobe. Small amount of perihepatic fluid is present. The largest, confluent lesion measures approximately 6.5 x 4.5 centimeters. Other more discrete rounded lesions measure from 6 millimeters to 3.0 centimeters. There is a small amount of pericholecystic fluid. Calcified gallstone measures at least 1.6 centimeters. There is enhancement of the mucosa of the gallbladder. Consider further evaluation with gallbladder ultrasound. Pancreas: Unremarkable. No pancreatic ductal dilatation or surrounding inflammatory changes. Spleen: Normal in size without focal abnormality. Adrenals/Urinary Tract: Adrenal glands are normal in appearance. A 1 centimeter simple cyst is identified in UPPER pole of the LEFT kidney. There is symmetric bilateral renal enhancement and early excretion of contrast. The ureters are unremarkable. Distal aspects of the ureters are partially obscured by a over lying artifact. The urinary bladder is otherwise normal in appearance. Stomach/Bowel: The stomach and small bowel loops are normal in appearance. Small bowel loops are normal in appearance. The appendix is not well seen. Central pelvic small bowel loops are partially obscured by metal artifact in the LEFT hip. Colo-colic anastomosis is identified in the LEFT mid abdomen and is distended with a significant amount of stool. No obstructing mass identified. Vascular/Lymphatic: There is atherosclerotic calcification of  the abdominal aorta. Portacaval lymph node is 1.1 centimeters. Unknown along the dorsum of the pancreatic neck is 1.1 centimeters. Although involved by atherosclerosis, there is vascular opacification of the celiac axis, superior mesenteric artery, and inferior mesenteric artery. Normal appearance of the portal venous system and inferior vena cava.  Reproductive: The uterus is present.  No adnexal mass. Other: No free pelvic fluid. Anterior abdominal wall is unremarkable. Musculoskeletal: Degenerative changes are seen in the lumbar spine. No lytic or blastic lesions are noted. Previous LEFT hip arthroplasty. IMPRESSION: 1. Innumerable liver lesions consistent with diffuse metastatic disease. Confluent lesion in the LEFT hepatic lobe is 6.5 centimeters. 2. Cholelithiasis and enhancement of the gallbladder mucosa along with a small amount of pericholecystic fluid raise the question of gallbladder disease. Consider further evaluation gallbladder ultrasound. 3. Previous colon resection with follow-up pelvic anastomosis in the LEFT mid abdomen. 4. Significant stool burden. 5. Minimally enlarged lymph nodes in the periportal region. 6.  Aortic atherosclerosis.  (ICD10-I70.0) 7. No evidence for osseous metastatic disease. 8. Aortic atherosclerosis.  (ICD10-I70.0) 9.  Emphysema (ICD10-J43.9). These results will be called to the ordering clinician or representative by the Radiologist Assistant, and communication documented in the PACS or [employer]. Electronically Signed   By: Nolon Nations M.D.   On: 08/07/2020 13:20   US Abdomen Limited RUQ (LIVER/GB)  Result Date: 08/09/2020 CLINICAL DATA:  Cholelithiasis Breast cancer EXAM: ULTRASOUND ABDOMEN LIMITED RIGHT UPPER QUADRANT COMPARISON:  CT chest, abdomen, and pelvis 08/07/2020 FINDINGS: Gallbladder: Sludge and stones noted within the gallbladder lumen. Mild thickening of the gallbladder wall. Sonographic Murphy sign negative per technologist. No pericholecystic  fluid. Common bile duct: Diameter: 3 mm Liver: Nodular hepatic contours. Numerous hepatic masses again seen, most likely due to metastatic disease. Portal vein is patent on color Doppler imaging with normal direction of blood flow towards the liver. Other: None. IMPRESSION: 1. Cholelithiasis. Mild gallbladder wall thickening may be due to cholecystitis or reactive to liver disease. 2. Innumerable liver lesions consistent with metastatic disease. Electronically Signed   By: Miachel Roux M.D.   On: 08/09/2020 07:56    Labs:  CBC: Recent Labs    03/07/20 1104 06/12/20 1347 08/08/20 1052 08/09/20 0500  WBC 8.9 8.9 12.2* 8.2  HGB 12.7 12.1 14.4 11.7*  HCT 41.0 40.2 47.1* 38.8  PLT 426* 358 457* 263    COAGS: Recent Labs    08/08/20 1410 08/09/20 0500  INR 2.3* 1.3*    BMP: Recent Labs    10/21/19 1124 10/21/19 1124 11/22/19 1020 03/05/20 0947 03/07/20 1104 06/12/20 1347 08/08/20 1052 08/09/20 0500 08/10/20 0542  NA 141   < > 141 142   < > 139 135 135 139  K 3.3*  --  3.7 3.6   < > 3.7 2.4* 3.5 4.0  CL 103  --  101 104   < > 105 94* 105 105  CO2 28  --  24 22   < > 25 27 22 27   GLUCOSE 85   < > 75 81   < > 85 95 67* 172*  BUN 20   < > 21 17   < > 18 29* 24* 24*  CALCIUM 9.8  --  9.5 9.6   < > 8.9 10.3 8.9 9.0  CREATININE 0.83   < > 0.90 0.94   < > 1.03* 1.85* 1.53* 1.46*  GFRNONAA >60   < > 62 59*   < > 56* 28* 35* 37*  GFRAA >60  --  72 68  --   --   --   --   --    < > = values in this interval not displayed.    LIVER FUNCTION TESTS: Recent Labs    03/07/20 1104 06/12/20 1347 08/08/20 1052 08/09/20 0500  BILITOT  0.5 0.6 1.0 1.2  AST 29 53* 109* 81*  ALT 25 37 70* 47*  ALKPHOS 69 87 222* 125  PROT 7.0 6.6 7.4 5.6*  ALBUMIN 3.8 3.6 3.7 3.0*    TUMOR MARKERS: No results for input(s): AFPTM, CEA, CA199, CHROMGRNA in the last 8760 hours.  Assessment and Plan:  Liver lesion biopsy Risks and benefits of liver lesion bx was discussed with the patient and/or  patient's family including, but not limited to bleeding, infection, damage to adjacent structures or low yield requiring additional tests.  All of the questions were answered and there is agreement to proceed.  Consent signed and in chart.   Thank you for this interesting consult.  I greatly enjoyed meeting RUT BETTERTON and look forward to participating in their care.  A copy of this report was sent to the requesting provider on this date.  Electronically Signed: Lavonia Drafts, PA-C 08/13/2020, 12:17 PM   I spent a total of  30 Minutes   in face to face in clinical consultation, greater than 50% of which was counseling/coordinating care for liver lesion bx

## 2020-08-14 ENCOUNTER — Ambulatory Visit (HOSPITAL_COMMUNITY)
Admission: RE | Admit: 2020-08-14 | Discharge: 2020-08-14 | Disposition: A | Discharge status: Home / Self Care | Payer: Medicare Other | Source: Ambulatory Visit | Attending: Hematology | Admitting: Hematology

## 2020-08-14 ENCOUNTER — Other Ambulatory Visit (HOSPITAL_COMMUNITY): Payer: Self-pay

## 2020-08-14 DIAGNOSIS — I700 Atherosclerosis of aorta: Secondary | ICD-10-CM | POA: Diagnosis not present

## 2020-08-14 DIAGNOSIS — C801 Malignant (primary) neoplasm, unspecified: Secondary | ICD-10-CM | POA: Insufficient documentation

## 2020-08-14 DIAGNOSIS — C787 Secondary malignant neoplasm of liver and intrahepatic bile duct: Secondary | ICD-10-CM | POA: Insufficient documentation

## 2020-08-14 DIAGNOSIS — K8020 Calculus of gallbladder without cholecystitis without obstruction: Secondary | ICD-10-CM | POA: Diagnosis not present

## 2020-08-14 LAB — GLUCOSE, CAPILLARY: Glucose-Capillary: 108 mg/dL — ABNORMAL HIGH (ref 70–99)

## 2020-08-14 MED ORDER — FLUDEOXYGLUCOSE F - 18 (FDG) INJECTION
5.0000 | Freq: Once | INTRAVENOUS | Status: AC | PRN
  Administered 2020-08-14: 5.06 via INTRAVENOUS

## 2020-08-15 ENCOUNTER — Other Ambulatory Visit: Payer: Self-pay | Admitting: *Deleted

## 2020-08-15 ENCOUNTER — Ambulatory Visit
Admission: RE | Admit: 2020-08-15 | Discharge: 2020-08-15 | Disposition: A | Discharge status: Home / Self Care | Payer: Medicare Other | Source: Ambulatory Visit | Attending: Hematology | Admitting: Hematology

## 2020-08-15 ENCOUNTER — Other Ambulatory Visit: Payer: Self-pay

## 2020-08-15 ENCOUNTER — Telehealth: Payer: Self-pay | Admitting: *Deleted

## 2020-08-15 DIAGNOSIS — C787 Secondary malignant neoplasm of liver and intrahepatic bile duct: Secondary | ICD-10-CM

## 2020-08-15 DIAGNOSIS — C801 Malignant (primary) neoplasm, unspecified: Secondary | ICD-10-CM

## 2020-08-15 NOTE — Progress Notes (Signed)
HEMATOLOGY/ONCOLOGY CLINIC NOTE  Date of Service: 08/15/2020  Patient Care Team: Gaynelle Arabian, MD as PCP - General (Family Medicine) Skeet Latch, MD as PCP - Cardiology (Cardiology) Annia Belt, MD as Consulting Physician (Oncology) Rolm Bookbinder, MD as Consulting Physician (General Surgery)  CHIEF COMPLAINTS/PURPOSE OF CONSULTATION:  Essential Thrombocytosis Newly metastatic breast cancer  HISTORY OF PRESENTING ILLNESS:   Gwendolyn Williams is a wonderful 80 y.o. female who has been referred to [place] by Dr. Gaynelle Arabian, and was previously under the care of Dr. 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 The pt reports that she is doing well overall.   The pt reports that she has seen Dr. Michail Sermon in GI and is concerned for esophageal spasm but does not wish to proceed with the diagnostic work up with esophageal manometry. She notes that muscle relaxants help with this occasional pain. She also follows up with Dr. Donne Hazel in surgery to determine if this related to her previous abdominal surgeries. 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  The pt notes that she does not have any other present concerns. She notes that she had SOB at the end of last year, as part of bronchiectasis, and takes Memory Dance once a day, denies needing her rescue inhaler. She denies positional elements to her SOB.  The pt notes that she was off Hyxdroxyurea during the Fall of 2019 during this work up. She then returned to 534m Hydoxyurea once a day and her PLT have normalized. She takes this in the morning with her food and denies any problems tolerating this medication.   The pt denies any concerns for bleeding at this time.  The pt continues with annual mammograms with her OBGYN Dr. TDonalynn Furlong The pt pursued maintenance Arimidex for 8.5 years,  and stopped during the evaluation of her respiratory symptoms in Fall 2019. The pt completes a bone density study every year, and notes she has stable osteopenia. She takes Vitamin D replacement and notes her levels are good.  She continues on 260mXarelto. She continues on 8128mspirin as well.  The pt notes that she has had chronic mouth ulcers for "decades." She has tried Vitamin B complex and mouthwashes. She notes that she routinely gets mouth ulcers after her dental cleanings as well.  Most recent lab results (06/22/18) of CBC w/diff and CMP is as follows: all values are WNL except for RDW at 25.2, Potassium at 3.3, BUN at 30, Creatinine at 1.23, GFR at 43.  On review of systems, pt reports intermittent esophageal discomfort, stable breathing, good energy levels, some abdominal tenderness, chronic mouth ulcers, and denies concerns for bleeding, and any other symptoms.  Interval History:   Gwendolyn Williams today for management and evaluation of newly diagnosed liver lesions concerning for liver metastases. The patient's last visit with us [place] on 08/08/2020. The pt reports that she is doing well overall. We are joined today by her husband.  The pt reports no new symptoms since the last visit and hospitalization. The pt is eating slightly better, but still gets full very quickly. She can eat 3/4 of a sandwich currently. She notes that she has not seen a nutritional therapist. The pt notes that even half of a Valium has helped her with sleeping and being able to control her thoughts at night.  Of note since the  patient's last visit, pt has had PET Skull base to Thigh ([id number]) on 08/14/2020, which revealed "Diffuse hepatic metastatic disease with morphologic features that would favor breast cancer metastasis. 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 Correlation with recent biopsy results may be helpful.  Signs of nodal and skeletal  metastases.  Cholelithiasis. Gallbladder is contracted on today's study with mild thickening and adjacent fluid, nonspecific in the setting of diffuse hepatic metastatic disease. Contracted appearance of the gallbladder would argue against acute biliary process related to the gallbladder.  Aortic Atherosclerosis (ICD10-I70.0)."  The pt has also had US Biopsy Liver on 08/13/2020, which revealed "A. LIVER, RIGHT LOBE, NEEDLE CORE BIOPSY:  - Adenocarcinoma, see comment.   COMMENT:   The history and morphology are consistent with metastatic mammary  carcinoma. Prognostic markers will be ordered and reported in an  addendum. "  The pt has also had MR Brain ([id number]) on 08/11/2020, which revealed "No evidence of metastatic disease."  Lab results today 08/16/2020 of CBC w/diff and CMP is as follows: all values are WNL except for Glucose of 110, BUN of 28, Creatinine of 1.26, Total Protein of 6.4, Albumin of 3.2, AST of 135, ALT of 97, Alkaline Phosphatase of 212, GFR est of 44.  On review of systems, pt reports continued nausea, imbalance, and denies acute lower back pain, difficulty sleeping, leg swelling, and any other symptoms.  MEDICAL HISTORY:  Past Medical History:  Diagnosis Date  . Arthritis   . Asthma    controlled with singulair  . Breast cancer ([place])    stage I left breast  . Current use of long term anticoagulation 04/21/2017  . Dyspnea, unspecified 02/24/2017   Started coincident with Hydrea, exertional and orthostatic, normal exam. O2 sat 100%.  . Family history of breast cancer   . GERD (gastroesophageal reflux disease)   . H/O blood clots    clotting disorder since 2010  . Hematoma of arm 08/09/2012  . 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 Hyperlipidemia   . Hypertension   . 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 Pneumonia    viral pneumonia as a kid  . PONV  (postoperative nausea and vomiting)     SURGICAL HISTORY: Past Surgical History:  Procedure Laterality Date  . BREAST SURGERY     Left breast lumpectomy sentinel node biopsy  . CERVICAL SPINE SURGERY  05/23/2008  . COLECTOMY  04/11/2008   left colectomy with end colostomy due to clot  . COLOSTOMY TAKEDOWN  08/14/2008  . EYE SURGERY     bil cataracts  . HERNIA REPAIR  2011   [facility]  . INTRAVASCULAR PRESSURE WIRE/FFR STUDY N/A 01/25/2018   Procedure: INTRAVASCULAR PRESSURE WIRE/FFR STUDY;  Surgeon: Nelva Bush, MD;  Location: [facility];  Service: Cardiovascular;  Laterality: N/A;  . LEFT HEART CATH AND CORONARY ANGIOGRAPHY N/A 01/25/2018   Procedure: LEFT HEART CATH AND CORONARY ANGIOGRAPHY;  Surgeon: Nelva Bush, MD;  Location: [facility];  Service: Cardiovascular;  Laterality: N/A;  . LUMBAR [place] SURGERY  2006  . TOTAL HIP ARTHROPLASTY Left 10/07/2016   Procedure: LEFT TOTAL HIP ARTHROPLASTY ANTERIOR APPROACH;  Surgeon: Paralee Cancel, MD;  Location: [facility];  Service: Orthopedics;  Laterality: Left;  70 mins    SOCIAL HISTORY: Social History   Socioeconomic History  . Marital status: Married    Spouse name: Not  on file  . Number of children: Not on file  . Years of education: Not on file  . Highest education level: Not on file  Occupational History  . Not on file  Tobacco Use  . Smoking status: Former Smoker    Packs/day: 1.00    Years: 5.00    Pack years: 5.00    Start date: 64    Quit date: 1964    Years since quitting: 58.4  . Smokeless tobacco: Never Used  Vaping Use  . Vaping Use: Never used  Substance and Sexual Activity  . Alcohol use: Yes    Alcohol/week: 0.0 standard drinks    Comment: Rare  . Drug use: No  . Sexual activity: Not Currently    Partners: Male    Comment: 1st intercourse 80 yo-Fewer than 5 partners  Other Topics Concern  . Not on file  Social History Narrative  . Not on file   Social Determinants of Health    Financial Resource Strain: Not on file  Food Insecurity: Not on file  Transportation Needs: Not on file  Physical Activity: Not on file  Stress: Not on file  Social Connections: Not on file  Intimate Partner Violence: Not on file    FAMILY HISTORY: Family History  Problem Relation Age of Onset  . Breast cancer Mother 22  . Lung cancer Mother   . Heart disease Father   . Basal cell carcinoma Father        x2  . CAD Father   . Ovarian cysts Sister   . Dementia Sister   . Breast cancer Maternal Aunt 76  . Breast cancer Paternal Aunt 55  . Ovarian cysts Maternal Grandmother        possibly had ovarian cancer as well  . Breast cancer Maternal Aunt 18  . Cancer Other        either pancreatic or colon  . Other Daughter 57       breast lumpectomy- complex sclerosing lesion  . Polycystic ovary syndrome Daughter   . Bladder Cancer Cousin     ALLERGIES:  is allergic to lipitor [atorvastatin calcium], penicillins, sulfa antibiotics, and tamiflu [oseltamivir phosphate].  MEDICATIONS:  Current Outpatient Medications  Medication Sig Dispense Refill  . acetaminophen (TYLENOL) 500 MG tablet Take 500 mg by mouth every 6 (six) hours as needed for moderate pain.    Marland Kitchen albuterol (VENTOLIN HFA) 108 (90 Base) MCG/ACT inhaler Inhale 2 puffs into the lungs every 6 (six) hours as needed for wheezing or shortness of breath. 18 g 3  . amLODipine (NORVASC) 2.5 MG tablet Take 1 tablet (2.5 mg total) by mouth daily. TO BE TAKEN WITH THE 5 MG TABLET TO EQUAL 7.5 MG DAILY 90 tablet 3  . amLODipine (NORVASC) 2.5 MG tablet Take 2.5 mg by mouth daily.    . Biotin 10 MG TABS Take 1 tablet by mouth daily.    Marland Kitchen buPROPion (WELLBUTRIN XL) 150 MG 24 hr tablet Take 1 tablet by mouth every morning.    . Cholecalciferol (VITAMIN D) 2000 units CAPS Take 4,000 Units by mouth daily.     . Coenzyme Q10 (CO Q-10) 100 MG CAPS Take 1 capsule by mouth daily.    Marland Kitchen dexamethasone (DECADRON) 4 MG tablet Take 1 tablet (4 mg  total) by mouth every 12 (twelve) hours. 60 tablet 3  . diazepam (VALIUM) 5 MG tablet Take 5 mg by mouth every 6 (six) hours as needed for anxiety.    Marland Kitchen  docusate sodium (COLACE) 100 MG capsule Take 1 capsule (100 mg total) by mouth 2 (two) times daily. (Patient taking differently: Take 100 mg by mouth daily.) 10 capsule 0  . esomeprazole (NEXIUM) 40 MG capsule Take 40 mg by mouth daily at 6 (six) AM.    . fluticasone furoate-vilanterol (BREO ELLIPTA) 200-25 MCG/INH AEPB USE 1 INHALATION DAILY 102 each 3  . folic acid (FOLVITE) 1 MG tablet Take 1 mg by mouth daily.    . hydroxyurea (HYDREA) 500 MG capsule TAKE ONE CAPSULE BY MOUTH DAILY WITH FOOD TO MINIMIZE GI SIDE EFFECTS 90 capsule 0  . hyoscyamine (LEVSIN SL) 0.125 MG SL tablet Take 1 to 3 tablets three times daily as needed (Patient taking differently: Take 0.125 mg by mouth every 6 (six) hours as needed for cramping.) 90 tablet 11  . ondansetron (ZOFRAN) 4 MG tablet Take 1 tablet (4 mg total) by mouth every 6 (six) hours as needed for nausea. 30 tablet 3  . polyethylene glycol (MIRALAX / GLYCOLAX) packet Take 17 g by mouth 2 (two) times daily. (Patient taking differently: Take 17 g by mouth daily.) 14 each 0  . potassium chloride (KLOR-CON) 8 MEQ tablet Take 1 tablet (8 mEq total) by mouth daily. 30 tablet 3  . rivaroxaban (XARELTO) 10 MG TABS tablet Take 1 tablet (10 mg total) by mouth daily. 30 tablet 3  . rosuvastatin (CRESTOR) 40 MG tablet Take 1 tablet (40 mg total) by mouth daily. 90 tablet 3  . Spacer/Aero-Holding Chambers DEVI 1 Device by Does not apply route as directed. 1 each 0   No current facility-administered medications for this visit.    REVIEW OF SYSTEMS:   10 Point review of Systems was done is negative except as noted above.  PHYSICAL EXAMINATION: ECOG FS:2 - Symptomatic, <50% confined to bed  There were no vitals filed for this visit. Wt Readings from Last 3 Encounters:  08/13/20 92 lb (41.7 kg)  08/08/20 89 lb 4.8  oz (40.5 kg)  06/12/20 95 lb 9.6 oz (43.4 kg)   Body mass index is 18.86 kg/m.      GENERAL:alert, in no acute distress and comfortable SKIN: no acute rashes, no significant lesions EYES: conjunctiva are pink and non-injected, sclera anicteric OROPHARYNX: MMM, no exudates, no oropharyngeal erythema or ulceration NECK: supple, no JVD LYMPH:  no palpable lymphadenopathy in the cervical, axillary or inguinal regions LUNGS: clear to auscultation b/l with normal respiratory effort HEART: regular rate & rhythm ABDOMEN:  normoactive bowel sounds , non tender, not distended. Extremity: no pedal edema PSYCH: alert & oriented x 3 with fluent speech NEURO: no focal motor/sensory deficits  Provided routine breast exam. Tenderness noted on area with scar tissue.  LABORATORY DATA:  I have reviewed the data as listed  . CBC Latest Ref Rng & Units 08/16/2020 08/13/2020 08/09/2020  WBC 4.0 - 10.5 K/uL 14.3(H) 11.3(H) 8.2  Hemoglobin 12.0 - 15.0 g/dL 13.4 13.1 11.7(L)  Hematocrit 36.0 - 46.0 % 43.3 42.4 38.8  Platelets 150 - 400 K/uL 353 266 263    . CMP Latest Ref Rng & Units 08/16/2020 08/10/2020 08/09/2020  Glucose 70 - 99 mg/dL 110(H) 172(H) 67(L)  BUN 8 - 23 mg/dL 28(H) 24(H) 24(H)  Creatinine 0.44 - 1.00 mg/dL 1.26(H) 1.46(H) 1.53(H)  Sodium 135 - 145 mmol/L 140 139 135  Potassium 3.5 - 5.1 mmol/L 4.0 4.0 3.5  Chloride 98 - 111 mmol/L 102 105 105  CO2 22 - 32 mmol/L 26 27 22  Calcium 8.9 - 10.3 mg/dL 9.2 9.0 8.9  Total Protein 6.5 - 8.1 g/dL 6.4(L) - 5.6(L)  Total Bilirubin 0.3 - 1.2 mg/dL 0.6 - 1.2  Alkaline Phos 38 - 126 U/L 212(H) - 125  AST 15 - 41 U/L 135(H) - 81(H)  ALT 0 - 44 U/L 97(H) - 47(H)   06/07/08 JAK2:    RADIOGRAPHIC STUDIES: I have personally reviewed the radiological images as listed and agreed with the findings in the report. MR Brain W Wo Contrast  Result Date: 08/11/2020 CLINICAL DATA:  Staging cancer of unknown primary. EXAM: MRI HEAD WITHOUT AND WITH  CONTRAST TECHNIQUE: Multiplanar, multiecho pulse sequences of the brain and surrounding structures were obtained without and with intravenous contrast. CONTRAST:  41m GADAVIST GADOBUTROL 1 MMOL/ML IV SOLN COMPARISON:  Brain MRI 06/22/2013 FINDINGS: Brain: No enhancement or swelling to suggest metastatic disease. No infarct, hemorrhage, hydrocephalus, or collection. Age normal brain volume and white matter appearance. Vascular: Normal flow voids. Skull and upper cervical spine: Multilevel cervical spine degeneration. Retro dental ligamentous thickening without cervicomedullary compression. Sinuses/Orbits: Bilateral cataract resection. No pathologic finding. IMPRESSION: No evidence of metastatic disease. Electronically Signed   By: JMonte FantasiaM.D.   On: 08/11/2020 08:24   CT CHEST ABDOMEN PELVIS W CONTRAST  Result Date: 08/07/2020 CLINICAL DATA:  3 pound weight loss over the past few weeks. Abdominal distention and nausea. RIGHT LOWER QUADRANT swelling. History of LEFT breast cancer with lumpectomy. History of colon resection. History of smoking. EXAM: CT CHEST, ABDOMEN, AND PELVIS WITH CONTRAST TECHNIQUE: Multidetector CT imaging of the chest, abdomen and pelvis was performed following the standard protocol during bolus administration of intravenous contrast. CONTRAST:  832mISOVUE-300 IOPAMIDOL (ISOVUE-300) INJECTION 61% COMPARISON:  02/12/2018 and 11/20/2016 FINDINGS: CT CHEST FINDINGS Cardiovascular: Heart size is normal. There is atherosclerotic calcification of the arteries. There is atherosclerotic calcification of the thoracic aorta not associated with aneurysm. No pericardial effusion. Pulmonary arteries are normally opacified account for the contrast bolus timing. Mediastinum/Nodes: Esophagus is normal. No mediastinal, hilar, or axillary adenopathy. A 5 millimeter nodule is identified within the LEFT lobe of the thyroid gland. Otherwise the thyroid is normal in appearance. Lungs/Pleura: There are  emphysematous changes throughout the lungs. No pulmonary nodules, consolidations, or pleural effusions. There is minimal RIGHT apical scarring. Musculoskeletal: Remote LOWER cervical spine fusion. Minimal degenerative changes in the midthoracic spine. No acute fracture or subluxation. No suspicious lytic or blastic lesions. CT ABDOMEN PELVIS FINDINGS Hepatobiliary: Liver size is UPPER limits normal. The liver contains innumerable rim enhancing lesions throughout all lobes. Confluent low-attenuation is identified within the anterior aspect of the RIGHT hepatic lobe. Small amount of perihepatic fluid is present. The largest, confluent lesion measures approximately 6.5 x 4.5 centimeters. Other more discrete rounded lesions measure from 6 millimeters to 3.0 centimeters. There is a small amount of pericholecystic fluid. Calcified gallstone measures at least 1.6 centimeters. There is enhancement of the mucosa of the gallbladder. Consider further evaluation with gallbladder ultrasound. Pancreas: Unremarkable. No pancreatic ductal dilatation or surrounding inflammatory changes. Spleen: Normal in size without focal abnormality. Adrenals/Urinary Tract: Adrenal glands are normal in appearance. A 1 centimeter simple cyst is identified in UPPER pole of the LEFT kidney. There is symmetric bilateral renal enhancement and early excretion of contrast. The ureters are unremarkable. Distal aspects of the ureters are partially obscured by a over lying artifact. The urinary bladder is otherwise normal in appearance. Stomach/Bowel: The stomach and small bowel loops are normal in appearance. Small bowel loops  are normal in appearance. The appendix is not well seen. Central pelvic small bowel loops are partially obscured by metal artifact in the LEFT hip. Colo-colic anastomosis is identified in the LEFT mid abdomen and is distended with a significant amount of stool. No obstructing mass identified. Vascular/Lymphatic: There is  atherosclerotic calcification of the abdominal aorta. Portacaval lymph node is 1.1 centimeters. Unknown along the dorsum of the pancreatic neck is 1.1 centimeters. Although involved by atherosclerosis, there is vascular opacification of the celiac axis, superior mesenteric artery, and inferior mesenteric artery. Normal appearance of the portal venous system and inferior vena cava. Reproductive: The uterus is present.  No adnexal mass. Other: No free pelvic fluid. Anterior abdominal wall is unremarkable. Musculoskeletal: Degenerative changes are seen in the lumbar spine. No lytic or blastic lesions are noted. Previous LEFT hip arthroplasty. IMPRESSION: 1. Innumerable liver lesions consistent with diffuse metastatic disease. Confluent lesion in the LEFT hepatic lobe is 6.5 centimeters. 2. Cholelithiasis and enhancement of the gallbladder mucosa along with a small amount of pericholecystic fluid raise the question of gallbladder disease. Consider further evaluation gallbladder ultrasound. 3. Previous colon resection with follow-up pelvic anastomosis in the LEFT mid abdomen. 4. Significant stool burden. 5. Minimally enlarged lymph nodes in the periportal region. 6.  Aortic atherosclerosis.  (ICD10-I70.0) 7. No evidence for osseous metastatic disease. 8. Aortic atherosclerosis.  (ICD10-I70.0) 9.  Emphysema (ICD10-J43.9). These results will be called to the ordering clinician or representative by the Radiologist Assistant, and communication documented in the PACS or [employer]. Electronically Signed   By: Nolon Nations M.D.   On: 08/07/2020 13:20   NM PET Image Initial (PI) Skull Base To Thigh  Result Date: 08/14/2020 CLINICAL DATA:  Initial treatment strategy for cancer of unknown primary. EXAM: NUCLEAR MEDICINE PET SKULL BASE TO THIGH TECHNIQUE: 5.06 mCi F-18 FDG was injected intravenously. Full-ring PET imaging was performed from the skull base to thigh after the radiotracer. CT data was obtained and used  for attenuation correction and anatomic localization. Fasting blood glucose: 108 mg/dl COMPARISON:  Chest abdomen pelvis CT from Aug 07, 2020. FINDINGS: Mediastinal blood pool activity: SUV max 1.74 Liver activity: SUV max NA NECK: LEFT level IV a lymph node at the thoracic inlet (image 41/4) 7 mm short axis with a maximum SUV of 4.7 no additional signs of adenopathy in the neck. Incidental CT findings: none CHEST: No hypermetabolic mediastinal or hilar nodes. No suspicious pulmonary nodules on the CT scan. Incidental CT findings: Aortic atherosclerosis. Normal heart size. Three-vessel coronary artery disease. No substantial pericardial fluid. Normal caliber central pulmonary vessels. Limited assessment of cardiovascular structures given lack of intravenous contrast. Mild biapical pleural and parenchymal scarring. Mild basilar atelectasis. No suspicious nodule, consolidation or substantial pleural effusion. Airways are patent. ABDOMEN/PELVIS: Diffuse hepatic metastatic disease, largest area in the dome of the RIGHT hemi liver measuring in total near 10 x 6 cm on image 90 of series 4 and as much is 11 x 6 cm on image 94 of series 4. All areas, subsegments of the liver are involved. Maximum SUV obtained in the liver in the dominant lesion is 8.7 Celiac adenopathy better seen on the previous CT (image 100/4) short axis measurement 11 mm with a maximum SUV of 6.2 Mild increased FDG uptake in the intra-aortocaval groove and along the LEFT para aortic chain (image 105/4) 9 mm lymph node with a maximum SUV of 6.6. No additional solid organ or nodal uptake in the abdomen. RIGHT groin lymph node with  a maximum SUV of 5.4 on image 151 of series 4 measuring approximately 6 mm short axis Incidental CT findings: Cholelithiasis. Trace ascites. Morphologic features of capsular retraction with respect to the dominant liver met. No acute findings relative to pancreas, spleen, adrenal glands and kidneys. Trace ascites in the pelvis.  No acute gastrointestinal process. Signs of partial colonic resection in the LEFT hemiabdomen with anastomosis, at the level of the anastomosis there is a patulous appearance and abundant stool with similar appearance to prior imaging. SKELETON: Signs of skeletal metastases. LEFT hemi sacrum (image 133/4) no discrete corresponding CT abnormality aside from minimal asymmetric sclerosis with a maximum SUV of 9.1 in an area measuring approximately 1.5 cm based on PET activity. LEFT posterior vertebral lesion approximately a cm at the L2 level with a maximum SUV of 4.5 and without discrete CT correlate aside from minimal sclerotic change in the area with asymmetry. Bilateral L4 lesions with similar FDG uptake and similar sized the L2 lesion, maximum SUV of 6.4 on the RIGHT (image 118/4) there is a focus of lytic change at this level not seen on the LEFT with there is also FDG uptake. Subtle area along the posterior LEFT third rib with minimal increased FDG uptake (image 43/4.) Mild increased FDG uptake along posterior elements, RIGHT T9 transverse process with a maximum SUV of 3.5. Mild uptake at the posterior aspect of the L4 vertebral body with subtle lucency in this area, not definitive. Mild increased metabolic activity at the origin of the LEFT pubic root adjacent to the LEFT hip replacement, acetabular component is of uncertain significance. Incidental CT findings: Spinal degenerative changes. Osteopenia. Post LEFT hip replacement. IMPRESSION: Diffuse hepatic metastatic disease with morphologic features that would favor breast cancer metastasis. 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 Correlation with recent biopsy results may be helpful. Signs of nodal and skeletal metastases. Cholelithiasis. Gallbladder is contracted on today's study with mild thickening and adjacent fluid, nonspecific in the setting of diffuse hepatic metastatic disease. Contracted appearance of the  gallbladder would argue against acute biliary process related to the gallbladder. Aortic Atherosclerosis (ICD10-I70.0). Electronically Signed   By: Zetta Bills M.D.   On: 08/14/2020 10:42   US BIOPSY (LIVER)  Result Date: 08/13/2020 INDICATION: 80 year old with remote history of breast cancer. Patient now has innumerable liver lesions and needs a tissue diagnosis. EXAM: ULTRASOUND-GUIDED LIVER LESION BIOPSY MEDICATIONS: Moderate sedation ANESTHESIA/SEDATION: Moderate (conscious) sedation was employed during this procedure. A total of Versed 2.0 mg and Fentanyl 100 mcg was administered intravenously. Moderate Sedation Time: 28 minutes. The patient's level of consciousness and vital signs were monitored continuously by radiology nursing throughout the procedure under my direct supervision. FLUOROSCOPY TIME:  Fluoroscopy Time: None COMPLICATIONS: None immediate. PROCEDURE: Informed written consent was obtained from the patient after a thorough discussion of the procedural risks, benefits and alternatives. All questions were addressed. Maximal Sterile Barrier Technique was utilized including caps, mask, sterile gowns, sterile gloves, sterile drape, hand hygiene and skin antiseptic. A timeout was performed prior to the initiation of the procedure. Patient was placed supine on the ultrasound table. Liver was identified with ultrasound. The right side of the abdomen was prepped with chlorhexidine and sterile field was created. Skin and soft tissues were anesthetized using 1% lidocaine. A small incision was made. Using ultrasound guidance, a 17 gauge coaxial needle was directed into the right inferior hepatic lobe where there are multiple adjacent lesions. Total of 3 core biopsies were obtained with an  18 gauge core device. Two adequate specimens were obtained. Specimens placed in formalin. Small amount of Gel-Foam slurry was injected through the 17 gauge needle as the needle was removed. Bandage placed over the  puncture site. FINDINGS: Liver is diffusely heterogeneous with innumerable lesions. Majority of the lesions are isoechoic. Some of the lesions have evidence of central necrosis. Lesion/lesions in the inferior right hepatic lobe was targeted because it was more conspicuous than other lesions. Needle position was confirmed within the lesion(s) and core samples were obtained. No immediate bleeding or hematoma formation. IMPRESSION: Ultrasound-guided core biopsy of right inferior hepatic lesion/lesions. Electronically Signed   By: Markus Daft M.D.   On: 08/13/2020 17:46   US Abdomen Limited RUQ (LIVER/GB)  Result Date: 08/09/2020 CLINICAL DATA:  Cholelithiasis Breast cancer EXAM: ULTRASOUND ABDOMEN LIMITED RIGHT UPPER QUADRANT COMPARISON:  CT chest, abdomen, and pelvis 08/07/2020 FINDINGS: Gallbladder: Sludge and stones noted within the gallbladder lumen. Mild thickening of the gallbladder wall. Sonographic Murphy sign negative per technologist. No pericholecystic fluid. Common bile duct: Diameter: 3 mm Liver: Nodular hepatic contours. Numerous hepatic masses again seen, most likely due to metastatic disease. Portal vein is patent on color Doppler imaging with normal direction of blood flow towards the liver. Other: None. IMPRESSION: 1. Cholelithiasis. Mild gallbladder wall thickening may be due to cholecystitis or reactive to liver disease. 2. Innumerable liver lesions consistent with metastatic disease. Electronically Signed   By: Miachel Roux M.D.   On: 08/09/2020 07:56    ASSESSMENT & PLAN:  80 y.o. female with  1.  Newly diagnosed metastatic breast cancer with liver mets, bone metastases and nodal metastases 2.  Failure to thrive with very poor p.o. intake 3.  Severe hypokalemia with muscle weakness. 4. acute kidney injury likely related to dehydration diuretics. 5. 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  6. 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 S/p Lumpectomy, Radiation, 4 cycles of Cytoxan and Taxotere. Completed maintenance Arimidex until Fall 2019 Continues with annual mammograms with OBGYN Dr. TimDonalynn FurlongPLAN: -Discussed pt labwork today, 08/16/2020; count stable,WBC elevated, potassium normal, kidney numbers improved, liver enzymes elevated more. -Discussed pt PET Skull Base to Thigh ([id number] 08/14/2020; extensive liver lesions that are lighting up, lymph nodes around the liver, several spots on the bone not shown on CT. -Discussed pt US [place]opsy Liver on 08/13/2020; confirmed to be metastatic disease of breast primary. -Discussed pt MR Brain ([id number] 08/11/2020; no concerns for spread of disease here. -Advised pt we are waiting on mor results from pathology for the hormone profile of the disease. This helps us Koreatermine and finalize the treatment.  -Ad[facility] pt that [facility] will allow us [place] see if their is anything local as well. -Advised pt the immediate concern is the extent of liver involvement. This is not reflecting in increased bilirubin or synthetic dysfunction. This is showing signs of damage through the elevated AST and ALT. -Advised pt we want to be careful with avoiding jumping, pushing, pulling, or heavy lifting. Will add high doses of bone stimulating shots to reduce risks of fractures and skeletal related events. Would do Zometa q4weeks if kidneys stable or Xgeva q4weeks if not. -Assuming pt has same profile as prior breast cancer, we would either : 1) define visceral crisis and performing some chemotherapy  prior to hormonal therapy or 2) going straight into checkpoint inibitor and hormonal therapies and avoiding cytotoxic chemotherapies.  -Due to fair amount of liver involvement, there is some discussion needed for direction of  treatment regarding how threatened her liver is.  -Advised pt that if it is recent since she has been off of the aromatase inhibitor (less than five years as the pt stated to her knowledge), we would most likely choose one with a different mechanism to avoid potential resistance to this. -Will go ahead and get Faslodex shot approval due to need for starting treatment sooner than later. -Will send referral to outpatient PT.  -Advised pt that no obvious lymph nodes nor any discrete masses noted on breast exam given.  -Recommended pt drink coconut water if desired.  -Continue to hold diuretics. Continue potassium. -Will set up education session for Faslodex.  -Will connect with nutritional therapist. -Unless hormone profile proves otherwise and discussion with colleagues is strongly for chemotherapy, we would want to use checkpoint inhibitor and hormone therapies first and holding chemotherapies. -Continue to avoid liver toxic medications. Avoid NSAIDs. -Will Rx Lorazepam for aid in sleeping instead of Valium. -Will see back next week   The total time spent in the appointment was 30 minutes and more than 50% was on counseling and direct patient cares.   All of the patient's questions were answered with apparent satisfaction. The patient knows to call the clinic with any problems, questions or concerns.   Sullivan Lone MD [place] AAHIVMS [facility] Hematology/Oncology Physician [facility]  (Office):       [phone] (Work cell):  [phone] (Fax):           [phone]  08/15/2020 7:14 PM  I, Reinaldo Raddle, am acting as scribe for Dr. Sullivan Lone, MD.    .I have reviewed the above documentation for accuracy and completeness, and I agree with the above. Brunetta Genera MD

## 2020-08-15 NOTE — Telephone Encounter (Signed)
Spouse called. Patient did not have mammogram today, it was rescheduled for Friday. Per Spouse, due to [facility] not receiving ppwk/records from [place]. Patient wants to know if she should still come to appt on Thursday with Dr. Irene Limbo. Per Dr. Irene Limbo, informed spouse/patient to come on Thursday to see him to review other test results. They verbalized understanding.

## 2020-08-16 ENCOUNTER — Other Ambulatory Visit: Payer: Self-pay

## 2020-08-16 ENCOUNTER — Inpatient Hospital Stay: Payer: Medicare Other

## 2020-08-16 ENCOUNTER — Inpatient Hospital Stay (HOSPITAL_BASED_OUTPATIENT_CLINIC_OR_DEPARTMENT_OTHER): Payer: Medicare Other | Admitting: Hematology

## 2020-08-16 DIAGNOSIS — C50919 Malignant neoplasm of unspecified site of unspecified female breast: Secondary | ICD-10-CM | POA: Insufficient documentation

## 2020-08-16 DIAGNOSIS — Z801 Family history of malignant neoplasm of trachea, bronchus and lung: Secondary | ICD-10-CM | POA: Diagnosis not present

## 2020-08-16 DIAGNOSIS — Z87891 Personal history of nicotine dependence: Secondary | ICD-10-CM | POA: Diagnosis not present

## 2020-08-16 DIAGNOSIS — Z803 Family history of malignant neoplasm of breast: Secondary | ICD-10-CM | POA: Diagnosis not present

## 2020-08-16 DIAGNOSIS — D473 Essential (hemorrhagic) thrombocythemia: Secondary | ICD-10-CM | POA: Insufficient documentation

## 2020-08-16 DIAGNOSIS — M6281 Muscle weakness (generalized): Secondary | ICD-10-CM | POA: Diagnosis not present

## 2020-08-16 DIAGNOSIS — C787 Secondary malignant neoplasm of liver and intrahepatic bile duct: Secondary | ICD-10-CM

## 2020-08-16 DIAGNOSIS — E876 Hypokalemia: Secondary | ICD-10-CM | POA: Insufficient documentation

## 2020-08-16 DIAGNOSIS — Z808 Family history of malignant neoplasm of other organs or systems: Secondary | ICD-10-CM | POA: Diagnosis not present

## 2020-08-16 DIAGNOSIS — Z170 Estrogen receptor positive status [ER+]: Secondary | ICD-10-CM | POA: Insufficient documentation

## 2020-08-16 DIAGNOSIS — R627 Adult failure to thrive: Secondary | ICD-10-CM | POA: Diagnosis not present

## 2020-08-16 DIAGNOSIS — Z8041 Family history of malignant neoplasm of ovary: Secondary | ICD-10-CM | POA: Diagnosis not present

## 2020-08-16 DIAGNOSIS — Z79899 Other long term (current) drug therapy: Secondary | ICD-10-CM | POA: Insufficient documentation

## 2020-08-16 DIAGNOSIS — C801 Malignant (primary) neoplasm, unspecified: Secondary | ICD-10-CM

## 2020-08-16 DIAGNOSIS — N179 Acute kidney failure, unspecified: Secondary | ICD-10-CM | POA: Diagnosis not present

## 2020-08-16 DIAGNOSIS — Z8049 Family history of malignant neoplasm of other genital organs: Secondary | ICD-10-CM | POA: Diagnosis not present

## 2020-08-16 DIAGNOSIS — Z5111 Encounter for antineoplastic chemotherapy: Secondary | ICD-10-CM | POA: Diagnosis not present

## 2020-08-16 DIAGNOSIS — I700 Atherosclerosis of aorta: Secondary | ICD-10-CM | POA: Diagnosis not present

## 2020-08-16 DIAGNOSIS — C7951 Secondary malignant neoplasm of bone: Secondary | ICD-10-CM | POA: Diagnosis not present

## 2020-08-16 DIAGNOSIS — Z8249 Family history of ischemic heart disease and other diseases of the circulatory system: Secondary | ICD-10-CM | POA: Diagnosis not present

## 2020-08-16 LAB — CMP (CANCER CENTER ONLY)
ALT: 97 U/L — ABNORMAL HIGH (ref 0–44)
AST: 135 U/L — ABNORMAL HIGH (ref 15–41)
Albumin: 3.2 g/dL — ABNORMAL LOW (ref 3.5–5.0)
Alkaline Phosphatase: 212 U/L — ABNORMAL HIGH (ref 38–126)
Anion gap: 12 (ref 5–15)
BUN: 28 mg/dL — ABNORMAL HIGH (ref 8–23)
CO2: 26 mmol/L (ref 22–32)
Calcium: 9.2 mg/dL (ref 8.9–10.3)
Chloride: 102 mmol/L (ref 98–111)
Creatinine: 1.26 mg/dL — ABNORMAL HIGH (ref 0.44–1.00)
GFR, Estimated: 44 mL/min — ABNORMAL LOW (ref >60)
Glucose, Bld: 110 mg/dL — ABNORMAL HIGH (ref 70–99)
Potassium: 4 mmol/L (ref 3.5–5.1)
Sodium: 140 mmol/L (ref 135–145)
Total Bilirubin: 0.6 mg/dL (ref 0.3–1.2)
Total Protein: 6.4 g/dL — ABNORMAL LOW (ref 6.5–8.1)

## 2020-08-16 LAB — CBC WITH DIFFERENTIAL (CANCER CENTER ONLY)
Abs Immature Granulocytes: 0.09 10*3/uL — ABNORMAL HIGH (ref 0.00–0.07)
Basophils Absolute: 0.1 10*3/uL (ref 0.0–0.1)
Basophils Relative: 0 %
Eosinophils Absolute: 0 10*3/uL (ref 0.0–0.5)
Eosinophils Relative: 0 %
HCT: 43.3 % (ref 36.0–46.0)
Hemoglobin: 13.4 g/dL (ref 12.0–15.0)
Immature Granulocytes: 1 %
Lymphocytes Relative: 4 %
Lymphs Abs: 0.6 10*3/uL — ABNORMAL LOW (ref 0.7–4.0)
MCH: 27.1 pg (ref 26.0–34.0)
MCHC: 30.9 g/dL (ref 30.0–36.0)
MCV: 87.5 fL (ref 80.0–100.0)
Monocytes Absolute: 0.6 10*3/uL (ref 0.1–1.0)
Monocytes Relative: 4 %
Neutro Abs: 13 10*3/uL — ABNORMAL HIGH (ref 1.7–7.7)
Neutrophils Relative %: 91 %
Platelet Count: 353 10*3/uL (ref 150–400)
RBC: 4.95 MIL/uL (ref 3.87–5.11)
RDW: 19.5 % — ABNORMAL HIGH (ref 11.5–15.5)
WBC Count: 14.3 10*3/uL — ABNORMAL HIGH (ref 4.0–10.5)
nRBC: 0.3 % — ABNORMAL HIGH (ref 0.0–0.2)

## 2020-08-16 NOTE — Progress Notes (Signed)
Thank you for choosing [facility] to provide your oncology and hematology care.  To afford each patient quality time with our providers, please arrive 30 minutes before your scheduled appointment time.  If you arrive late for your appointment, you may be asked to reschedule.  We strive to give you quality time with our providers, and arriving late affects you and other patients whose appointments are after yours.   If you are a no show for multiple scheduled visits, you may be dismissed from the clinic at the providers discretion.    Again, thank you for choosing [facility], our hope is that these requests will decrease the amount of time that you wait before being seen by our physicians.  ______________________________________________________________________  Should you have questions after your visit to the [facility], please contact our office at [phone] between the hours of 8:30 and 4:30 p.m.    Voicemails left after 4:30p.m will not be returned until the following business day.    For prescription refill requests, please have your pharmacy contact us directly.  Please also try to allow 48 hours for prescription requests.    Please contact the scheduling department for questions regarding scheduling.  For scheduling of procedures such as PET scans, CT scans, MRI, Ultrasound, etc please contact central scheduling at [phone].    Resources For Cancer Patients and Caregivers:   Oncolink.org:  A wonderful resource for patients and healthcare providers for information regarding your disease, ways to tract your treatment, what to expect, etc.     American Cancer Society:  800-227-2345  Can help patients locate various types of support and financial assistance  Cancer Care: 1-800-813-HOPE (4673) Provides financial assistance, online support groups, medication/co-pay assistance.    [place] DSS:  336-641-3447 Where to apply for food  stamps, Medicaid, and utility assistance  Medicare Rights Center: 800-333-4114 Helps people with Medicare understand their rights and benefits, navigate the Medicare system, and secure the quality healthcare they deserve  SCAT: 336-333-6589 [place] [employer]'s shared-ride transportation service for eligible riders who have a disability that prevents them from riding the fixed route bus.    For additional information on assistance programs please contact our social worker:   Grier Hock/Abigail Elmore:  [phone]            

## 2020-08-17 ENCOUNTER — Ambulatory Visit
Admission: RE | Admit: 2020-08-17 | Discharge: 2020-08-17 | Disposition: A | Discharge status: Home / Self Care | Payer: Medicare Other | Source: Ambulatory Visit | Attending: Hematology | Admitting: Hematology

## 2020-08-17 ENCOUNTER — Other Ambulatory Visit: Payer: Self-pay | Admitting: Oncology

## 2020-08-17 ENCOUNTER — Other Ambulatory Visit: Payer: Self-pay

## 2020-08-17 ENCOUNTER — Other Ambulatory Visit: Payer: Self-pay | Admitting: Hematology

## 2020-08-17 DIAGNOSIS — D471 Chronic myeloproliferative disease: Secondary | ICD-10-CM

## 2020-08-17 DIAGNOSIS — Z853 Personal history of malignant neoplasm of breast: Secondary | ICD-10-CM

## 2020-08-17 DIAGNOSIS — N6320 Unspecified lump in the left breast, unspecified quadrant: Secondary | ICD-10-CM

## 2020-08-17 DIAGNOSIS — N6489 Other specified disorders of breast: Secondary | ICD-10-CM | POA: Diagnosis not present

## 2020-08-17 DIAGNOSIS — R922 Inconclusive mammogram: Secondary | ICD-10-CM | POA: Diagnosis not present

## 2020-08-17 DIAGNOSIS — M818 Other osteoporosis without current pathological fracture: Secondary | ICD-10-CM

## 2020-08-17 DIAGNOSIS — K55069 Acute infarction of intestine, part and extent unspecified: Secondary | ICD-10-CM

## 2020-08-17 DIAGNOSIS — T386X5A Adverse effect of antigonadotrophins, antiestrogens, antiandrogens, not elsewhere classified, initial encounter: Secondary | ICD-10-CM

## 2020-08-17 LAB — SURGICAL PATHOLOGY

## 2020-08-20 ENCOUNTER — Encounter: Payer: Self-pay | Admitting: Hematology

## 2020-08-20 ENCOUNTER — Other Ambulatory Visit (HOSPITAL_COMMUNITY): Payer: Self-pay

## 2020-08-20 DIAGNOSIS — C50919 Malignant neoplasm of unspecified site of unspecified female breast: Secondary | ICD-10-CM | POA: Insufficient documentation

## 2020-08-20 DIAGNOSIS — C7951 Secondary malignant neoplasm of bone: Secondary | ICD-10-CM | POA: Insufficient documentation

## 2020-08-20 MED ORDER — ABEMACICLIB 100 MG PO TABS
100.0000 mg | ORAL_TABLET | Freq: Two times a day (BID) | ORAL | 1 refills | Status: DC
  Filled 2020-08-20: qty 60, 30d supply, fill #0

## 2020-08-21 ENCOUNTER — Telehealth: Payer: Self-pay | Admitting: Hematology

## 2020-08-21 ENCOUNTER — Other Ambulatory Visit (HOSPITAL_COMMUNITY): Payer: Self-pay

## 2020-08-21 ENCOUNTER — Telehealth: Payer: Self-pay

## 2020-08-21 ENCOUNTER — Telehealth: Payer: Self-pay | Admitting: Pharmacist

## 2020-08-21 NOTE — Telephone Encounter (Signed)
Oral Oncology Pharmacist Encounter  Received new prescription for Verzenio (abemaciclib) for the treatment of metastatic HR positive, HER2 negative breast cancer in conjunction with fulvestrant, planned duration until disease progression or unacceptable drug toxicity.  Prescription dose and frequency assessed for appropriateness. Appropriate for therapy initiation. Per Dr. Irene Limbo patient is starting on dose reduction.   CBC w/ Diff and CMP from 08/16/20 assessed, patient with baseline elevated LFTs (AST 135, ALT 97 U/L, Alk phos 212 U/L). Recommend monitoring LFTs closely while on therapy for further dose adjustment needs. Scr 1.26 mg/dL - no renal dose adjustments required.  Current medication list in Epic reviewed, no relevant/significant DDIs with Verzenio identified.  Evaluated chart and no patient barriers to medication adherence noted.   Prescription has been e-scribed to the [facility] for benefits analysis and approval.  Oral Oncology Clinic will continue to follow for insurance authorization, copayment issues, initial counseling and start date.  Leron Croak, PharmD, BCPS Hematology/Oncology Clinical Pharmacist [facility] [phone] 08/21/2020 11:45 AM

## 2020-08-21 NOTE — Telephone Encounter (Signed)
Scheduled appts per 5/23 sch msg. Pt is aware.

## 2020-08-21 NOTE — Telephone Encounter (Signed)
Oral Oncology Patient Advocate Encounter  After completing a benefits investigation, prior authorization for Verzenio is not required at this time through [employer].  Patient's copay is [phone].    [place] Patient [place] Phone [phone] Fax [phone] 08/21/2020 11:27 AM

## 2020-08-22 ENCOUNTER — Encounter: Payer: Self-pay | Admitting: Hematology

## 2020-08-22 ENCOUNTER — Inpatient Hospital Stay: Payer: Medicare Other

## 2020-08-22 ENCOUNTER — Other Ambulatory Visit (HOSPITAL_COMMUNITY): Payer: Self-pay

## 2020-08-22 ENCOUNTER — Other Ambulatory Visit: Payer: Self-pay

## 2020-08-23 ENCOUNTER — Encounter: Payer: Self-pay | Admitting: Hematology

## 2020-08-23 ENCOUNTER — Telehealth: Payer: Self-pay

## 2020-08-23 ENCOUNTER — Other Ambulatory Visit: Payer: Self-pay

## 2020-08-23 DIAGNOSIS — C50919 Malignant neoplasm of unspecified site of unspecified female breast: Secondary | ICD-10-CM

## 2020-08-23 NOTE — Telephone Encounter (Signed)
Patient is approved for Verzenio at no cost from [employer] 08/22/20-03/30/21  Lilly uses [place] Patient [place] Phone [phone] Fax [phone] 08/23/2020 9:05 AM

## 2020-08-23 NOTE — Progress Notes (Signed)
Called pt to introduce myself as her Arboriculturist.  Pt has 2 insurances so copay assistance shouldn't be needed.  Pt wanted me to speak to her daughter so I informed her of the [employer], went over what it covers and gave her the income requirement.  She thinks they are overqualified but she will check with her father to make sure.  I will plan to meet them on 08/24/20 to see if she would like to apply for the grant.  I will also give her my card for any questions or concerns she may have in the future.

## 2020-08-23 NOTE — Progress Notes (Signed)
Pt's husband called back stating they exceed the income requirement for the [employer] grant so she doesn't qualify at this time.

## 2020-08-23 NOTE — Telephone Encounter (Signed)
Oral Oncology Patient Advocate Encounter  Met patient in [place] to complete an application for [employer] Patient Assistance Program in an effort to reduce the patient's out of pocket expense for Verzenio to $0.    Application completed and faxed to [phone].   [employer] patient assistance phone number for follow up is [phone].   This encounter will be updated until final determination.      [place] Patient [place] Phone [phone] Fax [phone] 08/23/2020 8:49 AM

## 2020-08-23 NOTE — Progress Notes (Signed)
HEMATOLOGY/ONCOLOGY CLINIC NOTE  Date of Service: 08/24/2020  Patient Care Team: Gaynelle Arabian, MD as PCP - General (Family Medicine) Skeet Latch, MD as PCP - Cardiology (Cardiology) Annia Belt, MD as Consulting Physician (Oncology) Rolm Bookbinder, MD as Consulting Physician (General Surgery)  CHIEF COMPLAINTS/PURPOSE OF CONSULTATION:  Essential Thrombocytosis Newly metastatic breast cancer  HISTORY OF PRESENTING ILLNESS:   Gwendolyn Williams is a wonderful 80 y.o. female who has been referred to [place] by Dr. Gaynelle Arabian, and was previously under the care of Dr. 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 The pt reports that she is doing well overall.   The pt reports that she has seen Dr. Michail Sermon in GI and is concerned for esophageal spasm but does not wish to proceed with the diagnostic work up with esophageal manometry. She notes that muscle relaxants help with this occasional pain. She also follows up with Dr. Donne Hazel in surgery to determine if this related to her previous abdominal surgeries. 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  The pt notes that she does not have any other present concerns. She notes that she had SOB at the end of last year, as part of bronchiectasis, and takes Memory Dance once a day, denies needing her rescue inhaler. She denies positional elements to her SOB.  The pt notes that she was off Hyxdroxyurea during the Fall of 2019 during this work up. She then returned to $RemoveBef'500mg'vcvFDrhzUx$  Hydoxyurea once a day and her PLT have normalized. She takes this in the morning with her food and denies any problems tolerating this medication.   The pt denies any concerns for bleeding at this time.  The pt continues with annual mammograms with her OBGYN Dr. Donalynn Furlong. The pt pursued maintenance Arimidex for 8.5 years,  and stopped during the evaluation of her respiratory symptoms in Fall 2019. The pt completes a bone density study every year, and notes she has stable osteopenia. She takes Vitamin D replacement and notes her levels are good.  She continues on $RemoveBefo'20mg'UdEkHmPGDea$  Xarelto. She continues on $RemoveBefo'81mg'ylQQbiruQrQ$  aspirin as well.  The pt notes that she has had chronic mouth ulcers for "decades." She has tried Vitamin B complex and mouthwashes. She notes that she routinely gets mouth ulcers after her dental cleanings as well.  Most recent lab results (06/22/18) of CBC w/diff and CMP is as follows: all values are WNL except for RDW at 25.2, Potassium at 3.3, BUN at 30, Creatinine at 1.23, GFR at 43.  On review of systems, pt reports intermittent esophageal discomfort, stable breathing, good energy levels, some abdominal tenderness, chronic mouth ulcers, and denies concerns for bleeding, and any other symptoms.  Interval History:   Gwendolyn Williams returns today for management and evaluation of newly diagnosed ER+ PR- Her2- metastatic breast carcinoma. The patient's last visit with [place] was on 08/16/2020. The pt reports that she is doing well overall. We are joined today by her husband.  The pt reports that she has been trying to eat more and more frequently, but has still been losing weight. She has lost three pounds since the last visit.  Lab results today 08/24/2020 of CBC w/diff and CMP is as follows: all values are WNL except for WBC of 12.6K, RDW of 19.6, nRBC of 0.3, Neutro Abs of 11.9K, Lymphs Abs of 0.3K, Chloride of 132, BUN of  41, Creatinine of 1.46, Total protein of 6.3, Albumin of 3.1, AST of 220, ALT of 219, Alkaline Phosphatase of 323, GFR est of 37.  On review of systems, pt reports weight loss, weakness, tremors, difficulty sleeping, decreased appetite, continued abdominal distention, intermittent acid reflux and denies changes in bowel habits, acute back pains, leg swelling, and any other symptoms.  MEDICAL HISTORY:   Past Medical History:  Diagnosis Date  . Arthritis   . Asthma    controlled with singulair  . Breast cancer ([place])    stage I left breast  . Current use of long term anticoagulation 04/21/2017  . Dyspnea, unspecified 02/24/2017   Started coincident with [place], exertional and orthostatic, normal exam. O2 sat 100%.  . Family history of breast cancer   . GERD (gastroesophageal reflux disease)   . H/O blood clots    clotting disorder since 2010  . Hematoma of arm 08/09/2012  . 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 Hyperlipidemia   . Hypertension   . 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 Pneumonia    viral pneumonia as a kid  . PONV (postoperative nausea and vomiting)     SURGICAL HISTORY: Past Surgical History:  Procedure Laterality Date  . BREAST SURGERY     Left breast lumpectomy sentinel node biopsy  . CERVICAL SPINE SURGERY  05/23/2008  . COLECTOMY  04/11/2008   left colectomy with end colostomy due to clot  . COLOSTOMY TAKEDOWN  08/14/2008  . EYE SURGERY     bil cataracts  . HERNIA REPAIR  2011   [facility]  . INTRAVASCULAR PRESSURE WIRE/FFR STUDY N/A 01/25/2018   Procedure: INTRAVASCULAR PRESSURE WIRE/FFR STUDY;  Surgeon: Nelva Bush, MD;  Location: [facility];  Service: Cardiovascular;  Laterality: N/A;  . LEFT HEART CATH AND CORONARY ANGIOGRAPHY N/A 01/25/2018   Procedure: LEFT HEART CATH AND CORONARY ANGIOGRAPHY;  Surgeon: Nelva Bush, MD;  Location: [facility];  Service: Cardiovascular;  Laterality: N/A;  . LUMBAR [place] SURGERY  2006  . TOTAL HIP ARTHROPLASTY Left 10/07/2016   Procedure: LEFT TOTAL HIP ARTHROPLASTY ANTERIOR APPROACH;  Surgeon: Paralee Cancel, MD;  Location: [facility];  Service: Orthopedics;  Laterality: Left;  70 mins    SOCIAL HISTORY: Social History   Socioeconomic History  . Marital status: Married    Spouse name: Not on file  . Number of  children: Not on file  . Years of education: Not on file  . Highest education level: Not on file  Occupational History  . Not on file  Tobacco Use  . Smoking status: Former Smoker    Packs/day: 1.00    Years: 5.00    Pack years: 5.00    Start date: 64    Quit date: 1964    Years since quitting: 58.4  . Smokeless tobacco: Never Used  Vaping Use  . Vaping Use: Never used  Substance and Sexual Activity  . Alcohol use: Yes    Alcohol/week: 0.0 standard drinks    Comment: Rare  . Drug use: No  . Sexual activity: Not Currently    Partners: Male    Comment: 1st intercourse 80 yo-Fewer than 5 partners  Other Topics Concern  . Not on file  Social History Narrative  . Not on file   Social Determinants of Health   Financial Resource Strain: Not on file  Food Insecurity: Not on file  Transportation Needs: Not on file  Physical Activity: Not on file  Stress: Not on file  Social Connections: Not on file  Intimate Partner Violence: Not on file    FAMILY HISTORY: Family History  Problem Relation Age of Onset  . Breast cancer Mother 39  . Lung cancer Mother   . Heart disease Father   . Basal cell carcinoma Father        x2  . CAD Father   . Ovarian cysts Sister   . Dementia Sister   . Breast cancer Maternal Aunt 61  . Breast cancer Paternal Aunt 48  . Ovarian cysts Maternal Grandmother        possibly had ovarian cancer as well  . Breast cancer Maternal Aunt 34  . Cancer Other        either pancreatic or colon  . Other Daughter 71       breast lumpectomy- complex sclerosing lesion  . Polycystic ovary syndrome Daughter   . Bladder Cancer Cousin     ALLERGIES:  is allergic to lipitor [atorvastatin calcium], penicillins, sulfa antibiotics, and tamiflu [oseltamivir phosphate].  MEDICATIONS:  Current Outpatient Medications  Medication Sig Dispense Refill  . abemaciclib (VERZENIO) 100 MG tablet Take 1 tablet (100 mg total) by mouth 2 (two) times daily. Swallow tablets  whole. Do not chew, crush, or split tablets before swallowing. 60 tablet 1  . acetaminophen (TYLENOL) 500 MG tablet Take 500 mg by mouth every 6 (six) hours as needed for moderate pain.    Marland Kitchen albuterol (VENTOLIN HFA) 108 (90 Base) MCG/ACT inhaler Inhale 2 puffs into the lungs every 6 (six) hours as needed for wheezing or shortness of breath. 18 g 3  . amLODipine (NORVASC) 2.5 MG tablet Take 1 tablet (2.5 mg total) by mouth daily. TO BE TAKEN WITH THE 5 MG TABLET TO EQUAL 7.5 MG DAILY 90 tablet 3  . amLODipine (NORVASC) 2.5 MG tablet Take 2.5 mg by mouth daily.    . Biotin 10 MG TABS Take 1 tablet by mouth daily.    Marland Kitchen buPROPion (WELLBUTRIN XL) 150 MG 24 hr tablet Take 1 tablet by mouth every morning.    . Cholecalciferol (VITAMIN D) 2000 units CAPS Take 4,000 Units by mouth daily.     . Coenzyme Q10 (CO Q-10) 100 MG CAPS Take 1 capsule by mouth daily.    Marland Kitchen dexamethasone (DECADRON) 4 MG tablet Take 1 tablet (4 mg total) by mouth every 12 (twelve) hours. 60 tablet 3  . diazepam (VALIUM) 5 MG tablet Take 5 mg by mouth every 6 (six) hours as needed for anxiety.    . docusate sodium (COLACE) 100 MG capsule Take 1 capsule (100 mg total) by mouth 2 (two) times daily. (Patient taking differently: Take 100 mg by mouth daily.) 10 capsule 0  . esomeprazole (NEXIUM) 40 MG capsule Take 40 mg by mouth daily at 6 (six) AM.    . fluticasone furoate-vilanterol (BREO ELLIPTA) 200-25 MCG/INH AEPB USE 1 INHALATION DAILY 818 each 3  . folic acid (FOLVITE) 1 MG tablet Take 1 mg by mouth daily.    . hydroxyurea (HYDREA) 500 MG capsule TAKE ONE CAPSULE BY MOUTH DAILY WITH FOOD TO MINIMIZE GI SIDE EFFECTS 90 capsule 0  . hyoscyamine (LEVSIN SL) 0.125 MG SL tablet Take 1 to 3 tablets three times daily as needed (Patient taking differently: Take 0.125 mg by mouth every 6 (six) hours as needed for cramping.) 90 tablet 11  . ondansetron (ZOFRAN) 4 MG  tablet Take 1 tablet (4 mg total) by mouth every 6 (six) hours as needed for  nausea. 30 tablet 3  . polyethylene glycol (MIRALAX / GLYCOLAX) packet Take 17 g by mouth 2 (two) times daily. (Patient taking differently: Take 17 g by mouth daily.) 14 each 0  . potassium chloride (KLOR-CON) 8 MEQ tablet Take 1 tablet (8 mEq total) by mouth daily. 30 tablet 3  . rivaroxaban (XARELTO) 10 MG TABS tablet Take 1 tablet (10 mg total) by mouth daily. 30 tablet 3  . rosuvastatin (CRESTOR) 40 MG tablet Take 1 tablet (40 mg total) by mouth daily. 90 tablet 3  . Spacer/Aero-Holding Chambers DEVI 1 Device by Does not apply route as directed. 1 each 0   No current facility-administered medications for this visit.    REVIEW OF SYSTEMS:   10 Point review of Systems was done is negative except as noted above.  PHYSICAL EXAMINATION: ECOG FS:2 - Symptomatic, <50% confined to bed  Vitals:   08/24/20 1032  BP: 118/81  Pulse: 87  Resp: 18  Temp: (!) 97.4 F (36.3 C)  SpO2: 97%   Wt Readings from Last 3 Encounters:  08/24/20 90 lb 6.4 oz (41 kg)  08/16/20 93 lb 6.4 oz (42.4 kg)  08/13/20 92 lb (41.7 kg)   Body mass index is 18.26 kg/m.    Exam was given in a chair.  GENERAL:alert, in no acute distress and comfortable SKIN: no acute rashes, no significant lesions EYES: conjunctiva are pink and non-injected, sclera anicteric OROPHARYNX: MMM, no exudates, no oropharyngeal erythema or ulceration NECK: supple, no JVD LYMPH:  no palpable lymphadenopathy in the cervical, axillary or inguinal regions LUNGS: clear to auscultation b/l with normal respiratory effort HEART: regular rate & rhythm ABDOMEN:  normoactive bowel sounds , non tender, not distended. Extremity: no pedal edema PSYCH: alert & oriented x 3 with fluent speech NEURO: no focal motor/sensory deficits  LABORATORY DATA:  I have reviewed the data as listed  . CBC Latest Ref Rng & Units 08/24/2020 08/16/2020 08/13/2020  WBC 4.0 - 10.5 K/uL 12.6(H) 14.3(H) 11.3(H)  Hemoglobin 12.0 - 15.0 g/dL 13.4 13.4 13.1   Hematocrit 36.0 - 46.0 % 43.9 43.3 42.4  Platelets 150 - 400 K/uL 178 353 266    . CMP Latest Ref Rng & Units 08/24/2020 08/16/2020 08/10/2020  Glucose 70 - 99 mg/dL 132(H) 110(H) 172(H)  BUN 8 - 23 mg/dL 41(H) 28(H) 24(H)  Creatinine 0.44 - 1.00 mg/dL 1.46(H) 1.26(H) 1.46(H)  Sodium 135 - 145 mmol/L 138 140 139  Potassium 3.5 - 5.1 mmol/L 3.5 4.0 4.0  Chloride 98 - 111 mmol/L 101 102 105  CO2 22 - 32 mmol/L $RemoveB'22 26 27  'uPoJlmRP$ Calcium 8.9 - 10.3 mg/dL 9.1 9.2 9.0  Total Protein 6.5 - 8.1 g/dL 6.3(L) 6.4(L) -  Total Bilirubin 0.3 - 1.2 mg/dL 0.8 0.6 -  Alkaline Phos 38 - 126 U/L 323(H) 212(H) -  AST 15 - 41 U/L 220(HH) 135(H) -  ALT 0 - 44 U/L 219(H) 97(H) -   06/07/08 JAK2:    RADIOGRAPHIC STUDIES: I have personally reviewed the radiological images as listed and agreed with the findings in the report. MR Brain W Wo Contrast  Result Date: 08/11/2020 CLINICAL DATA:  Staging cancer of unknown primary. EXAM: MRI HEAD WITHOUT AND WITH CONTRAST TECHNIQUE: Multiplanar, multiecho pulse sequences of the brain and surrounding structures were obtained without and with intravenous contrast. CONTRAST:  63mL GADAVIST GADOBUTROL 1 MMOL/ML IV SOLN COMPARISON:  Brain MRI 06/22/2013 FINDINGS: Brain: No enhancement or swelling to suggest metastatic disease. No infarct, hemorrhage, hydrocephalus, or collection. Age normal brain volume and white matter appearance. Vascular: Normal flow voids. Skull and upper cervical spine: Multilevel cervical spine degeneration. Retro dental ligamentous thickening without cervicomedullary compression. Sinuses/Orbits: Bilateral cataract resection. No pathologic finding. IMPRESSION: No evidence of metastatic disease. Electronically Signed   By: Monte Fantasia M.D.   On: 08/11/2020 08:24   CT CHEST ABDOMEN PELVIS W CONTRAST  Result Date: 08/07/2020 CLINICAL DATA:  3 pound weight loss over the past few weeks. Abdominal distention and nausea. RIGHT LOWER QUADRANT swelling. History of  LEFT breast cancer with lumpectomy. History of colon resection. History of smoking. EXAM: CT CHEST, ABDOMEN, AND PELVIS WITH CONTRAST TECHNIQUE: Multidetector CT imaging of the chest, abdomen and pelvis was performed following the standard protocol during bolus administration of intravenous contrast. CONTRAST:  59mL ISOVUE-300 IOPAMIDOL (ISOVUE-300) INJECTION 61% COMPARISON:  02/12/2018 and 11/20/2016 FINDINGS: CT CHEST FINDINGS Cardiovascular: Heart size is normal. There is atherosclerotic calcification of the arteries. There is atherosclerotic calcification of the thoracic aorta not associated with aneurysm. No pericardial effusion. Pulmonary arteries are normally opacified account for the contrast bolus timing. Mediastinum/Nodes: Esophagus is normal. No mediastinal, hilar, or axillary adenopathy. A 5 millimeter nodule is identified within the LEFT lobe of the thyroid gland. Otherwise the thyroid is normal in appearance. Lungs/Pleura: There are emphysematous changes throughout the lungs. No pulmonary nodules, consolidations, or pleural effusions. There is minimal RIGHT apical scarring. Musculoskeletal: Remote LOWER cervical spine fusion. Minimal degenerative changes in the midthoracic spine. No acute fracture or subluxation. No suspicious lytic or blastic lesions. CT ABDOMEN PELVIS FINDINGS Hepatobiliary: Liver size is UPPER limits normal. The liver contains innumerable rim enhancing lesions throughout all lobes. Confluent low-attenuation is identified within the anterior aspect of the RIGHT hepatic lobe. Small amount of perihepatic fluid is present. The largest, confluent lesion measures approximately 6.5 x 4.5 centimeters. Other more discrete rounded lesions measure from 6 millimeters to 3.0 centimeters. There is a small amount of pericholecystic fluid. Calcified gallstone measures at least 1.6 centimeters. There is enhancement of the mucosa of the gallbladder. Consider further evaluation with gallbladder  ultrasound. Pancreas: Unremarkable. No pancreatic ductal dilatation or surrounding inflammatory changes. Spleen: Normal in size without focal abnormality. Adrenals/Urinary Tract: Adrenal glands are normal in appearance. A 1 centimeter simple cyst is identified in UPPER pole of the LEFT kidney. There is symmetric bilateral renal enhancement and early excretion of contrast. The ureters are unremarkable. Distal aspects of the ureters are partially obscured by a over lying artifact. The urinary bladder is otherwise normal in appearance. Stomach/Bowel: The stomach and small bowel loops are normal in appearance. Small bowel loops are normal in appearance. The appendix is not well seen. Central pelvic small bowel loops are partially obscured by metal artifact in the LEFT hip. Colo-colic anastomosis is identified in the LEFT mid abdomen and is distended with a significant amount of stool. No obstructing mass identified. Vascular/Lymphatic: There is atherosclerotic calcification of the abdominal aorta. Portacaval lymph node is 1.1 centimeters. Unknown along the dorsum of the pancreatic neck is 1.1 centimeters. Although involved by atherosclerosis, there is vascular opacification of the celiac axis, superior mesenteric artery, and inferior mesenteric artery. Normal appearance of the portal venous system and inferior vena cava. Reproductive: The uterus is present.  No adnexal mass. Other: No free pelvic fluid. Anterior abdominal wall is unremarkable. Musculoskeletal: Degenerative changes are seen in the lumbar spine. No lytic or blastic  lesions are noted. Previous LEFT hip arthroplasty. IMPRESSION: 1. Innumerable liver lesions consistent with diffuse metastatic disease. Confluent lesion in the LEFT hepatic lobe is 6.5 centimeters. 2. Cholelithiasis and enhancement of the gallbladder mucosa along with a small amount of pericholecystic fluid raise the question of gallbladder disease. Consider further evaluation gallbladder  ultrasound. 3. Previous colon resection with follow-up pelvic anastomosis in the LEFT mid abdomen. 4. Significant stool burden. 5. Minimally enlarged lymph nodes in the periportal region. 6.  Aortic atherosclerosis.  (ICD10-I70.0) 7. No evidence for osseous metastatic disease. 8. Aortic atherosclerosis.  (ICD10-I70.0) 9.  Emphysema (ICD10-J43.9). These results will be called to the ordering clinician or representative by the Radiologist Assistant, and communication documented in the PACS or [employer]. Electronically Signed   By: Nolon Nations M.D.   On: 08/07/2020 13:20   NM PET Image Initial (PI) Skull Base To Thigh  Result Date: 08/14/2020 CLINICAL DATA:  Initial treatment strategy for cancer of unknown primary. EXAM: NUCLEAR MEDICINE PET SKULL BASE TO THIGH TECHNIQUE: 5.06 mCi F-18 FDG was injected intravenously. Full-ring PET imaging was performed from the skull base to thigh after the radiotracer. CT data was obtained and used for attenuation correction and anatomic localization. Fasting blood glucose: 108 mg/dl COMPARISON:  Chest abdomen pelvis CT from Aug 07, 2020. FINDINGS: Mediastinal blood pool activity: SUV max 1.74 Liver activity: SUV max NA NECK: LEFT level IV a lymph node at the thoracic inlet (image 41/4) 7 mm short axis with a maximum SUV of 4.7 no additional signs of adenopathy in the neck. Incidental CT findings: none CHEST: No hypermetabolic mediastinal or hilar nodes. No suspicious pulmonary nodules on the CT scan. Incidental CT findings: Aortic atherosclerosis. Normal heart size. Three-vessel coronary artery disease. No substantial pericardial fluid. Normal caliber central pulmonary vessels. Limited assessment of cardiovascular structures given lack of intravenous contrast. Mild biapical pleural and parenchymal scarring. Mild basilar atelectasis. No suspicious nodule, consolidation or substantial pleural effusion. Airways are patent. ABDOMEN/PELVIS: Diffuse hepatic metastatic  disease, largest area in the dome of the RIGHT hemi liver measuring in total near 10 x 6 cm on image 90 of series 4 and as much is 11 x 6 cm on image 94 of series 4. All areas, subsegments of the liver are involved. Maximum SUV obtained in the liver in the dominant lesion is 8.7 Celiac adenopathy better seen on the previous CT (image 100/4) short axis measurement 11 mm with a maximum SUV of 6.2 Mild increased FDG uptake in the intra-aortocaval groove and along the LEFT para aortic chain (image 105/4) 9 mm lymph node with a maximum SUV of 6.6. No additional solid organ or nodal uptake in the abdomen. RIGHT groin lymph node with a maximum SUV of 5.4 on image 151 of series 4 measuring approximately 6 mm short axis Incidental CT findings: Cholelithiasis. Trace ascites. Morphologic features of capsular retraction with respect to the dominant liver met. No acute findings relative to pancreas, spleen, adrenal glands and kidneys. Trace ascites in the pelvis. No acute gastrointestinal process. Signs of partial colonic resection in the LEFT hemiabdomen with anastomosis, at the level of the anastomosis there is a patulous appearance and abundant stool with similar appearance to prior imaging. SKELETON: Signs of skeletal metastases. LEFT hemi sacrum (image 133/4) no discrete corresponding CT abnormality aside from minimal asymmetric sclerosis with a maximum SUV of 9.1 in an area measuring approximately 1.5 cm based on PET activity. LEFT posterior vertebral lesion approximately a cm at the L2 level with  a maximum SUV of 4.5 and without discrete CT correlate aside from minimal sclerotic change in the area with asymmetry. Bilateral L4 lesions with similar FDG uptake and similar sized the L2 lesion, maximum SUV of 6.4 on the RIGHT (image 118/4) there is a focus of lytic change at this level not seen on the LEFT with there is also FDG uptake. Subtle area along the posterior LEFT third rib with minimal increased FDG uptake (image  43/4.) Mild increased FDG uptake along posterior elements, RIGHT T9 transverse process with a maximum SUV of 3.5. Mild uptake at the posterior aspect of the L4 vertebral body with subtle lucency in this area, not definitive. Mild increased metabolic activity at the origin of the LEFT pubic root adjacent to the LEFT hip replacement, acetabular component is of uncertain significance. Incidental CT findings: Spinal degenerative changes. Osteopenia. Post LEFT hip replacement. IMPRESSION: Diffuse hepatic metastatic disease with morphologic features that would favor breast cancer metastasis. 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 Correlation with recent biopsy results may be helpful. Signs of nodal and skeletal metastases. Cholelithiasis. Gallbladder is contracted on today's study with mild thickening and adjacent fluid, nonspecific in the setting of diffuse hepatic metastatic disease. Contracted appearance of the gallbladder would argue against acute biliary process related to the gallbladder. Aortic Atherosclerosis (ICD10-I70.0). Electronically Signed   By: Zetta Bills M.D.   On: 08/14/2020 10:42   US BIOPSY (LIVER)  Result Date: 08/13/2020 INDICATION: 80 year old with remote history of breast cancer. Patient now has innumerable liver lesions and needs a tissue diagnosis. EXAM: ULTRASOUND-GUIDED LIVER LESION BIOPSY MEDICATIONS: Moderate sedation ANESTHESIA/SEDATION: Moderate (conscious) sedation was employed during this procedure. A total of Versed 2.0 mg and Fentanyl 100 mcg was administered intravenously. Moderate Sedation Time: 28 minutes. The patient's level of consciousness and vital signs were monitored continuously by radiology nursing throughout the procedure under my direct supervision. FLUOROSCOPY TIME:  Fluoroscopy Time: None COMPLICATIONS: None immediate. PROCEDURE: Informed written consent was obtained from the patient after a thorough discussion of the  procedural risks, benefits and alternatives. All questions were addressed. Maximal Sterile Barrier Technique was utilized including caps, mask, sterile gowns, sterile gloves, sterile drape, hand hygiene and skin antiseptic. A timeout was performed prior to the initiation of the procedure. Patient was placed supine on the ultrasound table. Liver was identified with ultrasound. The right side of the abdomen was prepped with chlorhexidine and sterile field was created. Skin and soft tissues were anesthetized using 1% lidocaine. A small incision was made. Using ultrasound guidance, a 17 gauge coaxial needle was directed into the right inferior hepatic lobe where there are multiple adjacent lesions. Total of 3 core biopsies were obtained with an 18 gauge core device. Two adequate specimens were obtained. Specimens placed in formalin. Small amount of Gel-Foam slurry was injected through the 17 gauge needle as the needle was removed. Bandage placed over the puncture site. FINDINGS: Liver is diffusely heterogeneous with innumerable lesions. Majority of the lesions are isoechoic. Some of the lesions have evidence of central necrosis. Lesion/lesions in the inferior right hepatic lobe was targeted because it was more conspicuous than other lesions. Needle position was confirmed within the lesion(s) and core samples were obtained. No immediate bleeding or hematoma formation. IMPRESSION: Ultrasound-guided core biopsy of right inferior hepatic lesion/lesions. Electronically Signed   By: Markus Daft M.D.   On: 08/13/2020 17:46   US BREAST LTD UNI LEFT INC AXILLA  Result Date: 08/17/2020 CLINICAL DATA:  Patient with  cancer of unknown primary involving the liver. History of left breast cancer. EXAM: DIGITAL DIAGNOSTIC BILATERAL MAMMOGRAM WITH TOMOSYNTHESIS AND CAD; ULTRASOUND LEFT BREAST LIMITED TECHNIQUE: Bilateral digital diagnostic mammography and breast tomosynthesis was performed. The images were evaluated with  computer-aided detection.; Targeted ultrasound examination of the left breast was performed COMPARISON:  Previous exam(s). ACR Breast Density Category c: The breast tissue is heterogeneously dense, which may obscure small masses. FINDINGS: Stable postlumpectomy changes left breast. No new masses, calcifications or distortion identified. There is dense tissue identified within the upper-outer left breast. Targeted ultrasound is performed, showing dense tissue without suspicious mass within the upper-outer left breast. IMPRESSION: No mammographic evidence for malignancy. Postlumpectomy changes left breast. RECOMMENDATION: Screening mammography 1 year. Given history of new malignancy within the liver, if there is persistent concern for breast primary, recommend further evaluation of the breast bilaterally with MRI. I have discussed the findings and recommendations with the patient. If applicable, a reminder letter will be sent to the patient regarding the next appointment. BI-RADS CATEGORY  2: Benign. Electronically Signed   By: Lovey Newcomer M.D.   On: 08/17/2020 16:33   MM DIAG BREAST TOMO BILATERAL  Result Date: 08/17/2020 CLINICAL DATA:  Patient with cancer of unknown primary involving the liver. History of left breast cancer. EXAM: DIGITAL DIAGNOSTIC BILATERAL MAMMOGRAM WITH TOMOSYNTHESIS AND CAD; ULTRASOUND LEFT BREAST LIMITED TECHNIQUE: Bilateral digital diagnostic mammography and breast tomosynthesis was performed. The images were evaluated with computer-aided detection.; Targeted ultrasound examination of the left breast was performed COMPARISON:  Previous exam(s). ACR Breast Density Category c: The breast tissue is heterogeneously dense, which may obscure small masses. FINDINGS: Stable postlumpectomy changes left breast. No new masses, calcifications or distortion identified. There is dense tissue identified within the upper-outer left breast. Targeted ultrasound is performed, showing dense tissue without  suspicious mass within the upper-outer left breast. IMPRESSION: No mammographic evidence for malignancy. Postlumpectomy changes left breast. RECOMMENDATION: Screening mammography 1 year. Given history of new malignancy within the liver, if there is persistent concern for breast primary, recommend further evaluation of the breast bilaterally with MRI. I have discussed the findings and recommendations with the patient. If applicable, a reminder letter will be sent to the patient regarding the next appointment. BI-RADS CATEGORY  2: Benign. Electronically Signed   By: Lovey Newcomer M.D.   On: 08/17/2020 16:33   US Abdomen Limited RUQ (LIVER/GB)  Result Date: 08/09/2020 CLINICAL DATA:  Cholelithiasis Breast cancer EXAM: ULTRASOUND ABDOMEN LIMITED RIGHT UPPER QUADRANT COMPARISON:  CT chest, abdomen, and pelvis 08/07/2020 FINDINGS: Gallbladder: Sludge and stones noted within the gallbladder lumen. Mild thickening of the gallbladder wall. Sonographic Murphy sign negative per technologist. No pericholecystic fluid. Common bile duct: Diameter: 3 mm Liver: Nodular hepatic contours. Numerous hepatic masses again seen, most likely due to metastatic disease. Portal vein is patent on color Doppler imaging with normal direction of blood flow towards the liver. Other: None. IMPRESSION: 1. Cholelithiasis. Mild gallbladder wall thickening may be due to cholecystitis or reactive to liver disease. 2. Innumerable liver lesions consistent with metastatic disease. Electronically Signed   By: Miachel Roux M.D.   On: 08/09/2020 07:56    ASSESSMENT & PLAN:  80 y.o. female with  1.  Newly diagnosed metastatic breast cancer with liver mets, bone metastases and nodal metastases 2.  Failure to thrive with very poor p.o. intake 3.  Severe hypokalemia with muscle weakness. 4. acute kidney injury likely related to dehydration diuretics. 5. 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  6. 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 S/p Lumpectomy, Radiation, 4 cycles of Cytoxan and Taxotere. Completed maintenance Arimidex until Fall 2019 Continues with annual mammograms with OBGYN Dr. Donalynn Furlong.  7) ER+ HR- Her2 - Metastatic Breast Carcinoma  PLAN: -Discussed pt labwork today, 08/24/2020; liver and kidney enzymes continue to elevate. -Advised pt that this time it is hormone negative and Her2 negative. It is estrogen positive (95%). The subtype can be more aggressive when progesterone receptor negative. -Advised pt that we discussed with colleagues and they all did not recommend chemotherapy. We will initiate the combination of Faslodex and Xgeva. .-Advised pt the shot in buttock is the Faslodex estrogen receptor modulator.This will be every 2 weeks for times then once each month after that. -Advised pt the Verzenio is the pill that is a cyclin dependant kinase inhibitor. (CDK inhibitor) This is the cell cycle checkpoint inhibitor. -Will start with 100 mg BID Verzenio. Will work up towards 150 mg BID. -Advised pt the shot in the arm every four weeks is the Xgeva for bone strengthening. -Will decrease Hydroxyurea 500 mg dosage to three days weekly (M,W,F). -Advised pt we will decrease Dexamethasone to once daily in 2 weeks. Keep at BID until then. -Will Rx Lorazepam for use as needed for sleeping and anxiety. -Refill KCL, Zofran( 8 mg), and Dexamethasone. -Continue at same dosage of Xarelto (10 mg daily). Will continue to monitor this based on kidney and liver numbers. -Advised pt that her tremors is most likely due to anxiety, weight loss, and Dexamethasone. -Continue to avoid liver toxic medications. Avoid NSAIDs. -Continue Potassium. Can increase to BID if pt is not eating well. -Recommended pt increase her  daily intake of water. Emphasized importance of this to avoid IV fluids. -Will set up for IV fluids today. -Will see back in 1 weeks with labs for toxicity check.     FOLLOW UP: RTC with Dr Irene Limbo with labs on 6/2 end of the day and for 1 L of IVF.   The total time spent in the appointment was 20 minutes and more than 50% was on counseling and direct patient cares.   All of the patient's questions were answered with apparent satisfaction. The patient knows to call the clinic with any problems, questions or concerns.   Sullivan Lone MD [place] AAHIVMS [facility] Hematology/Oncology Physician [facility]  (Office):       [phone] (Work cell):  [phone] (Fax):           [phone]  08/24/2020 11:49 AM  I, Reinaldo Raddle, am acting as scribe for Dr. Sullivan Lone, MD.  .I have reviewed the above documentation for accuracy and completeness, and I agree with the above.' .Brunetta Genera MD

## 2020-08-24 ENCOUNTER — Inpatient Hospital Stay (HOSPITAL_BASED_OUTPATIENT_CLINIC_OR_DEPARTMENT_OTHER): Payer: Medicare Other | Admitting: Hematology

## 2020-08-24 ENCOUNTER — Ambulatory Visit: Payer: Medicare Other | Admitting: Nurse Practitioner

## 2020-08-24 ENCOUNTER — Inpatient Hospital Stay: Payer: Medicare Other

## 2020-08-24 ENCOUNTER — Other Ambulatory Visit: Payer: Self-pay

## 2020-08-24 VITALS — BP 118/81 | HR 87 | Temp 97.4°F | Resp 18 | Ht 59.0 in | Wt 90.4 lb

## 2020-08-24 DIAGNOSIS — C7951 Secondary malignant neoplasm of bone: Secondary | ICD-10-CM

## 2020-08-24 DIAGNOSIS — C50919 Malignant neoplasm of unspecified site of unspecified female breast: Secondary | ICD-10-CM

## 2020-08-24 DIAGNOSIS — Z170 Estrogen receptor positive status [ER+]: Secondary | ICD-10-CM | POA: Diagnosis not present

## 2020-08-24 DIAGNOSIS — Z5111 Encounter for antineoplastic chemotherapy: Secondary | ICD-10-CM | POA: Diagnosis not present

## 2020-08-24 DIAGNOSIS — C787 Secondary malignant neoplasm of liver and intrahepatic bile duct: Secondary | ICD-10-CM | POA: Diagnosis not present

## 2020-08-24 DIAGNOSIS — D473 Essential (hemorrhagic) thrombocythemia: Secondary | ICD-10-CM | POA: Diagnosis not present

## 2020-08-24 LAB — CMP (CANCER CENTER ONLY)
ALT: 219 U/L — ABNORMAL HIGH (ref 0–44)
AST: 220 U/L (ref 15–41)
Albumin: 3.1 g/dL — ABNORMAL LOW (ref 3.5–5.0)
Alkaline Phosphatase: 323 U/L — ABNORMAL HIGH (ref 38–126)
Anion gap: 15 (ref 5–15)
BUN: 41 mg/dL — ABNORMAL HIGH (ref 8–23)
CO2: 22 mmol/L (ref 22–32)
Calcium: 9.1 mg/dL (ref 8.9–10.3)
Chloride: 101 mmol/L (ref 98–111)
Creatinine: 1.46 mg/dL — ABNORMAL HIGH (ref 0.44–1.00)
GFR, Estimated: 37 mL/min — ABNORMAL LOW (ref >60)
Glucose, Bld: 132 mg/dL — ABNORMAL HIGH (ref 70–99)
Potassium: 3.5 mmol/L (ref 3.5–5.1)
Sodium: 138 mmol/L (ref 135–145)
Total Bilirubin: 0.8 mg/dL (ref 0.3–1.2)
Total Protein: 6.3 g/dL — ABNORMAL LOW (ref 6.5–8.1)

## 2020-08-24 LAB — CBC WITH DIFFERENTIAL (CANCER CENTER ONLY)
Abs Immature Granulocytes: 0.13 10*3/uL — ABNORMAL HIGH (ref 0.00–0.07)
Basophils Absolute: 0 10*3/uL (ref 0.0–0.1)
Basophils Relative: 0 %
Eosinophils Absolute: 0 10*3/uL (ref 0.0–0.5)
Eosinophils Relative: 0 %
HCT: 43.9 % (ref 36.0–46.0)
Hemoglobin: 13.4 g/dL (ref 12.0–15.0)
Immature Granulocytes: 1 %
Lymphocytes Relative: 3 %
Lymphs Abs: 0.3 10*3/uL — ABNORMAL LOW (ref 0.7–4.0)
MCH: 26.7 pg (ref 26.0–34.0)
MCHC: 30.5 g/dL (ref 30.0–36.0)
MCV: 87.5 fL (ref 80.0–100.0)
Monocytes Absolute: 0.2 10*3/uL (ref 0.1–1.0)
Monocytes Relative: 2 %
Neutro Abs: 11.9 10*3/uL — ABNORMAL HIGH (ref 1.7–7.7)
Neutrophils Relative %: 94 %
Platelet Count: 178 10*3/uL (ref 150–400)
RBC: 5.02 MIL/uL (ref 3.87–5.11)
RDW: 19.6 % — ABNORMAL HIGH (ref 11.5–15.5)
WBC Count: 12.6 10*3/uL — ABNORMAL HIGH (ref 4.0–10.5)
nRBC: 0.3 % — ABNORMAL HIGH (ref 0.0–0.2)

## 2020-08-24 MED ORDER — DENOSUMAB 120 MG/1.7ML ~~LOC~~ SOLN
120.0000 mg | Freq: Once | SUBCUTANEOUS | Status: AC
  Administered 2020-08-24: 120 mg via SUBCUTANEOUS

## 2020-08-24 MED ORDER — FULVESTRANT 250 MG/5ML IM SOLN
INTRAMUSCULAR | Status: AC
  Filled 2020-08-24: qty 10

## 2020-08-24 MED ORDER — DENOSUMAB 120 MG/1.7ML ~~LOC~~ SOLN
SUBCUTANEOUS | Status: AC
  Filled 2020-08-24: qty 1.7

## 2020-08-24 MED ORDER — FULVESTRANT 250 MG/5ML IM SOLN
500.0000 mg | Freq: Once | INTRAMUSCULAR | Status: AC
  Administered 2020-08-24: 500 mg via INTRAMUSCULAR

## 2020-08-24 NOTE — Patient Instructions (Signed)
Denosumab injection What is this medicine? DENOSUMAB (den oh sue mab) slows bone breakdown. Prolia is used to treat osteoporosis in women after menopause and in men, and in people who are taking corticosteroids for 6 months or more. Xgeva is used to treat a high calcium level due to cancer and to prevent bone fractures and other bone problems caused by multiple myeloma or cancer bone metastases. Xgeva is also used to treat giant cell tumor of the bone. This medicine may be used for other purposes; ask your health care provider or pharmacist if you have questions. COMMON BRAND NAME(S): Prolia, XGEVA What should I tell my health care provider before I take this medicine? They need to know if you have any of these conditions:  dental disease  having surgery or tooth extraction  infection  kidney disease  low levels of calcium or Vitamin D in the blood  malnutrition  on hemodialysis  skin conditions or sensitivity  thyroid or parathyroid disease  an unusual reaction to denosumab, other medicines, foods, dyes, or preservatives  pregnant or trying to get pregnant  breast-feeding How should I use this medicine? This medicine is for injection under the skin. It is given by a health care professional in a hospital or clinic setting. A special MedGuide will be given to you before each treatment. Be sure to read this information carefully each time. For Prolia, talk to your pediatrician regarding the use of this medicine in children. Special care may be needed. For Xgeva, talk to your pediatrician regarding the use of this medicine in children. While this drug may be prescribed for children as young as 13 years for selected conditions, precautions do apply. Overdosage: If you think you have taken too much of this medicine contact a poison control center or emergency room at once. NOTE: This medicine is only for you. Do not share this medicine with others. What if I miss a dose? It is  important not to miss your dose. Call your doctor or health care professional if you are unable to keep an appointment. What may interact with this medicine? Do not take this medicine with any of the following medications:  other medicines containing denosumab This medicine may also interact with the following medications:  medicines that lower your chance of fighting infection  steroid medicines like prednisone or cortisone This list may not describe all possible interactions. Give your health care provider a list of all the medicines, herbs, non-prescription drugs, or dietary supplements you use. Also tell them if you smoke, drink alcohol, or use illegal drugs. Some items may interact with your medicine. What should I watch for while using this medicine? Visit your doctor or health care professional for regular checks on your progress. Your doctor or health care professional may order blood tests and other tests to see how you are doing. Call your doctor or health care professional for advice if you get a fever, chills or sore throat, or other symptoms of a cold or flu. Do not treat yourself. This drug may decrease your body's ability to fight infection. Try to avoid being around people who are sick. You should make sure you get enough calcium and vitamin D while you are taking this medicine, unless your doctor tells you not to. Discuss the foods you eat and the vitamins you take with your health care professional. See your dentist regularly. Brush and floss your teeth as directed. Before you have any dental work done, tell your dentist you are   receiving this medicine. Do not become pregnant while taking this medicine or for 5 months after stopping it. Talk with your doctor or health care professional about your birth control options while taking this medicine. Women should inform their doctor if they wish to become pregnant or think they might be pregnant. There is a potential for serious side  effects to an unborn child. Talk to your health care professional or pharmacist for more information. What side effects may I notice from receiving this medicine? Side effects that you should report to your doctor or health care professional as soon as possible:  allergic reactions like skin rash, itching or hives, swelling of the face, lips, or tongue  bone pain  breathing problems  dizziness  jaw pain, especially after dental work  redness, blistering, peeling of the skin  signs and symptoms of infection like fever or chills; cough; sore throat; pain or trouble passing urine  signs of low calcium like fast heartbeat, muscle cramps or muscle pain; pain, tingling, numbness in the hands or feet; seizures  unusual bleeding or bruising  unusually weak or tired Side effects that usually do not require medical attention (report to your doctor or health care professional if they continue or are bothersome):  constipation  diarrhea  headache  joint pain  loss of appetite  muscle pain  runny nose  tiredness  upset stomach This list may not describe all possible side effects. Call your doctor for medical advice about side effects. You may report side effects to FDA at 1-800-FDA-1088. Where should I keep my medicine? This medicine is only given in a clinic, doctor's office, or other health care setting and will not be stored at home. NOTE: This sheet is a summary. It may not cover all possible information. If you have questions about this medicine, talk to your doctor, pharmacist, or health care provider.  2021 Elsevier/Gold Standard (2017-07-24 16:10:44)   Fulvestrant injection What is this medicine? FULVESTRANT (ful VES trant) blocks the effects of estrogen. It is used to treat breast cancer. This medicine may be used for other purposes; ask your health care provider or pharmacist if you have questions. COMMON BRAND NAME(S): FASLODEX What should I tell my health care  provider before I take this medicine? They need to know if you have any of these conditions:  bleeding disorders  liver disease  low blood counts, like low white cell, platelet, or red cell counts  an unusual or allergic reaction to fulvestrant, other medicines, foods, dyes, or preservatives  pregnant or trying to get pregnant  breast-feeding How should I use this medicine? This medicine is for injection into a muscle. It is usually given by a health care professional in a hospital or clinic setting. Talk to your pediatrician regarding the use of this medicine in children. Special care may be needed. Overdosage: If you think you have taken too much of this medicine contact a poison control center or emergency room at once. NOTE: This medicine is only for you. Do not share this medicine with others. What if I miss a dose? It is important not to miss your dose. Call your doctor or health care professional if you are unable to keep an appointment. What may interact with this medicine?  medicines that treat or prevent blood clots like warfarin, enoxaparin, dalteparin, apixaban, dabigatran, and rivaroxaban This list may not describe all possible interactions. Give your health care provider a list of all the medicines, herbs, non-prescription drugs, or dietary supplements  Also tell them if you smoke, drink alcohol, or use illegal drugs. Some items may interact with your medicine. What should I watch for while using this medicine? Your condition will be monitored carefully while you are receiving this medicine. You will need important blood work done while you are taking this medicine. Do not become pregnant while taking this medicine or for at least 1 year after stopping it. Women of child-bearing potential will need to have a negative pregnancy test before starting this medicine. Women should inform their doctor if they wish to become pregnant or think they might be pregnant. There is  a potential for serious side effects to an unborn child. Men should inform their doctors if they wish to father a child. This medicine may lower sperm counts. Talk to your health care professional or pharmacist for more information. Do not breast-feed an infant while taking this medicine or for 1 year after the last dose. What side effects may I notice from receiving this medicine? Side effects that you should report to your doctor or health care professional as soon as possible:  allergic reactions like skin rash, itching or hives, swelling of the face, lips, or tongue  feeling faint or lightheaded, falls  pain, tingling, numbness, or weakness in the legs  signs and symptoms of infection like fever or chills; cough; flu-like symptoms; sore throat  vaginal bleeding Side effects that usually do not require medical attention (report to your doctor or health care professional if they continue or are bothersome):  aches, pains  constipation  diarrhea  headache  hot flashes  nausea, vomiting  pain at site where injected  stomach pain This list may not describe all possible side effects. Call your doctor for medical advice about side effects. You may report side effects to FDA at 1-800-FDA-1088. Where should I keep my medicine? This drug is given in a hospital or clinic and will not be stored at home. NOTE: This sheet is a summary. It may not cover all possible information. If you have questions about this medicine, talk to your doctor, pharmacist, or health care provider.  2021 Elsevier/Gold Standard (2017-06-25 11:34:41)  

## 2020-08-25 ENCOUNTER — Other Ambulatory Visit: Payer: Self-pay

## 2020-08-25 ENCOUNTER — Inpatient Hospital Stay: Payer: Medicare Other

## 2020-08-25 ENCOUNTER — Other Ambulatory Visit (HOSPITAL_COMMUNITY): Payer: Self-pay

## 2020-08-25 VITALS — BP 121/80 | HR 72 | Temp 97.6°F | Resp 18

## 2020-08-25 DIAGNOSIS — C7951 Secondary malignant neoplasm of bone: Secondary | ICD-10-CM | POA: Diagnosis not present

## 2020-08-25 DIAGNOSIS — C50919 Malignant neoplasm of unspecified site of unspecified female breast: Secondary | ICD-10-CM | POA: Diagnosis not present

## 2020-08-25 DIAGNOSIS — D473 Essential (hemorrhagic) thrombocythemia: Secondary | ICD-10-CM | POA: Diagnosis not present

## 2020-08-25 DIAGNOSIS — C787 Secondary malignant neoplasm of liver and intrahepatic bile duct: Secondary | ICD-10-CM | POA: Diagnosis not present

## 2020-08-25 DIAGNOSIS — Z5111 Encounter for antineoplastic chemotherapy: Secondary | ICD-10-CM | POA: Diagnosis not present

## 2020-08-25 DIAGNOSIS — Z170 Estrogen receptor positive status [ER+]: Secondary | ICD-10-CM | POA: Diagnosis not present

## 2020-08-25 MED ORDER — SODIUM CHLORIDE 0.9 % IV SOLN
Freq: Once | INTRAVENOUS | Status: AC
  Filled 2020-08-25: qty 250

## 2020-08-25 NOTE — Patient Instructions (Signed)
Rehydration, Adult Rehydration is the replacement of body fluids, salts, and minerals (electrolytes) that are lost during dehydration. Dehydration is when there is not enough water or other fluids in the body. This happens when you lose more fluids than you take in. Common causes of dehydration include:  Not drinking enough fluids. This can occur when you are ill or doing activities that require a lot of energy, especially in hot weather.  Conditions that cause loss of water or other fluids, such as diarrhea, vomiting, sweating, or urinating a lot.  Other illnesses, such as fever or infection.  Certain medicines, such as those that remove excess fluid from the body (diuretics). Symptoms of mild or moderate dehydration may include thirst, dry lips and mouth, and dizziness. Symptoms of severe dehydration may include increased heart rate, confusion, fainting, and not urinating. For severe dehydration, you may need to get fluids through an IV at the hospital. For mild or moderate dehydration, you can usually rehydrate at home by drinking certain fluids as told by your health care provider. What are the risks? Generally, rehydration is safe. However, taking in too much fluid (overhydration) can be a problem. This is rare. Overhydration can cause an electrolyte imbalance, kidney failure, or a decrease in salt (sodium) levels in the body. Supplies needed You will need an oral rehydration solution (ORS) if your health care provider tells you to use one. This is a drink to treat dehydration. It can be found in pharmacies and retail stores. How to rehydrate Fluids Follow instructions from your health care provider for rehydration. The kind of fluid and the amount you should drink depend on your condition. In general, you should choose drinks that you prefer.  If told by your health care provider, drink an ORS. ? Make an ORS by following instructions on the package. ? Start by drinking small amounts,  about  cup (120 mL) every 5-10 minutes. ? Slowly increase how much you drink until you have taken the amount recommended by your health care provider.  Drink enough clear fluids to keep your urine pale yellow. If you were told to drink an ORS, finish it first, then start slowly drinking other clear fluids. Drink fluids such as: ? Water. This includes sparkling water and flavored water. Drinking only water can lead to having too little sodium in your body (hyponatremia). Follow the advice of your health care provider. ? Water from ice chips you suck on. ? Fruit juice with water you add to it (diluted). ? Sports drinks. ? Hot or cold herbal teas. ? Broth-based soups. ? Milk or milk products. Food Follow instructions from your health care provider about what to eat while you rehydrate. Your health care provider may recommend that you slowly begin eating regular foods in small amounts.  Eat foods that contain a healthy balance of electrolytes, such as bananas, oranges, potatoes, tomatoes, and spinach.  Avoid foods that are greasy or contain a lot of sugar. In some cases, you may get nutrition through a feeding tube that is passed through your nose and into your stomach (nasogastric tube, or NG tube). This may be done if you have uncontrolled vomiting or diarrhea.   Beverages to avoid Certain beverages may make dehydration worse. While you rehydrate, avoid drinking alcohol.   How to tell if you are recovering from dehydration You may be recovering from dehydration if:  You are urinating more often than before you started rehydrating.  Your urine is pale yellow.  Your energy level   improves.  You vomit less frequently.  You have diarrhea less frequently.  Your appetite improves or returns to normal.  You feel less dizzy or less light-headed.  Your skin tone and color start to look more normal. Follow these instructions at home:  Take over-the-counter and prescription medicines only  as told by your health care provider.  Do not take sodium tablets. Doing this can lead to having too much sodium in your body (hypernatremia). Contact a health care provider if:  You continue to have symptoms of mild or moderate dehydration, such as: ? Thirst. ? Dry lips. ? Slightly dry mouth. ? Dizziness. ? Dark urine or less urine than normal. ? Muscle cramps.  You continue to vomit or have diarrhea. Get help right away if you:  Have symptoms of dehydration that get worse.  Have a fever.  Have a severe headache.  Have been vomiting and the following happens: ? Your vomiting gets worse or does not go away. ? Your vomit includes blood or green matter (bile). ? You cannot eat or drink without vomiting.  Have problems with urination or bowel movements, such as: ? Diarrhea that gets worse or does not go away. ? Blood in your stool (feces). This may cause stool to look black and tarry. ? Not urinating, or urinating only a small amount of very dark urine, within 6-8 hours.  Have trouble breathing.  Have symptoms that get worse with treatment. These symptoms may represent a serious problem that is an emergency. Do not wait to see if the symptoms will go away. Get medical help right away. Call your local emergency services (911 in the [place] Do not drive yourself to the hospital. Summary  Rehydration is the replacement of body fluids and minerals (electrolytes) that are lost during dehydration.  Follow instructions from your health care provider for rehydration. The kind of fluid and amount you should drink depend on your condition.  Slowly increase how much you drink until you have taken the amount recommended by your health care provider.  Contact your health care provider if you continue to show signs of mild or moderate dehydration. This information is not intended to replace advice given to you by your health care provider. Make sure you discuss any questions you have with  your health care provider. Document Revised: 05/18/2019 Document Reviewed: 03/28/2019 Elsevier Patient Education  2021 [employer]vier Inc.  

## 2020-08-28 ENCOUNTER — Other Ambulatory Visit: Payer: Self-pay | Admitting: Hematology

## 2020-08-28 MED ORDER — LORAZEPAM 0.5 MG PO TABS: 0.5000 mg | ORAL_TABLET | Freq: Three times a day (TID) | ORAL | 0 refills | Status: DC

## 2020-08-28 MED ORDER — DEXAMETHASONE 1 MG PO TABS: ORAL_TABLET | ORAL | 0 refills | Status: DC

## 2020-08-28 MED ORDER — POTASSIUM CHLORIDE ER 8 MEQ PO TBCR: 8.0000 meq | EXTENDED_RELEASE_TABLET | Freq: Two times a day (BID) | ORAL | 3 refills | Status: DC

## 2020-08-28 MED ORDER — ONDANSETRON HCL 4 MG PO TABS: 4.0000 mg | ORAL_TABLET | Freq: Four times a day (QID) | ORAL | 3 refills | Status: DC | PRN

## 2020-08-28 MED ORDER — RIVAROXABAN 10 MG PO TABS: 10.0000 mg | ORAL_TABLET | Freq: Every day | ORAL | 3 refills | Status: DC

## 2020-08-28 NOTE — Telephone Encounter (Addendum)
Oral Chemotherapy Pharmacist Encounter   Spoke with patient and patient's husband today to follow up regarding patient's oral chemotherapy medication: Verzenio (abemaciclib)  Patient has not yet received a phone call from [facility] regarding first fill of Verzenio. Patient and husband provided with phone number to set up medication shipment through [facility] [phone]). Also provided Oral Chemotherapy Clinic Phone number for any questions or concerns that may come up in the meantime.   Patient and husband will call and notify us once they have received medication from [facility].   Leron Croak, PharmD, BCPS Hematology/Oncology Clinical Pharmacist [facility] [phone] 08/28/2020 10:40 AM

## 2020-08-29 ENCOUNTER — Telehealth: Payer: Self-pay | Admitting: Hematology

## 2020-08-29 ENCOUNTER — Other Ambulatory Visit: Payer: Self-pay

## 2020-08-29 DIAGNOSIS — C50919 Malignant neoplasm of unspecified site of unspecified female breast: Secondary | ICD-10-CM

## 2020-08-29 NOTE — Telephone Encounter (Signed)
Scheduled follow-up appointment per 5/27 los. Patient is aware. 

## 2020-08-29 NOTE — Progress Notes (Signed)
HEMATOLOGY/ONCOLOGY CLINIC NOTE  Date of Service: 08/30/2020  Patient Care Team: Gaynelle Arabian, MD as PCP - General (Family Medicine) Skeet Latch, MD as PCP - Cardiology (Cardiology) Annia Belt, MD as Consulting Physician (Oncology) Rolm Bookbinder, MD as Consulting Physician (General Surgery)  CHIEF COMPLAINTS/PURPOSE OF CONSULTATION:  Essential Thrombocytosis Newly metastatic breast cancer  HISTORY OF PRESENTING ILLNESS:   Gwendolyn Williams is a wonderful 80 y.o. female who has been referred to [place] by Dr. Gaynelle Arabian, and was previously under the care of Dr. 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 The pt reports that she is doing well overall.   The pt reports that she has seen Dr. Michail Sermon in GI and is concerned for esophageal spasm but does not wish to proceed with the diagnostic work up with esophageal manometry. She notes that muscle relaxants help with this occasional pain. She also follows up with Dr. Donne Hazel in surgery to determine if this related to her previous abdominal surgeries. 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  The pt notes that she does not have any other present concerns. She notes that she had SOB at the end of last year, as part of bronchiectasis, and takes Memory Dance once a day, denies needing her rescue inhaler. She denies positional elements to her SOB.  The pt notes that she was off Hyxdroxyurea during the Fall of 2019 during this work up. She then returned to $RemoveBef'500mg'mPuGFNmiWa$  Hydoxyurea once a day and her PLT have normalized. She takes this in the morning with her food and denies any problems tolerating this medication.   The pt denies any concerns for bleeding at this time.  The pt continues with annual mammograms with her OBGYN Dr. Donalynn Furlong. The pt pursued maintenance Arimidex for 8.5 years,  and stopped during the evaluation of her respiratory symptoms in Fall 2019. The pt completes a bone density study every year, and notes she has stable osteopenia. She takes Vitamin D replacement and notes her levels are good.  She continues on $RemoveBefo'20mg'wHmlSBpWGRR$  Xarelto. She continues on $RemoveBefo'81mg'gAVHfzJNdbn$  aspirin as well.  The pt notes that she has had chronic mouth ulcers for "decades." She has tried Vitamin B complex and mouthwashes. She notes that she routinely gets mouth ulcers after her dental cleanings as well.  Most recent lab results (06/22/18) of CBC w/diff and CMP is as follows: all values are WNL except for RDW at 25.2, Potassium at 3.3, BUN at 30, Creatinine at 1.23, GFR at 43.  On review of systems, pt reports intermittent esophageal discomfort, stable breathing, good energy levels, some abdominal tenderness, chronic mouth ulcers, and denies concerns for bleeding, and any other symptoms.  Interval History:   Gwendolyn Williams returns today for management and evaluation of newly diagnosed metastatic ER+ PR -ve her2 neg breast cancer with liver metastases. The patient's last visit with [place] was on 08/24/2020. The pt reports that she is doing well overall. We are joined today by her husband.  The pt reports that she has been trying to eat more and has been eating better. She notes the Verzenio came in today and she will start this tomorrow morning. The pt notes no issues tolerating the Faslodex shot. She notes she has also gained two pounds.  Lab results today 08/30/2020 of CBC w/diff and CMP is as follows: all values are WNL except  for RDW of 21.5, Plt of 134K, Neutro Abs of 9.2K, Lymphs Abs of 0.2K, Sodium of 134, CO2 of 17, Glucose of 123, BUN of 26, Creatinine of 1.13, Calcium of 8.0, Total Protein of 5.8, Albumin of 2.8, AST of 191, ALT of 319, Alkaline Phosphatase of 387.  On review of systems, pt reports fatigue, weakness, difficulty sleeping, continued abdominal distention and denies fevers, chills, night sweats,  worsened appetite, weight loss, abdominal pain, leg swelling, and any other symptoms.  MEDICAL HISTORY:  Past Medical History:  Diagnosis Date  . Arthritis   . Asthma    controlled with singulair  . Breast cancer ([place])    stage I left breast  . Current use of long term anticoagulation 04/21/2017  . Dyspnea, unspecified 02/24/2017   Started coincident with [place], exertional and orthostatic, normal exam. O2 sat 100%.  . Family history of breast cancer   . GERD (gastroesophageal reflux disease)   . H/O blood clots    clotting disorder since 2010  . Hematoma of arm 08/09/2012  . 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 Hyperlipidemia   . Hypertension   . 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 Pneumonia    viral pneumonia as a kid  . PONV (postoperative nausea and vomiting)     SURGICAL HISTORY: Past Surgical History:  Procedure Laterality Date  . BREAST SURGERY     Left breast lumpectomy sentinel node biopsy  . CERVICAL SPINE SURGERY  05/23/2008  . COLECTOMY  04/11/2008   left colectomy with end colostomy due to clot  . COLOSTOMY TAKEDOWN  08/14/2008  . EYE SURGERY     bil cataracts  . HERNIA REPAIR  2011   [facility]  . INTRAVASCULAR PRESSURE WIRE/FFR STUDY N/A 01/25/2018   Procedure: INTRAVASCULAR PRESSURE WIRE/FFR STUDY;  Surgeon: Nelva Bush, MD;  Location: [facility];  Service: Cardiovascular;  Laterality: N/A;  . LEFT HEART CATH AND CORONARY ANGIOGRAPHY N/A 01/25/2018   Procedure: LEFT HEART CATH AND CORONARY ANGIOGRAPHY;  Surgeon: Nelva Bush, MD;  Location: [facility];  Service: Cardiovascular;  Laterality: N/A;  . LUMBAR [place] SURGERY  2006  . TOTAL HIP ARTHROPLASTY Left 10/07/2016   Procedure: LEFT TOTAL HIP ARTHROPLASTY ANTERIOR APPROACH;  Surgeon: Paralee Cancel, MD;  Location: [facility];  Service: Orthopedics;  Laterality: Left;  70 mins    SOCIAL HISTORY: Social  History   Socioeconomic History  . Marital status: Married    Spouse name: Not on file  . Number of children: Not on file  . Years of education: Not on file  . Highest education level: Not on file  Occupational History  . Not on file  Tobacco Use  . Smoking status: Former Smoker    Packs/day: 1.00    Years: 5.00    Pack years: 5.00    Start date: 36    Quit date: 1964    Years since quitting: 58.4  . Smokeless tobacco: Never Used  Vaping Use  . Vaping Use: Never used  Substance and Sexual Activity  . Alcohol use: Yes    Alcohol/week: 0.0 standard drinks    Comment: Rare  . Drug use: No  . Sexual activity: Not Currently    Partners: Male    Comment: 1st intercourse 80 yo-Fewer than 5 partners  Other Topics Concern  . Not on file  Social History Narrative  . Not on  file   Social Determinants of Health   Financial Resource Strain: Not on file  Food Insecurity: Not on file  Transportation Needs: Not on file  Physical Activity: Not on file  Stress: Not on file  Social Connections: Not on file  Intimate Partner Violence: Not on file    FAMILY HISTORY: Family History  Problem Relation Age of Onset  . Breast cancer Mother 74  . Lung cancer Mother   . Heart disease Father   . Basal cell carcinoma Father        x2  . CAD Father   . Ovarian cysts Sister   . Dementia Sister   . Breast cancer Maternal Aunt 90  . Breast cancer Paternal Aunt 58  . Ovarian cysts Maternal Grandmother        possibly had ovarian cancer as well  . Breast cancer Maternal Aunt 70  . Cancer Other        either pancreatic or colon  . Other Daughter 11       breast lumpectomy- complex sclerosing lesion  . Polycystic ovary syndrome Daughter   . Bladder Cancer Cousin     ALLERGIES:  is allergic to lipitor [atorvastatin calcium], penicillins, sulfa antibiotics, and tamiflu [oseltamivir phosphate].  MEDICATIONS:  Current Outpatient Medications  Medication Sig Dispense Refill  .  abemaciclib (VERZENIO) 100 MG tablet Take 1 tablet (100 mg total) by mouth 2 (two) times daily. Swallow tablets whole. Do not chew, crush, or split tablets before swallowing. 60 tablet 1  . acetaminophen (TYLENOL) 500 MG tablet Take 500 mg by mouth every 6 (six) hours as needed for moderate pain.    Marland Kitchen albuterol (VENTOLIN HFA) 108 (90 Base) MCG/ACT inhaler Inhale 2 puffs into the lungs every 6 (six) hours as needed for wheezing or shortness of breath. 18 g 3  . amLODipine (NORVASC) 2.5 MG tablet Take 2.5 mg by mouth daily.    . Biotin 10 MG TABS Take 1 tablet by mouth daily.    Marland Kitchen buPROPion (WELLBUTRIN XL) 150 MG 24 hr tablet Take 1 tablet by mouth every morning.    . Cholecalciferol (VITAMIN D) 2000 units CAPS Take 4,000 Units by mouth daily.     . Coenzyme Q10 (CO Q-10) 100 MG CAPS Take 1 capsule by mouth daily.    Marland Kitchen dexamethasone (DECADRON) 1 MG tablet Take 4 tablets (4 mg total) by mouth daily for 7 days, THEN 3 tablets (3 mg total) daily for 7 days, THEN 2 tablets (2 mg total) daily for 7 days, THEN 1 tablet (1 mg total) daily for 7 days. 70 tablet 0  . docusate sodium (COLACE) 100 MG capsule Take 1 capsule (100 mg total) by mouth 2 (two) times daily. (Patient taking differently: Take 100 mg by mouth daily.) 10 capsule 0  . esomeprazole (NEXIUM) 40 MG capsule Take 40 mg by mouth daily at 6 (six) AM.    . fluticasone furoate-vilanterol (BREO ELLIPTA) 200-25 MCG/INH AEPB USE 1 INHALATION DAILY 811 each 3  . folic acid (FOLVITE) 1 MG tablet Take 1 mg by mouth daily.    . hydroxyurea (HYDREA) 500 MG capsule TAKE ONE CAPSULE BY MOUTH DAILY WITH FOOD TO MINIMIZE GI SIDE EFFECTS (Patient not taking: Reported on 08/25/2020) 90 capsule 0  . hyoscyamine (LEVSIN SL) 0.125 MG SL tablet Take 1 to 3 tablets three times daily as needed (Patient taking differently: Take 0.125 mg by mouth every 6 (six) hours as needed for cramping.) 90 tablet 11  .  LORazepam (ATIVAN) 0.5 MG tablet Take 1 tablet (0.5 mg total) by  mouth every 8 (eight) hours. 30 tablet 0  . ondansetron (ZOFRAN) 4 MG tablet Take 1 tablet (4 mg total) by mouth every 6 (six) hours as needed for nausea. 30 tablet 3  . polyethylene glycol (MIRALAX / GLYCOLAX) packet Take 17 g by mouth 2 (two) times daily. (Patient taking differently: Take 17 g by mouth daily.) 14 each 0  . potassium chloride (KLOR-CON) 8 MEQ tablet Take 1 tablet (8 mEq total) by mouth 2 (two) times daily. 30 tablet 3  . rivaroxaban (XARELTO) 10 MG TABS tablet Take 1 tablet (10 mg total) by mouth daily. 30 tablet 3  . rosuvastatin (CRESTOR) 40 MG tablet Take 1 tablet (40 mg total) by mouth daily. 90 tablet 3  . Spacer/Aero-Holding Chambers DEVI 1 Device by Does not apply route as directed. 1 each 0   No current facility-administered medications for this visit.   Facility-Administered Medications Ordered in Other Visits  Medication Dose Route Frequency Provider Last Rate Last Admin  . 0.9 %  sodium chloride infusion   Intravenous Once Brunetta Genera, MD 500 mL/hr at 08/30/20 1508 New Bag at 08/30/20 1508    REVIEW OF SYSTEMS:   10 Point review of Systems was done is negative except as noted above.  PHYSICAL EXAMINATION: ECOG FS:2 - Symptomatic, <50% confined to bed  There were no vitals filed for this visit. Wt Readings from Last 3 Encounters:  08/30/20 92 lb 4 oz (41.8 kg)  08/24/20 90 lb 6.4 oz (41 kg)  08/16/20 93 lb 6.4 oz (42.4 kg)   There is no height or weight on file to calculate BMI.    Exam was given in infusion.  GENERAL:alert, in no acute distress and comfortable SKIN: no acute rashes, no significant lesions EYES: conjunctiva are pink and non-injected, sclera anicteric OROPHARYNX: MMM, no exudates, no oropharyngeal erythema or ulceration NECK: supple, no JVD LYMPH:  no palpable lymphadenopathy in the cervical, axillary or inguinal regions LUNGS: clear to auscultation b/l with normal respiratory effort HEART: regular rate & rhythm ABDOMEN:   normoactive bowel sounds , non tender, not distended. Extremity: no pedal edema PSYCH: alert & oriented x 3 with fluent speech NEURO: no focal motor/sensory deficits  LABORATORY DATA:  I have reviewed the data as listed  . CBC Latest Ref Rng & Units 08/30/2020 08/24/2020 08/16/2020  WBC 4.0 - 10.5 K/uL 9.8 12.6(H) 14.3(H)  Hemoglobin 12.0 - 15.0 g/dL 12.5 13.4 13.4  Hematocrit 36.0 - 46.0 % 39.8 43.9 43.3  Platelets 150 - 400 K/uL 134(L) 178 353    . CMP Latest Ref Rng & Units 08/24/2020 08/16/2020 08/10/2020  Glucose 70 - 99 mg/dL 132(H) 110(H) 172(H)  BUN 8 - 23 mg/dL 41(H) 28(H) 24(H)  Creatinine 0.44 - 1.00 mg/dL 1.46(H) 1.26(H) 1.46(H)  Sodium 135 - 145 mmol/L 138 140 139  Potassium 3.5 - 5.1 mmol/L 3.5 4.0 4.0  Chloride 98 - 111 mmol/L 101 102 105  CO2 22 - 32 mmol/L $RemoveB'22 26 27  'QQowENFy$ Calcium 8.9 - 10.3 mg/dL 9.1 9.2 9.0  Total Protein 6.5 - 8.1 g/dL 6.3(L) 6.4(L) -  Total Bilirubin 0.3 - 1.2 mg/dL 0.8 0.6 -  Alkaline Phos 38 - 126 U/L 323(H) 212(H) -  AST 15 - 41 U/L 220(HH) 135(H) -  ALT 0 - 44 U/L 219(H) 97(H) -   06/07/08 JAK2:    RADIOGRAPHIC STUDIES: I have personally reviewed the radiological images as listed  and agreed with the findings in the report. MR Brain W Wo Contrast  Result Date: 08/11/2020 CLINICAL DATA:  Staging cancer of unknown primary. EXAM: MRI HEAD WITHOUT AND WITH CONTRAST TECHNIQUE: Multiplanar, multiecho pulse sequences of the brain and surrounding structures were obtained without and with intravenous contrast. CONTRAST:  20mL GADAVIST GADOBUTROL 1 MMOL/ML IV SOLN COMPARISON:  Brain MRI 06/22/2013 FINDINGS: Brain: No enhancement or swelling to suggest metastatic disease. No infarct, hemorrhage, hydrocephalus, or collection. Age normal brain volume and white matter appearance. Vascular: Normal flow voids. Skull and upper cervical spine: Multilevel cervical spine degeneration. Retro dental ligamentous thickening without cervicomedullary compression.  Sinuses/Orbits: Bilateral cataract resection. No pathologic finding. IMPRESSION: No evidence of metastatic disease. Electronically Signed   By: Monte Fantasia M.D.   On: 08/11/2020 08:24   CT CHEST ABDOMEN PELVIS W CONTRAST  Result Date: 08/07/2020 CLINICAL DATA:  3 pound weight loss over the past few weeks. Abdominal distention and nausea. RIGHT LOWER QUADRANT swelling. History of LEFT breast cancer with lumpectomy. History of colon resection. History of smoking. EXAM: CT CHEST, ABDOMEN, AND PELVIS WITH CONTRAST TECHNIQUE: Multidetector CT imaging of the chest, abdomen and pelvis was performed following the standard protocol during bolus administration of intravenous contrast. CONTRAST:  19mL ISOVUE-300 IOPAMIDOL (ISOVUE-300) INJECTION 61% COMPARISON:  02/12/2018 and 11/20/2016 FINDINGS: CT CHEST FINDINGS Cardiovascular: Heart size is normal. There is atherosclerotic calcification of the arteries. There is atherosclerotic calcification of the thoracic aorta not associated with aneurysm. No pericardial effusion. Pulmonary arteries are normally opacified account for the contrast bolus timing. Mediastinum/Nodes: Esophagus is normal. No mediastinal, hilar, or axillary adenopathy. A 5 millimeter nodule is identified within the LEFT lobe of the thyroid gland. Otherwise the thyroid is normal in appearance. Lungs/Pleura: There are emphysematous changes throughout the lungs. No pulmonary nodules, consolidations, or pleural effusions. There is minimal RIGHT apical scarring. Musculoskeletal: Remote LOWER cervical spine fusion. Minimal degenerative changes in the midthoracic spine. No acute fracture or subluxation. No suspicious lytic or blastic lesions. CT ABDOMEN PELVIS FINDINGS Hepatobiliary: Liver size is UPPER limits normal. The liver contains innumerable rim enhancing lesions throughout all lobes. Confluent low-attenuation is identified within the anterior aspect of the RIGHT hepatic lobe. Small amount of  perihepatic fluid is present. The largest, confluent lesion measures approximately 6.5 x 4.5 centimeters. Other more discrete rounded lesions measure from 6 millimeters to 3.0 centimeters. There is a small amount of pericholecystic fluid. Calcified gallstone measures at least 1.6 centimeters. There is enhancement of the mucosa of the gallbladder. Consider further evaluation with gallbladder ultrasound. Pancreas: Unremarkable. No pancreatic ductal dilatation or surrounding inflammatory changes. Spleen: Normal in size without focal abnormality. Adrenals/Urinary Tract: Adrenal glands are normal in appearance. A 1 centimeter simple cyst is identified in UPPER pole of the LEFT kidney. There is symmetric bilateral renal enhancement and early excretion of contrast. The ureters are unremarkable. Distal aspects of the ureters are partially obscured by a over lying artifact. The urinary bladder is otherwise normal in appearance. Stomach/Bowel: The stomach and small bowel loops are normal in appearance. Small bowel loops are normal in appearance. The appendix is not well seen. Central pelvic small bowel loops are partially obscured by metal artifact in the LEFT hip. Colo-colic anastomosis is identified in the LEFT mid abdomen and is distended with a significant amount of stool. No obstructing mass identified. Vascular/Lymphatic: There is atherosclerotic calcification of the abdominal aorta. Portacaval lymph node is 1.1 centimeters. Unknown along the dorsum of the pancreatic neck is 1.1 centimeters. Although  involved by atherosclerosis, there is vascular opacification of the celiac axis, superior mesenteric artery, and inferior mesenteric artery. Normal appearance of the portal venous system and inferior vena cava. Reproductive: The uterus is present.  No adnexal mass. Other: No free pelvic fluid. Anterior abdominal wall is unremarkable. Musculoskeletal: Degenerative changes are seen in the lumbar spine. No lytic or blastic  lesions are noted. Previous LEFT hip arthroplasty. IMPRESSION: 1. Innumerable liver lesions consistent with diffuse metastatic disease. Confluent lesion in the LEFT hepatic lobe is 6.5 centimeters. 2. Cholelithiasis and enhancement of the gallbladder mucosa along with a small amount of pericholecystic fluid raise the question of gallbladder disease. Consider further evaluation gallbladder ultrasound. 3. Previous colon resection with follow-up pelvic anastomosis in the LEFT mid abdomen. 4. Significant stool burden. 5. Minimally enlarged lymph nodes in the periportal region. 6.  Aortic atherosclerosis.  (ICD10-I70.0) 7. No evidence for osseous metastatic disease. 8. Aortic atherosclerosis.  (ICD10-I70.0) 9.  Emphysema (ICD10-J43.9). These results will be called to the ordering clinician or representative by the Radiologist Assistant, and communication documented in the PACS or [employer]. Electronically Signed   By: Nolon Nations M.D.   On: 08/07/2020 13:20   NM PET Image Initial (PI) Skull Base To Thigh  Result Date: 08/14/2020 CLINICAL DATA:  Initial treatment strategy for cancer of unknown primary. EXAM: NUCLEAR MEDICINE PET SKULL BASE TO THIGH TECHNIQUE: 5.06 mCi F-18 FDG was injected intravenously. Full-ring PET imaging was performed from the skull base to thigh after the radiotracer. CT data was obtained and used for attenuation correction and anatomic localization. Fasting blood glucose: 108 mg/dl COMPARISON:  Chest abdomen pelvis CT from Aug 07, 2020. FINDINGS: Mediastinal blood pool activity: SUV max 1.74 Liver activity: SUV max NA NECK: LEFT level IV a lymph node at the thoracic inlet (image 41/4) 7 mm short axis with a maximum SUV of 4.7 no additional signs of adenopathy in the neck. Incidental CT findings: none CHEST: No hypermetabolic mediastinal or hilar nodes. No suspicious pulmonary nodules on the CT scan. Incidental CT findings: Aortic atherosclerosis. Normal heart size. Three-vessel  coronary artery disease. No substantial pericardial fluid. Normal caliber central pulmonary vessels. Limited assessment of cardiovascular structures given lack of intravenous contrast. Mild biapical pleural and parenchymal scarring. Mild basilar atelectasis. No suspicious nodule, consolidation or substantial pleural effusion. Airways are patent. ABDOMEN/PELVIS: Diffuse hepatic metastatic disease, largest area in the dome of the RIGHT hemi liver measuring in total near 10 x 6 cm on image 90 of series 4 and as much is 11 x 6 cm on image 94 of series 4. All areas, subsegments of the liver are involved. Maximum SUV obtained in the liver in the dominant lesion is 8.7 Celiac adenopathy better seen on the previous CT (image 100/4) short axis measurement 11 mm with a maximum SUV of 6.2 Mild increased FDG uptake in the intra-aortocaval groove and along the LEFT para aortic chain (image 105/4) 9 mm lymph node with a maximum SUV of 6.6. No additional solid organ or nodal uptake in the abdomen. RIGHT groin lymph node with a maximum SUV of 5.4 on image 151 of series 4 measuring approximately 6 mm short axis Incidental CT findings: Cholelithiasis. Trace ascites. Morphologic features of capsular retraction with respect to the dominant liver met. No acute findings relative to pancreas, spleen, adrenal glands and kidneys. Trace ascites in the pelvis. No acute gastrointestinal process. Signs of partial colonic resection in the LEFT hemiabdomen with anastomosis, at the level of the anastomosis there is  a patulous appearance and abundant stool with similar appearance to prior imaging. SKELETON: Signs of skeletal metastases. LEFT hemi sacrum (image 133/4) no discrete corresponding CT abnormality aside from minimal asymmetric sclerosis with a maximum SUV of 9.1 in an area measuring approximately 1.5 cm based on PET activity. LEFT posterior vertebral lesion approximately a cm at the L2 level with a maximum SUV of 4.5 and without discrete  CT correlate aside from minimal sclerotic change in the area with asymmetry. Bilateral L4 lesions with similar FDG uptake and similar sized the L2 lesion, maximum SUV of 6.4 on the RIGHT (image 118/4) there is a focus of lytic change at this level not seen on the LEFT with there is also FDG uptake. Subtle area along the posterior LEFT third rib with minimal increased FDG uptake (image 43/4.) Mild increased FDG uptake along posterior elements, RIGHT T9 transverse process with a maximum SUV of 3.5. Mild uptake at the posterior aspect of the L4 vertebral body with subtle lucency in this area, not definitive. Mild increased metabolic activity at the origin of the LEFT pubic root adjacent to the LEFT hip replacement, acetabular component is of uncertain significance. Incidental CT findings: Spinal degenerative changes. Osteopenia. Post LEFT hip replacement. IMPRESSION: Diffuse hepatic metastatic disease with morphologic features that would favor breast cancer metastasis. 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 Correlation with recent biopsy results may be helpful. Signs of nodal and skeletal metastases. Cholelithiasis. Gallbladder is contracted on today's study with mild thickening and adjacent fluid, nonspecific in the setting of diffuse hepatic metastatic disease. Contracted appearance of the gallbladder would argue against acute biliary process related to the gallbladder. Aortic Atherosclerosis (ICD10-I70.0). Electronically Signed   By: Zetta Bills M.D.   On: 08/14/2020 10:42   US BIOPSY (LIVER)  Result Date: 08/13/2020 INDICATION: 80 year old with remote history of breast cancer. Patient now has innumerable liver lesions and needs a tissue diagnosis. EXAM: ULTRASOUND-GUIDED LIVER LESION BIOPSY MEDICATIONS: Moderate sedation ANESTHESIA/SEDATION: Moderate (conscious) sedation was employed during this procedure. A total of Versed 2.0 mg and Fentanyl 100 mcg was  administered intravenously. Moderate Sedation Time: 28 minutes. The patient's level of consciousness and vital signs were monitored continuously by radiology nursing throughout the procedure under my direct supervision. FLUOROSCOPY TIME:  Fluoroscopy Time: None COMPLICATIONS: None immediate. PROCEDURE: Informed written consent was obtained from the patient after a thorough discussion of the procedural risks, benefits and alternatives. All questions were addressed. Maximal Sterile Barrier Technique was utilized including caps, mask, sterile gowns, sterile gloves, sterile drape, hand hygiene and skin antiseptic. A timeout was performed prior to the initiation of the procedure. Patient was placed supine on the ultrasound table. Liver was identified with ultrasound. The right side of the abdomen was prepped with chlorhexidine and sterile field was created. Skin and soft tissues were anesthetized using 1% lidocaine. A small incision was made. Using ultrasound guidance, a 17 gauge coaxial needle was directed into the right inferior hepatic lobe where there are multiple adjacent lesions. Total of 3 core biopsies were obtained with an 18 gauge core device. Two adequate specimens were obtained. Specimens placed in formalin. Small amount of Gel-Foam slurry was injected through the 17 gauge needle as the needle was removed. Bandage placed over the puncture site. FINDINGS: Liver is diffusely heterogeneous with innumerable lesions. Majority of the lesions are isoechoic. Some of the lesions have evidence of central necrosis. Lesion/lesions in the inferior right hepatic lobe was targeted because it was more conspicuous than  other lesions. Needle position was confirmed within the lesion(s) and core samples were obtained. No immediate bleeding or hematoma formation. IMPRESSION: Ultrasound-guided core biopsy of right inferior hepatic lesion/lesions. Electronically Signed   By: Markus Daft M.D.   On: 08/13/2020 17:46   US BREAST LTD  UNI LEFT INC AXILLA  Result Date: 08/17/2020 CLINICAL DATA:  Patient with cancer of unknown primary involving the liver. History of left breast cancer. EXAM: DIGITAL DIAGNOSTIC BILATERAL MAMMOGRAM WITH TOMOSYNTHESIS AND CAD; ULTRASOUND LEFT BREAST LIMITED TECHNIQUE: Bilateral digital diagnostic mammography and breast tomosynthesis was performed. The images were evaluated with computer-aided detection.; Targeted ultrasound examination of the left breast was performed COMPARISON:  Previous exam(s). ACR Breast Density Category c: The breast tissue is heterogeneously dense, which may obscure small masses. FINDINGS: Stable postlumpectomy changes left breast. No new masses, calcifications or distortion identified. There is dense tissue identified within the upper-outer left breast. Targeted ultrasound is performed, showing dense tissue without suspicious mass within the upper-outer left breast. IMPRESSION: No mammographic evidence for malignancy. Postlumpectomy changes left breast. RECOMMENDATION: Screening mammography 1 year. Given history of new malignancy within the liver, if there is persistent concern for breast primary, recommend further evaluation of the breast bilaterally with MRI. I have discussed the findings and recommendations with the patient. If applicable, a reminder letter will be sent to the patient regarding the next appointment. BI-RADS CATEGORY  2: Benign. Electronically Signed   By: Lovey Newcomer M.D.   On: 08/17/2020 16:33   MM DIAG BREAST TOMO BILATERAL  Result Date: 08/17/2020 CLINICAL DATA:  Patient with cancer of unknown primary involving the liver. History of left breast cancer. EXAM: DIGITAL DIAGNOSTIC BILATERAL MAMMOGRAM WITH TOMOSYNTHESIS AND CAD; ULTRASOUND LEFT BREAST LIMITED TECHNIQUE: Bilateral digital diagnostic mammography and breast tomosynthesis was performed. The images were evaluated with computer-aided detection.; Targeted ultrasound examination of the left breast was performed  COMPARISON:  Previous exam(s). ACR Breast Density Category c: The breast tissue is heterogeneously dense, which may obscure small masses. FINDINGS: Stable postlumpectomy changes left breast. No new masses, calcifications or distortion identified. There is dense tissue identified within the upper-outer left breast. Targeted ultrasound is performed, showing dense tissue without suspicious mass within the upper-outer left breast. IMPRESSION: No mammographic evidence for malignancy. Postlumpectomy changes left breast. RECOMMENDATION: Screening mammography 1 year. Given history of new malignancy within the liver, if there is persistent concern for breast primary, recommend further evaluation of the breast bilaterally with MRI. I have discussed the findings and recommendations with the patient. If applicable, a reminder letter will be sent to the patient regarding the next appointment. BI-RADS CATEGORY  2: Benign. Electronically Signed   By: Lovey Newcomer M.D.   On: 08/17/2020 16:33   US Abdomen Limited RUQ (LIVER/GB)  Result Date: 08/09/2020 CLINICAL DATA:  Cholelithiasis Breast cancer EXAM: ULTRASOUND ABDOMEN LIMITED RIGHT UPPER QUADRANT COMPARISON:  CT chest, abdomen, and pelvis 08/07/2020 FINDINGS: Gallbladder: Sludge and stones noted within the gallbladder lumen. Mild thickening of the gallbladder wall. Sonographic Murphy sign negative per technologist. No pericholecystic fluid. Common bile duct: Diameter: 3 mm Liver: Nodular hepatic contours. Numerous hepatic masses again seen, most likely due to metastatic disease. Portal vein is patent on color Doppler imaging with normal direction of blood flow towards the liver. Other: None. IMPRESSION: 1. Cholelithiasis. Mild gallbladder wall thickening may be due to cholecystitis or reactive to liver disease. 2. Innumerable liver lesions consistent with metastatic disease. Electronically Signed   By: Miachel Roux M.D.   On: 08/09/2020  07:56    ASSESSMENT & PLAN:  80 y.o.  female with  1.  Newly diagnosed metastatic breast cancer with liver mets, bone metastases and nodal metastases 2.  Failure to thrive with very poor p.o. intake 3.  Severe hypokalemia with muscle weakness. 4. acute kidney injury likely related to dehydration diuretics. 5. 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 S/p Lumpectomy, Radiation, 4 cycles of Cytoxan and Taxotere. Completed maintenance Arimidex until Fall 2019 Continues with annual mammograms with OBGYN Dr. Donalynn Furlong.  PLAN: -Discussed pt labwork today, 08/30/2020; blood counts stable, chemistries show less hydration. AST improved but ALT increased. -Advised pt that Verzenio can cause some diarrhea. Recommended OTC Imodium prn.  -Recommended pt decrease Dexamethasone from BID to once daily.- taper set ordered. -Advised pt she has no dietary restrictions. She can eat what she wants and the goal at this point is to increase food and caloric intake. -Recommended pt try to get 48-64 oz of non-carbonated and non-caffeinated liquids daily for hydration. -Will get repeat labs in 2 weeks to ensure toleration of Verzenio. -Continue to avoid liver toxic medications. Avoid NSAIDs. -at patients requested discussed her current clinical condition, concerns and goals of care with her son Shanon Brow. - given liver functions and plts controlled -- asked patient to hold hydrea. On Xarelto $RemoveBe'10mg'NzmxzjdMl$  po daily-- will plan to hold if plt <75k.  FOLLOW UP: Plz schedule for IVF 1 L NS twice weekly x 6 F/u with Murray Hodgkins in 1-2 week to evaluate tolerance of Verzenio RTC with Dr Irene Limbo in about 4 weeks with labs with her dose of faslodex and Xgeva    The total time spent in the appointment was 30 minutes and more than 50% was on counseling and direct patient  cares.   All of the patient's questions were answered with apparent satisfaction. The patient knows to call the clinic with any problems, questions or concerns.   Sullivan Lone MD [place] AAHIVMS [facility] Hematology/Oncology Physician [facility]  (Office):       [phone] (Work cell):  [phone] (Fax):           [phone]  08/30/2020 3:24 PM  I, Reinaldo Raddle, am acting as scribe for Dr. Sullivan Lone, MD..I have reviewed the above documentation for accuracy and completeness, and I agree with the above. Brunetta Genera MD

## 2020-08-30 ENCOUNTER — Inpatient Hospital Stay: Payer: Medicare Other

## 2020-08-30 ENCOUNTER — Inpatient Hospital Stay (HOSPITAL_BASED_OUTPATIENT_CLINIC_OR_DEPARTMENT_OTHER): Payer: Medicare Other | Admitting: Hematology

## 2020-08-30 ENCOUNTER — Other Ambulatory Visit: Payer: Self-pay

## 2020-08-30 ENCOUNTER — Inpatient Hospital Stay: Payer: Medicare Other | Attending: Hematology

## 2020-08-30 VITALS — BP 123/92 | HR 85 | Temp 97.6°F | Resp 20 | Ht 59.0 in | Wt 92.2 lb

## 2020-08-30 DIAGNOSIS — Z170 Estrogen receptor positive status [ER+]: Secondary | ICD-10-CM | POA: Insufficient documentation

## 2020-08-30 DIAGNOSIS — I700 Atherosclerosis of aorta: Secondary | ICD-10-CM | POA: Diagnosis not present

## 2020-08-30 DIAGNOSIS — Z803 Family history of malignant neoplasm of breast: Secondary | ICD-10-CM | POA: Diagnosis not present

## 2020-08-30 DIAGNOSIS — D473 Essential (hemorrhagic) thrombocythemia: Secondary | ICD-10-CM | POA: Diagnosis not present

## 2020-08-30 DIAGNOSIS — Z801 Family history of malignant neoplasm of trachea, bronchus and lung: Secondary | ICD-10-CM | POA: Insufficient documentation

## 2020-08-30 DIAGNOSIS — C50919 Malignant neoplasm of unspecified site of unspecified female breast: Secondary | ICD-10-CM

## 2020-08-30 DIAGNOSIS — C779 Secondary and unspecified malignant neoplasm of lymph node, unspecified: Secondary | ICD-10-CM | POA: Diagnosis not present

## 2020-08-30 DIAGNOSIS — Z87891 Personal history of nicotine dependence: Secondary | ICD-10-CM | POA: Diagnosis not present

## 2020-08-30 DIAGNOSIS — E860 Dehydration: Secondary | ICD-10-CM | POA: Insufficient documentation

## 2020-08-30 DIAGNOSIS — R10819 Abdominal tenderness, unspecified site: Secondary | ICD-10-CM | POA: Insufficient documentation

## 2020-08-30 DIAGNOSIS — Z8052 Family history of malignant neoplasm of bladder: Secondary | ICD-10-CM | POA: Insufficient documentation

## 2020-08-30 DIAGNOSIS — C7951 Secondary malignant neoplasm of bone: Secondary | ICD-10-CM | POA: Diagnosis not present

## 2020-08-30 DIAGNOSIS — Z8249 Family history of ischemic heart disease and other diseases of the circulatory system: Secondary | ICD-10-CM | POA: Diagnosis not present

## 2020-08-30 DIAGNOSIS — C787 Secondary malignant neoplasm of liver and intrahepatic bile duct: Secondary | ICD-10-CM | POA: Insufficient documentation

## 2020-08-30 DIAGNOSIS — Z79899 Other long term (current) drug therapy: Secondary | ICD-10-CM | POA: Insufficient documentation

## 2020-08-30 DIAGNOSIS — N179 Acute kidney failure, unspecified: Secondary | ICD-10-CM | POA: Insufficient documentation

## 2020-08-30 DIAGNOSIS — Z5111 Encounter for antineoplastic chemotherapy: Secondary | ICD-10-CM | POA: Diagnosis not present

## 2020-08-30 DIAGNOSIS — Z808 Family history of malignant neoplasm of other organs or systems: Secondary | ICD-10-CM | POA: Insufficient documentation

## 2020-08-30 DIAGNOSIS — Z842 Family history of other diseases of the genitourinary system: Secondary | ICD-10-CM | POA: Insufficient documentation

## 2020-08-30 LAB — CBC WITH DIFFERENTIAL (CANCER CENTER ONLY)
Abs Immature Granulocytes: 0.14 10*3/uL — ABNORMAL HIGH (ref 0.00–0.07)
Basophils Absolute: 0 10*3/uL (ref 0.0–0.1)
Basophils Relative: 0 %
Eosinophils Absolute: 0 10*3/uL (ref 0.0–0.5)
Eosinophils Relative: 0 %
HCT: 39.8 % (ref 36.0–46.0)
Hemoglobin: 12.5 g/dL (ref 12.0–15.0)
Immature Granulocytes: 1 %
Lymphocytes Relative: 2 %
Lymphs Abs: 0.2 10*3/uL — ABNORMAL LOW (ref 0.7–4.0)
MCH: 27.4 pg (ref 26.0–34.0)
MCHC: 31.4 g/dL (ref 30.0–36.0)
MCV: 87.1 fL (ref 80.0–100.0)
Monocytes Absolute: 0.2 10*3/uL (ref 0.1–1.0)
Monocytes Relative: 2 %
Neutro Abs: 9.2 10*3/uL — ABNORMAL HIGH (ref 1.7–7.7)
Neutrophils Relative %: 95 %
Platelet Count: 134 10*3/uL — ABNORMAL LOW (ref 150–400)
RBC: 4.57 MIL/uL (ref 3.87–5.11)
RDW: 21.5 % — ABNORMAL HIGH (ref 11.5–15.5)
WBC Count: 9.8 10*3/uL (ref 4.0–10.5)
nRBC: 0 % (ref 0.0–0.2)

## 2020-08-30 LAB — CMP (CANCER CENTER ONLY)
ALT: 319 U/L (ref 0–44)
AST: 191 U/L (ref 15–41)
Albumin: 2.8 g/dL — ABNORMAL LOW (ref 3.5–5.0)
Alkaline Phosphatase: 387 U/L — ABNORMAL HIGH (ref 38–126)
Anion gap: 10 (ref 5–15)
BUN: 26 mg/dL — ABNORMAL HIGH (ref 8–23)
CO2: 17 mmol/L — ABNORMAL LOW (ref 22–32)
Calcium: 8 mg/dL — ABNORMAL LOW (ref 8.9–10.3)
Chloride: 107 mmol/L (ref 98–111)
Creatinine: 1.13 mg/dL — ABNORMAL HIGH (ref 0.44–1.00)
GFR, Estimated: 50 mL/min — ABNORMAL LOW (ref >60)
Glucose, Bld: 123 mg/dL — ABNORMAL HIGH (ref 70–99)
Potassium: 4.3 mmol/L (ref 3.5–5.1)
Sodium: 134 mmol/L — ABNORMAL LOW (ref 135–145)
Total Bilirubin: 0.7 mg/dL (ref 0.3–1.2)
Total Protein: 5.8 g/dL — ABNORMAL LOW (ref 6.5–8.1)

## 2020-08-30 MED ORDER — SODIUM CHLORIDE 0.9 % IV SOLN
Freq: Once | INTRAVENOUS | Status: AC
  Filled 2020-08-30: qty 250

## 2020-08-30 NOTE — Progress Notes (Unsigned)
CRITICAL VALUE STICKER  CRITICAL VALUE: AST 191, ALT: 319  DATE & TIME NOTIFIED: 08/30/20 3:25  MD NOTIFIED: Irene Limbo  RESPONSE: Pt seen in infusion. Pt getting IV fluids

## 2020-08-30 NOTE — Patient Instructions (Signed)
Rehydration, Adult Rehydration is the replacement of body fluids, salts, and minerals (electrolytes) that are lost during dehydration. Dehydration is when there is not enough water or other fluids in the body. This happens when you lose more fluids than you take in. Common causes of dehydration include:  Not drinking enough fluids. This can occur when you are ill or doing activities that require a lot of energy, especially in hot weather.  Conditions that cause loss of water or other fluids, such as diarrhea, vomiting, sweating, or urinating a lot.  Other illnesses, such as fever or infection.  Certain medicines, such as those that remove excess fluid from the body (diuretics). Symptoms of mild or moderate dehydration may include thirst, dry lips and mouth, and dizziness. Symptoms of severe dehydration may include increased heart rate, confusion, fainting, and not urinating. For severe dehydration, you may need to get fluids through an IV at the hospital. For mild or moderate dehydration, you can usually rehydrate at home by drinking certain fluids as told by your health care provider. What are the risks? Generally, rehydration is safe. However, taking in too much fluid (overhydration) can be a problem. This is rare. Overhydration can cause an electrolyte imbalance, kidney failure, or a decrease in salt (sodium) levels in the body. Supplies needed You will need an oral rehydration solution (ORS) if your health care provider tells you to use one. This is a drink to treat dehydration. It can be found in pharmacies and retail stores. How to rehydrate Fluids Follow instructions from your health care provider for rehydration. The kind of fluid and the amount you should drink depend on your condition. In general, you should choose drinks that you prefer.  If told by your health care provider, drink an ORS. ? Make an ORS by following instructions on the package. ? Start by drinking small amounts,  about  cup (120 mL) every 5-10 minutes. ? Slowly increase how much you drink until you have taken the amount recommended by your health care provider.  Drink enough clear fluids to keep your urine pale yellow. If you were told to drink an ORS, finish it first, then start slowly drinking other clear fluids. Drink fluids such as: ? Water. This includes sparkling water and flavored water. Drinking only water can lead to having too little sodium in your body (hyponatremia). Follow the advice of your health care provider. ? Water from ice chips you suck on. ? Fruit juice with water you add to it (diluted). ? Sports drinks. ? Hot or cold herbal teas. ? Broth-based soups. ? Milk or milk products. Food Follow instructions from your health care provider about what to eat while you rehydrate. Your health care provider may recommend that you slowly begin eating regular foods in small amounts.  Eat foods that contain a healthy balance of electrolytes, such as bananas, oranges, potatoes, tomatoes, and spinach.  Avoid foods that are greasy or contain a lot of sugar. In some cases, you may get nutrition through a feeding tube that is passed through your nose and into your stomach (nasogastric tube, or NG tube). This may be done if you have uncontrolled vomiting or diarrhea.   Beverages to avoid Certain beverages may make dehydration worse. While you rehydrate, avoid drinking alcohol.   How to tell if you are recovering from dehydration You may be recovering from dehydration if:  You are urinating more often than before you started rehydrating.  Your urine is pale yellow.  Your energy level   improves.  You vomit less frequently.  You have diarrhea less frequently.  Your appetite improves or returns to normal.  You feel less dizzy or less light-headed.  Your skin tone and color start to look more normal. Follow these instructions at home:  Take over-the-counter and prescription medicines only  as told by your health care provider.  Do not take sodium tablets. Doing this can lead to having too much sodium in your body (hypernatremia). Contact a health care provider if:  You continue to have symptoms of mild or moderate dehydration, such as: ? Thirst. ? Dry lips. ? Slightly dry mouth. ? Dizziness. ? Dark urine or less urine than normal. ? Muscle cramps.  You continue to vomit or have diarrhea. Get help right away if you:  Have symptoms of dehydration that get worse.  Have a fever.  Have a severe headache.  Have been vomiting and the following happens: ? Your vomiting gets worse or does not go away. ? Your vomit includes blood or green matter (bile). ? You cannot eat or drink without vomiting.  Have problems with urination or bowel movements, such as: ? Diarrhea that gets worse or does not go away. ? Blood in your stool (feces). This may cause stool to look black and tarry. ? Not urinating, or urinating only a small amount of very dark urine, within 6-8 hours.  Have trouble breathing.  Have symptoms that get worse with treatment. These symptoms may represent a serious problem that is an emergency. Do not wait to see if the symptoms will go away. Get medical help right away. Call your local emergency services (911 in the [place] Do not drive yourself to the hospital. Summary  Rehydration is the replacement of body fluids and minerals (electrolytes) that are lost during dehydration.  Follow instructions from your health care provider for rehydration. The kind of fluid and amount you should drink depend on your condition.  Slowly increase how much you drink until you have taken the amount recommended by your health care provider.  Contact your health care provider if you continue to show signs of mild or moderate dehydration. This information is not intended to replace advice given to you by your health care provider. Make sure you discuss any questions you have with  your health care provider. Document Revised: 05/18/2019 Document Reviewed: 03/28/2019 Elsevier Patient Education  2021 [employer]vier Inc.  

## 2020-08-31 ENCOUNTER — Encounter: Payer: Self-pay | Admitting: Hematology

## 2020-08-31 NOTE — Telephone Encounter (Signed)
Oral Chemotherapy Pharmacist Encounter  I spoke with patient and her husband for overview of: Verzenio for the treatment of metastatic, hormone-receptor positive, HER2 negative breast cancer, in combination with fulvestrant, planned duration until disease progression or unacceptable toxicity.   Counseled patient on administration, dosing, side effects, monitoring, drug-food interactions, safe handling, storage, and disposal.  Patient will take Verzenio $RemoveBefor'100mg'uPMHOuudakxK$  tablets, 1 tablet by mouth twice daily without regard to food.  Patient knows to avoid grapefruit and grapefruit juice.  Verzenio start date: 08/31/20  Adverse effects include but are not limited to: diarrhea, fatigue, nausea, abdominal pain, hair loss, decreased blood counts, and increased liver function tests, and joint pains. Severe, life-threatening, and/or fatal interstitial lung disease (ILD) and/or pneumonitis may occur with CDK 4/6 inhibitors.  Patient has anti-emetic on hand and knows to take it if nausea develops.   Patient will obtain anti diarrheal and alert the office of 4 or more loose stools above baseline.  Reviewed with patient importance of keeping a medication schedule and plan for any missed doses. No barriers to medication adherence identified.  Medication reconciliation performed and medication/allergy list updated.  Insurance authorization for [employer] has been obtained. Patient is approved to receive Verzenio at no cost through [employer].  All questions answered.  Gwendolyn Williams voiced understanding and appreciation.   Medication education handout placed in mail for patient. Patient knows to call the office with questions or concerns. Oral Chemotherapy Clinic phone number provided to patient.   Gwendolyn Williams, PharmD, BCPS Hematology/Oncology Clinical Pharmacist [facility] [phone] 08/31/2020 1:36 PM

## 2020-09-04 DIAGNOSIS — I2510 Atherosclerotic heart disease of native coronary artery without angina pectoris: Secondary | ICD-10-CM | POA: Diagnosis not present

## 2020-09-04 DIAGNOSIS — K219 Gastro-esophageal reflux disease without esophagitis: Secondary | ICD-10-CM | POA: Diagnosis not present

## 2020-09-04 DIAGNOSIS — E7800 Pure hypercholesterolemia, unspecified: Secondary | ICD-10-CM | POA: Diagnosis not present

## 2020-09-04 DIAGNOSIS — N183 Chronic kidney disease, stage 3 unspecified: Secondary | ICD-10-CM | POA: Diagnosis not present

## 2020-09-04 DIAGNOSIS — I129 Hypertensive chronic kidney disease with stage 1 through stage 4 chronic kidney disease, or unspecified chronic kidney disease: Secondary | ICD-10-CM | POA: Diagnosis not present

## 2020-09-04 DIAGNOSIS — E039 Hypothyroidism, unspecified: Secondary | ICD-10-CM | POA: Diagnosis not present

## 2020-09-04 DIAGNOSIS — J471 Bronchiectasis with (acute) exacerbation: Secondary | ICD-10-CM | POA: Diagnosis not present

## 2020-09-04 DIAGNOSIS — J479 Bronchiectasis, uncomplicated: Secondary | ICD-10-CM | POA: Diagnosis not present

## 2020-09-05 ENCOUNTER — Telehealth: Payer: Self-pay

## 2020-09-05 ENCOUNTER — Encounter: Payer: Self-pay | Admitting: Hematology

## 2020-09-05 NOTE — Telephone Encounter (Signed)
Returned call to pt per PT request. Pt is new to taking Verzenio, plts were 134 on  08/30/20. Pt had a nose bleed today. Nose was "seeping blood and it took about 2 hours to totally stop. Pt's husband packed nose and it stopped the "trickle". Pt does have several red places on her legs like possible"bleeding under the tissue" . Pt has an appointment on Friday 09/07/20 for labs and an injection at [facility]. Will send information to Dr Lorenso Courier for him to weigh in on plan if needed.

## 2020-09-06 ENCOUNTER — Encounter: Payer: Self-pay | Admitting: Hematology

## 2020-09-06 ENCOUNTER — Telehealth: Payer: Self-pay | Admitting: Hematology and Oncology

## 2020-09-06 NOTE — Telephone Encounter (Signed)
Scheduled appointment per 06/09 sch msg. Patient is aware. 

## 2020-09-07 ENCOUNTER — Inpatient Hospital Stay (HOSPITAL_BASED_OUTPATIENT_CLINIC_OR_DEPARTMENT_OTHER): Payer: Medicare Other | Admitting: Hematology and Oncology

## 2020-09-07 ENCOUNTER — Inpatient Hospital Stay: Payer: Medicare Other

## 2020-09-07 ENCOUNTER — Other Ambulatory Visit: Payer: Self-pay

## 2020-09-07 VITALS — BP 107/66 | HR 92 | Temp 96.8°F | Resp 19 | Wt 92.6 lb

## 2020-09-07 VITALS — BP 116/71 | HR 90 | Temp 97.7°F | Resp 18

## 2020-09-07 DIAGNOSIS — C787 Secondary malignant neoplasm of liver and intrahepatic bile duct: Secondary | ICD-10-CM | POA: Diagnosis not present

## 2020-09-07 DIAGNOSIS — C779 Secondary and unspecified malignant neoplasm of lymph node, unspecified: Secondary | ICD-10-CM | POA: Diagnosis not present

## 2020-09-07 DIAGNOSIS — Z5111 Encounter for antineoplastic chemotherapy: Secondary | ICD-10-CM | POA: Diagnosis not present

## 2020-09-07 DIAGNOSIS — C50919 Malignant neoplasm of unspecified site of unspecified female breast: Secondary | ICD-10-CM | POA: Diagnosis not present

## 2020-09-07 DIAGNOSIS — Z170 Estrogen receptor positive status [ER+]: Secondary | ICD-10-CM | POA: Diagnosis not present

## 2020-09-07 DIAGNOSIS — D473 Essential (hemorrhagic) thrombocythemia: Secondary | ICD-10-CM

## 2020-09-07 DIAGNOSIS — K55069 Acute infarction of intestine, part and extent unspecified: Secondary | ICD-10-CM

## 2020-09-07 DIAGNOSIS — C7951 Secondary malignant neoplasm of bone: Secondary | ICD-10-CM | POA: Diagnosis not present

## 2020-09-07 LAB — CMP (CANCER CENTER ONLY)
ALT: 169 U/L — ABNORMAL HIGH (ref 0–44)
AST: 130 U/L — ABNORMAL HIGH (ref 15–41)
Albumin: 2.7 g/dL — ABNORMAL LOW (ref 3.5–5.0)
Alkaline Phosphatase: 435 U/L — ABNORMAL HIGH (ref 38–126)
Anion gap: 10 (ref 5–15)
BUN: 33 mg/dL — ABNORMAL HIGH (ref 8–23)
CO2: 18 mmol/L — ABNORMAL LOW (ref 22–32)
Calcium: 8.5 mg/dL — ABNORMAL LOW (ref 8.9–10.3)
Chloride: 107 mmol/L (ref 98–111)
Creatinine: 1.38 mg/dL — ABNORMAL HIGH (ref 0.44–1.00)
GFR, Estimated: 39 mL/min — ABNORMAL LOW (ref >60)
Glucose, Bld: 96 mg/dL (ref 70–99)
Potassium: 4 mmol/L (ref 3.5–5.1)
Sodium: 135 mmol/L (ref 135–145)
Total Bilirubin: 0.8 mg/dL (ref 0.3–1.2)
Total Protein: 6 g/dL — ABNORMAL LOW (ref 6.5–8.1)

## 2020-09-07 LAB — CBC WITH DIFFERENTIAL/PLATELET
Abs Immature Granulocytes: 0.08 10*3/uL — ABNORMAL HIGH (ref 0.00–0.07)
Basophils Absolute: 0 10*3/uL (ref 0.0–0.1)
Basophils Relative: 0 %
Eosinophils Absolute: 0.1 10*3/uL (ref 0.0–0.5)
Eosinophils Relative: 1 %
HCT: 41.4 % (ref 36.0–46.0)
Hemoglobin: 12.7 g/dL (ref 12.0–15.0)
Immature Granulocytes: 1 %
Lymphocytes Relative: 6 %
Lymphs Abs: 0.5 10*3/uL — ABNORMAL LOW (ref 0.7–4.0)
MCH: 27.3 pg (ref 26.0–34.0)
MCHC: 30.7 g/dL (ref 30.0–36.0)
MCV: 89 fL (ref 80.0–100.0)
Monocytes Absolute: 0.2 10*3/uL (ref 0.1–1.0)
Monocytes Relative: 2 %
Neutro Abs: 7.5 10*3/uL (ref 1.7–7.7)
Neutrophils Relative %: 90 %
Platelets: 227 10*3/uL (ref 150–400)
RBC: 4.65 MIL/uL (ref 3.87–5.11)
RDW: 24.2 % — ABNORMAL HIGH (ref 11.5–15.5)
WBC: 8.3 10*3/uL (ref 4.0–10.5)
nRBC: 0 % (ref 0.0–0.2)

## 2020-09-07 MED ORDER — FULVESTRANT 250 MG/5ML IM SOLN
500.0000 mg | Freq: Once | INTRAMUSCULAR | Status: AC
  Administered 2020-09-07: 500 mg via INTRAMUSCULAR

## 2020-09-07 MED ORDER — FULVESTRANT 250 MG/5ML IM SOLN
INTRAMUSCULAR | Status: AC
  Filled 2020-09-07: qty 10

## 2020-09-07 NOTE — Patient Instructions (Signed)
Fulvestrant injection What is this medication? FULVESTRANT (ful VES trant) blocks the effects of estrogen. It is used to treat breast cancer. This medicine may be used for other purposes; ask your health care provider or pharmacist if you have questions. COMMON BRAND NAME(S): FASLODEX What should I tell my care team before I take this medication? They need to know if you have any of these conditions: bleeding disorders liver disease low blood counts, like low white cell, platelet, or red cell counts an unusual or allergic reaction to fulvestrant, other medicines, foods, dyes, or preservatives pregnant or trying to get pregnant breast-feeding How should I use this medication? This medicine is for injection into a muscle. It is usually given by a health care professional in a hospital or clinic setting. Talk to your pediatrician regarding the use of this medicine in children. Special care may be needed. Overdosage: If you think you have taken too much of this medicine contact a poison control center or emergency room at once. NOTE: This medicine is only for you. Do not share this medicine with others. What if I miss a dose? It is important not to miss your dose. Call your doctor or health care professional if you are unable to keep an appointment. What may interact with this medication? medicines that treat or prevent blood clots like warfarin, enoxaparin, dalteparin, apixaban, dabigatran, and rivaroxaban This list may not describe all possible interactions. Give your health care provider a list of all the medicines, herbs, non-prescription drugs, or dietary supplements you use. Also tell them if you smoke, drink alcohol, or use illegal drugs. Some items may interact with your medicine. What should I watch for while using this medication? Your condition will be monitored carefully while you are receiving this medicine. You will need important blood work done while you are taking this  medicine. Do not become pregnant while taking this medicine or for at least 1 year after stopping it. Women of child-bearing potential will need to have a negative pregnancy test before starting this medicine. Women should inform their doctor if they wish to become pregnant or think they might be pregnant. There is a potential for serious side effects to an unborn child. Men should inform their doctors if they wish to father a child. This medicine may lower sperm counts. Talk to your health care professional or pharmacist for more information. Do not breast-feed an infant while taking this medicine or for 1 year after the last dose. What side effects may I notice from receiving this medication? Side effects that you should report to your doctor or health care professional as soon as possible: allergic reactions like skin rash, itching or hives, swelling of the face, lips, or tongue feeling faint or lightheaded, falls pain, tingling, numbness, or weakness in the legs signs and symptoms of infection like fever or chills; cough; flu-like symptoms; sore throat vaginal bleeding Side effects that usually do not require medical attention (report to your doctor or health care professional if they continue or are bothersome): aches, pains constipation diarrhea headache hot flashes nausea, vomiting pain at site where injected stomach pain This list may not describe all possible side effects. Call your doctor for medical advice about side effects. You may report side effects to FDA at 1-800-FDA-1088. Where should I keep my medication? This drug is given in a hospital or clinic and will not be stored at home. NOTE: This sheet is a summary. It may not cover all possible information. If you have   questions about this medicine, talk to your doctor, pharmacist, or health care provider.  2022 Elsevier/Gold Standard (2017-06-25 11:34:41)  

## 2020-09-08 LAB — CANCER ANTIGEN 27.29: CA 27.29: 3597.8 U/mL — ABNORMAL HIGH (ref 0.0–38.6)

## 2020-09-08 LAB — CANCER ANTIGEN 15-3: CA 15-3: 3260 U/mL — ABNORMAL HIGH (ref 0.0–25.0)

## 2020-09-10 ENCOUNTER — Telehealth: Payer: Self-pay | Admitting: Hematology

## 2020-09-10 NOTE — Telephone Encounter (Signed)
Left message with follow-up appointments per 6/8 staff message.

## 2020-09-11 ENCOUNTER — Inpatient Hospital Stay: Payer: Medicare Other

## 2020-09-11 ENCOUNTER — Other Ambulatory Visit: Payer: Self-pay

## 2020-09-11 ENCOUNTER — Encounter: Payer: Self-pay | Admitting: Hematology and Oncology

## 2020-09-11 ENCOUNTER — Encounter: Payer: Self-pay | Admitting: Hematology

## 2020-09-11 ENCOUNTER — Telehealth: Payer: Self-pay | Admitting: Hematology

## 2020-09-11 DIAGNOSIS — C7951 Secondary malignant neoplasm of bone: Secondary | ICD-10-CM | POA: Diagnosis not present

## 2020-09-11 DIAGNOSIS — Z5111 Encounter for antineoplastic chemotherapy: Secondary | ICD-10-CM | POA: Diagnosis not present

## 2020-09-11 DIAGNOSIS — C50919 Malignant neoplasm of unspecified site of unspecified female breast: Secondary | ICD-10-CM | POA: Diagnosis not present

## 2020-09-11 DIAGNOSIS — Z170 Estrogen receptor positive status [ER+]: Secondary | ICD-10-CM | POA: Diagnosis not present

## 2020-09-11 DIAGNOSIS — C779 Secondary and unspecified malignant neoplasm of lymph node, unspecified: Secondary | ICD-10-CM | POA: Diagnosis not present

## 2020-09-11 DIAGNOSIS — C787 Secondary malignant neoplasm of liver and intrahepatic bile duct: Secondary | ICD-10-CM | POA: Diagnosis not present

## 2020-09-11 MED ORDER — SODIUM CHLORIDE 0.9 % IV SOLN
INTRAVENOUS | Status: DC
  Filled 2020-09-11 (×2): qty 250

## 2020-09-11 NOTE — Telephone Encounter (Signed)
Left message with follow-up appointments per 6/8 staff message.

## 2020-09-11 NOTE — Progress Notes (Signed)
HEMATOLOGY/ONCOLOGY CLINIC NOTE  Date of Service: 09/11/2020  Patient Care Team: Gwendolyn Arabian, MD as PCP - General (Family Medicine) Gwendolyn Latch, MD as PCP - Cardiology (Cardiology) Gwendolyn Belt, MD as Consulting Physician (Oncology) Gwendolyn Bookbinder, MD as Consulting Physician (General Surgery)  CHIEF COMPLAINTS/PURPOSE OF CONSULTATION:  Essential Thrombocytosis Newly metastatic breast cancer  HISTORY OF PRESENTING ILLNESS:   Gwendolyn Williams is a wonderful 80 y.o. female who has been referred to [place] by Dr. Gaynelle Williams, and was previously under the care of Dr. Murriel Williams, for evaluation and management of her Essential Thrombocytosis. The pt reports that she is doing well overall.   The pt reports that she has seen Gwendolyn Williams in GI and is concerned for esophageal spasm but does not wish to proceed with the diagnostic work up with esophageal manometry. She notes that muscle relaxants help with this occasional pain. She also follows up with Dr. Donne Williams in surgery to determine if this related to her previous abdominal surgeries. 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  The pt notes that she does not have any other present concerns. She notes that she had SOB at the end of last year, as part of bronchiectasis, and takes Memory Dance once a day, denies needing her rescue inhaler. She denies positional elements to her SOB.  The pt notes that she was off Hyxdroxyurea during the Fall of 2019 during this work up. She then returned to $RemoveBef'500mg'UOlqPLJhWs$  Hydoxyurea once a day and her PLT have normalized. She takes this in the morning with her food and denies any problems tolerating this medication.   The pt denies any concerns for bleeding at this time.  The pt continues with annual mammograms with her OBGYN Dr. Donalynn Williams. The pt pursued maintenance Arimidex for 8.5 years,  and stopped during the evaluation of her respiratory symptoms in Fall 2019. The pt completes a bone density study every year, and notes she has stable osteopenia. She takes Vitamin D replacement and notes her levels are good.  She continues on $RemoveBefo'20mg'imCptZOpRGx$  Xarelto. She continues on $RemoveBefo'81mg'sdbedefVGbd$  aspirin as well.  The pt notes that she has had chronic mouth ulcers for "decades." She has tried Vitamin B complex and mouthwashes. She notes that she routinely gets mouth ulcers after her dental cleanings as well.  Most recent lab results (06/22/18) of CBC w/diff and CMP is as follows: all values are WNL except for RDW at 25.2, Potassium at 3.3, BUN at 30, Creatinine at 1.23, GFR at 43.  On review of systems, pt reports intermittent esophageal discomfort, stable breathing, good energy levels, some abdominal tenderness, chronic mouth ulcers, and denies concerns for bleeding, and any other symptoms.  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 The patient's last visit with [place] was on 08/30/2020.  Her visit today is an urgent unscheduled visit due to nosebleed and bruising. We are joined today by her husband and daughter  On exam today [name] reports that her nosebleeds have stopped.  She notes it was predominantly her left nostril and she had difficulty stopping it.  Her husband who is a pharmacist recommended that they dip tissues and Vaseline and then inserted them in the nostrils.  This resolved the nosebleed.  She did not have any other signs  or symptoms of bleeding such as gum bleeding, hemoptysis, dark stools, or blood in the stool.  She does however have bruising on her arms.  The family was very interested in talking about the plan moving forward and how to document response to her treatments.  We reviewed in detail the treatment strategies for this patient's breast cancer and the plan moving forward.  Lab  results today 09/07/2020 of CBC w/diff and CMP is as follows: all values are WNL except for creatinine 1.38, alkaline phosphatase 435, AST 130, ALT 169, GFR 39.  On review of systems, pt reports no fatigue, weakness, difficulty sleeping, continued abdominal distention and denies fevers, chills, night sweats, worsened appetite, weight loss, abdominal pain, leg swelling, and any other symptoms.  MEDICAL HISTORY:  Past Medical History:  Diagnosis Date   Arthritis    Asthma    controlled with singulair   Breast cancer ([place])    stage I left breast   Current use of long term anticoagulation 04/21/2017   Dyspnea, unspecified 02/24/2017   Started coincident with [place], exertional and orthostatic, normal exam. O2 sat 100%.   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   Pneumonia    viral pneumonia as a kid   PONV (postoperative nausea and vomiting)     SURGICAL HISTORY: Past Surgical History:  Procedure Laterality Date   BREAST SURGERY     Left breast lumpectomy sentinel node biopsy   CERVICAL SPINE SURGERY  05/23/2008   COLECTOMY  04/11/2008   left colectomy with end colostomy due to clot   COLOSTOMY TAKEDOWN  08/14/2008   EYE SURGERY     bil cataracts   HERNIA REPAIR  2011   [facility]   INTRAVASCULAR PRESSURE WIRE/FFR STUDY N/A 01/25/2018   Procedure: INTRAVASCULAR PRESSURE WIRE/FFR STUDY;  Surgeon: Nelva Bush, MD;  Location: [facility];  Service: Cardiovascular;  Laterality: N/A;   LEFT HEART CATH AND CORONARY ANGIOGRAPHY N/A 01/25/2018   Procedure: LEFT HEART CATH AND CORONARY ANGIOGRAPHY;  Surgeon: Nelva Bush, MD;  Location: [facility];  Service:  Cardiovascular;  Laterality: N/A;   LUMBAR DISC SURGERY  2006   TOTAL HIP ARTHROPLASTY Left 10/07/2016   Procedure: LEFT TOTAL HIP ARTHROPLASTY ANTERIOR APPROACH;  Surgeon: Paralee Cancel, MD;  Location: [facility];  Service: Orthopedics;  Laterality: Left;  70 mins    SOCIAL HISTORY: Social History   Socioeconomic History   Marital status: Married    Spouse name: Not on file   Number of children: Not on file   Years of education: Not on file   Highest education level: Not on file  Occupational History   Not on file  Tobacco Use   Smoking status: Former    Packs/day: 1.00    Years: 5.00    Pack years: 5.00    Types: Cigarettes    Start date: 33    Quit date: 1964    Years since quitting: 58.4   Smokeless tobacco: Never  Vaping Use   Vaping Use: Never used  Substance and Sexual Activity   Alcohol use: Yes    Alcohol/week: 0.0 standard drinks  Comment: Rare   Drug use: No   Sexual activity: Not Currently    Partners: Male    Comment: 1st intercourse 80 yo-Fewer than 5 partners  Other Topics Concern   Not on file  Social History Narrative   Not on file   Social Determinants of Health   Financial Resource Strain: Not on file  Food Insecurity: Not on file  Transportation Needs: Not on file  Physical Activity: Not on file  Stress: Not on file  Social Connections: Not on file  Intimate Partner Violence: Not on file    FAMILY HISTORY: Family History  Problem Relation Age of Onset   Breast cancer Mother 6   Lung cancer Mother    Heart disease Father    Basal cell carcinoma Father        x2   CAD Father    Ovarian cysts Sister    Dementia Sister    Breast cancer Maternal Aunt 7   Breast cancer Paternal Aunt 19   Ovarian cysts Maternal Grandmother        possibly had ovarian cancer as well   Breast cancer Maternal Aunt 77   Cancer Other        either pancreatic or colon   Other Daughter 74       breast lumpectomy- complex sclerosing lesion   Polycystic  ovary syndrome Daughter    Bladder Cancer Cousin     ALLERGIES:  is allergic to atorvastatin, ciprofloxacin, lipitor [atorvastatin calcium], penicillins, pravastatin, sulfa antibiotics, and tamiflu [oseltamivir phosphate].  MEDICATIONS:  Current Outpatient Medications  Medication Sig Dispense Refill   buPROPion (WELLBUTRIN XL) 150 MG 24 hr tablet 1 tablet in the morning     promethazine (PHENERGAN) 25 MG tablet 1 tablet     abemaciclib (VERZENIO) 100 MG tablet Take 1 tablet (100 mg total) by mouth 2 (two) times daily. Swallow tablets whole. Do not chew, crush, or split tablets before swallowing. 60 tablet 1   acetaminophen (TYLENOL) 500 MG tablet Take 500 mg by mouth every 6 (six) hours as needed for moderate pain.     albuterol (VENTOLIN HFA) 108 (90 Base) MCG/ACT inhaler 1 puff as needed     amLODipine (NORVASC) 2.5 MG tablet Take 2.5 mg by mouth daily.     amLODipine (NORVASC) 5 MG tablet TAKE ONE TABLET BY MOUTH DAILY FOR BLOOD PRESSURE     Biotin 10 MG TABS Take 1 tablet by mouth daily.     Cholecalciferol (VITAMIN D3) 50 MCG (2000 UT) CAPS 2 capsules     Coenzyme Q10 (CO Q-10) 100 MG CAPS Take 1 capsule by mouth daily.     dexamethasone (DECADRON) 1 MG tablet Take 4 tablets (4 mg total) by mouth daily for 7 days, THEN 3 tablets (3 mg total) daily for 7 days, THEN 2 tablets (2 mg total) daily for 7 days, THEN 1 tablet (1 mg total) daily for 7 days. 70 tablet 0   dexamethasone (DECADRON) 4 MG tablet 1 tablet     docusate sodium (COLACE) 100 MG capsule Take 1 capsule (100 mg total) by mouth 2 (two) times daily. (Patient taking differently: Take 100 mg by mouth daily.) 10 capsule 0   esomeprazole (NEXIUM) 40 MG capsule Take 1 capsule by mouth daily.     fluticasone furoate-vilanterol (BREO ELLIPTA) 200-25 MCG/INH AEPB USE 1 INHALATION DAILY 378 each 3   folic acid (FOLVITE) 1 MG tablet Take 1 mg by mouth daily.     hydroxyurea (HYDREA)  500 MG capsule TAKE ONE CAPSULE BY MOUTH DAILY WITH  FOOD TO MINIMIZE GI SIDE EFFECTS (Patient not taking: Reported on 08/25/2020) 90 capsule 0   hyoscyamine (LEVSIN SL) 0.125 MG SL tablet Take 1 to 3 tablets three times daily as needed (Patient taking differently: Take 0.125 mg by mouth every 6 (six) hours as needed for cramping.) 90 tablet 11   LORazepam (ATIVAN) 0.5 MG tablet Take 1 tablet (0.5 mg total) by mouth every 8 (eight) hours. 30 tablet 0   meclizine (ANTIVERT) 25 MG tablet 1 tablet as needed     ondansetron (ZOFRAN) 4 MG tablet Take 1 tablet (4 mg total) by mouth every 6 (six) hours as needed for nausea. 30 tablet 3   ondansetron (ZOFRAN-ODT) 4 MG disintegrating tablet 1 tablet on the tongue and allow to dissolve     polyethylene glycol (MIRALAX / GLYCOLAX) packet Take 17 g by mouth 2 (two) times daily. (Patient taking differently: Take 17 g by mouth daily.) 14 each 0   potassium chloride (KLOR-CON) 8 MEQ tablet Take 1 tablet (8 mEq total) by mouth 2 (two) times daily. 30 tablet 3   rivaroxaban (XARELTO) 10 MG TABS tablet 1 tablet with food     rosuvastatin (CRESTOR) 40 MG tablet Take 1 tablet (40 mg total) by mouth daily. 90 tablet 3   Spacer/Aero-Holding Chambers DEVI 1 Device by Does not apply route as directed. 1 each 0   triamterene-hydrochlorothiazide (MAXZIDE-25) 37.5-25 MG tablet 1 tablet     No current facility-administered medications for this visit.   Facility-Administered Medications Ordered in Other Visits  Medication Dose Route Frequency Provider Last Rate Last Admin   0.9 %  sodium chloride infusion   Intravenous Continuous Brunetta Genera, MD   Stopped at 09/11/20 1122    REVIEW OF SYSTEMS:   10 Point review of Systems was done is negative except as noted above.  PHYSICAL EXAMINATION: ECOG FS:2 - Symptomatic, <50% confined to bed  Vitals:   09/07/20 1416  BP: 107/66  Pulse: 92  Resp: 19  Temp: (!) 96.8 F (36 C)  SpO2: 100%   Wt Readings from Last 3 Encounters:  09/07/20 92 lb 9.6 oz (42 kg)   08/30/20 92 lb 4 oz (41.8 kg)  08/24/20 90 lb 6.4 oz (41 kg)   Body mass index is 18.7 kg/m.    GENERAL: alert, in no acute distress and comfortable SKIN: no acute rashes, no significant lesions EYES: conjunctiva are pink and non-injected, sclera anicteric LUNGS: clear to auscultation b/l with normal respiratory effort HEART: regular rate & rhythm Extremity: no pedal edema PSYCH: alert & oriented x 3 with fluent speech NEURO: no focal motor/sensory deficits  LABORATORY DATA:  I have reviewed the data as listed  . CBC Latest Ref Rng & Units 09/07/2020 08/30/2020 08/24/2020  WBC 4.0 - 10.5 K/uL 8.3 9.8 12.6(H)  Hemoglobin 12.0 - 15.0 g/dL 12.7 12.5 13.4  Hematocrit 36.0 - 46.0 % 41.4 39.8 43.9  Platelets 150 - 400 K/uL 227 134(L) 178    . CMP Latest Ref Rng & Units 09/07/2020 08/30/2020 08/24/2020  Glucose 70 - 99 mg/dL 96 123(H) 132(H)  BUN 8 - 23 mg/dL 33(H) 26(H) 41(H)  Creatinine 0.44 - 1.00 mg/dL 1.38(H) 1.13(H) 1.46(H)  Sodium 135 - 145 mmol/L 135 134(L) 138  Potassium 3.5 - 5.1 mmol/L 4.0 4.3 3.5  Chloride 98 - 111 mmol/L 107 107 101  CO2 22 - 32 mmol/L 18(L) 17(L) 22  Calcium 8.9 -  10.3 mg/dL 8.5(L) 8.0(L) 9.1  Total Protein 6.5 - 8.1 g/dL 6.0(L) 5.8(L) 6.3(L)  Total Bilirubin 0.3 - 1.2 mg/dL 0.8 0.7 0.8  Alkaline Phos 38 - 126 U/L 435(H) 387(H) 323(H)  AST 15 - 41 U/L 130(H) 191(HH) 220(HH)  ALT 0 - 44 U/L 169(H) 319(HH) 219(H)   06/07/08 JAK2:    RADIOGRAPHIC STUDIES: I have personally reviewed the radiological images as listed and agreed with the findings in the report. NM PET Image Initial (PI) Skull Base To Thigh  Result Date: 08/14/2020 CLINICAL DATA:  Initial treatment strategy for cancer of unknown primary. EXAM: NUCLEAR MEDICINE PET SKULL BASE TO THIGH TECHNIQUE: 5.06 mCi F-18 FDG was injected intravenously. Full-ring PET imaging was performed from the skull base to thigh after the radiotracer. CT data was obtained and used for attenuation correction and  anatomic localization. Fasting blood glucose: 108 mg/dl COMPARISON:  Chest abdomen pelvis CT from Aug 07, 2020. FINDINGS: Mediastinal blood pool activity: SUV max 1.74 Liver activity: SUV max NA NECK: LEFT level IV a lymph node at the thoracic inlet (image 41/4) 7 mm short axis with a maximum SUV of 4.7 no additional signs of adenopathy in the neck. Incidental CT findings: none CHEST: No hypermetabolic mediastinal or hilar nodes. No suspicious pulmonary nodules on the CT scan. Incidental CT findings: Aortic atherosclerosis. Normal heart size. Three-vessel coronary artery disease. No substantial pericardial fluid. Normal caliber central pulmonary vessels. Limited assessment of cardiovascular structures given lack of intravenous contrast. Mild biapical pleural and parenchymal scarring. Mild basilar atelectasis. No suspicious nodule, consolidation or substantial pleural effusion. Airways are patent. ABDOMEN/PELVIS: Diffuse hepatic metastatic disease, largest area in the dome of the RIGHT hemi liver measuring in total near 10 x 6 cm on image 90 of series 4 and as much is 11 x 6 cm on image 94 of series 4. All areas, subsegments of the liver are involved. Maximum SUV obtained in the liver in the dominant lesion is 8.7 Celiac adenopathy better seen on the previous CT (image 100/4) short axis measurement 11 mm with a maximum SUV of 6.2 Mild increased FDG uptake in the intra-aortocaval groove and along the LEFT para aortic chain (image 105/4) 9 mm lymph node with a maximum SUV of 6.6. No additional solid organ or nodal uptake in the abdomen. RIGHT groin lymph node with a maximum SUV of 5.4 on image 151 of series 4 measuring approximately 6 mm short axis Incidental CT findings: Cholelithiasis. Trace ascites. Morphologic features of capsular retraction with respect to the dominant liver met. No acute findings relative to pancreas, spleen, adrenal glands and kidneys. Trace ascites in the pelvis. No acute gastrointestinal  process. Signs of partial colonic resection in the LEFT hemiabdomen with anastomosis, at the level of the anastomosis there is a patulous appearance and abundant stool with similar appearance to prior imaging. SKELETON: Signs of skeletal metastases. LEFT hemi sacrum (image 133/4) no discrete corresponding CT abnormality aside from minimal asymmetric sclerosis with a maximum SUV of 9.1 in an area measuring approximately 1.5 cm based on PET activity. LEFT posterior vertebral lesion approximately a cm at the L2 level with a maximum SUV of 4.5 and without discrete CT correlate aside from minimal sclerotic change in the area with asymmetry. Bilateral L4 lesions with similar FDG uptake and similar sized the L2 lesion, maximum SUV of 6.4 on the RIGHT (image 118/4) there is a focus of lytic change at this level not seen on the LEFT with there is also FDG uptake.  Subtle area along the posterior LEFT third rib with minimal increased FDG uptake (image 43/4.) Mild increased FDG uptake along posterior elements, RIGHT T9 transverse process with a maximum SUV of 3.5. Mild uptake at the posterior aspect of the L4 vertebral body with subtle lucency in this area, not definitive. Mild increased metabolic activity at the origin of the LEFT pubic root adjacent to the LEFT hip replacement, acetabular component is of uncertain significance. Incidental CT findings: Spinal degenerative changes. Osteopenia. Post LEFT hip replacement. IMPRESSION: Diffuse hepatic metastatic disease with morphologic features that would favor breast cancer metastasis. 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 Correlation with recent biopsy results may be helpful. Signs of nodal and skeletal metastases. Cholelithiasis. Gallbladder is contracted on today's study with mild thickening and adjacent fluid, nonspecific in the setting of diffuse hepatic metastatic disease. Contracted appearance of the gallbladder would argue  against acute biliary process related to the gallbladder. Aortic Atherosclerosis (ICD10-I70.0). Electronically Signed   By: Zetta Bills M.D.   On: 08/14/2020 10:42   US BIOPSY (LIVER)  Result Date: 08/13/2020 INDICATION: 80 year old with remote history of breast cancer. Patient now has innumerable liver lesions and needs a tissue diagnosis. EXAM: ULTRASOUND-GUIDED LIVER LESION BIOPSY MEDICATIONS: Moderate sedation ANESTHESIA/SEDATION: Moderate (conscious) sedation was employed during this procedure. A total of Versed 2.0 mg and Fentanyl 100 mcg was administered intravenously. Moderate Sedation Time: 28 minutes. The patient's level of consciousness and vital signs were monitored continuously by radiology nursing throughout the procedure under my direct supervision. FLUOROSCOPY TIME:  Fluoroscopy Time: None COMPLICATIONS: None immediate. PROCEDURE: Informed written consent was obtained from the patient after a thorough discussion of the procedural risks, benefits and alternatives. All questions were addressed. Maximal Sterile Barrier Technique was utilized including caps, mask, sterile gowns, sterile gloves, sterile drape, hand hygiene and skin antiseptic. A timeout was performed prior to the initiation of the procedure. Patient was placed supine on the ultrasound table. Liver was identified with ultrasound. The right side of the abdomen was prepped with chlorhexidine and sterile field was created. Skin and soft tissues were anesthetized using 1% lidocaine. A small incision was made. Using ultrasound guidance, a 17 gauge coaxial needle was directed into the right inferior hepatic lobe where there are multiple adjacent lesions. Total of 3 core biopsies were obtained with an 18 gauge core device. Two adequate specimens were obtained. Specimens placed in formalin. Small amount of Gel-Foam slurry was injected through the 17 gauge needle as the needle was removed. Bandage placed over the puncture site. FINDINGS:  Liver is diffusely heterogeneous with innumerable lesions. Majority of the lesions are isoechoic. Some of the lesions have evidence of central necrosis. Lesion/lesions in the inferior right hepatic lobe was targeted because it was more conspicuous than other lesions. Needle position was confirmed within the lesion(s) and core samples were obtained. No immediate bleeding or hematoma formation. IMPRESSION: Ultrasound-guided core biopsy of right inferior hepatic lesion/lesions. Electronically Signed   By: Markus Daft M.D.   On: 08/13/2020 17:46   US BREAST LTD UNI LEFT INC AXILLA  Result Date: 08/17/2020 CLINICAL DATA:  Patient with cancer of unknown primary involving the liver. History of left breast cancer. EXAM: DIGITAL DIAGNOSTIC BILATERAL MAMMOGRAM WITH TOMOSYNTHESIS AND CAD; ULTRASOUND LEFT BREAST LIMITED TECHNIQUE: Bilateral digital diagnostic mammography and breast tomosynthesis was performed. The images were evaluated with computer-aided detection.; Targeted ultrasound examination of the left breast was performed COMPARISON:  Previous exam(s). ACR Breast Density Category c: The breast tissue  is heterogeneously dense, which may obscure small masses. FINDINGS: Stable postlumpectomy changes left breast. No new masses, calcifications or distortion identified. There is dense tissue identified within the upper-outer left breast. Targeted ultrasound is performed, showing dense tissue without suspicious mass within the upper-outer left breast. IMPRESSION: No mammographic evidence for malignancy. Postlumpectomy changes left breast. RECOMMENDATION: Screening mammography 1 year. Given history of new malignancy within the liver, if there is persistent concern for breast primary, recommend further evaluation of the breast bilaterally with MRI. I have discussed the findings and recommendations with the patient. If applicable, a reminder letter will be sent to the patient regarding the next appointment. BI-RADS CATEGORY   2: Benign. Electronically Signed   By: Lovey Newcomer M.D.   On: 08/17/2020 16:33   MM DIAG BREAST TOMO BILATERAL  Result Date: 08/17/2020 CLINICAL DATA:  Patient with cancer of unknown primary involving the liver. History of left breast cancer. EXAM: DIGITAL DIAGNOSTIC BILATERAL MAMMOGRAM WITH TOMOSYNTHESIS AND CAD; ULTRASOUND LEFT BREAST LIMITED TECHNIQUE: Bilateral digital diagnostic mammography and breast tomosynthesis was performed. The images were evaluated with computer-aided detection.; Targeted ultrasound examination of the left breast was performed COMPARISON:  Previous exam(s). ACR Breast Density Category c: The breast tissue is heterogeneously dense, which may obscure small masses. FINDINGS: Stable postlumpectomy changes left breast. No new masses, calcifications or distortion identified. There is dense tissue identified within the upper-outer left breast. Targeted ultrasound is performed, showing dense tissue without suspicious mass within the upper-outer left breast. IMPRESSION: No mammographic evidence for malignancy. Postlumpectomy changes left breast. RECOMMENDATION: Screening mammography 1 year. Given history of new malignancy within the liver, if there is persistent concern for breast primary, recommend further evaluation of the breast bilaterally with MRI. I have discussed the findings and recommendations with the patient. If applicable, a reminder letter will be sent to the patient regarding the next appointment. BI-RADS CATEGORY  2: Benign. Electronically Signed   By: Lovey Newcomer M.D.   On: 08/17/2020 16:33     ASSESSMENT & PLAN:  80 y.o. female with  1.  Newly diagnosed metastatic breast cancer with liver mets, bone metastases and nodal metastases 2.  Failure to thrive with very poor p.o. intake 3.  Severe hypokalemia with muscle weakness. 4. acute kidney injury likely related to dehydration diuretics. 5. 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 S/p Lumpectomy, Radiation, 4 cycles of Cytoxan and Taxotere. Completed maintenance Arimidex until Fall 2019 Continues with annual mammograms with OBGYN Dr. Donalynn Williams.  PLAN: --nosebleed resolved, counts are stable. No further intervention required. Can continue Xarelto as prescribed.  -Discussed pt labwork today, 09/07/2020; blood counts improved, chemistries show AST and ALT improved -Advised pt that Verzenio can cause some diarrhea. Recommended OTC Imodium prn.  -Recommended pt decrease Dexamethasone from BID to once daily.- taper set ordered. -Advised pt she has no dietary restrictions. She can eat what she wants and the goal at this point is to increase food and caloric intake. -Recommended pt try to get 48-64 oz of non-carbonated and non-caffeinated liquids daily for hydration. -Continue to avoid liver toxic medications. Avoid NSAIDs. -at patients requested discussed her current clinical condition, concerns and goals of care with her son Shanon Brow. - given liver functions and plts controlled -- asked patient to hold hydrea. On Xarelto $RemoveBe'10mg'agAuKNKOj$  po daily-- will plan to hold  if plt <75k.  FOLLOW UP: Proceed with fluids on 6/16 and 6/18 F/u with [facility] Hodgkins on 09/21/2020 to evaluate tolerance of Verzenio RTC with Dr Irene Limbo in July with labs with her dose of faslodex and Xgeva  The total time spent in the appointment was 30 minutes and more than 50% was on counseling and direct patient cares.   All of the patient's questions were answered with apparent satisfaction. The patient knows to call the clinic with any problems, questions or concerns.   Ledell Peoples, MD Department of Hematology/Oncology [place] at [facility] Phone: [phone] Pager: [phone] Email: [email or web address]

## 2020-09-11 NOTE — Patient Instructions (Signed)

## 2020-09-12 ENCOUNTER — Telehealth: Payer: Self-pay

## 2020-09-12 NOTE — Telephone Encounter (Signed)
Returned call to pt's husband per his request. Pt's husband reported pt is having increased edema and weakness in her lower legs . Pt unable to stand and walk any distance. Pt does not want to come in for IVF tomorrow/ok to skip per Dr Lorenso Courier. Pt's husband to call in am to update on pt's condition . At that time we will determine next steps.

## 2020-09-13 ENCOUNTER — Ambulatory Visit: Payer: Medicare Other

## 2020-09-17 ENCOUNTER — Other Ambulatory Visit: Payer: Self-pay

## 2020-09-17 ENCOUNTER — Encounter (HOSPITAL_COMMUNITY): Payer: Self-pay | Admitting: Emergency Medicine

## 2020-09-17 ENCOUNTER — Emergency Department (HOSPITAL_COMMUNITY): Payer: Medicare Other

## 2020-09-17 ENCOUNTER — Emergency Department (HOSPITAL_COMMUNITY)
Admission: EM | Admit: 2020-09-17 | Discharge: 2020-09-18 | Disposition: A | Discharge status: Home / Self Care | Payer: Medicare Other | Source: Home / Self Care | Attending: Emergency Medicine | Admitting: Emergency Medicine

## 2020-09-17 DIAGNOSIS — R5383 Other fatigue: Secondary | ICD-10-CM | POA: Insufficient documentation

## 2020-09-17 DIAGNOSIS — K6289 Other specified diseases of anus and rectum: Secondary | ICD-10-CM | POA: Diagnosis not present

## 2020-09-17 DIAGNOSIS — I10 Essential (primary) hypertension: Secondary | ICD-10-CM | POA: Insufficient documentation

## 2020-09-17 DIAGNOSIS — C50919 Malignant neoplasm of unspecified site of unspecified female breast: Secondary | ICD-10-CM | POA: Diagnosis not present

## 2020-09-17 DIAGNOSIS — K5900 Constipation, unspecified: Secondary | ICD-10-CM

## 2020-09-17 DIAGNOSIS — D65 Disseminated intravascular coagulation [defibrination syndrome]: Secondary | ICD-10-CM | POA: Diagnosis not present

## 2020-09-17 DIAGNOSIS — J45909 Unspecified asthma, uncomplicated: Secondary | ICD-10-CM | POA: Insufficient documentation

## 2020-09-17 DIAGNOSIS — R630 Anorexia: Secondary | ICD-10-CM | POA: Insufficient documentation

## 2020-09-17 DIAGNOSIS — K8020 Calculus of gallbladder without cholecystitis without obstruction: Secondary | ICD-10-CM | POA: Diagnosis not present

## 2020-09-17 DIAGNOSIS — R0602 Shortness of breath: Secondary | ICD-10-CM | POA: Diagnosis not present

## 2020-09-17 DIAGNOSIS — Z7901 Long term (current) use of anticoagulants: Secondary | ICD-10-CM | POA: Insufficient documentation

## 2020-09-17 DIAGNOSIS — Z8505 Personal history of malignant neoplasm of liver: Secondary | ICD-10-CM | POA: Insufficient documentation

## 2020-09-17 DIAGNOSIS — J9601 Acute respiratory failure with hypoxia: Secondary | ICD-10-CM | POA: Diagnosis not present

## 2020-09-17 DIAGNOSIS — K3189 Other diseases of stomach and duodenum: Secondary | ICD-10-CM | POA: Diagnosis not present

## 2020-09-17 DIAGNOSIS — Z853 Personal history of malignant neoplasm of breast: Secondary | ICD-10-CM | POA: Insufficient documentation

## 2020-09-17 DIAGNOSIS — Z79899 Other long term (current) drug therapy: Secondary | ICD-10-CM | POA: Insufficient documentation

## 2020-09-17 DIAGNOSIS — J189 Pneumonia, unspecified organism: Secondary | ICD-10-CM | POA: Diagnosis not present

## 2020-09-17 DIAGNOSIS — Z515 Encounter for palliative care: Secondary | ICD-10-CM | POA: Diagnosis not present

## 2020-09-17 DIAGNOSIS — Z87891 Personal history of nicotine dependence: Secondary | ICD-10-CM | POA: Insufficient documentation

## 2020-09-17 DIAGNOSIS — R000 Tachycardia, unspecified: Secondary | ICD-10-CM | POA: Diagnosis not present

## 2020-09-17 DIAGNOSIS — Z96642 Presence of left artificial hip joint: Secondary | ICD-10-CM | POA: Insufficient documentation

## 2020-09-17 DIAGNOSIS — Z20822 Contact with and (suspected) exposure to covid-19: Secondary | ICD-10-CM | POA: Diagnosis not present

## 2020-09-17 DIAGNOSIS — Z66 Do not resuscitate: Secondary | ICD-10-CM | POA: Diagnosis not present

## 2020-09-17 LAB — CBC WITH DIFFERENTIAL/PLATELET
Abs Immature Granulocytes: 0.07 10*3/uL (ref 0.00–0.07)
Basophils Absolute: 0 10*3/uL (ref 0.0–0.1)
Basophils Relative: 1 %
Eosinophils Absolute: 0 10*3/uL (ref 0.0–0.5)
Eosinophils Relative: 0 %
HCT: 35.8 % — ABNORMAL LOW (ref 36.0–46.0)
Hemoglobin: 11 g/dL — ABNORMAL LOW (ref 12.0–15.0)
Immature Granulocytes: 2 %
Lymphocytes Relative: 5 %
Lymphs Abs: 0.2 10*3/uL — ABNORMAL LOW (ref 0.7–4.0)
MCH: 28.6 pg (ref 26.0–34.0)
MCHC: 30.7 g/dL (ref 30.0–36.0)
MCV: 93.2 fL (ref 80.0–100.0)
Monocytes Absolute: 0 10*3/uL — ABNORMAL LOW (ref 0.1–1.0)
Monocytes Relative: 1 %
Neutro Abs: 3.5 10*3/uL (ref 1.7–7.7)
Neutrophils Relative %: 91 %
Platelets: 126 10*3/uL — ABNORMAL LOW (ref 150–400)
RBC: 3.84 MIL/uL — ABNORMAL LOW (ref 3.87–5.11)
RDW: 23 % — ABNORMAL HIGH (ref 11.5–15.5)
WBC: 3.8 10*3/uL — ABNORMAL LOW (ref 4.0–10.5)
nRBC: 0 % (ref 0.0–0.2)

## 2020-09-17 LAB — COMPREHENSIVE METABOLIC PANEL
ALT: 93 U/L — ABNORMAL HIGH (ref 0–44)
AST: 92 U/L — ABNORMAL HIGH (ref 15–41)
Albumin: 2.3 g/dL — ABNORMAL LOW (ref 3.5–5.0)
Alkaline Phosphatase: 222 U/L — ABNORMAL HIGH (ref 38–126)
Anion gap: 7 (ref 5–15)
BUN: 40 mg/dL — ABNORMAL HIGH (ref 8–23)
CO2: 20 mmol/L — ABNORMAL LOW (ref 22–32)
Calcium: 8.3 mg/dL — ABNORMAL LOW (ref 8.9–10.3)
Chloride: 108 mmol/L (ref 98–111)
Creatinine, Ser: 1.07 mg/dL — ABNORMAL HIGH (ref 0.44–1.00)
GFR, Estimated: 53 mL/min — ABNORMAL LOW (ref >60)
Glucose, Bld: 117 mg/dL — ABNORMAL HIGH (ref 70–99)
Potassium: 3.7 mmol/L (ref 3.5–5.1)
Sodium: 135 mmol/L (ref 135–145)
Total Bilirubin: 0.6 mg/dL (ref 0.3–1.2)
Total Protein: 4.8 g/dL — ABNORMAL LOW (ref 6.5–8.1)

## 2020-09-17 LAB — LIPASE, BLOOD: Lipase: 31 U/L (ref 11–51)

## 2020-09-17 MED ORDER — FLEET ENEMA 7-19 GM/118ML RE ENEM
1.0000 | ENEMA | Freq: Once | RECTAL | Status: AC
  Administered 2020-09-17: 1 via RECTAL
  Filled 2020-09-17: qty 1

## 2020-09-17 MED ORDER — SODIUM CHLORIDE 0.9 % IV SOLN: INTRAVENOUS | Status: DC

## 2020-09-17 MED ORDER — SODIUM CHLORIDE 0.9 % IV BOLUS
1000.0000 mL | Freq: Once | INTRAVENOUS | Status: AC
  Administered 2020-09-17: 1000 mL via INTRAVENOUS

## 2020-09-17 NOTE — ED Provider Notes (Signed)
[facility] DEPT Provider Note   CSN: [id number] Arrival date & time: 09/17/20  1926     History Chief Complaint  Patient presents with   Constipation    Gwendolyn Williams is a 80 y.o. female.  80 year old female presents with abdominal discomfort with associated constipation.  States that she has had very small BMs for the past 2 weeks.  Endorses that anorexia as well as increased fatigue.  History of metastatic liver cancer.  Receives IV fluids occasionally at the infusion center.  Denies any fever or vomiting.  Has used over-the-counter medications without relief.      Past Medical History:  Diagnosis Date   Arthritis    Asthma    controlled with singulair   Breast cancer ([place])    stage I left breast   Current use of long term anticoagulation 04/21/2017   Dyspnea, unspecified 02/24/2017   Started coincident with Hydrea, exertional and orthostatic, normal exam. O2 sat 100%.   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   Pneumonia    viral pneumonia as a kid   PONV (postoperative nausea and vomiting)     Patient Active Problem List   Diagnosis Date Noted   Metastatic breast cancer ([place]) 08/20/2020   Bone metastases ([place]) 08/20/2020   Metastatic carcinoma involving liver with unknown primary site [facility])    Protein-calorie malnutrition, severe 08/09/2020   Hypokalemia 08/08/2020   Essential hypertension 04/19/2019   Bronchiectasis without complication ([place]) 12/87/8676   Mild reactive airways disease 02/17/2018   Abnormal cardiac CT angiography    Genetic testing 06/01/2017   Current use of long term anticoagulation 04/21/2017    Family history of breast cancer    Dyspnea on minimal exertion 02/24/2017   S/P left THA, 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   Hematoma of arm 08/09/2012   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   Myeloproliferative disorder ([place]) 01/06/2011   History of breast cancer in female 01/06/2011    Past Surgical History:  Procedure Laterality Date   BREAST SURGERY     Left breast lumpectomy sentinel node biopsy   CERVICAL SPINE SURGERY  05/23/2008   COLECTOMY  04/11/2008   left colectomy with end colostomy due to clot   COLOSTOMY TAKEDOWN  08/14/2008   EYE SURGERY     bil cataracts   HERNIA REPAIR  2011   LVH   INTRAVASCULAR PRESSURE WIRE/FFR STUDY N/A 01/25/2018   Procedure: INTRAVASCULAR PRESSURE WIRE/FFR STUDY;  Surgeon: Nelva Bush, MD;  Location: [place] [facility] CV LAB;  Service: Cardiovascular;  Laterality: N/A;   LEFT HEART CATH AND CORONARY ANGIOGRAPHY N/A 01/25/2018   Procedure: LEFT HEART CATH AND CORONARY ANGIOGRAPHY;  Surgeon: Nelva Bush, MD;  Location: [place] CV LAB;  Service: Cardiovascular;  Laterality: N/A;   LUMBAR DISC SURGERY  2006   TOTAL HIP ARTHROPLASTY Left 10/07/2016   Procedure: LEFT TOTAL HIP ARTHROPLASTY ANTERIOR APPROACH;  Surgeon: Paralee Cancel, MD;  Location: [facility];  Service: Orthopedics;  Laterality: Left;  70 mins     OB History     Gravida  2   Para  Term      Preterm      AB  0   Living  2      SAB      IAB      Ectopic  0   Multiple      Live Births              Family History  Problem Relation Age of Onset   Breast cancer Mother 16   Lung cancer Mother    Heart disease Father    Basal cell carcinoma Father        x2   CAD Father    Ovarian cysts Sister    Dementia Sister    Breast cancer Maternal Aunt 29   Breast cancer Paternal Aunt 56   Ovarian cysts Maternal Grandmother        possibly had ovarian cancer as well   Breast cancer Maternal Aunt 55   Cancer Other         either pancreatic or colon   Other Daughter 60       breast lumpectomy- complex sclerosing lesion   Polycystic ovary syndrome Daughter    Bladder Cancer Cousin     Social History   Tobacco Use   Smoking status: Former    Packs/day: 1.00    Years: 5.00    Pack years: 5.00    Types: Cigarettes    Start date: 28    Quit date: 1964    Years since quitting: 58.5   Smokeless tobacco: Never  Vaping Use   Vaping Use: Never used  Substance Use Topics   Alcohol use: Yes    Alcohol/week: 0.0 standard drinks    Comment: Rare   Drug use: No    Home Medications Prior to Admission medications   Medication Sig Start Date End Date Taking? Authorizing Provider  abemaciclib (VERZENIO) 100 MG tablet Take 1 tablet (100 mg total) by mouth 2 (two) times daily. Swallow tablets whole. Do not chew, crush, or split tablets before swallowing. 08/20/20   Brunetta Genera, MD  acetaminophen (TYLENOL) 500 MG tablet Take 500 mg by mouth every 6 (six) hours as needed for moderate pain.    [provider]  albuterol (VENTOLIN HFA) 108 (90 Base) MCG/ACT inhaler 1 puff as needed    [provider]  amLODipine (NORVASC) 2.5 MG tablet Take 2.5 mg by mouth daily.    [provider]  amLODipine (NORVASC) 5 MG tablet TAKE ONE TABLET BY MOUTH DAILY FOR BLOOD PRESSURE    [provider]  Biotin 10 MG TABS Take 1 tablet by mouth daily.    [provider]  buPROPion (WELLBUTRIN XL) 150 MG 24 hr tablet 1 tablet in the morning 08/02/20   [provider]  Cholecalciferol (VITAMIN D3) 50 MCG (2000 UT) CAPS 2 capsules    [provider]  Coenzyme Q10 (CO Q-10) 100 MG CAPS Take 1 capsule by mouth daily.    [provider]  dexamethasone (DECADRON) 1 MG tablet Take 4 tablets (4 mg total) by mouth daily for 7 days, THEN 3 tablets (3 mg total) daily for 7 days, THEN 2 tablets (2 mg total) daily for 7 days, THEN 1 tablet (1 mg total) daily for 7 days.  08/28/20 09/25/20  Brunetta Genera, MD  dexamethasone (DECADRON) 4 MG tablet 1 tablet 08/10/20   [provider]  docusate sodium (COLACE) 100 MG capsule Take 1 capsule (100 mg total) by mouth 2 (two)  times daily. Patient taking differently: Take 100 mg by mouth daily. 10/07/16   Danae Orleans, PA-C  esomeprazole (NEXIUM) 40 MG capsule Take 1 capsule by mouth daily.    [provider]  fluticasone furoate-vilanterol (BREO ELLIPTA) 200-25 MCG/INH AEPB USE 1 INHALATION DAILY 01/23/20   Margaretha Seeds, MD  folic acid (FOLVITE) 1 MG tablet Take 1 mg by mouth daily.    [provider]  hydroxyurea (HYDREA) 500 MG capsule TAKE ONE CAPSULE BY MOUTH DAILY WITH FOOD TO MINIMIZE GI SIDE EFFECTS Patient not taking: Reported on 08/25/2020 07/09/20   Brunetta Genera, MD  hyoscyamine (LEVSIN SL) 0.125 MG SL tablet Take 1 to 3 tablets three times daily as needed Patient taking differently: Take 0.125 mg by mouth every 6 (six) hours as needed for cramping. 05/07/20   Milus Banister, MD  LORazepam (ATIVAN) 0.5 MG tablet Take 1 tablet (0.5 mg total) by mouth every 8 (eight) hours. 08/28/20   Brunetta Genera, MD  meclizine (ANTIVERT) 25 MG tablet 1 tablet as needed    [provider]  ondansetron (ZOFRAN) 4 MG tablet Take 1 tablet (4 mg total) by mouth every 6 (six) hours as needed for nausea. 08/28/20   Brunetta Genera, MD  ondansetron (ZOFRAN-ODT) 4 MG disintegrating tablet 1 tablet on the tongue and allow to dissolve    [provider]  polyethylene glycol (MIRALAX / GLYCOLAX) packet Take 17 g by mouth 2 (two) times daily. Patient taking differently: Take 17 g by mouth daily. 10/07/16   Danae Orleans, PA-C  potassium chloride (KLOR-CON) 8 MEQ tablet Take 1 tablet (8 mEq total) by mouth 2 (two) times daily. 08/28/20   Brunetta Genera, MD  promethazine (PHENERGAN) 25 MG tablet 1 tablet 08/06/20   [provider]  rivaroxaban (XARELTO) 10 MG  TABS tablet 1 tablet with food    [provider]  rosuvastatin (CRESTOR) 40 MG tablet Take 1 tablet (40 mg total) by mouth daily. 11/25/19   Skeet Latch, MD  Spacer/Aero-Holding Josiah Lobo DEVI 1 Device by Does not apply route as directed. 04/15/18   Margaretha Seeds, MD  triamterene-hydrochlorothiazide (MAXZIDE-25) 37.5-25 MG tablet 1 tablet    [provider]    Allergies    Atorvastatin, Ciprofloxacin, Lipitor [atorvastatin calcium], Penicillins, Pravastatin, Sulfa antibiotics, and Tamiflu [oseltamivir phosphate]  Review of Systems   Review of Systems  All other systems reviewed and are negative.  Physical Exam Updated Vital Signs BP 113/80 (BP Location: Right Arm)   Pulse (!) 118   Temp 98 F (36.7 C) (Oral)   Resp 16   Ht 1.499 m (4\' 11" )   Wt 40.8 kg   SpO2 97%   BMI 18.18 kg/m   Physical Exam Vitals and nursing note reviewed.  Constitutional:      General: She is not in acute distress.    Appearance: Normal appearance. She is well-developed. She is not toxic-appearing.  HENT:     Head: Normocephalic and atraumatic.  Eyes:     General: Lids are normal.     Conjunctiva/sclera: Conjunctivae normal.     Pupils: Pupils are equal, round, and reactive to light.  Neck:     Thyroid: No thyroid mass.     Trachea: No tracheal deviation.  Cardiovascular:     Rate and Rhythm: Normal rate and regular rhythm.     Heart sounds: Normal heart sounds. No murmur heard.   No gallop.  Pulmonary:     Effort:  Pulmonary effort is normal. No respiratory distress.     Breath sounds: Normal breath sounds. No stridor. No decreased breath sounds, wheezing, rhonchi or rales.  Abdominal:     General: There is distension.     Palpations: Abdomen is soft.     Tenderness: There is no abdominal tenderness. There is no rebound.  Genitourinary:    Comments: Copious stool noted in rectal vault Musculoskeletal:        General: No tenderness. Normal range of motion.      Cervical back: Normal range of motion and neck supple.  Skin:    General: Skin is warm and dry.     Findings: No abrasion or rash.  Neurological:     Mental Status: She is alert and oriented to person, place, and time. Mental status is at baseline.     GCS: GCS eye subscore is 4. GCS verbal subscore is 5. GCS motor subscore is 6.     Cranial Nerves: Cranial nerves are intact. No cranial nerve deficit.     Sensory: No sensory deficit.     Motor: Motor function is intact.  Psychiatric:        Attention and Perception: Attention normal.        Speech: Speech normal.        Behavior: Behavior normal.    ED Results / Procedures / Treatments   Labs (all labs ordered are listed, but only abnormal results are displayed) Labs Reviewed  CBC WITH DIFFERENTIAL/PLATELET - Abnormal; Notable for the following components:      Result Value   WBC 3.8 (*)    RBC 3.84 (*)    Hemoglobin 11.0 (*)    HCT 35.8 (*)    RDW 23.0 (*)    Platelets 126 (*)    All other components within normal limits  COMPREHENSIVE METABOLIC PANEL  URINALYSIS, ROUTINE W REFLEX MICROSCOPIC  LIPASE, BLOOD    EKG None  Radiology No results found.  Procedures Procedures   Medications Ordered in ED Medications  sodium phosphate (FLEET) 7-19 GM/118ML enema 1 enema (has no administration in time range)  0.9 %  sodium chloride infusion (has no administration in time range)  sodium chloride 0.9 % bolus 1,000 mL (has no administration in time range)    ED Course  I have reviewed the triage vital signs and the nursing notes.  Pertinent labs & imaging results that were available during my care of the patient were reviewed by me and considered in my medical decision making (see chart for details).    MDM Rules/Calculators/A&P                          Patient manually disimpacted here with good result.  Laboratory studies significant for slight thrombocytopenia.  Abdominal CT shows constipation.  Patient given fleets  enema here as well as 1 to go home with and will follow-up in the cancer center this week Final Clinical Impression(s) / ED Diagnoses Final diagnoses:  None    Rx / DC Orders ED Discharge Orders     None        Lacretia Leigh, MD 09/17/20 2315

## 2020-09-17 NOTE — ED Provider Notes (Signed)
Emergency Medicine Provider Triage Evaluation Note  Gwendolyn Williams , a 80 y.o. female  was evaluated in triage.  Has a history of metastatic liver cancer and is currently on chemotherapy.  Pt complains of decreased oral intake, constipation, and generalized fatigue.  Denies that not had any bowel movement in the last 2 weeks.  Patient has been having MiraLAX daily over this time with no relief of symptoms.  Patient has had normal amounts of loose watery stool.  Patient has history of previous abdominal surgery.  Review of Systems  Positive: Constipation, abdominal pain, fatigue, creased appetite Negative: Nausea, vomiting, fever, chills  Physical Exam  BP 113/80 (BP Location: Right Arm)   Pulse (!) 118   Temp 98 F (36.7 C) (Oral)   Resp 16   Ht 4\' 11"  (1.499 m)   Wt 40.8 kg   SpO2 97%   BMI 18.18 kg/m  Gen:   Awake, no distress   Resp:  Normal effort  MSK:   Moves extremities without difficulty  Other:  Abdomen soft, nondistended, generalized tenderness throughout abdomen  Medical Decision Making  Medically screening exam initiated at 7:47 PM.  Appropriate orders placed.  Gwendolyn Williams was informed that the remainder of the evaluation will be completed by another provider, this initial triage assessment does not replace that evaluation, and the importance of remaining in the ED until their evaluation is complete.  The patient appears stable so that the remainder of the work up may be completed by another provider.      Gwendolyn Williams 09/17/20 1949    Lacretia Leigh, MD 09/20/20 2317

## 2020-09-17 NOTE — ED Notes (Signed)
ED Provider at bedside. 

## 2020-09-17 NOTE — ED Triage Notes (Addendum)
Pt has a hx of liver cancer and reports decreased PO intake and constipation. Also reports feeling more tired than usual. She is on Verzinio. PA at bedside.

## 2020-09-18 NOTE — ED Notes (Signed)
An After Visit Summary was printed and given to the patient. Discharge instructions given and no further questions at this time.  Pt leaving with daughter and husband.

## 2020-09-19 ENCOUNTER — Other Ambulatory Visit: Payer: Self-pay

## 2020-09-19 ENCOUNTER — Telehealth: Payer: Self-pay | Admitting: *Deleted

## 2020-09-19 ENCOUNTER — Encounter: Payer: Self-pay | Admitting: Physician Assistant

## 2020-09-19 ENCOUNTER — Inpatient Hospital Stay: Payer: Medicare Other

## 2020-09-19 ENCOUNTER — Other Ambulatory Visit: Payer: Self-pay | Admitting: Hematology

## 2020-09-19 ENCOUNTER — Other Ambulatory Visit (HOSPITAL_COMMUNITY): Payer: Self-pay

## 2020-09-19 VITALS — BP 127/89 | HR 115 | Resp 20

## 2020-09-19 DIAGNOSIS — C50919 Malignant neoplasm of unspecified site of unspecified female breast: Secondary | ICD-10-CM

## 2020-09-19 MED ORDER — SODIUM CHLORIDE 0.9 % IV SOLN
Freq: Once | INTRAVENOUS | Status: AC
Start: 2020-09-19 — End: 2020-09-19
  Filled 2020-09-19: qty 250

## 2020-09-19 NOTE — Progress Notes (Signed)
I spoke to Ms. Rulon Abide today in the infusion center.  Patient reports that she has rapidly declined since seeing Dr. Lorenso Courier on 09/07/2020.  Patient is extremely fatigued and bedridden.  She requires assistance to complete activities including self-care.  Patient has a poor appetite.  I called patient's husband, Mr. Corrina Steffensen who added that patient's has bowel incontinence as well.   I reviewed goals of care with the patient and her husband.  Due to rapid decline in patient's performance status, I expressed my concern to pursue any treatment and recommended hospice care.  I explained that hospice care would focus on symptoms of cancer and ensure that patient is comfortable.  Patient's husband was in agreement with a referral to hospice care but would like to discuss this with their children tonight.  Patient was visibly tearful during the conversation.  I will follow-up with the patient's husband tomorrow morning to confirm our recommendations to proceed with hospice care.  We will initiate the referral process to hospice.  Patient and her husband expressed understanding and was appreciative of the conversation.

## 2020-09-19 NOTE — Telephone Encounter (Signed)
Call made to [facility] for Hospice referral. Spoke with Gwendolyn Williams. Patient information provided and made aware to call husband, [name].

## 2020-09-19 NOTE — Patient Instructions (Signed)

## 2020-09-19 NOTE — Progress Notes (Signed)
Pt c/o shortness of breath over the past couple of days. This is a new symptom for her. She is unable to get out of wheelchair without a 2 person assist. Pt states she is extremely fatigued not relieved by rest. She has been sleeping most of the time over the past week. Pt also c/o swelling in both feet. HR 120's, O2 94% on 2L. MD aware.

## 2020-09-19 NOTE — Progress Notes (Signed)
Patient was escorted to lobby by husband via wheelchair. Vitals stable and patient in no distress.

## 2020-09-20 ENCOUNTER — Telehealth: Payer: Self-pay | Admitting: Physician Assistant

## 2020-09-20 ENCOUNTER — Other Ambulatory Visit: Payer: Self-pay | Admitting: Hematology and Oncology

## 2020-09-20 ENCOUNTER — Telehealth: Payer: Self-pay | Admitting: *Deleted

## 2020-09-20 ENCOUNTER — Other Ambulatory Visit: Payer: Self-pay | Admitting: Hematology

## 2020-09-20 MED ORDER — LORAZEPAM 0.5 MG PO TABS: 0.5000 mg | ORAL_TABLET | Freq: Three times a day (TID) | ORAL | 0 refills | Status: DC

## 2020-09-20 NOTE — Telephone Encounter (Signed)
Contacted by Marzetta Board with [facility] - patient daughter declined Hospice assessment and requested Palliative care. Palliative care assessment ok per Dr. Lorenso Courier [facility]:  [phone], opt 2)

## 2020-09-20 NOTE — Telephone Encounter (Signed)
I called back Mr. Gwendolyn Williams, to follow up on our conversation regarding hospice care. He had a chance to discuss with the patient and their children. They expressed their desire to further discuss with Dr. Rilda Bulls Limbo when he returns on Monday, 09/24/2020. They would like to cancel all appts for tomorrow, 09/21/2020 including infusion.   I will arrange for a follow up with Dr. Guinevere Stephenson Limbo on Monday, 09/24/2020 at 1pm. Patient's daughter is trying to coordinate palliative versus hospice care. I did add that we placed a referral to hospice care yesterday based on the conversation I had with the husband.   Patient's husband expressed understanding and satisfaction with the plan provided.

## 2020-09-21 ENCOUNTER — Encounter: Payer: Self-pay | Admitting: Hematology

## 2020-09-21 ENCOUNTER — Ambulatory Visit: Payer: Medicare Other | Admitting: Physician Assistant

## 2020-09-21 ENCOUNTER — Other Ambulatory Visit: Payer: Self-pay

## 2020-09-21 ENCOUNTER — Other Ambulatory Visit: Payer: Medicare Other

## 2020-09-21 ENCOUNTER — Encounter (HOSPITAL_COMMUNITY): Payer: Self-pay

## 2020-09-21 ENCOUNTER — Ambulatory Visit: Payer: Medicare Other

## 2020-09-21 ENCOUNTER — Inpatient Hospital Stay (HOSPITAL_COMMUNITY)
Admission: EM | Admit: 2020-09-21 | Discharge: 2020-09-24 | DRG: 193 | Disposition: A | Discharge status: Hospice | Payer: Medicare Other | Attending: Family Medicine | Admitting: Family Medicine

## 2020-09-21 ENCOUNTER — Emergency Department (HOSPITAL_COMMUNITY): Payer: Medicare Other

## 2020-09-21 DIAGNOSIS — I959 Hypotension, unspecified: Secondary | ICD-10-CM | POA: Diagnosis not present

## 2020-09-21 DIAGNOSIS — Z9981 Dependence on supplemental oxygen: Secondary | ICD-10-CM | POA: Diagnosis not present

## 2020-09-21 DIAGNOSIS — N183 Chronic kidney disease, stage 3 unspecified: Secondary | ICD-10-CM | POA: Diagnosis present

## 2020-09-21 DIAGNOSIS — K219 Gastro-esophageal reflux disease without esophagitis: Secondary | ICD-10-CM | POA: Diagnosis present

## 2020-09-21 DIAGNOSIS — Z681 Body mass index (BMI) 19 or less, adult: Secondary | ICD-10-CM | POA: Diagnosis not present

## 2020-09-21 DIAGNOSIS — I10 Essential (primary) hypertension: Secondary | ICD-10-CM | POA: Diagnosis present

## 2020-09-21 DIAGNOSIS — Z8052 Family history of malignant neoplasm of bladder: Secondary | ICD-10-CM | POA: Diagnosis not present

## 2020-09-21 DIAGNOSIS — J9601 Acute respiratory failure with hypoxia: Secondary | ICD-10-CM | POA: Diagnosis present

## 2020-09-21 DIAGNOSIS — J9600 Acute respiratory failure, unspecified whether with hypoxia or hypercapnia: Secondary | ICD-10-CM | POA: Diagnosis not present

## 2020-09-21 DIAGNOSIS — R531 Weakness: Secondary | ICD-10-CM | POA: Diagnosis not present

## 2020-09-21 DIAGNOSIS — Z66 Do not resuscitate: Secondary | ICD-10-CM | POA: Diagnosis not present

## 2020-09-21 DIAGNOSIS — Z808 Family history of malignant neoplasm of other organs or systems: Secondary | ICD-10-CM

## 2020-09-21 DIAGNOSIS — Z801 Family history of malignant neoplasm of trachea, bronchus and lung: Secondary | ICD-10-CM

## 2020-09-21 DIAGNOSIS — Z96642 Presence of left artificial hip joint: Secondary | ICD-10-CM | POA: Diagnosis present

## 2020-09-21 DIAGNOSIS — J45909 Unspecified asthma, uncomplicated: Secondary | ICD-10-CM | POA: Diagnosis present

## 2020-09-21 DIAGNOSIS — Z86718 Personal history of other venous thrombosis and embolism: Secondary | ICD-10-CM | POA: Diagnosis not present

## 2020-09-21 DIAGNOSIS — Z87891 Personal history of nicotine dependence: Secondary | ICD-10-CM

## 2020-09-21 DIAGNOSIS — D473 Essential (hemorrhagic) thrombocythemia: Secondary | ICD-10-CM | POA: Diagnosis present

## 2020-09-21 DIAGNOSIS — M818 Other osteoporosis without current pathological fracture: Secondary | ICD-10-CM | POA: Diagnosis present

## 2020-09-21 DIAGNOSIS — R451 Restlessness and agitation: Secondary | ICD-10-CM | POA: Diagnosis not present

## 2020-09-21 DIAGNOSIS — C50912 Malignant neoplasm of unspecified site of left female breast: Secondary | ICD-10-CM | POA: Diagnosis not present

## 2020-09-21 DIAGNOSIS — Z7189 Other specified counseling: Secondary | ICD-10-CM

## 2020-09-21 DIAGNOSIS — I130 Hypertensive heart and chronic kidney disease with heart failure and stage 1 through stage 4 chronic kidney disease, or unspecified chronic kidney disease: Secondary | ICD-10-CM | POA: Diagnosis present

## 2020-09-21 DIAGNOSIS — Z7901 Long term (current) use of anticoagulants: Secondary | ICD-10-CM | POA: Diagnosis not present

## 2020-09-21 DIAGNOSIS — R0902 Hypoxemia: Secondary | ICD-10-CM | POA: Diagnosis not present

## 2020-09-21 DIAGNOSIS — C787 Secondary malignant neoplasm of liver and intrahepatic bile duct: Secondary | ICD-10-CM

## 2020-09-21 DIAGNOSIS — R000 Tachycardia, unspecified: Secondary | ICD-10-CM | POA: Diagnosis not present

## 2020-09-21 DIAGNOSIS — Z20822 Contact with and (suspected) exposure to covid-19: Secondary | ICD-10-CM | POA: Diagnosis present

## 2020-09-21 DIAGNOSIS — D65 Disseminated intravascular coagulation [defibrination syndrome]: Secondary | ICD-10-CM | POA: Diagnosis present

## 2020-09-21 DIAGNOSIS — Z853 Personal history of malignant neoplasm of breast: Secondary | ICD-10-CM | POA: Diagnosis not present

## 2020-09-21 DIAGNOSIS — Z803 Family history of malignant neoplasm of breast: Secondary | ICD-10-CM | POA: Diagnosis not present

## 2020-09-21 DIAGNOSIS — N179 Acute kidney failure, unspecified: Secondary | ICD-10-CM | POA: Diagnosis present

## 2020-09-21 DIAGNOSIS — C50919 Malignant neoplasm of unspecified site of unspecified female breast: Secondary | ICD-10-CM

## 2020-09-21 DIAGNOSIS — Z515 Encounter for palliative care: Secondary | ICD-10-CM | POA: Diagnosis not present

## 2020-09-21 DIAGNOSIS — E785 Hyperlipidemia, unspecified: Secondary | ICD-10-CM | POA: Diagnosis present

## 2020-09-21 DIAGNOSIS — C7951 Secondary malignant neoplasm of bone: Secondary | ICD-10-CM | POA: Diagnosis present

## 2020-09-21 DIAGNOSIS — Z8249 Family history of ischemic heart disease and other diseases of the circulatory system: Secondary | ICD-10-CM

## 2020-09-21 DIAGNOSIS — Z881 Allergy status to other antibiotic agents status: Secondary | ICD-10-CM

## 2020-09-21 DIAGNOSIS — Z79899 Other long term (current) drug therapy: Secondary | ICD-10-CM

## 2020-09-21 DIAGNOSIS — I5033 Acute on chronic diastolic (congestive) heart failure: Secondary | ICD-10-CM | POA: Diagnosis not present

## 2020-09-21 DIAGNOSIS — E46 Unspecified protein-calorie malnutrition: Secondary | ICD-10-CM | POA: Diagnosis not present

## 2020-09-21 DIAGNOSIS — R0602 Shortness of breath: Secondary | ICD-10-CM | POA: Diagnosis not present

## 2020-09-21 DIAGNOSIS — Z882 Allergy status to sulfonamides status: Secondary | ICD-10-CM

## 2020-09-21 DIAGNOSIS — I5032 Chronic diastolic (congestive) heart failure: Secondary | ICD-10-CM | POA: Diagnosis present

## 2020-09-21 DIAGNOSIS — J189 Pneumonia, unspecified organism: Principal | ICD-10-CM | POA: Diagnosis present

## 2020-09-21 DIAGNOSIS — N1832 Chronic kidney disease, stage 3b: Secondary | ICD-10-CM | POA: Diagnosis present

## 2020-09-21 DIAGNOSIS — Z7401 Bed confinement status: Secondary | ICD-10-CM | POA: Diagnosis not present

## 2020-09-21 DIAGNOSIS — I509 Heart failure, unspecified: Secondary | ICD-10-CM | POA: Diagnosis not present

## 2020-09-21 DIAGNOSIS — M1990 Unspecified osteoarthritis, unspecified site: Secondary | ICD-10-CM | POA: Diagnosis present

## 2020-09-21 DIAGNOSIS — Z880 Allergy status to penicillin: Secondary | ICD-10-CM

## 2020-09-21 DIAGNOSIS — Z888 Allergy status to other drugs, medicaments and biological substances status: Secondary | ICD-10-CM

## 2020-09-21 LAB — CBC WITH DIFFERENTIAL/PLATELET
Band Neutrophils: 9 %
Basophils Relative: 1 %
Blasts: NONE SEEN %
Eosinophils Relative: 0 %
HCT: 34.4 % — ABNORMAL LOW (ref 36.0–46.0)
Hemoglobin: 10.5 g/dL — ABNORMAL LOW (ref 12.0–15.0)
Lymphocytes Relative: 7 %
MCH: 28.2 pg (ref 26.0–34.0)
MCHC: 30.5 g/dL (ref 30.0–36.0)
MCV: 92.2 fL (ref 80.0–100.0)
Metamyelocytes Relative: NONE SEEN %
Monocytes Relative: 0 %
Myelocytes: NONE SEEN %
Neutrophils Relative %: 83 %
Platelets: 87 10*3/uL — ABNORMAL LOW (ref 150–400)
Promyelocytes Relative: NONE SEEN %
RBC Morphology: NORMAL
RBC: 3.73 MIL/uL — ABNORMAL LOW (ref 3.87–5.11)
RDW: 22.6 % — ABNORMAL HIGH (ref 11.5–15.5)
WBC: 2.8 10*3/uL — ABNORMAL LOW (ref 4.0–10.5)
nRBC: 0 % (ref 0.0–0.2)
nRBC: NONE SEEN /100 WBC

## 2020-09-21 LAB — COMPREHENSIVE METABOLIC PANEL
ALT: 89 U/L — ABNORMAL HIGH (ref 0–44)
AST: 95 U/L — ABNORMAL HIGH (ref 15–41)
Albumin: 2.3 g/dL — ABNORMAL LOW (ref 3.5–5.0)
Alkaline Phosphatase: 197 U/L — ABNORMAL HIGH (ref 38–126)
Anion gap: 7 (ref 5–15)
BUN: 38 mg/dL — ABNORMAL HIGH (ref 8–23)
CO2: 19 mmol/L — ABNORMAL LOW (ref 22–32)
Calcium: 8.7 mg/dL — ABNORMAL LOW (ref 8.9–10.3)
Chloride: 109 mmol/L (ref 98–111)
Creatinine, Ser: 1.51 mg/dL — ABNORMAL HIGH (ref 0.44–1.00)
GFR, Estimated: 35 mL/min — ABNORMAL LOW (ref >60)
Glucose, Bld: 122 mg/dL — ABNORMAL HIGH (ref 70–99)
Potassium: 4.3 mmol/L (ref 3.5–5.1)
Sodium: 135 mmol/L (ref 135–145)
Total Bilirubin: 0.9 mg/dL (ref 0.3–1.2)
Total Protein: 5.3 g/dL — ABNORMAL LOW (ref 6.5–8.1)

## 2020-09-21 LAB — LACTIC ACID, PLASMA: Lactic Acid, Venous: 2 mmol/L (ref 0.5–1.9)

## 2020-09-21 MED ORDER — ACETAMINOPHEN 650 MG RE SUPP: 650.0000 mg | Freq: Four times a day (QID) | RECTAL | Status: DC | PRN

## 2020-09-21 MED ORDER — VANCOMYCIN HCL IN DEXTROSE 1-5 GM/200ML-% IV SOLN
1000.0000 mg | Freq: Once | INTRAVENOUS | Status: AC
  Administered 2020-09-22: 1000 mg via INTRAVENOUS
  Filled 2020-09-21: qty 200

## 2020-09-21 MED ORDER — SODIUM CHLORIDE 0.9 % IV SOLN
2.0000 g | Freq: Once | INTRAVENOUS | Status: AC
  Administered 2020-09-21: 2 g via INTRAVENOUS
  Filled 2020-09-21: qty 2

## 2020-09-21 MED ORDER — VANCOMYCIN HCL 500 MG/100ML IV SOLN
500.0000 mg | INTRAVENOUS | Status: DC
  Filled 2020-09-21: qty 100

## 2020-09-21 MED ORDER — ONDANSETRON HCL 4 MG/2ML IJ SOLN: 4.0000 mg | Freq: Four times a day (QID) | INTRAMUSCULAR | Status: DC | PRN

## 2020-09-21 MED ORDER — ACETAMINOPHEN 325 MG PO TABS: 650.0000 mg | ORAL_TABLET | Freq: Four times a day (QID) | ORAL | Status: DC | PRN

## 2020-09-21 MED ORDER — ONDANSETRON HCL 4 MG PO TABS: 4.0000 mg | ORAL_TABLET | Freq: Four times a day (QID) | ORAL | Status: DC | PRN

## 2020-09-21 MED ORDER — SODIUM CHLORIDE 0.9 % IV SOLN
1.0000 g | Freq: Three times a day (TID) | INTRAVENOUS | Status: DC
  Administered 2020-09-22 – 2020-09-23 (×4): 1 g via INTRAVENOUS
  Filled 2020-09-21 (×4): qty 1

## 2020-09-21 NOTE — Progress Notes (Signed)
Pharmacy Antibiotic Note  Gwendolyn Williams is a 80 y.o. female admitted on 09/21/2020 with pneumonia.  Pharmacy has been consulted for Vancomycin & Aztreonam dosing.  Noted PCN allergy- hives.  She has no documented history of using cephalosporins in [facility].  Scr elevated from baseline.   Plan: Aztreonam 1gm IV q8h Vancomycin 500mg  IV q36h to target AUC 400-550 Check Vancomycin levels at steady state Check MRSA PCR and consider d/c Vancomycin if negative Monitor renal function and cx data    Height: 4\' 11"  (149.9 cm) Weight: 40.8 kg (90 lb) IBW/kg (Calculated) : 43.2  Temp (24hrs), Avg:97.6 F (36.4 C), Min:97.5 F (36.4 C), Max:97.7 F (36.5 C)  Recent Labs  Lab 09/17/20 1950 09/17/20 2150 09/21/20 2217  WBC 3.8*  --  2.8*  CREATININE  --  1.07* 1.51*  LATICACIDVEN  --   --  2.0*    Estimated Creatinine Clearance: 20.1 mL/min (A) (by C-G formula based on SCr of 1.51 mg/dL (H)).    Allergies  Allergen Reactions   Atorvastatin     Other reaction(s): myalgia   Ciprofloxacin     Other reaction(s): rash   Lipitor [Atorvastatin Calcium]     Weakness in legs    Penicillins Hives    Has patient had a PCN reaction causing immediate rash, facial/tongue/throat swelling, SOB or lightheadedness with hypotension: Unknown Has patient had a PCN reaction causing severe rash involving mucus membranes or skin necrosis: Unknown Has patient had a PCN reaction that required hospitalization: No Has patient had a PCN reaction occurring within the last 10 years: No If all of the above answers are "NO", then may proceed with Cephalosporin use.    Pravastatin     Other reaction(s): dizzy (2010)   Sulfa Antibiotics Other (See Comments)    unknown Other reaction(s): unknown   Tamiflu [Oseltamivir Phosphate] Nausea And Vomiting    Antimicrobials this admission: 6/25 Aztreonam >>  6/25 Vancomycin >>   Dose adjustments this admission:  Microbiology results: 6/25 BCx: ordered 6/25 UCx:  ordered 6/25 MRSA PCR: ordered  Thank you for allowing pharmacy to be a part of this patient's care.  Netta Cedars PharmD 09/21/2020 11:27 PM

## 2020-09-21 NOTE — ED Provider Notes (Signed)
[facility] DEPT Provider Note   CSN: [id number] Arrival date & time: 09/21/20  2014     History Chief Complaint  Patient presents with   Shortness of Breath    Gwendolyn Williams is a 79 y.o. female.  80 year old female with past medical history below including metastatic breast cancer, hypertension, hyperlipidemia, blood clots on anticoagulation, CKD who presents with weakness and shortness of breath.  Family reports that patient has progressively declined over the past 3 to 4 weeks.  They have been waiting to follow-up with her oncologist who has been out of town for the past several weeks.  They have noted a significant decline in her functional status and generalized weakness as well as some edema of ankles.  She has had progressively worsening shortness of breath over the past several days.  A family friend who is a physician checked on her at the house today and noted abnormal lung sounds and recommended that she be evaluated.  She has had cough but no known fevers.  She has poor appetite and very Kariann Wecker intake.  She was recently in the ED for fecal impaction.  Patient reports some abdominal tenderness which she has had associated with known liver metastases.  The history is provided by a relative.  Shortness of Breath     Past Medical History:  Diagnosis Date   Arthritis    Asthma    controlled with singulair   Breast cancer ([place])    stage I left breast   Current use of long term anticoagulation 04/21/2017   Dyspnea, unspecified 02/24/2017   Started coincident with Hydrea, exertional and orthostatic, normal exam. O2 sat 100%.   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   Pneumonia    viral pneumonia as a kid   PONV (postoperative nausea and vomiting)     Patient Active Problem List   Diagnosis Date Noted   CKD (chronic kidney disease) stage 3, GFR 30-59 ml/min ([place]) 09/21/2020   Acute respiratory failure with hypoxia ([place]) 09/21/2020   Metastatic breast cancer ([place]) 08/20/2020   Bone metastases ([place]) 08/20/2020   Metastatic carcinoma involving liver with unknown primary site [facility])    Protein-calorie malnutrition, severe 08/09/2020   Hypokalemia 08/08/2020   Essential hypertension 04/19/2019   Bronchiectasis without complication ([place]) 60/73/7106   Mild reactive airways disease 02/17/2018   Abnormal cardiac CT angiography    Genetic testing 06/01/2017   Current use of long term anticoagulation 04/21/2017   Family history of breast cancer    Dyspnea on minimal exertion 02/24/2017   S/P left THA, 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   Hematoma of arm 08/09/2012   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   Myeloproliferative disorder ([place]) 01/06/2011   History of breast cancer in female 01/06/2011    Past Surgical History:  Procedure Laterality Date   BREAST SURGERY     Left breast lumpectomy sentinel node biopsy   CERVICAL SPINE SURGERY  05/23/2008   COLECTOMY  04/11/2008   left colectomy with end colostomy due to clot   COLOSTOMY TAKEDOWN  08/14/2008   EYE SURGERY  bil cataracts   HERNIA REPAIR  2011   LVH   INTRAVASCULAR PRESSURE WIRE/FFR STUDY N/A 01/25/2018   Procedure: INTRAVASCULAR PRESSURE WIRE/FFR STUDY;  Surgeon: Nelva Bush, MD;  Location: [facility];  Service: Cardiovascular;  Laterality: N/A;   LEFT HEART CATH AND CORONARY ANGIOGRAPHY N/A 01/25/2018   Procedure: LEFT HEART CATH AND CORONARY ANGIOGRAPHY;  Surgeon: Nelva Bush, MD;  Location: [facility];  Service: Cardiovascular;  Laterality: N/A;   LUMBAR DISC SURGERY  2006   TOTAL HIP  ARTHROPLASTY Left 10/07/2016   Procedure: LEFT TOTAL HIP ARTHROPLASTY ANTERIOR APPROACH;  Surgeon: Paralee Cancel, MD;  Location: [facility];  Service: Orthopedics;  Laterality: Left;  70 mins     OB History     Gravida  2   Para      Term      Preterm      AB  0   Living  2      SAB      IAB      Ectopic  0   Multiple      Live Births              Family History  Problem Relation Age of Onset   Breast cancer Mother 72   Lung cancer Mother    Heart disease Father    Basal cell carcinoma Father        x2   CAD Father    Ovarian cysts Sister    Dementia Sister    Breast cancer Maternal Aunt 43   Breast cancer Paternal Aunt 77   Ovarian cysts Maternal Grandmother        possibly had ovarian cancer as well   Breast cancer Maternal Aunt 55   Cancer Other        either pancreatic or colon   Other Daughter 48       breast lumpectomy- complex sclerosing lesion   Polycystic ovary syndrome Daughter    Bladder Cancer Cousin     Social History   Tobacco Use   Smoking status: Former    Packs/day: 1.00    Years: 5.00    Pack years: 5.00    Types: Cigarettes    Start date: 68    Quit date: 1964    Years since quitting: 58.5   Smokeless tobacco: Never  Vaping Use   Vaping Use: Never used  Substance Use Topics   Alcohol use: Yes    Alcohol/week: 0.0 standard drinks    Comment: Rare   Drug use: No    Home Medications Prior to Admission medications   Medication Sig Start Date End Date Taking? Authorizing Provider  abemaciclib (VERZENIO) 100 MG tablet Take 1 tablet (100 mg total) by mouth 2 (two) times daily. Swallow tablets whole. Do not chew, crush, or split tablets before swallowing. 08/20/20   Brunetta Genera, MD  acetaminophen (TYLENOL) 500 MG tablet Take 500 mg by mouth every 6 (six) hours as needed for moderate pain.    [provider]  albuterol (VENTOLIN HFA) 108 (90 Base) MCG/ACT inhaler 1 puff as needed    [provider]   amLODipine (NORVASC) 2.5 MG tablet Take 2.5 mg by mouth daily.    [provider]  amLODipine (NORVASC) 5 MG tablet TAKE ONE TABLET BY MOUTH DAILY FOR BLOOD PRESSURE    [provider]  Biotin 10 MG TABS Take 1 tablet by mouth daily.    [provider]  buPROPion (  WELLBUTRIN XL) 150 MG 24 hr tablet 1 tablet in the morning 08/02/20   [provider]  Cholecalciferol (VITAMIN D3) 50 MCG (2000 UT) CAPS 2 capsules    [provider]  Coenzyme Q10 (CO Q-10) 100 MG CAPS Take 1 capsule by mouth daily.    [provider]  dexamethasone (DECADRON) 1 MG tablet Take 4 tablets (4 mg total) by mouth daily for 7 days, THEN 3 tablets (3 mg total) daily for 7 days, THEN 2 tablets (2 mg total) daily for 7 days, THEN 1 tablet (1 mg total) daily for 7 days. 08/28/20 09/25/20  Brunetta Genera, MD  dexamethasone (DECADRON) 4 MG tablet 1 tablet 08/10/20   [provider]  docusate sodium (COLACE) 100 MG capsule Take 1 capsule (100 mg total) by mouth 2 (two) times daily. Patient taking differently: Take 100 mg by mouth daily. 10/07/16   Danae Orleans, PA-C  esomeprazole (NEXIUM) 40 MG capsule Take 1 capsule by mouth daily.    [provider]  fluticasone furoate-vilanterol (BREO ELLIPTA) 200-25 MCG/INH AEPB USE 1 INHALATION DAILY 01/23/20   Margaretha Seeds, MD  folic acid (FOLVITE) 1 MG tablet Take 1 mg by mouth daily.    [provider]  hydroxyurea (HYDREA) 500 MG capsule TAKE ONE CAPSULE BY MOUTH DAILY WITH FOOD TO MINIMIZE GI SIDE EFFECTS Patient not taking: Reported on 08/25/2020 07/09/20   Brunetta Genera, MD  hyoscyamine (LEVSIN SL) 0.125 MG SL tablet Take 1 to 3 tablets three times daily as needed Patient taking differently: Take 0.125 mg by mouth every 6 (six) hours as needed for cramping. 05/07/20   Milus Banister, MD  LORazepam (ATIVAN) 0.5 MG tablet Take 1 tablet (0.5 mg total) by mouth every 8 (eight) hours. 09/20/20    Orson Slick, MD  meclizine (ANTIVERT) 25 MG tablet 1 tablet as needed    [provider]  ondansetron (ZOFRAN) 4 MG tablet Take 1 tablet (4 mg total) by mouth every 6 (six) hours as needed for nausea. 08/28/20   Brunetta Genera, MD  ondansetron (ZOFRAN-ODT) 4 MG disintegrating tablet 1 tablet on the tongue and allow to dissolve    [provider]  polyethylene glycol (MIRALAX / GLYCOLAX) packet Take 17 g by mouth 2 (two) times daily. Patient taking differently: Take 17 g by mouth daily. 10/07/16   Danae Orleans, PA-C  potassium chloride (KLOR-CON) 8 MEQ tablet Take 1 tablet (8 mEq total) by mouth daily. 08/10/20   Danford, Suann Larry, MD  potassium chloride (KLOR-CON) 8 MEQ tablet Take 1 tablet (8 mEq total) by mouth 2 (two) times daily. 08/28/20   Brunetta Genera, MD  promethazine (PHENERGAN) 25 MG tablet 1 tablet 08/06/20   [provider]  rivaroxaban (XARELTO) 10 MG TABS tablet 1 tablet with food    [provider]  rosuvastatin (CRESTOR) 40 MG tablet Take 1 tablet (40 mg total) by mouth daily. 11/25/19   Skeet Latch, MD  Spacer/Aero-Holding Josiah Lobo DEVI 1 Device by Does not apply route as directed. 04/15/18   Margaretha Seeds, MD  triamterene-hydrochlorothiazide (MAXZIDE-25) 37.5-25 MG tablet 1 tablet    [provider]    Allergies    Atorvastatin, Ciprofloxacin, Lipitor [atorvastatin calcium], Penicillins, Pravastatin, Sulfa antibiotics, and Tamiflu [oseltamivir phosphate]  Review of Systems   Review of Systems  Respiratory:  Positive for shortness of breath.   All other systems reviewed and are negative except that which was mentioned in HPI  Physical Exam Updated Vital Signs BP 119/83   Pulse (!) 103   Temp (!) 97.5 F (36.4 C) (Oral)   Resp (!) 27   Ht 4\' 11"  (1.499 m)   Wt 40.8 kg   SpO2 93%   BMI 18.18 kg/m   Physical Exam Vitals and nursing note reviewed.  Constitutional:      General: She is not in  acute distress.    Appearance: Normal appearance.     Comments: Frail elderly woman awake and alert  HENT:     Head: Normocephalic and atraumatic.  Eyes:     Conjunctiva/sclera: Conjunctivae normal.  Cardiovascular:     Rate and Rhythm: Regular rhythm. Tachycardia present.     Heart sounds: Normal heart sounds. No murmur heard. Pulmonary:     Effort: No respiratory distress.     Breath sounds: Rales present.     Comments: Tachypnea, crackles in b/l bases Abdominal:     General: Abdomen is flat. Bowel sounds are normal. There is no distension.     Palpations: Abdomen is soft.     Tenderness: There is abdominal tenderness.     Comments: RUQ and LUQ tenderness to palpation, no peritonitis  Musculoskeletal:     Right lower leg: Edema present.     Left lower leg: Edema present.     Comments: Mild pitting edema b/l ankles  Skin:    General: Skin is warm and dry.  Neurological:     Mental Status: She is alert and oriented to person, place, and time.     Comments: fluent  Psychiatric:        Mood and Affect: Mood normal.        Behavior: Behavior normal.    ED Results / Procedures / Treatments   Labs (all labs ordered are listed, but only abnormal results are displayed) Labs Reviewed  CBC WITH DIFFERENTIAL/PLATELET - Abnormal; Notable for the following components:      Result Value   WBC 2.8 (*)    RBC 3.73 (*)    Hemoglobin 10.5 (*)    HCT 34.4 (*)    RDW 22.6 (*)    Platelets 87 (*)    All other components within normal limits  COMPREHENSIVE METABOLIC PANEL - Abnormal; Notable for the following components:   CO2 19 (*)    Glucose, Bld 122 (*)    BUN 38 (*)    Creatinine, Ser 1.51 (*)    Calcium 8.7 (*)    Total Protein 5.3 (*)    Albumin 2.3 (*)    AST 95 (*)    ALT 89 (*)    Alkaline Phosphatase 197 (*)    GFR, Estimated 35 (*)    All other components within normal limits  LACTIC ACID, PLASMA - Abnormal; Notable for the following components:   Lactic Acid,  Venous 2.0 (*)    All other components within normal limits  URINE CULTURE  CULTURE, BLOOD (ROUTINE X 2)  CULTURE, BLOOD (ROUTINE X 2)  RESP PANEL BY RT-PCR (FLU A&B, COVID) ARPGX2  MRSA NEXT GEN BY PCR, NASAL  URINALYSIS, ROUTINE W REFLEX MICROSCOPIC  PROCALCITONIN  BRAIN NATRIURETIC PEPTIDE  DIC (DISSEMINATED INTRAVASCULAR COAGULATION)PANEL  CBC  BASIC METABOLIC PANEL    EKG EKG Interpretation  Date/Time:  Friday September 21 2020 21:40:15 EDT Ventricular Rate:  106 PR Interval:  138 QRS Duration: 81 QT Interval:  331 QTC Calculation: 440 R Axis:   257 Text Interpretation: Sinus tachycardia Left anterior fascicular block  Anterior infarct, old tachycardia new from previous Confirmed by Theotis Burrow [phone]) on 09/21/2020 10:19:25 PM  Radiology DG Chest Port 1 View  Result Date: 09/21/2020 CLINICAL DATA:  Shortness of breath EXAM: PORTABLE CHEST 1 VIEW COMPARISON:  11/23/2017 trauma CT 08/07/2020 FINDINGS: Surgical hardware in the cervical spine. Hazy airspace disease in the lower lungs concerning for pneumonia. Stable cardiomediastinal silhouette with aortic atherosclerosis. No pneumothorax IMPRESSION: New hazy mid to lower lung opacity concerning for pneumonia Electronically Signed   By: Donavan Foil M.D.   On: 09/21/2020 22:06    Procedures Procedures  CRITICAL CARE Performed by: Wenda Overland Brendolyn Stockley   Total critical care time: 30 minutes  Critical care time was exclusive of separately billable procedures and treating other patients.  Critical care was necessary to treat or prevent imminent or life-threatening deterioration.  Critical care was time spent personally by me on the following activities: development of treatment plan with patient and/or surrogate as well as nursing, discussions with consultants, evaluation of patient's response to treatment, examination of patient, obtaining history from patient or surrogate, ordering and performing treatments and interventions,  ordering and review of laboratory studies, ordering and review of radiographic studies, pulse oximetry and re-evaluation of patient's condition.  Medications Ordered in ED Medications  aztreonam (AZACTAM) 2 g in sodium chloride 0.9 % 100 mL IVPB (2 g Intravenous New Bag/Given 09/21/20 2330)  vancomycin (VANCOCIN) IVPB 1000 mg/200 mL premix (has no administration in time range)  acetaminophen (TYLENOL) tablet 650 mg (has no administration in time range)    Or  acetaminophen (TYLENOL) suppository 650 mg (has no administration in time range)  ondansetron (ZOFRAN) tablet 4 mg (has no administration in time range)    Or  ondansetron (ZOFRAN) injection 4 mg (has no administration in time range)  aztreonam (AZACTAM) 1 g in sodium chloride 0.9 % 100 mL IVPB (has no administration in time range)  vancomycin (VANCOREADY) IVPB 500 mg/100 mL (has no administration in time range)    ED Course  I have reviewed the triage vital signs and the nursing notes.  Pertinent labs & imaging results that were available during my care of the patient were reviewed by me and considered in my medical decision making (see chart for details).    MDM Rules/Calculators/A&P                          Ill-appearing but nontoxic on exam, afebrile.  Requiring 3 L supplemental O2 which is new for her.  X-ray with lower lung opacities suggestive of pneumonia, differential includes CHF.  Because of her immunocompromised state, obtained blood cultures and gave broad-spectrum antibiotics including aztreonam and vancomycin based on allergies.  Lactate 2.0, BUN 38, creatinine 1.5, WBC 2.8, hemoglobin 10.5.  Added BNP.  Discussed admission with [facility] hospitalist, Dr. Alcario Drought.  Family has expressed desire to involve palliative care during hospitalization. Final Clinical Impression(s) / ED Diagnoses Final diagnoses:  Acute respiratory failure with hypoxia [facility])  Metastatic breast cancer [facility])    Rx / DC Orders ED Discharge Orders      None        Starling Christofferson, Wenda Overland, MD 09/21/20 2359

## 2020-09-21 NOTE — Progress Notes (Signed)
A consult was received from an ED physician for Vancomycin per pharmacy dosing.  The patient's profile has been reviewed for ht/wt/allergies/indication/available labs.   Aztreonam also ordered.  No documented hx of cephalosporin use in past. A one time order has been placed for Vancomycin 1gm IV x1.  Further antibiotics/pharmacy consults should be ordered by admitting physician if indicated.                       Thank you, Netta Cedars PharmD 09/21/2020  10:29 PM

## 2020-09-21 NOTE — ED Triage Notes (Signed)
Pt has hx of metastatic breast cancer that has metastasized to his liver. She has had increased SHOB and lower extremity edema over the past few days. Pt has progressively become weak over the past 3-4 weeks.

## 2020-09-22 ENCOUNTER — Inpatient Hospital Stay (HOSPITAL_COMMUNITY): Payer: Medicare Other

## 2020-09-22 DIAGNOSIS — Z7901 Long term (current) use of anticoagulants: Secondary | ICD-10-CM

## 2020-09-22 DIAGNOSIS — N183 Chronic kidney disease, stage 3 unspecified: Secondary | ICD-10-CM

## 2020-09-22 DIAGNOSIS — I5033 Acute on chronic diastolic (congestive) heart failure: Secondary | ICD-10-CM

## 2020-09-22 DIAGNOSIS — R531 Weakness: Secondary | ICD-10-CM

## 2020-09-22 DIAGNOSIS — I10 Essential (primary) hypertension: Secondary | ICD-10-CM

## 2020-09-22 DIAGNOSIS — Z515 Encounter for palliative care: Secondary | ICD-10-CM

## 2020-09-22 DIAGNOSIS — Z7189 Other specified counseling: Secondary | ICD-10-CM

## 2020-09-22 DIAGNOSIS — J9601 Acute respiratory failure with hypoxia: Secondary | ICD-10-CM

## 2020-09-22 LAB — ECHOCARDIOGRAM COMPLETE
Area-P 1/2: 4.6 cm2
Height: 59 in
P 1/2 time: 196 msec
S' Lateral: 2.6 cm
Weight: 1440 oz

## 2020-09-22 LAB — CBC
HCT: 31.9 % — ABNORMAL LOW (ref 36.0–46.0)
Hemoglobin: 9.5 g/dL — ABNORMAL LOW (ref 12.0–15.0)
MCH: 28.3 pg (ref 26.0–34.0)
MCHC: 29.8 g/dL — ABNORMAL LOW (ref 30.0–36.0)
MCV: 94.9 fL (ref 80.0–100.0)
Platelets: 88 10*3/uL — ABNORMAL LOW (ref 150–400)
RBC: 3.36 MIL/uL — ABNORMAL LOW (ref 3.87–5.11)
RDW: 23.2 % — ABNORMAL HIGH (ref 11.5–15.5)
WBC: 3 10*3/uL — ABNORMAL LOW (ref 4.0–10.5)
nRBC: 0 % (ref 0.0–0.2)

## 2020-09-22 LAB — URINALYSIS, ROUTINE W REFLEX MICROSCOPIC
Bacteria, UA: NONE SEEN
Bilirubin Urine: NEGATIVE
Glucose, UA: NEGATIVE mg/dL
Ketones, ur: NEGATIVE mg/dL
Nitrite: NEGATIVE
Protein, ur: 100 mg/dL — AB
Specific Gravity, Urine: 1.019 (ref 1.005–1.030)
pH: 5 (ref 5.0–8.0)

## 2020-09-22 LAB — DIC (DISSEMINATED INTRAVASCULAR COAGULATION)PANEL
D-Dimer, Quant: 16.54 ug/mL-FEU — ABNORMAL HIGH (ref 0.00–0.50)
Fibrinogen: 530 mg/dL — ABNORMAL HIGH (ref 210–475)
INR: 2 — ABNORMAL HIGH (ref 0.8–1.2)
Platelets: 82 10*3/uL — ABNORMAL LOW (ref 150–400)
Prothrombin Time: 22.7 seconds — ABNORMAL HIGH (ref 11.4–15.2)
aPTT: 32 seconds (ref 24–36)

## 2020-09-22 LAB — BASIC METABOLIC PANEL
Anion gap: 8 (ref 5–15)
BUN: 38 mg/dL — ABNORMAL HIGH (ref 8–23)
CO2: 19 mmol/L — ABNORMAL LOW (ref 22–32)
Calcium: 8.6 mg/dL — ABNORMAL LOW (ref 8.9–10.3)
Chloride: 107 mmol/L (ref 98–111)
Creatinine, Ser: 1.44 mg/dL — ABNORMAL HIGH (ref 0.44–1.00)
GFR, Estimated: 37 mL/min — ABNORMAL LOW (ref >60)
Glucose, Bld: 93 mg/dL (ref 70–99)
Potassium: 4.5 mmol/L (ref 3.5–5.1)
Sodium: 134 mmol/L — ABNORMAL LOW (ref 135–145)

## 2020-09-22 LAB — RETICULOCYTES
Immature Retic Fract: 17.5 % — ABNORMAL HIGH (ref 2.3–15.9)
RBC.: 3.85 MIL/uL — ABNORMAL LOW (ref 3.87–5.11)
Retic Count, Absolute: 48.5 10*3/uL (ref 19.0–186.0)
Retic Ct Pct: 1.3 % (ref 0.4–3.1)

## 2020-09-22 LAB — PROCALCITONIN: Procalcitonin: 0.45 ng/mL

## 2020-09-22 LAB — AMMONIA: Ammonia: 25 umol/L (ref 9–35)

## 2020-09-22 LAB — RESP PANEL BY RT-PCR (FLU A&B, COVID) ARPGX2
Influenza A by PCR: NEGATIVE
Influenza B by PCR: NEGATIVE
SARS Coronavirus 2 by RT PCR: NEGATIVE

## 2020-09-22 LAB — SAVE SMEAR(SSMR), FOR PROVIDER SLIDE REVIEW

## 2020-09-22 LAB — BRAIN NATRIURETIC PEPTIDE: B Natriuretic Peptide: 76 pg/mL (ref 0.0–100.0)

## 2020-09-22 LAB — LACTATE DEHYDROGENASE: LDH: 679 U/L — ABNORMAL HIGH (ref 98–192)

## 2020-09-22 LAB — PREALBUMIN: Prealbumin: 6.8 mg/dL — ABNORMAL LOW (ref 18–38)

## 2020-09-22 MED ORDER — FUROSEMIDE 20 MG PO TABS
20.0000 mg | ORAL_TABLET | Freq: Every day | ORAL | Status: DC
  Administered 2020-09-22 – 2020-09-23 (×2): 20 mg via ORAL
  Filled 2020-09-22 (×2): qty 1

## 2020-09-22 MED ORDER — ROSUVASTATIN CALCIUM 20 MG PO TABS
40.0000 mg | ORAL_TABLET | Freq: Every day | ORAL | Status: DC
  Administered 2020-09-22 – 2020-09-23 (×2): 40 mg via ORAL
  Filled 2020-09-22 (×2): qty 2

## 2020-09-22 MED ORDER — RIVAROXABAN 10 MG PO TABS
10.0000 mg | ORAL_TABLET | Freq: Every day | ORAL | Status: DC
  Administered 2020-09-22 – 2020-09-23 (×2): 10 mg via ORAL
  Filled 2020-09-22 (×3): qty 1

## 2020-09-22 MED ORDER — FLUTICASONE FUROATE-VILANTEROL 200-25 MCG/INH IN AEPB
1.0000 | INHALATION_SPRAY | Freq: Every day | RESPIRATORY_TRACT | Status: DC
  Administered 2020-09-23: 1 via RESPIRATORY_TRACT
  Filled 2020-09-22: qty 28

## 2020-09-22 MED ORDER — POTASSIUM CHLORIDE CRYS ER 10 MEQ PO TBCR
10.0000 meq | EXTENDED_RELEASE_TABLET | Freq: Two times a day (BID) | ORAL | Status: DC
  Administered 2020-09-22 – 2020-09-23 (×3): 10 meq via ORAL
  Filled 2020-09-22 (×3): qty 1

## 2020-09-22 MED ORDER — RIVAROXABAN 20 MG PO TABS: 20.0000 mg | ORAL_TABLET | Freq: Every day | ORAL | Status: DC

## 2020-09-22 MED ORDER — BUPROPION HCL ER (XL) 150 MG PO TB24
150.0000 mg | ORAL_TABLET | Freq: Every day | ORAL | Status: DC
  Administered 2020-09-22 – 2020-09-23 (×2): 150 mg via ORAL
  Filled 2020-09-22 (×3): qty 1

## 2020-09-22 MED ORDER — LACTATED RINGERS IV SOLN: INTRAVENOUS | Status: DC

## 2020-09-22 MED ORDER — LORAZEPAM 0.5 MG PO TABS
0.5000 mg | ORAL_TABLET | Freq: Three times a day (TID) | ORAL | Status: DC | PRN
  Administered 2020-09-22 – 2020-09-24 (×4): 0.5 mg via ORAL
  Filled 2020-09-22 (×5): qty 1

## 2020-09-22 MED ORDER — DEXAMETHASONE 2 MG PO TABS
1.0000 mg | ORAL_TABLET | Freq: Every day | ORAL | Status: DC
  Administered 2020-09-22 – 2020-09-23 (×2): 1 mg via ORAL
  Filled 2020-09-22 (×3): qty 0.5

## 2020-09-22 MED ORDER — LORAZEPAM 0.5 MG PO TABS: 0.5000 mg | ORAL_TABLET | Freq: Every day | ORAL | Status: DC

## 2020-09-22 MED ORDER — PANTOPRAZOLE SODIUM 40 MG PO TBEC
80.0000 mg | DELAYED_RELEASE_TABLET | Freq: Every day | ORAL | Status: DC
  Administered 2020-09-22 – 2020-09-23 (×2): 80 mg via ORAL
  Filled 2020-09-22 (×2): qty 2

## 2020-09-22 MED ORDER — DOCUSATE SODIUM 100 MG PO CAPS: 100.0000 mg | ORAL_CAPSULE | Freq: Two times a day (BID) | ORAL | Status: DC | PRN

## 2020-09-22 NOTE — ED Notes (Signed)
ED TO INPATIENT HANDOFF REPORT  Name/Age/Gender Gwendolyn Williams 80 y.o. female  Code Status    Code Status Orders  (From admission, onward)         Start     Ordered   09/21/20 2321  Full code  Continuous        09/21/20 2323        Code Status History    Date Active Date Inactive Code Status Order ID Comments User Context   08/08/2020 1555 08/10/2020 1619 Full Code [id number]  Jonnie Finner, DO Inpatient   01/25/2018 1430 01/25/2018 2012 Full Code [id number]  Nelva Bush, MD Inpatient   10/07/2016 1532 10/09/2016 1606 Full Code [id number]  Danae Orleans, PA-C Inpatient    Advance Directive Documentation   Flowsheet Row Most Recent Value  Type of Advance Directive Healthcare Power of Attorney, Living will  Pre-existing out of facility DNR order (yellow form or pink MOST form) --  "MOST" Form in Place? --      Home/SNF/Other Home  Chief Complaint Acute respiratory failure with hypoxia ([place]) [J96.01]  Level of Care/Admitting Diagnosis ED Disposition    ED Disposition  Admit   Condition  --   [place]: [place] [[id number]]  Level of Care: Telemetry [5]  Admit to tele based on following criteria: Acute CHF  May admit patient to [facility] or [facility] if equivalent level of care is available:: No  Covid Evaluation: Symptomatic Person Under Investigation (PUI)  Diagnosis: Acute respiratory failure with hypoxia [facility]) [[id number]]  Admitting Physician: Doreatha Massed  Attending Physician: Etta Quill [phone]  Estimated length of stay: past midnight tomorrow  Certification:: I certify this patient will need inpatient services for at least 2 midnights         Medical History Past Medical History:  Diagnosis Date  . Arthritis   . Asthma    controlled with singulair  . Breast cancer ([place])    stage I left breast  . Current use of long term anticoagulation 04/21/2017  . Dyspnea, unspecified 02/24/2017   Started  coincident with Hydrea, exertional and orthostatic, normal exam. O2 sat 100%.  . Family history of breast cancer   . GERD (gastroesophageal reflux disease)   . H/O blood clots    clotting disorder since 2010  . Hematoma of arm 08/09/2012  . 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 Hyperlipidemia   . Hypertension   . 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 Pneumonia    viral pneumonia as a kid  . PONV (postoperative nausea and vomiting)     Allergies Allergies  Allergen Reactions  . Atorvastatin     Other reaction(s): myalgia  . Ciprofloxacin     Other reaction(s): rash  . Lipitor [Atorvastatin Calcium]     Weakness in legs   . Penicillins Hives    Has patient had a PCN reaction causing immediate rash, facial/tongue/throat swelling, SOB or lightheadedness with hypotension: Unknown Has patient had a PCN reaction causing severe rash involving mucus membranes or skin necrosis: Unknown Has patient had a PCN reaction that required hospitalization: No Has patient had a PCN reaction occurring within the last 10 years: No If all of the above answers are "NO", then may proceed with Cephalosporin use.   . Pravastatin     Other reaction(s): dizzy (2010)  . Sulfa Antibiotics Other (See  Comments)    unknown Other reaction(s): unknown  . Tamiflu [Oseltamivir Phosphate] Nausea And Vomiting    IV Location/Drains/Wounds Patient Lines/Drains/Airways Status    Active Line/Drains/Airways    Name Placement date Placement time Site Days   Peripheral IV 09/21/20 20 G Left Antecubital 09/21/20  2328  Antecubital  1   Peripheral IV 09/21/20 20 G Right Antecubital 09/21/20  2331  Antecubital  1   Incision (Closed) 10/07/16 Hip Left 10/07/16  1223  -- 1446          Labs/Imaging Results for orders placed or performed during the hospital encounter of 09/21/20 (from the past 48 hour(s))  CBC with Differential      Status: Abnormal   Collection Time: 09/21/20 10:17 PM  Result Value Ref Range   WBC 2.8 (L) 4.0 - 10.5 K/uL   RBC 3.73 (L) 3.87 - 5.11 MIL/uL   Hemoglobin 10.5 (L) 12.0 - 15.0 g/dL   HCT 34.4 (L) 36.0 - 46.0 %   MCV 92.2 80.0 - 100.0 fL   MCH 28.2 26.0 - 34.0 pg   MCHC 30.5 30.0 - 36.0 g/dL   RDW 22.6 (H) 11.5 - 15.5 %   Platelets 87 (L) 150 - 400 K/uL    Comment: Immature Platelet Fraction may be clinically indicated, consider ordering this additional test [id number]    nRBC 0.0 0.0 - 0.2 %   Neutrophils Relative % 83 %   Band Neutrophils 9 %   Lymphocytes Relative 7 %   Monocytes Relative 0 %   Eosinophils Relative 0 %   Basophils Relative 1 %   WBC Morphology VACUOLES IN THE NEUTROPHILS    RBC Morphology NORMAL    Smear Review DONE    nRBC NONE SEEN 0 /100 WBC   Metamyelocytes Relative NONE SEEN %   Myelocytes NONE SEEN %   Promyelocytes Relative NONE SEEN %   Blasts NONE SEEN %    Comment: Performed at [facility], [place] [address], [place], [place] [address]  Comprehensive metabolic panel     Status: Abnormal   Collection Time: 09/21/20 10:17 PM  Result Value Ref Range   Sodium 135 135 - 145 mmol/L   Potassium 4.3 3.5 - 5.1 mmol/L   Chloride 109 98 - 111 mmol/L   CO2 19 (L) 22 - 32 mmol/L   Glucose, Bld 122 (H) 70 - 99 mg/dL    Comment: Glucose reference range applies only to samples taken after fasting for at least 8 hours.   BUN 38 (H) 8 - 23 mg/dL   Creatinine, Ser 1.51 (H) 0.44 - 1.00 mg/dL   Calcium 8.7 (L) 8.9 - 10.3 mg/dL   Total Protein 5.3 (L) 6.5 - 8.1 g/dL   Albumin 2.3 (L) 3.5 - 5.0 g/dL   AST 95 (H) 15 - 41 U/L   ALT 89 (H) 0 - 44 U/L   Alkaline Phosphatase 197 (H) 38 - 126 U/L   Total Bilirubin 0.9 0.3 - 1.2 mg/dL   GFR, Estimated 35 (L) >60 mL/min    Comment: (NOTE) Calculated using the CKD-EPI Creatinine Equation (2021)    Anion gap 7 5 - 15    Comment: Performed at [facility], [place] [address]  [place], [place] [address]  Lactic acid, plasma     Status: Abnormal   Collection Time: 09/21/20 10:17 PM  Result Value Ref Range   Lactic Acid, Venous 2.0 (HH) 0.5 - 1.9 mmol/L    Comment: CRITICAL  RESULT CALLED TO, READ BACK BY AND VERIFIED WITH: SONYA LAMB AT 2304 ON 09/21/20 BY Buel Ream Performed at [facility], [place] [address] [place], [place] [address]   DIC Panel ONCE - STAT     Status: Abnormal (Preliminary result)   Collection Time: 09/21/20 11:27 PM  Result Value Ref Range   Prothrombin Time 22.7 (H) 11.4 - 15.2 seconds   INR 2.0 (H) 0.8 - 1.2    Comment: (NOTE) INR goal varies based on device and disease states.    aPTT 32 24 - 36 seconds   Fibrinogen 530 (H) 210 - 475 mg/dL   D-Dimer, Quant 16.54 (H) 0.00 - 0.50 ug/mL-FEU    Comment: (NOTE) At the manufacturer cut-off value of 0.5 g/mL FEU, this assay has a negative predictive value of 95-100%.This assay is intended for use in conjunction with a clinical pretest probability (PTP) assessment model to exclude pulmonary embolism (PE) and deep venous thrombosis (DVT) in outpatients suspected of PE or DVT. Results should be correlated with clinical presentation. Performed at [facility], [place] [address], [place], [place] [address]    Platelets PENDING 150 - 400 K/uL   Smear Review PENDING    DG Chest Port 1 View  Result Date: 09/21/2020 CLINICAL DATA:  Shortness of breath EXAM: PORTABLE CHEST 1 VIEW COMPARISON:  11/23/2017 trauma CT 08/07/2020 FINDINGS: Surgical hardware in the cervical spine. Hazy airspace disease in the lower lungs concerning for pneumonia. Stable cardiomediastinal silhouette with aortic atherosclerosis. No pneumothorax IMPRESSION: New hazy mid to lower lung opacity concerning for pneumonia Electronically Signed   By: Donavan Foil M.D.   On: 09/21/2020 22:06    Pending Labs Unresulted Labs (From admission, onward)    Start     Ordered   09/22/20 0500  CBC  Tomorrow  morning,   R        09/21/20 2323   09/22/20 9678  Basic metabolic panel  Tomorrow morning,   R        09/21/20 2323   09/21/20 2330  MRSA Next Gen by PCR, Nasal  Once,   STAT        09/21/20 2344   09/21/20 2306  Procalcitonin - Baseline  ONCE - STAT,   STAT        09/21/20 2305   09/21/20 2306  Brain natriuretic peptide  ONCE - STAT,   STAT        09/21/20 2305   09/21/20 2306  Resp Panel by RT-PCR (Flu A&B, Covid) Nasopharyngeal Swab  (Tier 2 - Symptomatic/asymptomatic with Precautions )  ONCE - STAT,   STAT       Question Answer Comment  Is this test for diagnosis or screening Diagnosis of ill patient   Symptomatic for COVID-19 as defined by CDC Unknown   Hospitalized for COVID-19 No   Admitted to ICU for COVID-19 No   Previously tested for COVID-19 Yes   Resident in a congregate (group) care setting Unknown   Employed in healthcare setting Unknown   Pregnant No   Has patient completed COVID vaccination(s) (2 doses of Pfizer/Moderna 1 dose of [employer]) Unknown      09/21/20 2306   09/21/20 2220  Urine culture  ONCE - STAT,   STAT        09/21/20 2219   09/21/20 2220  Culture, blood (routine x 2)  BLOOD CULTURE X 2,   STAT      09/21/20 2219   09/21/20  2150  Urinalysis, Routine w reflex microscopic  ONCE - STAT,   STAT        09/21/20 2149          Vitals/Pain Today's Vitals   09/21/20 2300 09/21/20 2315 09/21/20 2330 09/22/20 0000  BP: 121/81 122/75 111/81 110/75  Pulse: (!) 102 (!) 104 (!) 104 98  Resp: (!) 30 (!) 29 (!) 26 (!) 23  Temp:      TempSrc:      SpO2: 92% 93% 93% 98%  Weight:      Height:      PainSc:        Isolation Precautions No active isolations  Medications Medications  vancomycin (VANCOCIN) IVPB 1000 mg/200 mL premix (1,000 mg Intravenous New Bag/Given 09/22/20 0012)  acetaminophen (TYLENOL) tablet 650 mg (has no administration in time range)    Or  acetaminophen (TYLENOL) suppository 650 mg (has no administration in time range)   ondansetron (ZOFRAN) tablet 4 mg (has no administration in time range)    Or  ondansetron (ZOFRAN) injection 4 mg (has no administration in time range)  aztreonam (AZACTAM) 1 g in sodium chloride 0.9 % 100 mL IVPB (has no administration in time range)  vancomycin (VANCOREADY) IVPB 500 mg/100 mL (has no administration in time range)  aztreonam (AZACTAM) 2 g in sodium chloride 0.9 % 100 mL IVPB (0 g Intravenous Stopped 09/22/20 0006)    Mobility Usually ambulatory at home; required walker this week. Currently feels unstable and too weak to walk .

## 2020-09-22 NOTE — Progress Notes (Addendum)
[facility])     This patient has been referred for hospice services in the community and was scheduled for an admission assessment visit today.   [facility] will continue to follow for any discharge planning needs and to coordinate admission onto hospice care if pt/family still desire this.    Thank you for the opportunity to participate in this patient's care.     Domenic Moras, BSN, RN [facility] Liaison [phone] (24h on call)

## 2020-09-22 NOTE — Progress Notes (Signed)
Manufacturing engineer [facility]) Hospital Liaison: RN note     Visited at bedside with pt, pt's spouse, and pt's daughter to discuss hospice vs palliative services after discharge.  At this time pt seems to be leaning toward hospice at home but is not certain she is ready to stop treatment, if any options are available.  Pt is clear that she wishes to be medically optimized this hospital stay.    Plan made to visit again tomorrow to give pt and family time to discuss options with care team at the hospital.  [facility] information and contact numbers given to  pt's spouse and daughter.       Please do not hesitate to call with questions.   Thank you for the opportunity to participate in this patient's care.  Domenic Moras, BSN, RN       [place] (listed on AMION under [phone]  [phone] (24h on call)

## 2020-09-22 NOTE — Consult Note (Signed)
Consultation Note Date: 09/22/2020   Patient Name: Gwendolyn Williams  DOB: 04-04-1940  MRN: [id number]  Age / Sex: 80 y.o., female  PCP: Gwendolyn Arabian, MD Referring Physician: Oswald Hillock, MD  Reason for Consultation: Establishing goals of care  HPI/Patient Profile: 80 y.o. 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  Patient admitted from the emergency department with increased lethargy generalized weakness shortness of breath, noted to have progressive decline in functional status markedly accelerated decline since the last 2-3 weeks.  Patient has had a worsening generalized weakness as well as diminishing oral intake.  Admitted to hospital medicine service, seen and evaluated by medical oncology Dr. Marin Williams.  Palliative consult for goals of care discussions has been requested.  Chart review notes that the patient had also been referred to a [facility] hospice services in the community and was scheduled for an admission assessment visit today 09-22-2020.  Patient appears to be a frail elderly lady appears chronically ill she is resting in bed just does not feel good admits to generalized weakness and discomfort has some scattered ecchymosis.  Husband Gwendolyn Williams who is a Software engineer is present at the bedside.  I introduced myself and palliative care as follows: Palliative medicine is specialized medical care for people living with serious illness. It focuses on providing relief from the symptoms and stress of a serious illness. The goal is to improve quality of life for both the patient and the family. Goals of care: Broad aims of medical therapy in relation to the patient's values  and preferences. Our aim is to provide medical care aimed at enabling patients to achieve the goals that matter most to them, given the circumstances of their particular medical situation and their constraints.    Goals wishes and values important to the patient and family as a unit attempted to be explored.  See additional discussions below.   HCPOA Husband, has a son and a daughter.  SUMMARY OF RECOMMENDATIONS   Ongoing CODE STATUS and goals of care discussions with patient as well as husband Gwendolyn Williams who was present at bedside. Patient has completed advance directives in the past.  Husband will review those again with the patient.  Remains full code for now.  Patient and family is however considering DNR. Continue current mode of care, continue to treat reversible factors and to continue with time-limited trial of current interventions for the weekend.  Patient and family hope to further discuss with oncology, Gwendolyn Williams is the patient's oncologist. At the time of today's initial consultation, I spent some time with the patient's husband providing a brief overview of differences between hospice and palliative.  Additionally, differences between home with hospice versus residential hospice were also briefly discussed.  Palliative to follow along.  Thank you for the consult. Code Status/Advance Care  Planning: Full code   Symptom Management:     Palliative Prophylaxis:  Delirium Protocol   Psycho-social/Spiritual:  Desire for further Chaplaincy support:yes Additional Recommendations: Education on Hospice  Prognosis:  Guarded   Discharge Planning: options being discussed include home with hospice versus residential hospice.       Primary Diagnoses: Present on Admission:  Essential hypertension  CKD (chronic kidney disease) stage 3, GFR 30-59 ml/min (HCC)  Metastatic breast cancer (HCC)  Acute respiratory failure with hypoxia ([place])   I have reviewed the medical record,  interviewed the patient and family, and examined the patient. The following aspects are pertinent.  Past Medical History:  Diagnosis Date   Arthritis    Asthma    controlled with singulair   Breast cancer ([place])    stage I left breast   Current use of long term anticoagulation 04/21/2017   Dyspnea, unspecified 02/24/2017   Started coincident with Hydrea, exertional and orthostatic, normal exam. O2 sat 100%.   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   Pneumonia    viral pneumonia as a kid   PONV (postoperative nausea and vomiting)    Social History   Socioeconomic History   Marital status: Married    Spouse name: Not on file   Number of children: Not on file   Years of education: Not on file   Highest education level: Not on file  Occupational History   Not on file  Tobacco Use   Smoking status: Former    Packs/day: 1.00    Years: 5.00    Pack years: 5.00    Types: Cigarettes    Start date: 61    Quit date: 1964    Years since quitting: 58.5   Smokeless tobacco: Never  Vaping Use   Vaping Use: Never used  Substance and Sexual Activity   Alcohol use: Yes    Alcohol/week: 0.0 standard drinks    Comment: Rare   Drug use: No   Sexual activity: Not Currently    Partners: Male    Comment: 1st intercourse 80 yo-Fewer than 5 partners  Other Topics Concern   Not on file  Social History Narrative   Not on file   Social Determinants of Health   Financial Resource Strain: Not on file  Food Insecurity: Not on file  Transportation Needs: Not on file  Physical Activity: Not on file  Stress: Not on file  Social Connections: Not on file   Family History   Problem Relation Age of Onset   Breast cancer Mother 71   Lung cancer Mother    Heart disease Father    Basal cell carcinoma Father        x2   CAD Father    Ovarian cysts Sister    Dementia Sister    Breast cancer Maternal Aunt 52   Breast cancer Paternal Aunt 75   Ovarian cysts Maternal Grandmother        possibly had ovarian cancer as well   Breast cancer Maternal Aunt 55   Cancer Other        either pancreatic or colon   Other  Daughter 65       breast lumpectomy- complex sclerosing lesion   Polycystic ovary syndrome Daughter    Bladder Cancer Cousin    Scheduled Meds:  buPROPion  150 mg Oral Daily   dexamethasone  1 mg Oral Daily   fluticasone furoate-vilanterol  1 puff Inhalation Daily   furosemide  20 mg Oral Daily   pantoprazole  80 mg Oral Q1200   potassium chloride  10 mEq Oral BID   rivaroxaban  10 mg Oral Daily   rosuvastatin  40 mg Oral Daily   Continuous Infusions:  aztreonam 1 g (09/22/20 0943)   [START ON 09/23/2020] vancomycin     PRN Meds:.acetaminophen **OR** acetaminophen, docusate sodium, LORazepam, ondansetron **OR** ondansetron (ZOFRAN) IV Medications Prior to Admission:  Prior to Admission medications   Medication Sig Start Date End Date Taking? Authorizing Provider  albuterol (VENTOLIN HFA) 108 (90 Base) MCG/ACT inhaler Inhale 1 puff into the lungs every 4 (four) hours as needed for wheezing or shortness of breath.   Yes [provider]  amLODipine (NORVASC) 2.5 MG tablet Take 2.5 mg by mouth daily.   Yes [provider]  amLODipine (NORVASC) 5 MG tablet Take 5 mg by mouth daily.   Yes [provider]  buPROPion (WELLBUTRIN XL) 150 MG 24 hr tablet Take 150 mg by mouth daily. 08/02/20  Yes [provider]  Cholecalciferol (VITAMIN D3) 50 MCG (2000 UT) CAPS Take 4,000 Units by mouth daily.   Yes [provider]  Coenzyme Q10 (CO Q-10) 100 MG CAPS Take 100 mg by mouth daily.   Yes [provider]   dexamethasone (DECADRON) 1 MG tablet Take 4 tablets (4 mg total) by mouth daily for 7 days, THEN 3 tablets (3 mg total) daily for 7 days, THEN 2 tablets (2 mg total) daily for 7 days, THEN 1 tablet (1 mg total) daily for 7 days. 08/28/20 09/25/20 Yes Brunetta Genera, MD  esomeprazole (NEXIUM) 40 MG capsule Take 40 mg by mouth daily.   Yes [provider]  fluticasone furoate-vilanterol (BREO ELLIPTA) 200-25 MCG/INH AEPB USE 1 INHALATION DAILY Patient taking differently: Inhale 1 puff into the lungs daily. 01/23/20  Yes Margaretha Seeds, MD  folic acid (FOLVITE) 1 MG tablet Take 1 mg by mouth daily.   Yes [provider]  LORazepam (ATIVAN) 0.5 MG tablet Take 1 tablet (0.5 mg total) by mouth every 8 (eight) hours. 09/20/20  Yes Orson Slick, MD  potassium chloride (KLOR-CON) 8 MEQ tablet Take 1 tablet (8 mEq total) by mouth 2 (two) times daily. 08/28/20  Yes Brunetta Genera, MD  rivaroxaban (XARELTO) 10 MG TABS tablet Take 10 mg by mouth daily.   Yes [provider]  rosuvastatin (CRESTOR) 40 MG tablet Take 1 tablet (40 mg total) by mouth daily. 11/25/19  Yes Skeet Latch, MD  docusate sodium (COLACE) 100 MG capsule Take 1 capsule (100 mg total) by mouth 2 (two) times daily. Patient not taking: Reported on 09/22/2020 10/07/16   Danae Orleans, PA-C  hyoscyamine (LEVSIN SL) 0.125 MG SL tablet Take 1 to 3 tablets three times daily as needed Patient not taking: Reported on 09/22/2020 05/07/20   Milus Banister, MD  ondansetron (ZOFRAN) 4 MG tablet Take 1 tablet (4 mg total) by mouth every 6 (six) hours as needed for nausea. Patient not taking: Reported on 09/22/2020 08/28/20   Brunetta Genera, MD  polyethylene glycol [facility] / Floria Raveling) packet Take 17 g by  mouth 2 (two) times daily. Patient not taking: Reported on 09/22/2020 10/07/16   Danae Orleans, PA-C  potassium chloride (KLOR-CON) 8 MEQ tablet Take 1 tablet (8 mEq total) by mouth daily. Patient not  taking: Reported on 09/22/2020 08/10/20   Edwin Dada, MD  Spacer/Aero-Holding Josiah Lobo DEVI 1 Device by Does not apply route as directed. 04/15/18   Margaretha Seeds, MD  hydroxyurea (HYDREA) 500 MG capsule TAKE ONE CAPSULE BY MOUTH DAILY WITH FOOD TO MINIMIZE GI SIDE EFFECTS Patient not taking: Reported on 08/25/2020 07/09/20 09/22/20  Brunetta Genera, MD   Allergies  Allergen Reactions   Atorvastatin Other (See Comments)    myalgia   Lipitor [Atorvastatin Calcium]     Weakness in legs    Penicillins Hives    Has patient had a PCN reaction causing immediate rash, facial/tongue/throat swelling, SOB or lightheadedness with hypotension: Unknown Has patient had a PCN reaction causing severe rash involving mucus membranes or skin necrosis: Unknown Has patient had a PCN reaction that required hospitalization: No Has patient had a PCN reaction occurring within the last 10 years: No If all of the above answers are "NO", then may proceed with Cephalosporin use.    Pravastatin Other (See Comments)    dizzy (2010)   Sulfa Antibiotics Other (See Comments)    unknown Other reaction(s): unknown   Tamiflu [Oseltamivir Phosphate] Nausea And Vomiting   Ciprofloxacin Rash   Review of Systems Patient complains of generalized weakness, just does not feel good. Physical Exam Appears weak and chronically ill-appearing Appears with generalized weakness and fatigue and Has a some muscle wasting evident Appears to have regular work of breathing Some swelling bilateral lower extremities noted Overall is awake and alert and answers questions appropriately. Vital Signs: BP 116/83 (BP Location: Left Arm)   Pulse (!) 113   Temp 98.3 F (36.8 C) (Oral)   Resp 20   Ht 4\' 11"  (1.499 m)   Wt 40.8 kg   SpO2 91%   BMI 18.18 kg/m  Pain Scale: 0-10   Pain Score: 0-No pain   SpO2: SpO2: 91 % O2 Device:SpO2: 91 % O2 Flow Rate: .O2 Flow Rate (L/min): 3 L/min  IO: Intake/output summary:   Intake/Output Summary (Last 24 hours) at 09/22/2020 1447 Last data filed at 09/22/2020 3570 Gross per 24 hour  Intake 423.86 ml  Output --  Net 423.86 ml    LBM: Last BM Date: 09/19/20 (pt unsure, its been a few days) Baseline Weight: Weight: 40.8 kg Most recent weight: Weight: 40.8 kg     Palliative Assessment/Data:   PPS 40%  Time In:  1330 Time Out:  1430 Time Total:  60  Greater than 50%  of this time was spent counseling and coordinating care related to the above assessment and plan.  Signed by: Loistine Chance, MD   Please contact Palliative Medicine Team phone at [phone] for questions and concerns.  For individual provider: See Shea Evans

## 2020-09-22 NOTE — Progress Notes (Addendum)
Son Gwendolyn Williams is bedside, decision to allow him to stay communicated with [facility] Gwendolyn Williams, Gwendolyn Williams understands that this is an exception to the current visiting hours and may change.

## 2020-09-22 NOTE — Progress Notes (Signed)
  Echocardiogram 2D Echocardiogram has been performed.  Gwendolyn Williams 09/22/2020, 12:42 PM

## 2020-09-22 NOTE — Progress Notes (Signed)
  Echocardiogram 2D Echocardiogram has been attempted. Physician consult in progress. Will reattempt at later time.  Sabir Charters G Khylei Wilms 09/22/2020, 9:12 AM

## 2020-09-22 NOTE — Progress Notes (Signed)
Subjective: Patient admitted this morning, see detailed H&P by Dr Alcario Drought 80 year old female with medical history of metastatic breast cancer with mets to liver and bone, on long-term Xarelto for mesenteric vein thrombosis, hypertension presented to ED with increased lethargy, generalized weakness and shortness of breath.  Patient has declined over past 3 to 4 weeks.  She follows Dr. Irene Limbo who has been out of town.  Patient had significant decline in functional status with generalized weakness as well as swelling of ankles.  She was seen by a family friend who is a physician who checked her in-house and found that she had normal lung sounds and O2 sats were 89% on room air.  She was sent to ED for further evaluation.  Vitals:   09/22/20 1114 09/22/20 1557  BP: 116/83 96/69  Pulse: (!) 113 (!) 114  Resp: 20 15  Temp: 98.3 F (36.8 C) 97.7 F (36.5 C)  SpO2: 91% 92%    On exam Chest-bibasilar crackles auscultated  A/P  Acute hypoxemic respiratory failure -Started on aztreonam and vancomycin for possible pneumonia -2D echo from 2019 shows grade 1 diastolic dysfunction -We will start low-dose Lasix 20 mg p.o. daily -Discontinue IV fluids  Bilateral lower extremity edema -Likely from hypoalbuminemia, albumin is down to 2.3; prealbumin is only 6.8 -We will obtain dietitian consult  CKD stage IIIb -Creatinine at baseline -Started on Lasix as above -Follow BMP in am  Metastatic breast cancer -Patient family stopped chemo yesterday evening, do not want to start back on that -Family agreeable for palliative care consult; will consult palliative care  Long-term Xarelto use -She has history of mesenteric vein thrombosis -Hematology recommends to restart [facility] Pager- [phone]

## 2020-09-22 NOTE — Consult Note (Signed)
Referral MD  Reason for Referral: Metastatic breast cancer; thrombocytopenia  Chief Complaint  Patient presents with   Shortness of Breath  : Very weak.  HPI: Gwendolyn Williams is a very nice 80 year old white female.  She is originally from [facility].  She was with her son.  Patient has metastatic breast cancer.  She is followed by Dr. Irene Limbo.  It sounds like she has been treated with Verzenio and Faslodex.  She also has essential thrombocytosis.  She presented with mesenteric vein thrombus.  She is on Xarelto.  Pression is she had breast cancer back in August 2010.  She.  Had a stage I disease.  She was treated with 4 cycles of Cytoxan/Taxotere and radiation.  She had Arimidex until 2019.  She has declined over the past couple days.  She is got very weak.  She had to come to the hospital last night.  Her labs showed a white cell count of 2.8.  Hemoglobin 10.5.  Platelet count 87,000.  Her albumin was 2.3.  BUN 38 creatinine 1.5.  Her LFTs were elevated.  She had a chest x-ray done which showed bilateral infiltrates.  She is on antibiotics now.  Apparently, there was some schistocytes seen on the peripheral smear.  Because of this, the hospitalist was worried about TTP.  I looked at the peripheral smear.  I saw a rare schistocytes.  She had some anisocytosis and poikilocytosis.  I think this is more so from her liver disease.  There was no nucleated red cells.  I saw no immature myeloid cells.  Her LDH was 679.  Her reticulocyte count is 1%.  Today, her white cell count 3.  Hemoglobin 9.5.  Plate count 81,191.  She just looks fatigued and quite weak.  She is lost weight.  She is not eating.  It appears that she does have a fairly extensive liver metastasis.  She does have a little bit of swelling in her legs.  Of note, her last CA  27.29 was going up quickly.  It was 3600.  A month prior it was 2100.  Of note, she also has a high chromogranin A level of 980.  She has had no pain  issues.  There is no obvious bleeding.  I would have to say that overall, her performance status is probably ECOG 3.  Past Medical History:  Diagnosis Date   Arthritis    Asthma    controlled with singulair   Breast cancer ([place])    stage I left breast   Current use of long term anticoagulation 04/21/2017   Dyspnea, unspecified 02/24/2017   Started coincident with [place], exertional and orthostatic, normal exam. O2 sat 100%.   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   Pneumonia    viral pneumonia as a kid   PONV (postoperative nausea and vomiting)   :   Past Surgical History:  Procedure Laterality Date   BREAST SURGERY     Left breast lumpectomy sentinel node biopsy   CERVICAL SPINE SURGERY  05/23/2008   COLECTOMY  04/11/2008   left colectomy with end colostomy due to clot  COLOSTOMY TAKEDOWN  08/14/2008   EYE SURGERY     bil cataracts   HERNIA REPAIR  2011   [facility]   INTRAVASCULAR PRESSURE WIRE/FFR STUDY N/A 01/25/2018   Procedure: INTRAVASCULAR PRESSURE WIRE/FFR STUDY;  Surgeon: Nelva Bush, MD;  Location: [facility];  Service: Cardiovascular;  Laterality: N/A;   LEFT HEART CATH AND CORONARY ANGIOGRAPHY N/A 01/25/2018   Procedure: LEFT HEART CATH AND CORONARY ANGIOGRAPHY;  Surgeon: Nelva Bush, MD;  Location: [facility];  Service: Cardiovascular;  Laterality: N/A;   LUMBAR DISC SURGERY  2006   TOTAL HIP ARTHROPLASTY Left 10/07/2016   Procedure: LEFT TOTAL HIP ARTHROPLASTY ANTERIOR APPROACH;  Surgeon: Paralee Cancel, MD;  Location: [facility];  Service: Orthopedics;  Laterality: Left;  70 mins  :   Current Facility-Administered Medications:     acetaminophen (TYLENOL) tablet 650 mg, 650 mg, Oral, Q6H PRN **OR** acetaminophen (TYLENOL) suppository 650 mg, 650 mg, Rectal, Q6H PRN, Alcario Drought, Jared M, DO   aztreonam (AZACTAM) 1 g in sodium chloride 0.9 % 100 mL IVPB, 1 g, Intravenous, Q8H, [place], Baltazar Najjar, RPH   buPROPion (WELLBUTRIN XL) 24 hr tablet 150 mg, 150 mg, Oral, Daily, Alcario Drought, Jared M, DO   dexamethasone (DECADRON) tablet 1 mg, 1 mg, Oral, Daily, Alcario Drought, Jared M, DO   docusate sodium (COLACE) capsule 100 mg, 100 mg, Oral, BID PRN, Alcario Drought, Jared M, DO   fluticasone furoate-vilanterol (BREO ELLIPTA) 200-25 MCG/INH 1 puff, 1 puff, Inhalation, Daily, Alcario Drought, Jared M, DO   lactated ringers infusion, , Intravenous, Continuous, Alcario Drought, Jared M, DO, Last Rate: 50 mL/hr at 09/22/20 0418, New Bag at 09/22/20 0418   [START ON 09/23/2020] LORazepam (ATIVAN) tablet 0.5 mg, 0.5 mg, Oral, QHS, [place], Jared M, DO   ondansetron (ZOFRAN) tablet 4 mg, 4 mg, Oral, Q6H PRN **OR** ondansetron (ZOFRAN) injection 4 mg, 4 mg, Intravenous, Q6H PRN, Alcario Drought, Jared M, DO   pantoprazole (PROTONIX) EC tablet 80 mg, 80 mg, Oral, Q1200, Alcario Drought, Jared M, DO   potassium chloride (KLOR-CON) CR tablet 8 mEq, 8 mEq, Oral, BID, Alcario Drought, Jared M, DO   rivaroxaban (XARELTO) tablet 20 mg, 20 mg, Oral, Daily, Daxton Nydam, Rudell Cobb, MD   rosuvastatin (CRESTOR) tablet 40 mg, 40 mg, Oral, Daily, Alcario Drought, Jared M, DO   [START ON 09/23/2020] vancomycin (VANCOREADY) IVPB 500 mg/100 mL, 500 mg, Intravenous, Q36H, Thomes Lolling, RPH:   buPROPion  150 mg Oral Daily   dexamethasone  1 mg Oral Daily   fluticasone furoate-vilanterol  1 puff Inhalation Daily   [START ON 09/23/2020] LORazepam  0.5 mg Oral QHS   pantoprazole  80 mg Oral Q1200   potassium chloride  8 mEq Oral BID   rivaroxaban  20 mg Oral Daily   rosuvastatin  40 mg Oral Daily  :   Allergies  Allergen Reactions   Atorvastatin Other (See Comments)    myalgia   Lipitor [Atorvastatin Calcium]      Weakness in legs    Penicillins Hives    Has patient had a PCN reaction causing immediate rash, facial/tongue/throat swelling, SOB or lightheadedness with hypotension: Unknown Has patient had a PCN reaction causing severe rash involving mucus membranes or skin necrosis: Unknown Has patient had a PCN reaction that required hospitalization: No Has patient had a PCN reaction occurring within the last 10 years: No If all of the above answers are "NO", then may proceed with Cephalosporin use.    Pravastatin Other (See Comments)    dizzy (2010)  Sulfa Antibiotics Other (See Comments)    unknown Other reaction(s): unknown   Tamiflu [Oseltamivir Phosphate] Nausea And Vomiting   Ciprofloxacin Rash  :   Family History  Problem Relation Age of Onset   Breast cancer Mother 58   Lung cancer Mother    Heart disease Father    Basal cell carcinoma Father        x2   CAD Father    Ovarian cysts Sister    Dementia Sister    Breast cancer Maternal Aunt 1   Breast cancer Paternal Aunt 70   Ovarian cysts Maternal Grandmother        possibly had ovarian cancer as well   Breast cancer Maternal Aunt 55   Cancer Other        either pancreatic or colon   Other Daughter 40       breast lumpectomy- complex sclerosing lesion   Polycystic ovary syndrome Daughter    Bladder Cancer Cousin   :   Social History   Socioeconomic History   Marital status: Married    Spouse name: Not on file   Number of children: Not on file   Years of education: Not on file   Highest education level: Not on file  Occupational History   Not on file  Tobacco Use   Smoking status: Former    Packs/day: 1.00    Years: 5.00    Pack years: 5.00    Types: Cigarettes    Start date: 9    Quit date: 1964    Years since quitting: 58.5   Smokeless tobacco: Never  Vaping Use   Vaping Use: Never used  Substance and Sexual Activity   Alcohol use: Yes    Alcohol/week: 0.0 standard drinks    Comment: Rare   Drug  use: No   Sexual activity: Not Currently    Partners: Male    Comment: 1st intercourse 80 yo-Fewer than 5 partners  Other Topics Concern   Not on file  Social History Narrative   Not on file   Social Determinants of Health   Financial Resource Strain: Not on file  Food Insecurity: Not on file  Transportation Needs: Not on file  Physical Activity: Not on file  Stress: Not on file  Social Connections: Not on file  Intimate Partner Violence: Not on file  :  Review of Systems  Constitutional:  Positive for malaise/fatigue and weight loss.  HENT: Negative.    Eyes: Negative.   Respiratory: Negative.    Cardiovascular: Negative.   Gastrointestinal: Negative.   Genitourinary: Negative.   Musculoskeletal: Negative.   Skin: Negative.   Neurological: Negative.   Endo/Heme/Allergies: Negative.   Psychiatric/Behavioral: Negative.      Exam:  This is a somewhat ill-appearing white female.  She is quite fatigued.  She is oriented.  Vital signs are temperature 98.8.  Pulse 109.  Blood pressure 113/75.  Oxygen saturation is 95%.  Her head neck exam shows some temporal muscle wasting.  She has no obvious oral lesions.  There is no adenopathy in the neck.  Lungs are with decent breath sounds bilaterally.  There may be little bit of wheezing.  Cardiac exam tachycardic but regular.  There are no murmurs.  Abdomen is soft.  Bowel sounds are decreased.  There is no fluid wave.  There are some tenderness in the right upper quadrant.  There may be her liver edge below the right costal margin.  Extremities show some swelling in the  lower legs.  This is about the ankle area.  No obvious venous cords are noted.  Skin exam shows some scattered ecchymoses.  Neurological exam shows no focal neurological deficits.  Patient Vitals for the past 24 hrs:  BP Temp Temp src Pulse Resp SpO2 Height Weight  09/22/20 0421 113/75 98.8 F (37.1 C) Oral (!) 109 18 95 % -- --  09/22/20 0141 124/85 98.1 F (36.7 C)  Oral (!) 104 16 93 % -- --  09/22/20 0000 110/75 -- -- 98 (!) 23 98 % -- --  09/21/20 2330 111/81 -- -- (!) 104 (!) 26 93 % -- --  09/21/20 2315 122/75 -- -- (!) 104 (!) 29 93 % -- --  09/21/20 2300 121/81 -- -- (!) 102 (!) 30 92 % -- --  09/21/20 2245 119/83 -- -- (!) 103 (!) 27 93 % -- --  09/21/20 2230 113/81 -- -- (!) 101 (!) 27 93 % -- --  09/21/20 2141 116/83 (!) 97.5 F (36.4 C) Oral (!) 103 (!) 28 94 % -- --  09/21/20 2023 104/73 97.7 F (36.5 C) Oral (!) 110 (!) 22 94 % 4\' 11"  (1.499 m) 90 lb (40.8 kg)      Recent Labs    09/21/20 2217 09/21/20 2327 09/22/20 0311  WBC 2.8*  --  3.0*  HGB 10.5*  --  9.5*  HCT 34.4*  --  31.9*  PLT 87* 82* 88*    Recent Labs    09/21/20 2217 09/22/20 0311  NA 135 134*  K 4.3 4.5  CL 109 107  CO2 19* 19*  GLUCOSE 122* 93  BUN 38* 38*  CREATININE 1.51* 1.44*  CALCIUM 8.7* 8.6*    Blood smear review: See above  Pathology: None    Assessment and Plan: Ms. Sacks is a very nice 80 year old white female.  She has metastatic breast cancer.  She is on Faslodex/Verzenio.  She also has essential thrombocythemia.  I really think that her problem is metastatic disease.  By the elevated CA 27.29, I have to believe that her disease is clearly progressing.  I suspect that she may have some element of liver dysfunction.  We will check a ammonia level on her.  Having pneumonia could certainly cause her situation.  She is on antibiotics for pneumonia.  I will check a prealbumin on her.  She does look somewhat malnourished.  I do not see anything that would suggest TTP.  I suspect that her platelets are low from the [facility].  It also could be low secondary to her extensive liver involvement by malignancy.  I think it would be okay to restart her Xarelto.  Again, I really have to suspect that her prognosis is really clearly dependent on the metastatic breast cancer.  Hopefully, she will improve with the antibiotics.  I know that she  will get outstanding care from all the staff on 6 E.  I know this is a complicated situation.  Lattie Haw, MD  Gwendolyn Williams 17:14

## 2020-09-22 NOTE — H&P (Addendum)
History and Physical    Gwendolyn Williams EQA:[id number] DOB: September 20, 1940 DOA: 09/21/2020  PCP: Gaynelle Arabian, MD  Patient coming from: Home  I have personally briefly reviewed patient's old medical records in [place]  Chief Complaint: SOB  HPI: Gwendolyn Williams is a 80 y.o. 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  Pt presents to the ED tonight with increased lethargy, generalized weakness, and SOB.  Pt progressively declined over past 3-4 weeks.  Waiting to follow up with oncologist who has been out of town during that time.  Significant decline in functional status and generalized weakness as well as edema of ankles.  Family friend who is a physician checked on pt at house today: pt with abnormal lung sounds and recd she be evaluated.  Pt did have constipation a couple of days ago but this is resolved after enema at that time in ED.  She has cough, no known fevers.  No known h/o CHF.  No sick contacts.  Poor PO intake over past few days.  Pt had been on PO chemotherapy with Verzenio but this was discontinued by family last night.   ED Course: In the ED pt has: 1) new O2 requirement of 3L 2) BLL opacities on CXR suspicious for PNA per radiologist 3) WBC 2.8k 4) HGB 10.8 (11.0 on 6/20 and 12.7 on 6/10) 5) Platelets 87 down from 126 on 6/20 and 227 on 6/10  DIC pnl showed schistocytes on smear.  D.Dimer of 16k.  INR 2.0 (though pt is on xarelto)  Fibrinogen 530.  6) AST/ALT of 95 and 89 improved from a high of 191 and 319 earlier this month. 7) Creat 1.51 looks like she usually runs between 1.1 and 1.4 BUN 38 today (was 40 x4 days ago).  BNP nl.  Pt started on empiric aztreonam + vanc for presumed BLL PNA on CXR as the cause of her new O2 requirement.  Nosebleed office visit to oncology noted on 6/10 (though platelets 227 at that  time).  Family is interested in palliative care, however pt still full code at this time.   Review of Systems: As per HPI, otherwise all review of systems negative.  Past Medical History:  Diagnosis Date   Arthritis    Asthma    controlled with singulair   Breast cancer ([place])    stage I left breast   Current use of long term anticoagulation 04/21/2017   Dyspnea, unspecified 02/24/2017   Started coincident with Hyd[place], exertional and orthostatic, normal exam. O2 sat 100%.   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   Pneumonia    viral pneumonia as a kid   PONV (postoperative nausea and vomiting)     Past Surgical History:  Procedure Laterality Date   BREAST SURGERY     Left breast lumpectomy sentinel node biopsy   CERVICAL SPINE SURGERY  05/23/2008   COLECTOMY  04/11/2008   left colectomy with end colostomy due to clot   COLOSTOMY TAKEDOWN  08/14/2008  EYE SURGERY     bil cataracts   HERNIA REPAIR  2011   [facility]   INTRAVASCULAR PRESSURE WIRE/FFR STUDY N/A 01/25/2018   Procedure: INTRAVASCULAR PRESSURE WIRE/FFR STUDY;  Surgeon: Nelva Bush, MD;  Location: [facility];  Service: Cardiovascular;  Laterality: N/A;   LEFT HEART CATH AND CORONARY ANGIOGRAPHY N/A 01/25/2018   Procedure: LEFT HEART CATH AND CORONARY ANGIOGRAPHY;  Surgeon: Nelva Bush, MD;  Location: [facility];  Service: Cardiovascular;  Laterality: N/A;   LUMBAR DISC SURGERY  2006   TOTAL HIP ARTHROPLASTY Left 10/07/2016   Procedure: LEFT TOTAL HIP ARTHROPLASTY ANTERIOR APPROACH;  Surgeon: Paralee Cancel, MD;  Location: [facility];  Service: Orthopedics;  Laterality: Left;  70 mins      reports that she quit smoking about 58 years ago. She started smoking about 63 years ago. She has a 5.00 pack-year smoking history. She has never used smokeless tobacco. She reports current alcohol use. She reports that she does not use drugs.  Allergies  Allergen Reactions   Atorvastatin     Other reaction(s): myalgia   Ciprofloxacin     Other reaction(s): rash   Lipitor [Atorvastatin Calcium]     Weakness in legs    Penicillins Hives    Has patient had a PCN reaction causing immediate rash, facial/tongue/throat swelling, SOB or lightheadedness with hypotension: Unknown Has patient had a PCN reaction causing severe rash involving mucus membranes or skin necrosis: Unknown Has patient had a PCN reaction that required hospitalization: No Has patient had a PCN reaction occurring within the last 10 years: No If all of the above answers are "NO", then may proceed with Cephalosporin use.    Pravastatin     Other reaction(s): dizzy (2010)   Sulfa Antibiotics Other (See Comments)    unknown Other reaction(s): unknown   Tamiflu [Oseltamivir Phosphate] Nausea And Vomiting    Family History  Problem Relation Age of Onset   Breast cancer Mother 63   Lung cancer Mother    Heart disease Father    Basal cell carcinoma Father        x2   CAD Father    Ovarian cysts Sister    Dementia Sister    Breast cancer Maternal Aunt 38   Breast cancer Paternal Aunt 51   Ovarian cysts Maternal Grandmother        possibly had ovarian cancer as well   Breast cancer Maternal Aunt 1   Cancer Other        either pancreatic or colon   Other Daughter 5       breast lumpectomy- complex sclerosing lesion   Polycystic ovary syndrome Daughter    Bladder Cancer Cousin      Prior to Admission medications   Medication Sig Start Date End Date Taking? Authorizing Provider  acetaminophen (TYLENOL) 500 MG tablet Take 500 mg by mouth every 6 (six) hours as needed for moderate pain.    [provider]   albuterol (VENTOLIN HFA) 108 (90 Base) MCG/ACT inhaler 1 puff as needed    [provider]  amLODipine (NORVASC) 2.5 MG tablet Take 2.5 mg by mouth daily.    [provider]  amLODipine (NORVASC) 5 MG tablet TAKE ONE TABLET BY MOUTH DAILY FOR BLOOD PRESSURE    [provider]  Biotin 10 MG TABS Take 1 tablet by mouth daily.    [provider]  buPROPion (WELLBUTRIN XL) 150 MG 24 hr tablet 1 tablet in the morning  08/02/20   [provider]  Cholecalciferol (VITAMIN D3) 50 MCG (2000 UT) CAPS 2 capsules    [provider]  Coenzyme Q10 (CO Q-10) 100 MG CAPS Take 1 capsule by mouth daily.    [provider]  dexamethasone (DECADRON) 1 MG tablet Take 4 tablets (4 mg total) by mouth daily for 7 days, THEN 3 tablets (3 mg total) daily for 7 days, THEN 2 tablets (2 mg total) daily for 7 days, THEN 1 tablet (1 mg total) daily for 7 days. 08/28/20 09/25/20  Brunetta Genera, MD  docusate sodium (COLACE) 100 MG capsule Take 1 capsule (100 mg total) by mouth 2 (two) times daily. Patient taking differently: Take 100 mg by mouth daily. 10/07/16   Danae Orleans, PA-C  esomeprazole (NEXIUM) 40 MG capsule Take 1 capsule by mouth daily.    [provider]  fluticasone furoate-vilanterol (BREO ELLIPTA) 200-25 MCG/INH AEPB USE 1 INHALATION DAILY 01/23/20   Margaretha Seeds, MD  folic acid (FOLVITE) 1 MG tablet Take 1 mg by mouth daily.    [provider]  hyoscyamine (LEVSIN SL) 0.125 MG SL tablet Take 1 to 3 tablets three times daily as needed Patient taking differently: Take 0.125 mg by mouth every 6 (six) hours as needed for cramping. 05/07/20   Milus Banister, MD  LORazepam (ATIVAN) 0.5 MG tablet Take 1 tablet (0.5 mg total) by mouth every 8 (eight) hours. 09/20/20   Orson Slick, MD  meclizine (ANTIVERT) 25 MG tablet 1 tablet as needed    [provider]  ondansetron (ZOFRAN) 4 MG tablet Take 1 tablet (4 mg total) by  mouth every 6 (six) hours as needed for nausea. 08/28/20   Brunetta Genera, MD  ondansetron (ZOFRAN-ODT) 4 MG disintegrating tablet 1 tablet on the tongue and allow to dissolve    [provider]  polyethylene glycol (MIRALAX / GLYCOLAX) packet Take 17 g by mouth 2 (two) times daily. Patient taking differently: Take 17 g by mouth daily. 10/07/16   Danae Orleans, PA-C  potassium chloride (KLOR-CON) 8 MEQ tablet Take 1 tablet (8 mEq total) by mouth daily. 08/10/20   Danford, Suann Larry, MD  potassium chloride (KLOR-CON) 8 MEQ tablet Take 1 tablet (8 mEq total) by mouth 2 (two) times daily. 08/28/20   Brunetta Genera, MD  promethazine (PHENERGAN) 25 MG tablet 1 tablet 08/06/20   [provider]  rivaroxaban (XARELTO) 10 MG TABS tablet 1 tablet with food    [provider]  rosuvastatin (CRESTOR) 40 MG tablet Take 1 tablet (40 mg total) by mouth daily. 11/25/19   Skeet Latch, MD  Spacer/Aero-Holding Josiah Lobo DEVI 1 Device by Does not apply route as directed. 04/15/18   Margaretha Seeds, MD  hydroxyurea (HYDREA) 500 MG capsule TAKE ONE CAPSULE BY MOUTH DAILY WITH FOOD TO MINIMIZE GI SIDE EFFECTS Patient not taking: Reported on 08/25/2020 07/09/20 09/22/20  Brunetta Genera, MD    Physical Exam: Vitals:   09/21/20 2315 09/21/20 2330 09/22/20 0000 09/22/20 0141  BP: 122/75 111/81 110/75 124/85  Pulse: (!) 104 (!) 104 98 (!) 104  Resp: (!) 29 (!) 26 (!) 23 16  Temp:    98.1 F (36.7 C)  TempSrc:    Oral  SpO2: 93% 93% 98% 93%  Weight:      Height:        Constitutional: NAD, calm, comfortable, somnolent but wakes to voice, frail Eyes: PERRL, lids and conjunctivae normal ENMT:  Mucous membranes are moist. Posterior pharynx clear of any exudate or lesions.Normal dentition.  Neck: normal, supple, no masses, no thyromegaly Respiratory: Crackles B bases Cardiovascular: Regular rate and rhythm, no murmurs / rubs / gallops. 1+ BLE edema in ankles, but  doesn't really go up the legs. 2+ pedal pulses. No carotid bruits.  Abdomen: no tenderness, no masses palpated. No hepatosplenomegaly. Bowel sounds positive.  Musculoskeletal: no clubbing / cyanosis. No joint deformity upper and lower extremities. Good ROM, no contractures. Normal muscle tone.  Skin: no rashes, lesions, ulcers. No induration Neurologic: CN 2-12 grossly intact. Sensation intact, DTR normal. Strength 5/5 in all 4.  Psychiatric: Normal judgment and insight. Alert and oriented x 3. Normal mood.    Labs on Admission: I have personally reviewed following labs and imaging studies  CBC: Recent Labs  Lab 09/17/20 1950 09/21/20 2217 09/21/20 2327  WBC 3.8* 2.8*  --   NEUTROABS 3.5  --   --   HGB 11.0* 10.5*  --   HCT 35.8* 34.4*  --   MCV 93.2 92.2  --   PLT 126* 87* 82*   Basic Metabolic Panel: Recent Labs  Lab 09/17/20 2150 09/21/20 2217  NA 135 135  K 3.7 4.3  CL 108 109  CO2 20* 19*  GLUCOSE 117* 122*  BUN 40* 38*  CREATININE 1.07* 1.51*  CALCIUM 8.3* 8.7*   GFR: Estimated Creatinine Clearance: 20.1 mL/min (A) (by C-G formula based on SCr of 1.51 mg/dL (H)). Liver Function Tests: Recent Labs  Lab 09/17/20 2150 09/21/20 2217  AST 92* 95*  ALT 93* 89*  ALKPHOS 222* 197*  BILITOT 0.6 0.9  PROT 4.8* 5.3*  ALBUMIN 2.3* 2.3*   Recent Labs  Lab 09/17/20 2150  LIPASE 31   No results for input(s): AMMONIA in the last 168 hours. Coagulation Profile: Recent Labs  Lab 09/21/20 2327  INR 2.0*   Cardiac Enzymes: No results for input(s): CKTOTAL, CKMB, CKMBINDEX, TROPONINI in the last 168 hours. BNP (last 3 results) No results for input(s): PROBNP in the last 8760 hours. HbA1C: No results for input(s): HGBA1C in the last 72 hours. CBG: No results for input(s): GLUCAP in the last 168 hours. Lipid Profile: No results for input(s): CHOL, HDL, LDLCALC, TRIG, CHOLHDL, LDLDIRECT in the last 72 hours. Thyroid Function Tests: No results for input(s): TSH,  T4TOTAL, FREET4, T3FREE, THYROIDAB in the last 72 hours. Anemia Panel: No results for input(s): VITAMINB12, FOLATE, FERRITIN, TIBC, IRON, RETICCTPCT in the last 72 hours. Urine analysis:    Component Value Date/Time   COLORURINE AMBER (A) 08/08/2020 1301   APPEARANCEUR CLEAR 08/08/2020 1301   LABSPEC 1.041 (H) 08/08/2020 1301   PHURINE 5.0 08/08/2020 1301   GLUCOSEU NEGATIVE 08/08/2020 1301   HGBUR SMALL (A) 08/08/2020 1301   BILIRUBINUR NEGATIVE 08/08/2020 1301   KETONESUR NEGATIVE 08/08/2020 1301   PROTEINUR 100 (A) 08/08/2020 1301   UROBILINOGEN 0.2 05/08/2008 2256   NITRITE NEGATIVE 08/08/2020 1301   LEUKOCYTESUR NEGATIVE 08/08/2020 1301    Radiological Exams on Admission: DG Chest Port 1 View  Result Date: 09/21/2020 CLINICAL DATA:  Shortness of breath EXAM: PORTABLE CHEST 1 VIEW COMPARISON:  11/23/2017 trauma CT 08/07/2020 FINDINGS: Surgical hardware in the cervical spine. Hazy airspace disease in the lower lungs concerning for pneumonia. Stable cardiomediastinal silhouette with aortic atherosclerosis. No pneumothorax IMPRESSION: New hazy mid to lower lung opacity concerning for pneumonia Electronically Signed   By: Donavan Foil M.D.   On: 09/21/2020 22:06    EKG:  Independently reviewed.  Assessment/Plan Principal Problem:   Acute respiratory failure with hypoxia (HCC) Active Problems:   Current use of long term anticoagulation   Essential hypertension   Metastatic breast cancer (HCC)   CKD (chronic kidney disease) stage 3, GFR 30-59 ml/min (HCC)    Acute resp failure with hypoxia - DDx includes PNA, side effect of Verzenio. COVID neg, flu neg New onset CHF also possible, but BNP is nl Verzenio stopped by family already Check 2d echo Empiric coverage of PNA: will cont aztreonam and vanc started by ED for the moment Cultures pending Cont pulse ox O2 via [employer] Tele monitor Holding amlodipine as this may be the cause of peripheral edema as well Thrombocytopenia  - New onset Holding Xarelto DDx: chronic DIC vs early TTP/HUS Acute DIC seems less likely with elevated fibrinogen Spoke with Dr. Marin Olp about differentiating DIC from TTP Check LDH Check retic count Save smear for him to look at Stop xarelto Onc will see in consult in AM but Nothing to do for the next couple of hours emergently tonight CKD stage 3 - Maybe slight acute component Gentle hydration with maint IVF only for the moment.  Dont really want to flood her until we r/o CHF with that 2d echo. Metastatic breast CA - Family took pt off of chemo last evening and doesn't want her back on. Family is interested in talking to pal care Will put in for [facility] care consult in AM Long term Xarelto use - Due to h/o mesenteric thrombosis back in 2010 No recent clots Holding xarelto due to thrombocytopenia.  DVT prophylaxis: SCDs Code Status: Full code for now Family Communication: Family at bedside Disposition Plan: TBD Consults called: Spoke with Dr. Marin Olp as above, consult to [facility] care put into Epic Admission status: Admit to inpatient  Severity of Illness: The appropriate patient status for this patient is INPATIENT. Inpatient status is judged to be reasonable and necessary in order to provide the required intensity of service to ensure the patient's safety. The patient's presenting symptoms, physical exam findings, and initial radiographic and laboratory data in the context of their chronic comorbidities is felt to place them at high risk for further clinical deterioration. Furthermore, it is not anticipated that the patient will be medically stable for discharge from the hospital within 2 midnights of admission. The following factors support the patient status of inpatient.   Patient has acute respiratory failure with hypoxia due to having a new oxygen requirement.  That is the patient has a PaO2 < 60 (pulse Ox < 90%) on room air.   * I certify that at the point of admission it is my  clinical judgment that the patient will require inpatient hospital care spanning beyond 2 midnights from the point of admission due to high intensity of service, high risk for further deterioration and high frequency of surveillance required.*   Chelsie Burel M. DO [facility]  How to contact the [facility] Attending or Consulting provider [place] or covering provider during after hours [place], for this patient?  Check the care team in [facility] and look for a) attending/consulting [facility] provider listed and b) the [facility] team listed Log into [email or web address]  Amion Physician Scheduling and messaging for groups and whole hospitals  On call and physician scheduling software for group practices, residents, hospitalists and other medical providers for call, clinic, rotation and shift schedules. On[employer] is a hospital-wide system for scheduling doctors and paging doctors on call. EasyPlot is for  scientific plotting and data analysis.  [email or web address]  and use [place]'s universal password to access. If you do not have the password, please contact the hospital operator.  Locate the [facility] provider you are looking for under [facility] and page to a number that you can be directly reached. If you still have difficulty reaching the provider, please page the [facility] (Director on Call) for the Hospitalists listed on amion for assistance.  09/22/2020, 2:33 AM

## 2020-09-22 NOTE — ED Notes (Signed)
Pt linens changed, repositioned in bed. Cleaned and placed new brief. Purwic applied to continuous suction. Per pt request she is left in home t-shirt for comfort.

## 2020-09-23 DIAGNOSIS — C50919 Malignant neoplasm of unspecified site of unspecified female breast: Secondary | ICD-10-CM

## 2020-09-23 DIAGNOSIS — R531 Weakness: Secondary | ICD-10-CM

## 2020-09-23 DIAGNOSIS — Z7189 Other specified counseling: Secondary | ICD-10-CM

## 2020-09-23 DIAGNOSIS — Z515 Encounter for palliative care: Secondary | ICD-10-CM

## 2020-09-23 LAB — GLUCOSE, CAPILLARY
Glucose-Capillary: 57 mg/dL — ABNORMAL LOW (ref 70–99)
Glucose-Capillary: 67 mg/dL — ABNORMAL LOW (ref 70–99)
Glucose-Capillary: 78 mg/dL (ref 70–99)
Glucose-Capillary: 95 mg/dL (ref 70–99)
Glucose-Capillary: 105 mg/dL — ABNORMAL HIGH (ref 70–99)
Glucose-Capillary: 102 mg/dL — ABNORMAL HIGH (ref 70–99)

## 2020-09-23 LAB — BASIC METABOLIC PANEL
Anion gap: 10 (ref 5–15)
BUN: 42 mg/dL — ABNORMAL HIGH (ref 8–23)
CO2: 17 mmol/L — ABNORMAL LOW (ref 22–32)
Calcium: 8.1 mg/dL — ABNORMAL LOW (ref 8.9–10.3)
Chloride: 108 mmol/L (ref 98–111)
Creatinine, Ser: 1.71 mg/dL — ABNORMAL HIGH (ref 0.44–1.00)
GFR, Estimated: 30 mL/min — ABNORMAL LOW (ref >60)
Glucose, Bld: 63 mg/dL — ABNORMAL LOW (ref 70–99)
Potassium: 3.9 mmol/L (ref 3.5–5.1)
Sodium: 135 mmol/L (ref 135–145)

## 2020-09-23 LAB — CBC
HCT: 33.7 % — ABNORMAL LOW (ref 36.0–46.0)
Hemoglobin: 10.5 g/dL — ABNORMAL LOW (ref 12.0–15.0)
MCH: 28.8 pg (ref 26.0–34.0)
MCHC: 31.2 g/dL (ref 30.0–36.0)
MCV: 92.6 fL (ref 80.0–100.0)
Platelets: 68 10*3/uL — ABNORMAL LOW (ref 150–400)
RBC: 3.64 MIL/uL — ABNORMAL LOW (ref 3.87–5.11)
RDW: 22.4 % — ABNORMAL HIGH (ref 11.5–15.5)
WBC: 2.7 10*3/uL — ABNORMAL LOW (ref 4.0–10.5)
nRBC: 0 % (ref 0.0–0.2)

## 2020-09-23 MED ORDER — SODIUM CHLORIDE 0.9 % IV SOLN
2.0000 g | Freq: Two times a day (BID) | INTRAVENOUS | Status: DC
  Administered 2020-09-23 – 2020-09-24 (×2): 2 g via INTRAVENOUS
  Filled 2020-09-23 (×2): qty 2

## 2020-09-23 MED ORDER — PROSOURCE PLUS PO LIQD
30.0000 mL | Freq: Two times a day (BID) | ORAL | Status: DC
  Administered 2020-09-23: 30 mL via ORAL
  Filled 2020-09-23: qty 30

## 2020-09-23 MED ORDER — HYDROMORPHONE HCL 1 MG/ML IJ SOLN
0.5000 mg | INTRAMUSCULAR | Status: DC | PRN
Start: 2020-09-23 — End: 2020-09-24
  Administered 2020-09-23 – 2020-09-24 (×2): 0.5 mg via INTRAVENOUS
  Filled 2020-09-23 (×2): qty 0.5

## 2020-09-23 MED ORDER — ENSURE ENLIVE PO LIQD
237.0000 mL | Freq: Two times a day (BID) | ORAL | Status: DC
  Administered 2020-09-23: 237 mL via ORAL

## 2020-09-23 MED ORDER — VANCOMYCIN HCL 500 MG/100ML IV SOLN
500.0000 mg | INTRAVENOUS | Status: DC
  Administered 2020-09-23: 500 mg via INTRAVENOUS
  Filled 2020-09-23: qty 100

## 2020-09-23 NOTE — Progress Notes (Signed)
   09/23/20 2025  Assess: MEWS Score  Temp 98.6 F (37 C)  BP 97/75  Pulse Rate (!) 104  Resp 20  SpO2 90 %  O2 Device Nasal Cannula  O2 Flow Rate (L/min) 3 L/min  Assess: MEWS Score  MEWS Temp 0  MEWS Systolic 1  MEWS Pulse 1  MEWS RR 0  MEWS LOC 0  MEWS Score 2  MEWS Score Color Yellow  Assess: if the MEWS score is Yellow or Red  Were vital signs taken at a resting state? Yes  Focused Assessment No change from prior assessment  Does the patient meet 2 or more of the SIRS criteria? No  MEWS guidelines implemented *See Row Information* No, previously yellow, continue vital signs every 4 hours  Escalate  MEWS: Escalate Yellow: discuss with charge nurse/RN and consider discussing with provider and RRT  Notify: Charge Nurse/RN  Name of Charge Nurse/RN Notified Rocco Serene RN  Date Charge Nurse/RN Notified 09/23/20  Time Charge Nurse/RN Notified 2038  Document  Progress note created (see row info) Yes  Assess: SIRS CRITERIA  SIRS Temperature  0  SIRS Pulse 1  SIRS Respirations  0  SIRS WBC 0  SIRS Score Sum  1   Pt scoring 2/yellow MEWS for the above VS. Pt has triggered yellow previously during this admission for similar abnormal VS. No acute changes to condition at this time. Continue to monitor. Hortencia Conradi RN

## 2020-09-23 NOTE — Progress Notes (Signed)
Subjective: The patient is seen and examined today.  Her husband was at the bedside.  She continues to complain of increasing fatigue and weakness as well as lack of appetite and shortness of breath and she is currently on oxygen nasal cannula.  She has no fever or chills.  She also has abdominal distention and constipation.  Objective: Vital signs in last 24 hours: Temp:  [97.7 F (36.5 C)-98.6 F (37 C)] 98.6 F (37 C) (06/26 0510) Pulse Rate:  [96-115] 102 (06/26 0510) Resp:  [15-20] 20 (06/26 0510) BP: (90-116)/(63-83) 102/76 (06/26 0510) SpO2:  [88 %-95 %] 91 % (06/26 0825)  Intake/Output from previous day: 06/25 0701 - 06/26 0700 In: 440 [P.O.:340; IV Piggyback:100] Out: 150 [Urine:150] Intake/Output this shift: No intake/output data recorded.  General appearance: alert, cooperative, appears stated age, fatigued, and mild distress Resp: clear to auscultation bilaterally Cardio: regular rate and rhythm, S1, S2 normal, no murmur, click, rub or gallop GI: abnormal findings:  distended Extremities: extremities normal, atraumatic, no cyanosis or edema  Lab Results:  Recent Labs    09/22/20 0311 09/23/20 0547  WBC 3.0* 2.7*  HGB 9.5* 10.5*  HCT 31.9* 33.7*  PLT 88* 68*   BMET Recent Labs    09/22/20 0311 09/23/20 0547  NA 134* 135  K 4.5 3.9  CL 107 108  CO2 19* 17*  GLUCOSE 93 63*  BUN 38* 42*  CREATININE 1.44* 1.71*  CALCIUM 8.6* 8.1*    Studies/Results: DG Chest Port 1 View  Result Date: 09/21/2020 CLINICAL DATA:  Shortness of breath EXAM: PORTABLE CHEST 1 VIEW COMPARISON:  11/23/2017 trauma CT 08/07/2020 FINDINGS: Surgical hardware in the cervical spine. Hazy airspace disease in the lower lungs concerning for pneumonia. Stable cardiomediastinal silhouette with aortic atherosclerosis. No pneumothorax IMPRESSION: New hazy mid to lower lung opacity concerning for pneumonia Electronically Signed   By: Donavan Foil M.D.   On: 09/21/2020 22:06   ECHOCARDIOGRAM  COMPLETE  Result Date: 09/22/2020    ECHOCARDIOGRAM REPORT   Patient Name:   Gwendolyn Williams Date of Exam: 09/22/2020 Medical Rec #:  [id number]      Height:       59.0 in Accession #:    [id number]     Weight:       90.0 lb Date of Birth:  [birth year]       BSA:          1.314 m Patient Age:    80 years       BP:           113/75 mmHg Patient Gender: F              HR:           117 bpm. 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 Risk Factors:Hypertension and Dyslipidemia.                 Cancer. GERD.  Sonographer:    Jonelle Sidle Dance Referring Phys: [place]  1. Left ventricular ejection fraction, by estimation, is 60 to 65%. The left ventricle has normal function. The left ventricle has no regional wall motion abnormalities. There is mild left ventricular  hypertrophy. Left ventricular diastolic parameters were normal.  2. Right ventricular systolic function is mildly reduced. The right ventricular size is normal. Tricuspid regurgitation signal is inadequate for assessing PA pressure.  3. The mitral valve is normal in structure. No evidence of mitral valve regurgitation.  4. The aortic valve was not well visualized. Aortic valve regurgitation is mild. Mild to moderate aortic valve stenosis. Vmax 2.6 m/s, MG 17 mmHg, AVA 1.4 cm^2, DI 0.49  5. Aortic dilatation noted. There is mild dilatation of the ascending aorta, measuring 37 mm.  6. The inferior vena cava is normal in size with greater than 50% respiratory variability, suggesting right atrial pressure of 3 mmHg. FINDINGS  Left Ventricle: Left ventricular ejection fraction, by estimation, is 60 to 65%. The left ventricle has normal function. The left ventricle has no regional wall motion abnormalities. The left  ventricular internal cavity size was normal in size. There is  mild left ventricular hypertrophy. Left ventricular diastolic parameters were normal. Right Ventricle: The right ventricular size is normal. No increase in right ventricular wall thickness. Right ventricular systolic function is mildly reduced. Tricuspid regurgitation signal is inadequate for assessing PA pressure. Left Atrium: Left atrial size was normal in size. Right Atrium: Right atrial size was normal in size. Pericardium: There is no evidence of pericardial effusion. Mitral Valve: The mitral valve is normal in structure. No evidence of mitral valve regurgitation. Tricuspid Valve: The tricuspid valve is normal in structure. Tricuspid valve regurgitation is trivial. Aortic Valve: The aortic valve was not well visualized. Aortic valve regurgitation is mild. Aortic regurgitation PHT measures 196 msec. Mild to moderate aortic stenosis is present. Pulmonic Valve: The pulmonic valve was not well visualized. Pulmonic valve regurgitation is not visualized. Aorta: The aortic root is normal in size and structure and aortic dilatation noted. There is mild dilatation of the ascending aorta, measuring 37 mm. Venous: The inferior vena cava is normal in size with greater than 50% respiratory variability, suggesting right atrial pressure of 3 mmHg. IAS/Shunts: The interatrial septum was not well visualized.  LEFT VENTRICLE PLAX 2D LVIDd:         3.80 cm  Diastology LVIDs:         2.60 cm  LV e' medial:    9.14 cm/s LV PW:         1.00 cm  LV E/e' medial:  9.4 LV IVS:        0.90 cm  LV e' lateral:   8.16 cm/s LVOT diam:     1.90 cm  LV E/e' lateral: 10.6 LV SV:         47 LV SV Index:   36 LVOT Area:     2.84 cm  RIGHT VENTRICLE             IVC RV Basal diam:  1.90 cm     IVC diam: 1.30 cm RV S prime:     13.50 cm/s TAPSE (M-mode): 0.8 cm LEFT ATRIUM             Index       RIGHT ATRIUM          Index LA diam:        3.20 cm 2.44 cm/m  RA Area:     5.47 cm LA  Vol (A2C):   22.9 ml 17.43 ml/m RA Volume:   5.99 ml  4.56 ml/m LA Vol (A4C):   33.5 ml 25.50 ml/m LA Biplane Vol: 28.6 ml 21.77 ml/m  AORTIC  VALVE LVOT Vmax:   126.00 cm/s LVOT Vmean:  86.300 cm/s LVOT VTI:    0.166 m AI PHT:      196 msec  AORTA Ao Root diam: 3.40 cm Ao Asc diam:  3.70 cm MITRAL VALVE MV Area (PHT): 4.60 cm     SHUNTS MV Decel Time: 165 msec     Systemic VTI:  0.17 m MV E velocity: 86.30 cm/s   Systemic Diam: 1.90 cm MV A velocity: 127.00 cm/s MV E/A ratio:  0.68 Oswaldo Milian MD Electronically signed by Oswaldo Milian MD Signature Date/Time: 09/22/2020/1:20:11 PM    Final     Medications: I have reviewed the patient's current medications.  CODE STATUS: 1-2  Assessment/Plan: This is a very pleasant 80 years old white female with metastatic breast adenocarcinoma and currently on treatment with Faslodex and Verzenio under the care of Dr. Irene Limbo.  The patient and her husband mentioned that she had decline in her condition over the last few weeks after starting treatment with Verzenio. She is currently in the hospital for suspicious lower lobe pneumonia and being treated with Azactam and vancomycin.  The patient has no fever overnight. I would recommend for her to continue her current treatment with antibiotics for now. Regarding her metastatic breast cancer, the patient will discuss with Dr. Irene Limbo her future treatment options when he comes back to the office tomorrow. For the renal insufficiency, this could be secondary to toxicity from vancomycin as well as the sepsis.  We will continue with IV hydration and close monitoring. Thank you for allowing me to participate in the care of Gwendolyn Williams.  Dr. Irene Limbo will resume her care starting tomorrow.  Please call if you have any questions.   LOS: 2 days    Eilleen Kempf 09/23/2020

## 2020-09-23 NOTE — Progress Notes (Signed)
[facility]  PROGRESS NOTE  Gwendolyn Williams OZH:[id number] DOB: 1940-09-24 DOA: 09/21/2020 PCP: Gaynelle Arabian, MD   Brief HPI:   80 year old female with medical history of metastatic breast cancer with mets to liver and bone, on long-term Xarelto for mesenteric vein thrombosis, hypertension presented to ED with increased lethargy, generalized weakness and shortness of breath.  Patient has declined over past 3 to 4 weeks.  She follows Dr. Irene Limbo who has been out of town.  Patient had significant decline in functional status with generalized weakness as well as swelling of ankles.  She was seen by a family friend who is a physician who checked her in-house and found that she had normal lung sounds and O2 sats were 89% on room air.  She was sent to ED for further evaluation.      Subjective   Patient seen and examined, still continues to have shortness of breath.  She was started on Lasix 20 mg p.o. daily yesterday, however very minimal urine output.  Blood pressure is soft.  She is now requiring 4 L/min of oxygen.  CBGs have been running low.   Assessment/Plan:     Acute hypoxemic respiratory failure -Multifactorial from possible community-acquired pneumonia -Also concern for diastolic dysfunction -She has bibasilar crackles, requiring 4 L/min of oxygen -Continue vancomycin, aztreonam -Was started on Lasix 20 mg p.o. daily; will discontinue Lasix as patient has developed worsening renal function  Acute kidney injury on CKD stage IIIb -Creatinine 1.71 today -We will discontinue p.o. Lasix -Cannot give IV fluids as patient has bibasilar crackles, concern for volume overload from diastolic heart failure.  She is hypotensive so Lasix did not help much with diuresis.  Bilateral lower extremity edema -Echocardiogram obtained yesterday showed EF of 60 to 65% -Albumin is low at 2.3 -Prealbumin is 6.8 -Lasix was started however not good diuretic response due to low blood  pressure  Metastatic breast cancer -Patient was on chemotherapy however family stopped that before coming to hospital -Palliative care has been consulted -Ongoing discussion regarding hospice, CODE STATUS  Mesenteric vein thrombosis -Continue Xarelto  Thrombocytopenia -As per oncology this is due to [facility] -Also could be due to extensive liver involvement by malignancy     Scheduled medications:    buPROPion  150 mg Oral Daily   dexamethasone  1 mg Oral Daily   fluticasone furoate-vilanterol  1 puff Inhalation Daily   pantoprazole  80 mg Oral Q1200   rivaroxaban  10 mg Oral Daily         Data Reviewed:   CBG:  Recent Labs  Lab 09/23/20 0703 09/23/20 0727 09/23/20 0757 09/23/20 1201  GLUCAP 57* 67* 78 95    SpO2: 91 % O2 Flow Rate (L/min): 4 L/min    Vitals:   09/22/20 2043 09/22/20 2332 09/23/20 0510 09/23/20 0825  BP:  95/72 102/76   Pulse:  96 (!) 102   Resp:  20 20   Temp:  97.8 F (36.6 C) 98.6 F (37 C)   TempSrc:  Oral Oral   SpO2: 93% 92% (!) 89% 91%  Weight:      Height:         Intake/Output Summary (Last 24 hours) at 09/23/2020 1402 Last data filed at 09/22/2020 1815 Gross per 24 hour  Intake 120 ml  Output 150 ml  Net -30 ml    06/24 1901 - 06/26 0700 In: 863.9 [P.O.:440; I.V.:123.9] Out: 150 [Urine:150]  Filed Weights   09/21/20 2023  Weight: 40.8 kg  CBC:  Recent Labs  Lab 09/17/20 1950 09/21/20 2217 09/21/20 2327 09/22/20 0311 09/23/20 0547  WBC 3.8* 2.8*  --  3.0* 2.7*  HGB 11.0* 10.5*  --  9.5* 10.5*  HCT 35.8* 34.4*  --  31.9* 33.7*  PLT 126* 87* 82* 88* 68*  MCV 93.2 92.2  --  94.9 92.6  MCH 28.6 28.2  --  28.3 28.8  MCHC 30.7 30.5  --  29.8* 31.2  RDW 23.0* 22.6*  --  23.2* 22.4*  LYMPHSABS 0.2*  --   --   --   --   MONOABS 0.0*  --   --   --   --   EOSABS 0.0  --   --   --   --   BASOSABS 0.0  --   --   --   --     Complete metabolic panel:  Recent Labs  Lab 09/17/20 2150 09/21/20 2217  09/21/20 2327 09/22/20 0311 09/22/20 1015 09/23/20 0547  NA 135 135  --  134*  --  135  K 3.7 4.3  --  4.5  --  3.9  CL 108 109  --  107  --  108  CO2 20* 19*  --  19*  --  17*  GLUCOSE 117* 122*  --  93  --  63*  BUN 40* 38*  --  38*  --  42*  CREATININE 1.07* 1.51*  --  1.44*  --  1.71*  CALCIUM 8.3* 8.7*  --  8.6*  --  8.1*  AST 92* 95*  --   --   --   --   ALT 93* 89*  --   --   --   --   ALKPHOS 222* 197*  --   --   --   --   BILITOT 0.6 0.9  --   --   --   --   ALBUMIN 2.3* 2.3*  --   --   --   --   DDIMER  --   --  16.54*  --   --   --   PROCALCITON  --   --  0.45  --   --   --   LATICACIDVEN  --  2.0*  --   --   --   --   INR  --   --  2.0*  --   --   --   AMMONIA  --   --   --   --  25  --   BNP  --   --  76.0  --   --   --     Recent Labs  Lab 09/17/20 2150  LIPASE 31    Recent Labs  Lab 09/21/20 2327  DDIMER 16.54*  BNP 76.0  PROCALCITON 0.45  SARSCOV2NAA NEGATIVE    ------------------------------------------------------------------------------------------------------------------ No results for input(s): CHOL, HDL, LDLCALC, TRIG, CHOLHDL, LDLDIRECT in the last 72 hours.  No results found for: HGBA1C ------------------------------------------------------------------------------------------------------------------ No results for input(s): TSH, T4TOTAL, T3FREE, THYROIDAB in the last 72 hours.  Invalid input(s): FREET3 ------------------------------------------------------------------------------------------------------------------ Recent Labs    09/22/20 0311  RETICCTPCT 1.3    Coagulation profile Recent Labs  Lab 09/21/20 2327  INR 2.0*   Recent Labs    09/21/20 2327  DDIMER 16.54*    Cardiac Enzymes No results for input(s): CKTOTAL, CKMB, CKMBINDEX, TROPONINI in the last 168 hours.  ------------------------------------------------------------------------------------------------------------------    Component Value Date/Time   BNP 76.0  09/21/2020 2327  Antibiotics: Anti-infectives (From admission, onward)    Start     Dose/Rate Route Frequency Ordered Stop   09/23/20 2200  vancomycin (VANCOREADY) IVPB 500 mg/100 mL        500 mg 100 mL/hr over 60 Minutes Intravenous Every 48 hours 09/23/20 1059     09/23/20 1800  aztreonam (AZACTAM) 2 g in sodium chloride 0.9 % 100 mL IVPB        2 g 200 mL/hr over 30 Minutes Intravenous Every 12 hours 09/23/20 1102     09/23/20 1200  vancomycin (VANCOREADY) IVPB 500 mg/100 mL  Status:  Discontinued        500 mg 100 mL/hr over 60 Minutes Intravenous Every 36 hours 09/21/20 2344 09/23/20 1059   09/22/20 0800  aztreonam (AZACTAM) 1 g in sodium chloride 0.9 % 100 mL IVPB  Status:  Discontinued        1 g 200 mL/hr over 30 Minutes Intravenous Every 8 hours 09/21/20 2344 09/23/20 1102   09/21/20 2230  aztreonam (AZACTAM) 2 g in sodium chloride 0.9 % 100 mL IVPB        2 g 200 mL/hr over 30 Minutes Intravenous  Once 09/21/20 2221 09/22/20 0006   09/21/20 2230  vancomycin (VANCOCIN) IVPB 1000 mg/200 mL premix        1,000 mg 200 mL/hr over 60 Minutes Intravenous  Once 09/21/20 2229 09/22/20 0112        Radiology Reports  DG Chest Port 1 View  Result Date: 09/21/2020 CLINICAL DATA:  Shortness of breath EXAM: PORTABLE CHEST 1 VIEW COMPARISON:  11/23/2017 trauma CT 08/07/2020 FINDINGS: Surgical hardware in the cervical spine. Hazy airspace disease in the lower lungs concerning for pneumonia. Stable cardiomediastinal silhouette with aortic atherosclerosis. No pneumothorax IMPRESSION: New hazy mid to lower lung opacity concerning for pneumonia Electronically Signed   By: Donavan Foil M.D.   On: 09/21/2020 22:06   ECHOCARDIOGRAM COMPLETE  Result Date: 09/22/2020    ECHOCARDIOGRAM REPORT   Patient Name:   KASIAH MANKA Date of Exam: 09/22/2020 Medical Rec #:  [id number]      Height:       59.0 in Accession #:    [id number]     Weight:       90.0 lb Date of Birth:  [birth year]       BSA:           1.314 m Patient Age:    80 years       BP:           113/75 mmHg Patient Gender: F              HR:           117 bpm. 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 Risk Factors:Hypertension and Dyslipidemia.                 Cancer. GERD.  Sonographer:    Jonelle Sidle Dance Referring Phys: [place]  1. Left ventricular ejection fraction, by estimation, is 60 to 65%. The left ventricle has normal function. The left ventricle has no regional wall motion abnormalities. There is mild left ventricular hypertrophy. Left ventricular  diastolic parameters were normal.  2. Right ventricular systolic function is mildly reduced. The right ventricular size is normal. Tricuspid regurgitation signal is inadequate for assessing PA pressure.  3. The mitral valve is normal in structure. No evidence of mitral valve regurgitation.  4. The aortic valve was not well visualized. Aortic valve regurgitation is mild. Mild to moderate aortic valve stenosis. Vmax 2.6 m/s, MG 17 mmHg, AVA 1.4 cm^2, DI 0.49  5. Aortic dilatation noted. There is mild dilatation of the ascending aorta, measuring 37 mm.  6. The inferior vena cava is normal in size with greater than 50% respiratory variability, suggesting right atrial pressure of 3 mmHg. FINDINGS  Left Ventricle: Left ventricular ejection fraction, by estimation, is 60 to 65%. The left ventricle has normal function. The left ventricle has no regional wall motion abnormalities. The left ventricular internal cavity size was normal in size. There is  mild left ventricular hypertrophy. Left ventricular diastolic parameters were normal. Right Ventricle: The right ventricular size is normal. No increase in right ventricular wall thickness. Right ventricular  systolic function is mildly reduced. Tricuspid regurgitation signal is inadequate for assessing PA pressure. Left Atrium: Left atrial size was normal in size. Right Atrium: Right atrial size was normal in size. Pericardium: There is no evidence of pericardial effusion. Mitral Valve: The mitral valve is normal in structure. No evidence of mitral valve regurgitation. Tricuspid Valve: The tricuspid valve is normal in structure. Tricuspid valve regurgitation is trivial. Aortic Valve: The aortic valve was not well visualized. Aortic valve regurgitation is mild. Aortic regurgitation PHT measures 196 msec. Mild to moderate aortic stenosis is present. Pulmonic Valve: The pulmonic valve was not well visualized. Pulmonic valve regurgitation is not visualized. Aorta: The aortic root is normal in size and structure and aortic dilatation noted. There is mild dilatation of the ascending aorta, measuring 37 mm. Venous: The inferior vena cava is normal in size with greater than 50% respiratory variability, suggesting right atrial pressure of 3 mmHg. IAS/Shunts: The interatrial septum was not well visualized.  LEFT VENTRICLE PLAX 2D LVIDd:         3.80 cm  Diastology LVIDs:         2.60 cm  LV e' medial:    9.14 cm/s LV PW:         1.00 cm  LV E/e' medial:  9.4 LV IVS:        0.90 cm  LV e' lateral:   8.16 cm/s LVOT diam:     1.90 cm  LV E/e' lateral: 10.6 LV SV:         47 LV SV Index:   36 LVOT Area:     2.84 cm  RIGHT VENTRICLE             IVC RV Basal diam:  1.90 cm     IVC diam: 1.30 cm RV S prime:     13.50 cm/s TAPSE (M-mode): 0.8 cm LEFT ATRIUM             Index       RIGHT ATRIUM          Index LA diam:        3.20 cm 2.44 cm/m  RA Area:     5.47 cm LA Vol (A2C):   22.9 ml 17.43 ml/m RA Volume:   5.99 ml  4.56 ml/m LA Vol (A4C):   33.5 ml 25.50 ml/m LA Biplane Vol: 28.6 ml 21.77 ml/m  AORTIC VALVE LVOT Vmax:  126.00 cm/s LVOT Vmean:  86.300 cm/s LVOT VTI:    0.166 m AI PHT:      196 msec  AORTA Ao Root diam: 3.40 cm  Ao Asc diam:  3.70 cm MITRAL VALVE MV Area (PHT): 4.60 cm     SHUNTS MV Decel Time: 165 msec     Systemic VTI:  0.17 m MV E velocity: 86.30 cm/s   Systemic Diam: 1.90 cm MV A velocity: 127.00 cm/s MV E/A ratio:  0.68 Oswaldo Milian MD Electronically signed by Oswaldo Milian MD Signature Date/Time: 09/22/2020/1:20:11 PM    Final       DVT prophylaxis: Xarelto  Code Status: Full code  Family Communication: Discussed with patient's husband at bedside   Consultants: Oncology Palliative care  Procedures:     Objective    Physical Examination:  General-appears in no acute distress Heart-S1-S2, regular, no murmur auscultated Lungs-bibasilar crackles auscultated Abdomen-soft, nontender, no organomegaly Extremities -bilateral 2+ edema in the lower extremities Neuro-alert, oriented x3, no focal deficit noted   Status is: Inpatient  Dispo: The patient is from: Home              Anticipated d/c is to: Home              Anticipated d/c date is: 09/25/2020              Patient currently not stable for discharge  Barrier to discharge-ongoing treatment for acute hypoxemic respiratory failure  COVID-19 Labs  Recent Labs    09/21/20 2327 09/22/20 0311  DDIMER 16.54*  --   LDH  --  679*    Lab Results  Component Value Date   [place] NEGATIVE 09/21/2020   [place] NEGATIVE 08/08/2020    Microbiology  Recent Results (from the past 240 hour(s))  Culture, blood (routine x 2)     Status: None (Preliminary result)   Collection Time: 09/21/20 11:27 PM   Specimen: BLOOD  Result Value Ref Range Status   Specimen Description   Final    BLOOD SITE NOT SPECIFIED Performed at [facility], [address], [place], [place] [address]    Special Requests   Final    BOTTLES DRAWN AEROBIC AND ANAEROBIC Blood Culture adequate volume Performed at [place] [address] [place], [place] [address]    Culture   Final    NO GROWTH 1  DAY Performed at [facility], [place] [address], [place], [place] [address]    Report Status PENDING  Incomplete  Culture, blood (routine x 2)     Status: None (Preliminary result)   Collection Time: 09/21/20 11:27 PM   Specimen: BLOOD  Result Value Ref Range Status   Specimen Description   Final    BLOOD SITE NOT SPECIFIED Performed at [place] [address], [place], [place] [address]    Special Requests   Final    BOTTLES DRAWN AEROBIC AND ANAEROBIC Blood Culture adequate volume Performed at [place] [address], [place], [place] [address]    Culture   Final    NO GROWTH 1 DAY Performed at [facility], [place] [address] [place],  [address]    Report Status PENDING  Incomplete  Resp Panel by RT-PCR (Flu A&B, Covid) Nasopharyngeal Swab     Status: None   Collection Time: 09/21/20 11:27 PM   Specimen: Nasopharyngeal Swab; Nasopharyngeal(NP) swabs in vial transport medium  Result Value Ref Range  Status   SARS Coronavirus 2 by RT PCR NEGATIVE NEGATIVE Final    Comment: (NOTE) SARS-CoV-2 target nucleic acids are NOT DETECTED.  The SARS-CoV-2 RNA is generally detectable in upper respiratory specimens during the acute phase of infection. The lowest concentration of SARS-CoV-2 viral copies this assay can detect is 138 copies/mL. A negative result does not preclude SARS-Cov-2 infection and should not be used as the sole basis for treatment or other patient management decisions. A negative result may occur with  improper specimen collection/handling, submission of specimen other than nasopharyngeal swab, presence of viral mutation(s) within the areas targeted by this assay, and inadequate number of viral copies(<138 copies/mL). A negative result must be combined with clinical observations, patient history, and epidemiological information. The expected result is Negative.  Fact Sheet for Patients:   EntrepreneurPulse.com.au  Fact Sheet for Healthcare Providers:  IncredibleEmployment.be  This test is no t yet approved or cleared by the [place] FDA and  has been authorized for detection and/or diagnosis of SARS-CoV-2 by FDA under an Emergency Use Authorization (EUA). This EUA will remain  in effect (meaning this test can be used) for the duration of the COVID-19 declaration under Section 564(b)(1) of the Act, 21 U.S.C.section 360bbb-3(b)(1), unless the authorization is terminated  or revoked sooner.       Influenza A by PCR NEGATIVE NEGATIVE Final   Influenza B by PCR NEGATIVE NEGATIVE Final    Comment: (NOTE) The Xpert Xpress SARS-CoV-2/FLU/RSV plus assay is intended as an aid in the diagnosis of influenza from Nasopharyngeal swab specimens and should not be used as a sole basis for treatment. Nasal washings and aspirates are unacceptable for Xpert Xpress SARS-CoV-2/FLU/RSV testing.  Fact Sheet for Patients: EntrepreneurPulse.com.au  Fact Sheet for Healthcare Providers: IncredibleEmployment.be  This test is not yet approved or cleared by the [place] FDA and has been authorized for detection and/or diagnosis of SARS-CoV-2 by FDA under an Emergency Use Authorization (EUA). This EUA will remain in effect (meaning this test can be used) for the duration of the COVID-19 declaration under Section 564(b)(1) of the Act, 21 U.S.C. section 360bbb-3(b)(1), unless the authorization is terminated or revoked.  Performed at [facility], [place] [address], [place], [place] [address]              Oswald Hillock   [facility] If 7PM-7AM, please contact night-coverage at [email or web address], Office  [phone]   09/23/2020, 2:02 PM  LOS: 2 days

## 2020-09-23 NOTE — Progress Notes (Signed)
Pharmacy Antibiotic Note  Gwendolyn Williams is a 80 y.o. female admitted on 09/21/2020 with pneumonia.  Pharmacy has been consulted for Vancomycin & Aztreonam dosing.  Noted PCN allergy- hives.  She has no documented history of using cephalosporins in [facility].  Scr elevated from baseline.   Plan: Aztreonam 2g IV q12h Vancomycin 500mg  IV q48h to target AUC 400-550 Check Vancomycin levels at steady state Check MRSA PCR and consider d/c Vancomycin if negative Monitor renal function and cx data    Height: 4\' 11"  (149.9 cm) Weight: 40.8 kg (90 lb) IBW/kg (Calculated) : 43.2  Temp (24hrs), Avg:98 F (36.7 C), Min:97.7 F (36.5 C), Max:98.6 F (37 C)  Recent Labs  Lab 09/17/20 1950 09/17/20 2150 09/21/20 2217 09/22/20 0311 09/23/20 0547  WBC 3.8*  --  2.8* 3.0* 2.7*  CREATININE  --  1.07* 1.51* 1.44* 1.71*  LATICACIDVEN  --   --  2.0*  --   --      Estimated Creatinine Clearance: 17.7 mL/min (A) (by C-G formula based on SCr of 1.71 mg/dL (H)).    Allergies  Allergen Reactions   Atorvastatin Other (See Comments)    myalgia   Lipitor [Atorvastatin Calcium]     Weakness in legs    Penicillins Hives    Has patient had a PCN reaction causing immediate rash, facial/tongue/throat swelling, SOB or lightheadedness with hypotension: Unknown Has patient had a PCN reaction causing severe rash involving mucus membranes or skin necrosis: Unknown Has patient had a PCN reaction that required hospitalization: No Has patient had a PCN reaction occurring within the last 10 years: No If all of the above answers are "NO", then may proceed with Cephalosporin use.    Pravastatin Other (See Comments)    dizzy (2010)   Sulfa Antibiotics Other (See Comments)    unknown Other reaction(s): unknown   Tamiflu [Oseltamivir Phosphate] Nausea And Vomiting   Ciprofloxacin Rash    Antimicrobials this admission: 6/25 Aztreonam >>  6/25 Vancomycin >>   Dose adjustments this admission: 6/26 Vanc and  Aztreonam empiric renal dose adjustment.   Microbiology results: 6/25 BCx: ordered 6/25 UCx: ordered 6/25 MRSA PCR: ordered  Thank you for allowing pharmacy to be a part of this patient's care.  Gretta Arab PharmD, BCPS Clinical Pharmacist [facility] main pharmacy [phone] 09/23/2020 11:01 AM

## 2020-09-23 NOTE — Progress Notes (Signed)
Initial Nutrition Assessment  DOCUMENTATION CODES:   Underweight  INTERVENTION:  Provide Ensure Enlive po BID, each supplement provides 350 kcal and 20 grams of protein.  Provide 30 ml Prosource plus po BID, each supplement provides 100 kcal and 15 grams of protein.   Encourage adequate PO intake.   NUTRITION DIAGNOSIS:   Increased nutrient needs related to cancer and cancer related treatments as evidenced by estimated needs.  GOAL:   Patient will meet greater than or equal to 90% of their needs  MONITOR:   PO intake, Supplement acceptance, Skin, Weight trends, Labs, I & O's  REASON FOR ASSESSMENT:   Consult Assessment of nutrition requirement/status  ASSESSMENT:   80 year old female with medical history of metastatic breast cancer with mets to liver and bone, on long-term Xarelto for mesenteric vein thrombosis, hypertension presents with increased lethargy, generalized weakness and shortness of breath.  Pt unavailable during attempted time of contact. RD unable to obtain pt nutrition history at this time. Per MD note, pt with decline over the past 3-4 weeks. Pt with acute hypoxemic respiratory failure from possible CAP. Per MD, pt with concern for volume overload from diastolic heart failure. Chemotherapy stopped per family report. Palliative care consulted for ongoing goals of care discussion. RD to order nutritional supplements to aid in caloric and protein needs. Unable to complete Nutrition-Focused physical exam at this time.   Labs and medications reviewed.  Diet Order:   Diet Order             Diet regular Room service appropriate? Yes; Fluid consistency: Thin  Diet effective now                   EDUCATION NEEDS:   Not appropriate for education at this time  Skin:  Skin Assessment: Reviewed RN Assessment  Last BM:  6/22  Height:   Ht Readings from Last 1 Encounters:  09/21/20 4\' 11"  (1.499 m)    Weight:   Wt Readings from Last 1 Encounters:   09/21/20 40.8 kg    Ideal Body Weight:  45 kg  BMI:  Body mass index is 18.18 kg/m.  Estimated Nutritional Needs:   Kcal:  1450-1650  Protein:  65-80 grams  Fluid:  >/= 1.5 L/day  Corrin Parker, MS, RD, LDN RD pager number/after hours weekend pager number on Amion.

## 2020-09-23 NOTE — Progress Notes (Signed)
Hypoglycemic Event  Upon checking AM labs I noticed her serum glucose was 63 which prompted to get a CBG  CBG: 57 at 0703  Treatment: 4 oz juice/soda  Symptoms: None  Follow-up CBG: Time: 0727 CBG Result:67  Treatment: 4 oz juice/soda  Day RN made aware of hypoglycemia event in hand-off report   Follow-up CBG: 78  Possible Reasons for Event: Inadequate meal intake and Unknown  Comments/MD notified:[name] via secure chat    [place]

## 2020-09-23 NOTE — Progress Notes (Signed)
Daily Progress Note   Patient Name: Gwendolyn Williams       Date: 09/23/2020 DOB: 1940-08-12  Age: 80 y.o. MRN#: [id number] Attending Physician: Oswald Hillock, MD Primary Care Physician: Gaynelle Arabian, MD Admit Date: 09/21/2020  Reason for Consultation/Follow-up: Establishing goals of care  Subjective: Patient continues to appear with fatigue and weakness as well as having escalating symptom burden.  She is having cough however she is not able to bring up any sputum.  She does appear to have mild respiratory distress, using accessory muscles.  Length of Stay: 2  Current Medications: Scheduled Meds:   buPROPion  150 mg Oral Daily   dexamethasone  1 mg Oral Daily   fluticasone furoate-vilanterol  1 puff Inhalation Daily   pantoprazole  80 mg Oral Q1200   rivaroxaban  10 mg Oral Daily    Continuous Infusions:  aztreonam     vancomycin      PRN Meds: acetaminophen **OR** acetaminophen, docusate sodium, LORazepam, ondansetron **OR** ondansetron (ZOFRAN) IV  Physical Exam         Appears fatigued and in mild respiratory distress Trying to cough up Appears to have tachypnea Appears with generalized weakness  Vital Signs: BP 102/76 (BP Location: Left Arm)   Pulse (!) 102   Temp 98.6 F (37 C) (Oral)   Resp 20   Ht 4\' 11"  (1.499 m)   Wt 40.8 kg   SpO2 91%   BMI 18.18 kg/m  SpO2: SpO2: 91 % O2 Device: O2 Device: Nasal Cannula O2 Flow Rate: O2 Flow Rate (L/min): 4 L/min  Intake/output summary:  Intake/Output Summary (Last 24 hours) at 09/23/2020 1325 Last data filed at 09/22/2020 1815 Gross per 24 hour  Intake 120 ml  Output 150 ml  Net -30 ml   LBM: Last BM Date: 09/19/20 Baseline Weight: Weight: 40.8 kg Most recent weight: Weight: 40.8 kg      PPS 40% Palliative  Assessment/Data:      Patient Active Problem List   Diagnosis Date Noted   Palliative care by specialist    Goals of care, counseling/discussion    General weakness    CKD (chronic kidney disease) stage 3, GFR 30-59 ml/min (HCC) 09/21/2020   Acute respiratory failure with hypoxia ([place]) 09/21/2020   Metastatic breast cancer ([place]) 08/20/2020   Bone metastases (  [place]) 08/20/2020   Metastatic carcinoma involving liver with unknown primary site [facility])    Protein-calorie malnutrition, severe 08/09/2020   Hypokalemia 08/08/2020   Essential hypertension 04/19/2019   Bronchiectasis without complication ([place]) 16/12/9602   Mild reactive airways disease 02/17/2018   Abnormal cardiac CT angiography    Genetic testing 06/01/2017   Current use of long term anticoagulation 04/21/2017   Family history of breast cancer    Dyspnea on minimal exertion 02/24/2017   S/P left THA, 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   Hematoma of arm 08/09/2012   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 Irene Limbo at the cancer center with markedly progressive functional decline in the past few weeks. Admitted to hospital medicine service for for possibly pneumonia, being treated with broad-spectrum antibiotics. Palliative services following for goals of care discussions  Recommendations/Plan: Ongoing CODE STATUS and goals of care discussions with the patient, husband and son who was also present at the bedside.  Reviewed again about hospice philosophy of care.  Discussed about differences between home with hospice and residential hospice.  Discussed about appropriateness of establishing DO NOT RESUSCITATE/DO NOT INTUBATE.  Patient's husband [name] states that he is having  ongoing discussion with both his son and his daughter about the patient's ongoing decline. We reviewed the MOST form.  We discussed about choices on the MOST form.  [name] will discuss with his son and daughter and complete the document.   Code Status:    Code Status Orders  (From admission, onward)           Start     Ordered   09/21/20 2321  Full code  Continuous        09/21/20 2323           Code Status History     Date Active Date Inactive Code Status Order ID Comments User Context   08/08/2020 1555 08/10/2020 1619 Full Code [id number]  Jonnie Finner, DO Inpatient   01/25/2018 1430 01/25/2018 2012 Full Code [id number]  Nelva Bush, MD Inpatient   10/07/2016 1532 10/09/2016 1606 Full Code [id number]  Norman Herrlich Inpatient      Advance Directive Documentation    Flowsheet Row Most Recent Value  Type of Advance Directive Healthcare Power of Attorney  Pre-existing out of facility DNR order (yellow form or pink MOST form) --  "MOST" Form in Place? --       Prognosis:  Guarded   Discharge Planning: To Be Determined Either home with hospice or residential hospice.  Care plan was discussed with  patient. Son Shanon Brow and husband [name]. Additional discussions with [name] outside the room as well.   Thank you for allowing the Palliative Medicine Team to assist in the care of this patient.   Time In: 11 Time Out: 11.35 Total Time  35 Prolonged Time Billed  no       Greater than 50%  of this time was spent counseling and coordinating care related to the above assessment and plan.  Loistine Chance, MD  Please contact Palliative Medicine Team phone at [phone] for questions and concerns.

## 2020-09-23 NOTE — Progress Notes (Signed)
Manufacturing engineer [facility]) Hospital Liaison: RN note     Visited at bedside with pt, pt's spouse, and pt's daughter and son to discuss hospice at home vs impatient hospice at [facility].  Family is leaning toward comfort care but unwilling to discuss with pt at this time.  Family hoping to speak with Dr. Irene Limbo tomorrow.   Family had concerns about pt's SOB.  WOB noted to be greater than yesterday, with accessory muscle use noted and pt c/o dyspnea.  Dr. Rowe Pavy made aware.   Plan made to visit again tomorrow to give pt and family time to discuss options with care team at the hospital.  [facility] information and contact numbers given to  pt's spouse and daughter.       Please do not hesitate to call with questions.   Thank you for the opportunity to participate in this patient's care.  Domenic Moras, BSN, RN       [place] (listed on [facility] under [phone]  [phone] (24h on call)

## 2020-09-24 ENCOUNTER — Inpatient Hospital Stay: Payer: Medicare Other | Admitting: Hematology and Oncology

## 2020-09-24 ENCOUNTER — Inpatient Hospital Stay: Payer: Medicare Other

## 2020-09-24 ENCOUNTER — Other Ambulatory Visit: Payer: Self-pay | Admitting: Hematology and Oncology

## 2020-09-24 DIAGNOSIS — C50919 Malignant neoplasm of unspecified site of unspecified female breast: Secondary | ICD-10-CM

## 2020-09-24 LAB — GLUCOSE, CAPILLARY
Glucose-Capillary: 66 mg/dL — ABNORMAL LOW (ref 70–99)
Glucose-Capillary: 86 mg/dL (ref 70–99)

## 2020-09-24 MED ORDER — HYDROMORPHONE HCL 1 MG/ML IJ SOLN
0.5000 mg | INTRAMUSCULAR | Status: DC | PRN
  Administered 2020-09-24: 0.5 mg via INTRAVENOUS
  Filled 2020-09-24: qty 0.5

## 2020-09-24 MED ORDER — LORAZEPAM 2 MG/ML IJ SOLN
0.5000 mg | INTRAMUSCULAR | Status: DC | PRN
  Administered 2020-09-24: 0.5 mg via INTRAVENOUS
  Filled 2020-09-24: qty 1

## 2020-09-24 MED ORDER — ACETAMINOPHEN 325 MG PO TABS: 650.0000 mg | ORAL_TABLET | Freq: Four times a day (QID) | ORAL | Status: AC | PRN

## 2020-09-24 NOTE — Progress Notes (Signed)
Manufacturing engineer [facility]) Hospital Liaison note.    Chart reviewed and eligibility confirmed for [facility]. Spoke with family to confirm interest and explain services. Family agreeable to transfer today. TOC aware.    [facility] will notify TOC when registration paperwork has been completed to arrange transport.   RN please call report to [phone].  Thank you for the opportunity to participate in this patient's care.  Domenic Moras, BSN, RN [facility] Liaison (listed on [place] under [facility]ice[facility])    [phone] (24h on call)

## 2020-09-24 NOTE — TOC Transition Note (Signed)
Transition of Care [facility]) - CM/SW Discharge Note   Patient Details  Name: ZURIEL YEAMAN MRN: [id number] Date of Birth: May 27, 1940  Transition of Care [facility]) CM/SW Contact:  Lynnell Catalan, RN Phone Number: 09/24/2020, 3:00 PM   Clinical Narrative:    Pt to dc to [facility] today. Yellow DNR on chart for transport. PTAR to transport pt to [facility]. RN to call report to [phone].   Final next level of care: [place] Barriers to Discharge: No Barriers Identified  Discharge Placement                Patient to be transferred to facility by: PTAR      Readmission Risk Interventions No flowsheet data found.

## 2020-09-24 NOTE — Progress Notes (Signed)
Spoke to patient's husband and daughter, they have decided to make patient DNR. Will change CODE STATUS to DNR.

## 2020-09-24 NOTE — Progress Notes (Addendum)
Report called to [facility]. Discharge instructions placed in packet and sent with PTAR. Emotional support given to patient's family. All questions answered. Pt discharged via PTAR at 1925

## 2020-09-24 NOTE — Progress Notes (Signed)
Manufacturing engineer [facility]) Hospital Liaison note.     Received request from [employer] for family interest in [facility]. Chart and pt information under review by [facility] physician.  Hospice eligibility pending at this time.  [facility] liaison will continue to update family and hospital team.   Thank you for the opportunity to participate in this patient's care.   Domenic Moras, BSN, RN [facility] Liaison (listed on [place] under [facility])    [phone] (24h on call)

## 2020-09-24 NOTE — Progress Notes (Signed)
Daily Progress Note   Patient Name: Gwendolyn Williams       Date: 09/24/2020 DOB: Sep 25, 1940  Age: 80 y.o. MRN#: [id number] Attending Physician: Oswald Hillock, MD Primary Care Physician: Gaynelle Arabian, MD Admit Date: 09/21/2020  Reason for Consultation/Follow-up: Establishing goals of care  Subjective: Patient appears weak and deconditioned.  She still has mild to moderate generalized distress, still with mild to moderate degree of respiratory discomfort.  Patient not verbalizing.  Has recently been given IV Dilaudid.  Daughter and husband present at bedside.  Discussed with daughter outside the room, subsequently also discussed with husband and daughter inside the room.  We reviewed about patient's current condition, we discussed about scope of comfort measures as well as explored option for residential hospice, see below.  Length of Stay: 3  Current Medications: Scheduled Meds:   (feeding supplement) PROSource Plus  30 mL Oral BID BM   buPROPion  150 mg Oral Daily   dexamethasone  1 mg Oral Daily   feeding supplement  237 mL Oral BID BM   fluticasone furoate-vilanterol  1 puff Inhalation Daily    Continuous Infusions:   PRN Meds: acetaminophen **OR** acetaminophen, docusate sodium, HYDROmorphone (DILAUDID) injection, LORazepam, LORazepam, ondansetron **OR** ondansetron (ZOFRAN) IV  Physical Exam         Frail elderly lady resting in bed Shallow regular breath sounds Using accessory muscles Mild degree of distress Extremities warm no coolness no mottling  Vital Signs: BP 111/76 (BP Location: Left Arm)   Pulse (!) 102   Temp 98.6 F (37 C) (Oral)   Resp 16   Ht 4\' 11"  (1.499 m)   Wt 40.8 kg   SpO2 92%   BMI 18.18 kg/m  SpO2: SpO2: 92 % O2 Device: O2 Device: Nasal  Cannula O2 Flow Rate: O2 Flow Rate (L/min): 2 L/min  Intake/output summary:  Intake/Output Summary (Last 24 hours) at 09/24/2020 1210 Last data filed at 09/24/2020 0606 Gross per 24 hour  Intake 760 ml  Output 600 ml  Net 160 ml   LBM: Last BM Date: 09/19/20 Baseline Weight: Weight: 40.8 kg Most recent weight: Weight: 40.8 kg       Palliative Assessment/Data:      Palliative performance scale 20%.  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 [facility])    Protein-calorie malnutrition, severe 08/09/2020   Hypokalemia 08/08/2020   Essential hypertension 04/19/2019   Bronchiectasis without complication ([place]) 62/69/4854   Mild reactive airways disease 02/17/2018   Abnormal cardiac CT angiography    Genetic testing 06/01/2017   Current use of long term anticoagulation 04/21/2017   Family history of breast cancer    Dyspnea on minimal exertion 02/24/2017   S/P left THA, 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   Hematoma of arm 08/09/2012   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 Irene Limbo at the cancer center with markedly progressive functional decline in the past few weeks. Admitted to hospital medicine service for for possibly pneumonia, being treated with broad-spectrum antibiotics. Palliative services following for goals of care  discussions  Recommendations/Plan: Goals of care discussions: We discussed about switching to full scope of comfort measures.  We discussed that this includes discontinuation of medications no longer important for comfort and continuation of medicines that will provide relief from suffering.  Discussed about judicious use of opioids and benzodiazepines.  Pros and cons of transfer to residential hospice versus continuing comfort care in the hospital discussed.  Awaiting bed availability for [facility], if bed is available, family accepting of transfer to [facility] for continuation of aggressive symptom management for end-of-life care.  Prognosis likely less than 2 weeks at this point in my opinion.  Goals of Care and Additional Recommendations: Limitations on Scope of Treatment: Full Comfort Care  Code Status:    Code Status Orders  (From admission, onward)           Start     Ordered   09/24/20 0921  Do not attempt resuscitation (DNR)  Continuous       Question Answer Comment  In the event of cardiac or respiratory ARREST Do not call a "code blue"   In the event of cardiac or respiratory ARREST Do not perform Intubation, CPR, defibrillation or ACLS   In the event of cardiac or respiratory ARREST Use medication by any route, position, wound care, and other measures to relive pain and suffering. May use oxygen, suction and manual treatment of airway obstruction as needed for comfort.      09/24/20 0920           Code Status History     Date Active Date Inactive Code Status Order ID Comments User Context   09/21/2020 2323 09/24/2020 0920 Full Code [id number]  Etta Quill, DO ED   08/08/2020 1555 08/10/2020 1619 Full Code [id number]  Jonnie Finner, DO Inpatient   01/25/2018 1430 01/25/2018 2012 Full Code [id number]  Nelva Bush, MD Inpatient   10/07/2016 1532 10/09/2016 1606 Full Code [id number]  Norman Herrlich Inpatient      Advance Directive Documentation     Flowsheet Row Most Recent Value  Type of Advance Directive Healthcare Power of Attorney  Pre-existing out of facility  DNR order (yellow form or pink MOST form) --  "MOST" Form in Place? --       Prognosis:  < 2 weeks  Discharge Planning: Transfer to residential hospice or anticipated Hospital Death  Care plan was discussed with patient's husband and daughter, RN, TOC.   Thank you for allowing the Palliative Medicine Team to assist in the care of this patient.   Time In: 11 Time Out: 11.35 Total Time 35 Prolonged Time Billed  no       Greater than 50%  of this time was spent counseling and coordinating care related to the above assessment and plan.  Loistine Chance, MD  Please contact Palliative Medicine Team phone at [phone] for questions and concerns.

## 2020-09-24 NOTE — Discharge Summary (Signed)
Physician Discharge Summary  Gwendolyn Williams VEH:[id number] DOB: Jun 02, 1940 DOA: 09/21/2020  PCP: Gwendolyn Arabian, MD  Admit date: 09/21/2020 Discharge date: 09/24/2020  Time spent: 60 minutes  Recommendations for Outpatient Follow-up:  Patient to be discharged to residential hospice  Discharge Diagnoses:  Principal Problem:   Acute respiratory failure with hypoxia (Gwendolyn Williams) Comfort measures only Active Problems:   Current use of long term anticoagulation   Essential hypertension   Metastatic breast cancer (Gwendolyn Williams)   CKD (chronic kidney disease) stage 3, GFR 30-59 ml/min (HCC)   Palliative care by specialist   Goals of care, counseling/discussion   General weakness   Discharge Condition: Stable  Diet recommendation: Comfort diet  Filed Weights   09/21/20 2023  Weight: 40.8 kg    History of present illness:  80 year old female with medical history of metastatic breast cancer with mets to liver and bone, on long-term Xarelto for mesenteric vein thrombosis, hypertension presented to ED with increased lethargy, generalized weakness and shortness of breath.  Patient has declined over past 3 to 4 weeks.  She follows Gwendolyn Williams who has been out of town.  Patient had significant decline in functional status with generalized weakness as well as swelling of ankles.  She was seen by a family friend who is a physician who checked her in-house and found that she had normal lung sounds and O2 sats were 89% on room air.  She was sent to ED for further evaluation.    Hospital Course:   Acute hypoxemic respiratory failure -Multifactorial from possible community-acquired pneumonia -Also concern for diastolic dysfunction -She has bibasilar crackles, requiring 4 L/min of oxygen -Continue vancomycin, aztreonam -Was started on Lasix 20 mg p.o. daily; will discontinue Lasix as patient has developed worsening renal function -Patient is now comfort measures only   Acute kidney injury on CKD stage  IIIb -Creatinine 1.71 today -We will discontinue p.o. Lasix -Cannot give IV fluids as patient has bibasilar crackles, concern for volume overload from diastolic heart failure.  She is hypotensive so Lasix did not help much with diuresis. -Patient is now comfort measures only   Bilateral lower extremity edema -Echocardiogram obtained yesterday showed EF of 60 to 65% -Albumin is low at 2.3 -Prealbumin is 6.8 -Lasix was started however not good diuretic response due to low blood pressure   Metastatic breast cancer -Patient was on chemotherapy however family stopped that before coming to hospital -Palliative care has been consulted -Family has opted for residential hospice   Mesenteric vein thrombosis -Continue Xarelto   Thrombocytopenia -As per oncology this is due to Gwendolyn Williams -Also could be due to extensive liver involvement by malignancy    Procedures:   Consultations: Oncology Palliative care  Discharge Exam: Vitals:   09/24/20 0426 09/24/20 0813  BP: 111/76   Pulse: (!) 102   Resp: 16   Temp:    SpO2: 91% 92%    General: Appears in no acute distress Cardiovascular: S1-S2, regular Respiratory: Decreased breath sounds bilaterally  Discharge Instructions   Discharge Instructions     Diet - low sodium heart healthy   Complete by: As directed    Increase activity slowly   Complete by: As directed       Allergies as of 09/24/2020       Reactions   Atorvastatin Other (See Comments)   myalgia   Lipitor [atorvastatin Calcium]    Weakness in legs    Penicillins Hives   Has patient had a PCN reaction causing immediate rash, facial/tongue/throat swelling,  SOB or lightheadedness with hypotension: Unknown Has patient had a PCN reaction causing severe rash involving mucus membranes or skin necrosis: Unknown Has patient had a PCN reaction that required hospitalization: No Has patient had a PCN reaction occurring within the last 10 years: No If all of the above  answers are "NO", then may proceed with Cephalosporin use.   Pravastatin Other (See Comments)   dizzy (2010)   Sulfa Antibiotics Other (See Comments)   unknown Other reaction(s): unknown   Tamiflu [oseltamivir Phosphate] Nausea And Vomiting   Ciprofloxacin Rash        Medication List     STOP taking these medications    albuterol 108 (90 Base) MCG/ACT inhaler Commonly known as: VENTOLIN HFA   amLODipine 2.5 MG tablet Commonly known as: NORVASC   amLODipine 5 MG tablet Commonly known as: NORVASC   Breo Ellipta 200-25 MCG/INH Aepb Generic drug: fluticasone furoate-vilanterol   buPROPion 150 MG 24 hr tablet Commonly known as: WELLBUTRIN XL   Co Q-10 100 MG Caps   dexamethasone 1 MG tablet Commonly known as: DECADRON   docusate sodium 100 MG capsule Commonly known as: Colace   esomeprazole 40 MG capsule Commonly known as: NEXIUM   folic acid 1 MG tablet Commonly known as: FOLVITE   hyoscyamine 0.125 MG SL tablet Commonly known as: LEVSIN SL   LORazepam 0.5 MG tablet Commonly known as: ATIVAN   ondansetron 4 MG tablet Commonly known as: ZOFRAN   polyethylene glycol 17 g packet Commonly known as: MIRALAX / GLYCOLAX   potassium chloride 8 MEQ tablet Commonly known as: KLOR-CON   rivaroxaban 10 MG Tabs tablet Commonly known as: XARELTO   rosuvastatin 40 MG tablet Commonly known as: CRESTOR   Spacer/Aero-Holding Chambers Devi   vitamin D3 50 MCG (2000 UT) Caps       TAKE these medications    acetaminophen 325 MG tablet Commonly known as: TYLENOL Take 2 tablets (650 mg total) by mouth every 6 (six) hours as needed for mild pain (or Fever >/= 101).       Allergies  Allergen Reactions   Atorvastatin Other (See Comments)    myalgia   Lipitor [Atorvastatin Calcium]     Weakness in legs    Penicillins Hives    Has patient had a PCN reaction causing immediate rash, facial/tongue/throat swelling, SOB or lightheadedness with hypotension:  Unknown Has patient had a PCN reaction causing severe rash involving mucus membranes or skin necrosis: Unknown Has patient had a PCN reaction that required hospitalization: No Has patient had a PCN reaction occurring within the last 10 years: No If all of the above answers are "NO", then may proceed with Cephalosporin use.    Pravastatin Other (See Comments)    dizzy (2010)   Sulfa Antibiotics Other (See Comments)    unknown Other reaction(s): unknown   Tamiflu [Oseltamivir Phosphate] Nausea And Vomiting   Ciprofloxacin Rash      The results of significant diagnostics from this hospitalization (including imaging, microbiology, ancillary and laboratory) are listed below for reference.    Significant Diagnostic Studies: CT Abdomen Pelvis Wo Contrast  Result Date: 09/17/2020 CLINICAL DATA:  Abdominal distension. History of metastatic cancer on active chemotherapy. Decreased p.o. intake in general fatigue. EXAM: CT ABDOMEN AND PELVIS WITHOUT CONTRAST TECHNIQUE: Multidetector CT imaging of the abdomen and pelvis was performed following the standard protocol without IV contrast. COMPARISON:  PET CT 08/14/2020, contrast enhanced CT 08/07/2020 FINDINGS: Lower chest: Trace right pleural effusion. Bibasilar linear  atelectasis. No pulmonary mass or confluent consolidation. Hepatobiliary: Diffuse hepatic metastatic disease, with coalesce in lesion in the central liver. Comparison to prior exam is difficult given differences in modality and current lack of IV contrast, however using same measurement technique largest lesions centrally measures 10.2 x 6.7 cm, previously 10.5 x 5.9 cm. There is capsular retraction. Trace perihepatic ascites. Gallstones noted. Partially distended gallbladder. No biliary dilatation. Pancreas: No ductal dilatation or inflammation. Spleen: Normal in size without focal abnormality. There is trace perisplenic fluid has increased. Adrenals/Urinary Tract: No adrenal nodules.  Bilateral renal parenchymal thinning without hydronephrosis. Tiny subcentimeter hyperdense lesion in the posterior lower left kidney. Unremarkable urinary bladder. Possible urethral diverticulum. Stomach/Bowel: There is a large volume of stool throughout the colon. Enteric sutures are noted at the splenic flexure. Right colonic diverticulosis without diverticulitis. The rectum is prominently distended with stool, rectal distention of 8.5 cm. There is mild posterior perirectal thickening and perirectal edema. Fecalization of distal small bowel contents. No small bowel obstruction. Appendix not visualized, no appendicitis. Tiny hiatal hernia with essentially decompressed stomach. There is equivocal wall thickening about the pre pyloric stomach, series 2, image 29. Vascular/Lymphatic: Moderate aortic and bi-iliac atherosclerosis. No aortic aneurysm. No portal venous or mesenteric gas. 8 mm left periaortic node, series 2, image 30, previously 9 mm. Previous hypermetabolic right inguinal node is not definitively seen. Evidence of new or progressive adenopathy, allowing for limitations related to lack of IV contrast. Reproductive: The uterus is elevated due to rectal distention with stool, atrophic. No adnexal mass is seen. Other: Small volume perihepatic and perisplenic ascites. No free air or focal fluid collection. Generalized paucity of body fat. Musculoskeletal: Left hip arthroplasty. Small lucent lesions within the sacrum and L4. Additional areas of hypermetabolism in the skeleton on prior PET have no definite CT correlate. There is no pathologic fracture. IMPRESSION: 1. Large volume of stool throughout the colon consistent with constipation. The rectum is prominently distended with stool, rectal distention of 8.5 cm with mild perirectal edema and thickening, suspicious for fecal impaction. 2. Diffuse hepatic metastatic disease, grossly similar to prior exam allowing for differences in modality and current lack of  IV contrast. 3. Slight decreased size of left periaortic node that was hypermetabolic on prior PET. Previous hypermetabolic right inguinal node is no longer seen. Known skeletal metastasis with small lucency within the sacrum and L4. Additional skeletal metastasis on prior PET have no CT correlate. 4. Equivocal wall thickening about the pre pyloric stomach, can be seen with gastritis. 5. Cholelithiasis. 6. Colonic diverticulosis without diverticulitis. 7. Trace right pleural effusion. Aortic Atherosclerosis (ICD10-I70.0). Electronically Signed   By: Keith Rake M.D.   On: 09/17/2020 21:40   DG Chest Port 1 View  Result Date: 09/21/2020 CLINICAL DATA:  Shortness of breath EXAM: PORTABLE CHEST 1 VIEW COMPARISON:  11/23/2017 trauma CT 08/07/2020 FINDINGS: Surgical hardware in the cervical spine. Hazy airspace disease in the lower lungs concerning for pneumonia. Stable cardiomediastinal silhouette with aortic atherosclerosis. No pneumothorax IMPRESSION: New hazy mid to lower lung opacity concerning for pneumonia Electronically Signed   By: Donavan Foil M.D.   On: 09/21/2020 22:06   ECHOCARDIOGRAM COMPLETE  Result Date: 09/22/2020    ECHOCARDIOGRAM REPORT   Patient Name:   ZYANNA LEISINGER Date of Exam: 09/22/2020 Medical Rec #:  [id number]      Height:       59.0 in Accession #:    [id number]     Weight:  90.0 lb Date of Birth:  1940/06/21       BSA:          1.314 m Patient Age:    81 years       BP:           113/75 mmHg Patient Gender: F              HR:           117 bpm. 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 Risk Factors:Hypertension and Dyslipidemia.                 Cancer. GERD.  Sonographer:    Jonelle Sidle Dance Referring Phys: [place]  1. Left  ventricular ejection fraction, by estimation, is 60 to 65%. The left ventricle has normal function. The left ventricle has no regional wall motion abnormalities. There is mild left ventricular hypertrophy. Left ventricular diastolic parameters were normal.  2. Right ventricular systolic function is mildly reduced. The right ventricular size is normal. Tricuspid regurgitation signal is inadequate for assessing PA pressure.  3. The mitral valve is normal in structure. No evidence of mitral valve regurgitation.  4. The aortic valve was not well visualized. Aortic valve regurgitation is mild. Mild to moderate aortic valve stenosis. Vmax 2.6 m/s, MG 17 mmHg, AVA 1.4 cm^2, DI 0.49  5. Aortic dilatation noted. There is mild dilatation of the ascending aorta, measuring 37 mm.  6. The inferior vena cava is normal in size with greater than 50% respiratory variability, suggesting right atrial pressure of 3 mmHg. FINDINGS  Left Ventricle: Left ventricular ejection fraction, by estimation, is 60 to 65%. The left ventricle has normal function. The left ventricle has no regional wall motion abnormalities. The left ventricular internal cavity size was normal in size. There is  mild left ventricular hypertrophy. Left ventricular diastolic parameters were normal. Right Ventricle: The right ventricular size is normal. No increase in right ventricular wall thickness. Right ventricular systolic function is mildly reduced. Tricuspid regurgitation signal is inadequate for assessing PA pressure. Left Atrium: Left atrial size was normal in size. Right Atrium: Right atrial size was normal in size. Pericardium: There is no evidence of pericardial effusion. Mitral Valve: The mitral valve is normal in structure. No evidence of mitral valve regurgitation. Tricuspid Valve: The tricuspid valve is normal in structure. Tricuspid valve regurgitation is trivial. Aortic Valve: The aortic valve was not well visualized. Aortic valve regurgitation is  mild. Aortic regurgitation PHT measures 196 msec. Mild to moderate aortic stenosis is present. Pulmonic Valve: The pulmonic valve was not well visualized. Pulmonic valve regurgitation is not visualized. Aorta: The aortic root is normal in size and structure and aortic dilatation noted. There is mild dilatation of the ascending aorta, measuring 37 mm. Venous: The inferior vena cava is normal in size with greater than 50% respiratory variability, suggesting right atrial pressure of 3 mmHg. IAS/Shunts: The interatrial septum was not well visualized.  LEFT VENTRICLE PLAX 2D LVIDd:         3.80 cm  Diastology LVIDs:         2.60 cm  LV e' medial:    9.14 cm/s LV PW:  1.00 cm  LV E/e' medial:  9.4 LV IVS:        0.90 cm  LV e' lateral:   8.16 cm/s LVOT diam:     1.90 cm  LV E/e' lateral: 10.6 LV SV:         47 LV SV Index:   36 LVOT Area:     2.84 cm  RIGHT VENTRICLE             IVC RV Basal diam:  1.90 cm     IVC diam: 1.30 cm RV S prime:     13.50 cm/s TAPSE (M-mode): 0.8 cm LEFT ATRIUM             Index       RIGHT ATRIUM          Index LA diam:        3.20 cm 2.44 cm/m  RA Area:     5.47 cm LA Vol (A2C):   22.9 ml 17.43 ml/m RA Volume:   5.99 ml  4.56 ml/m LA Vol (A4C):   33.5 ml 25.50 ml/m LA Biplane Vol: 28.6 ml 21.77 ml/m  AORTIC VALVE LVOT Vmax:   126.00 cm/s LVOT Vmean:  86.300 cm/s LVOT VTI:    0.166 m AI PHT:      196 msec  AORTA Ao Root diam: 3.40 cm Ao Asc diam:  3.70 cm MITRAL VALVE MV Area (PHT): 4.60 cm     SHUNTS MV Decel Time: 165 msec     Systemic VTI:  0.17 m MV E velocity: 86.30 cm/s   Systemic Diam: 1.90 cm MV A velocity: 127.00 cm/s MV E/A ratio:  0.68 Oswaldo Milian MD Electronically signed by Oswaldo Milian MD Signature Date/Time: 09/22/2020/1:20:11 PM    Final     Microbiology: Recent Results (from the past 240 hour(s))  Culture, blood (routine x 2)     Status: None (Preliminary result)   Collection Time: 09/21/20 11:27 PM   Specimen: BLOOD  Result Value Ref Range  Status   Specimen Description   Final    BLOOD SITE NOT SPECIFIED Performed at [facility], [address], [place], [place] [address]    Special Requests   Final    BOTTLES DRAWN AEROBIC AND ANAEROBIC Blood Culture adequate volume Performed at [facility] [address] [place], [place] [address]    Culture   Final    NO GROWTH 2 DAYS Performed at [facility] [address] [place], [place] [address]    Report Status PENDING  Incomplete  Culture, blood (routine x 2)     Status: None (Preliminary result)   Collection Time: 09/21/20 11:27 PM   Specimen: BLOOD  Result Value Ref Range Status   Specimen Description   Final    BLOOD SITE NOT SPECIFIED Performed at [facility] [address], [place], [place] [address]    Special Requests   Final    BOTTLES DRAWN AEROBIC AND ANAEROBIC Blood Culture adequate volume Performed at [facility] [address] [place],  [address]    Culture   Final    NO GROWTH 2 DAYS Performed at [facility] [address], [place],  [address]    Report Status PENDING  Incomplete  Resp Panel by RT-PCR (Flu A&B, Covid) Nasopharyngeal Swab     Status: None   Collection Time: 09/21/20 11:27 PM   Specimen: Nasopharyngeal Swab; Nasopharyngeal(NP) swabs in  vial transport medium  Result Value Ref Range Status   SARS Coronavirus 2 by RT PCR NEGATIVE NEGATIVE Final    Comment: (NOTE) SARS-CoV-2 target nucleic acids are NOT DETECTED.  The SARS-CoV-2 RNA is generally detectable in upper respiratory specimens during the acute phase of infection. The lowest concentration of SARS-CoV-2 viral copies this assay can detect is 138 copies/mL. A negative result does not preclude SARS-Cov-2 infection and should not be used as the sole basis for treatment or other patient management decisions. A negative result may occur with  improper specimen collection/handling, submission of  specimen other than nasopharyngeal swab, presence of viral mutation(s) within the areas targeted by this assay, and inadequate number of viral copies(<138 copies/mL). A negative result must be combined with clinical observations, patient history, and epidemiological information. The expected result is Negative.  Fact Sheet for Patients:  EntrepreneurPulse.com.au  Fact Sheet for Healthcare Providers:  IncredibleEmployment.be  This test is no t yet approved or cleared by the [place] FDA and  has been authorized for detection and/or diagnosis of SARS-CoV-2 by FDA under an Emergency Use Authorization (EUA). This EUA will remain  in effect (meaning this test can be used) for the duration of the COVID-19 declaration under Section 564(b)(1) of the Act, 21 U.S.C.section 360bbb-3(b)(1), unless the authorization is terminated  or revoked sooner.       Influenza A by PCR NEGATIVE NEGATIVE Final   Influenza B by PCR NEGATIVE NEGATIVE Final    Comment: (NOTE) The Xpert Xpress SARS-CoV-2/FLU/RSV plus assay is intended as an aid in the diagnosis of influenza from Nasopharyngeal swab specimens and should not be used as a sole basis for treatment. Nasal washings and aspirates are unacceptable for Xpert Xpress SARS-CoV-2/FLU/RSV testing.  Fact Sheet for Patients: EntrepreneurPulse.com.au  Fact Sheet for Healthcare Providers: IncredibleEmployment.be  This test is not yet approved or cleared by the [place] FDA and has been authorized for detection and/or diagnosis of SARS-CoV-2 by FDA under an Emergency Use Authorization (EUA). This EUA will remain in effect (meaning this test can be used) for the duration of the COVID-19 declaration under Section 564(b)(1) of the Act, 21 U.S.C. section 360bbb-3(b)(1), unless the authorization is terminated or revoked.  Performed at [facility], [place]  [address], [place], [place] [address]      Labs: Basic Metabolic Panel: Recent Labs  Lab 09/17/20 2150 09/21/20 2217 09/22/20 0311 09/23/20 0547  NA 135 135 134* 135  K 3.7 4.3 4.5 3.9  CL 108 109 107 108  CO2 20* 19* 19* 17*  GLUCOSE 117* 122* 93 63*  BUN 40* 38* 38* 42*  CREATININE 1.07* 1.51* 1.44* 1.71*  CALCIUM 8.3* 8.7* 8.6* 8.1*   Liver Function Tests: Recent Labs  Lab 09/17/20 2150 09/21/20 2217  AST 92* 95*  ALT 93* 89*  ALKPHOS 222* 197*  BILITOT 0.6 0.9  PROT 4.8* 5.3*  ALBUMIN 2.3* 2.3*   Recent Labs  Lab 09/17/20 2150  LIPASE 31   Recent Labs  Lab 09/22/20 1015  AMMONIA 25   CBC: Recent Labs  Lab 09/17/20 1950 09/21/20 2217 09/21/20 2327 09/22/20 0311 09/23/20 0547  WBC 3.8* 2.8*  --  3.0* 2.7*  NEUTROABS 3.5  --   --   --   --   HGB 11.0* 10.5*  --  9.5* 10.5*  HCT 35.8* 34.4*  --  31.9* 33.7*  MCV 93.2 92.2  --  94.9 92.6  PLT 126* 87* 82* 88* 68*   Cardiac  Enzymes: No results for input(s): CKTOTAL, CKMB, CKMBINDEX, TROPONINI in the last 168 hours. BNP: BNP (last 3 results) Recent Labs    08/08/20 1411 09/21/20 2327  BNP 67.3 76.0    ProBNP (last 3 results) No results for input(s): PROBNP in the last 8760 hours.  CBG: Recent Labs  Lab 09/23/20 1201 09/23/20 1610 09/23/20 2018 09/23/20 2359 09/24/20 0422  GLUCAP 95 105* 102* 86 66*       Signed:  Oswald Hillock MD.  [facility] 09/24/2020, 2:49 PM

## 2020-09-25 ENCOUNTER — Inpatient Hospital Stay: Payer: Medicare Other

## 2020-09-25 NOTE — Progress Notes (Signed)
Brief Oncology Consult Note  Today I spoke with the patient and her family regarding steps moving forward.  She has had marked clinical decline since our last clinic visit about 2 weeks ago.  She is having a rising creatinine and elevations in LDH.  This is consistent with her major body systems beginning the shutdown.  She also has a pneumonia which has been progressively worsening.  At this time I do believe that hospice care is appropriate and transfer to [facility] is reasonable.  The family voiced their understanding and had opportunity to ask any questions or concerns.  Our hope is that she will be transferred to [facility].  Ledell Peoples, MD Department of Hematology/Oncology [place] [facility] Phone: [phone] Pager: [phone] Email: [email or web address]

## 2020-09-27 LAB — CULTURE, BLOOD (ROUTINE X 2)
Culture: NO GROWTH
Culture: NO GROWTH
Special Requests: ADEQUATE
Special Requests: ADEQUATE

## 2020-09-28 ENCOUNTER — Inpatient Hospital Stay: Payer: Medicare Other

## 2020-09-28 DEATH — deceased

## 2020-10-03 ENCOUNTER — Ambulatory Visit: Payer: Medicare Other

## 2020-10-03 ENCOUNTER — Ambulatory Visit: Payer: Medicare Other | Admitting: Hematology

## 2020-10-03 ENCOUNTER — Other Ambulatory Visit: Payer: Medicare Other

## 2020-10-05 ENCOUNTER — Ambulatory Visit: Payer: Medicare Other

## 2020-10-08 ENCOUNTER — Other Ambulatory Visit: Payer: Self-pay | Admitting: Oncology

## 2020-10-09 ENCOUNTER — Ambulatory Visit: Payer: Medicare Other

## 2020-10-12 ENCOUNTER — Other Ambulatory Visit: Payer: Medicare Other

## 2020-10-12 ENCOUNTER — Ambulatory Visit: Payer: Medicare Other | Admitting: Neurology

## 2020-10-12 ENCOUNTER — Ambulatory Visit: Payer: Medicare Other | Admitting: Hematology

## 2020-10-12 ENCOUNTER — Ambulatory Visit: Payer: Medicare Other

## 2020-10-16 ENCOUNTER — Ambulatory Visit: Payer: Medicare Other

## 2020-10-19 ENCOUNTER — Other Ambulatory Visit: Payer: Medicare Other

## 2020-10-19 ENCOUNTER — Ambulatory Visit: Payer: Medicare Other

## 2020-10-29 DEATH — deceased

## 2020-11-16 ENCOUNTER — Ambulatory Visit: Payer: Medicare Other

## 2020-11-16 ENCOUNTER — Other Ambulatory Visit: Payer: Medicare Other

## 2020-12-06 ENCOUNTER — Telehealth: Payer: Self-pay | Admitting: *Deleted

## 2020-12-06 NOTE — Telephone Encounter (Signed)
Patients spouse called to report that he has a lot of drug Verzenio left over from patients prescription.  Spouse is a Software engineer and states that he has stored the drug in appropriate environment.  Routing to oral pharmacy to see if they are able to accept any unused drug.

## 2021-05-31 ENCOUNTER — Ambulatory Visit: Payer: Medicare Other | Admitting: Obstetrics & Gynecology

## 2022-03-12 IMAGING — DX DG CHEST 1V PORT
1 series · 1 of 1 positions shown · non-contrast
Comparison: 11/23/2017 trauma CT 08/07/2020

CLINICAL DATA: Shortness of breath

EXAM:
PORTABLE CHEST 1 VIEW

[chest ap]
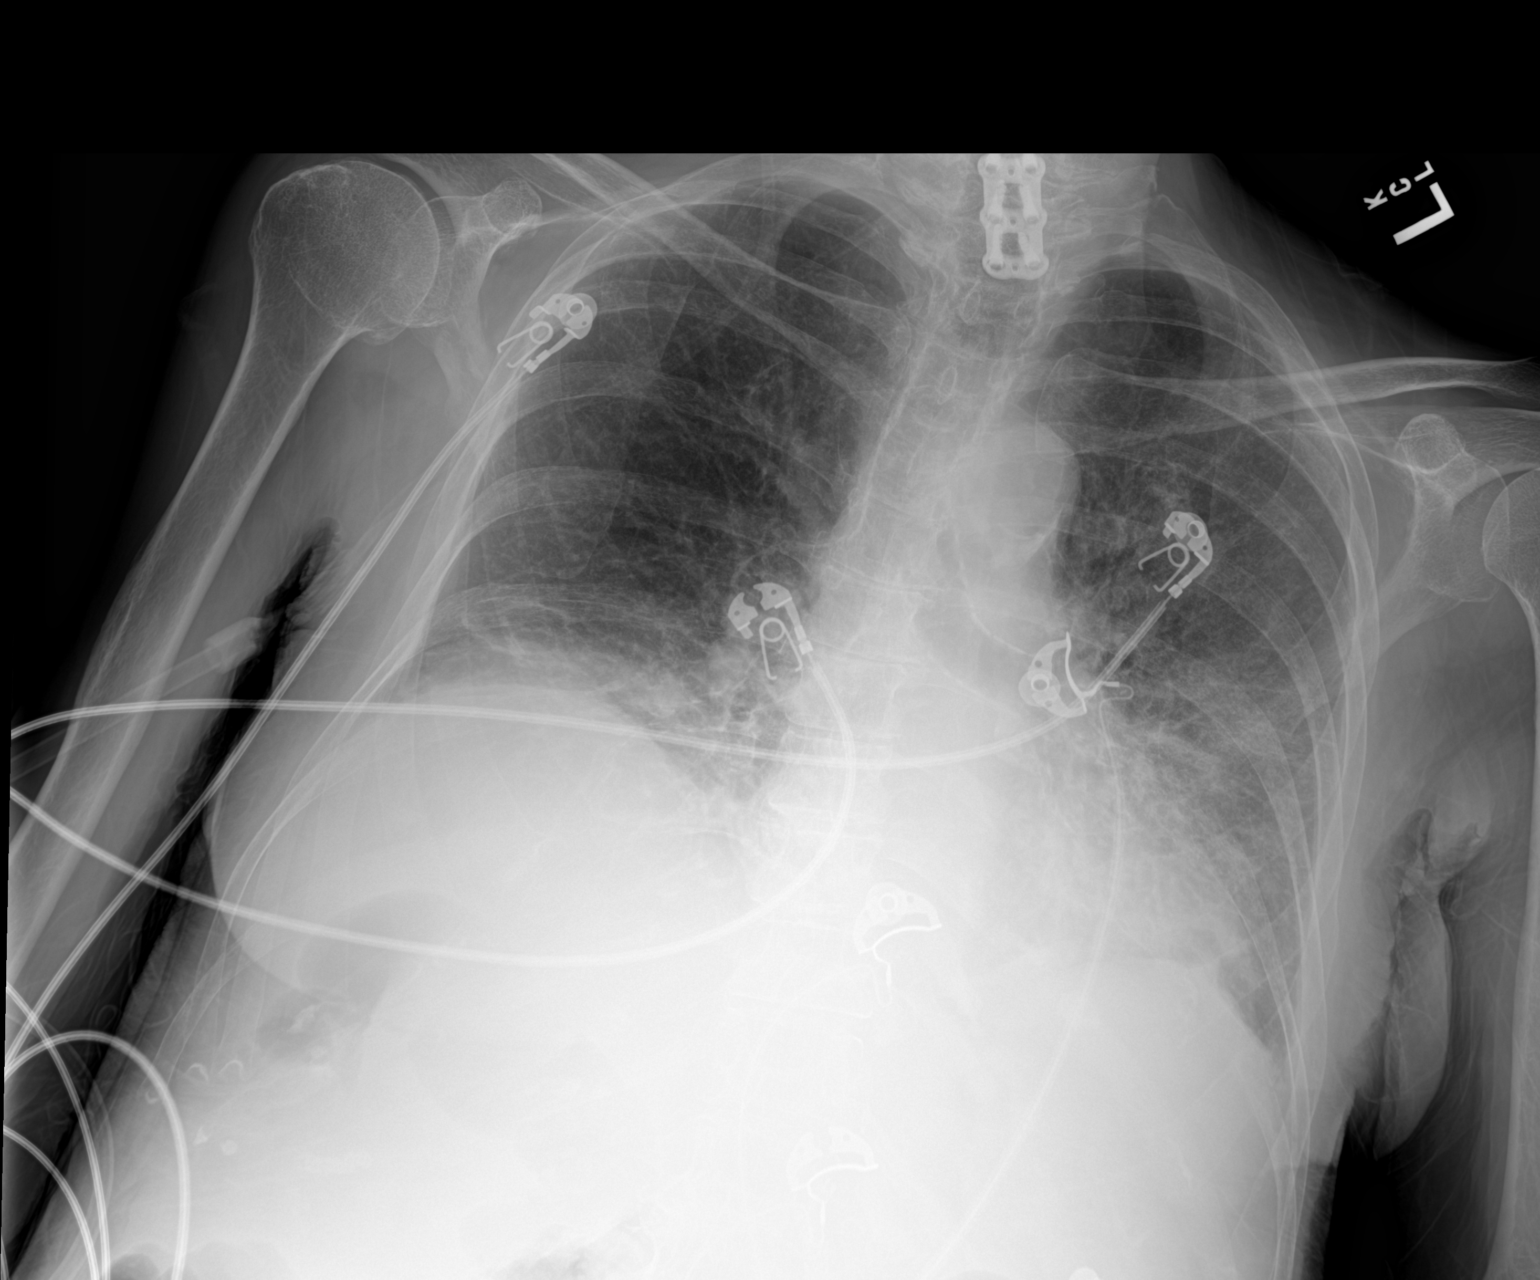

[1 of 1 positions shown; findings below may reference images not displayed]

FINDINGS: Surgical hardware in the cervical spine. Hazy airspace disease in
the lower lungs concerning for pneumonia. Stable cardiomediastinal
silhouette with aortic atherosclerosis. No pneumothorax
IMPRESSION: New hazy mid to lower lung opacity concerning for pneumonia
